# Patient Record
Sex: Male | Born: 1935 | Race: White | Hispanic: No | Marital: Married | Smoking: Former smoker
Health system: Southern US, Community
[De-identification: ages and names within clinical notes are randomized; demographics above are authoritative.]

## PROBLEM LIST (undated history)

## (undated) DIAGNOSIS — K56609 Unspecified intestinal obstruction, unspecified as to partial versus complete obstruction: Secondary | ICD-10-CM

## (undated) DIAGNOSIS — F329 Major depressive disorder, single episode, unspecified: Secondary | ICD-10-CM

## (undated) DIAGNOSIS — F32A Depression, unspecified: Secondary | ICD-10-CM

## (undated) DIAGNOSIS — F0390 Unspecified dementia without behavioral disturbance: Secondary | ICD-10-CM

## (undated) DIAGNOSIS — G20 Parkinson's disease: Secondary | ICD-10-CM

## (undated) DIAGNOSIS — A419 Sepsis, unspecified organism: Secondary | ICD-10-CM

## (undated) DIAGNOSIS — Z7409 Other reduced mobility: Secondary | ICD-10-CM

## (undated) DIAGNOSIS — Z85038 Personal history of other malignant neoplasm of large intestine: Secondary | ICD-10-CM

## (undated) DIAGNOSIS — M1990 Unspecified osteoarthritis, unspecified site: Secondary | ICD-10-CM

## (undated) HISTORY — PX: CATARACT EXTRACTION, BILATERAL: SHX1313

## (undated) HISTORY — PX: HERNIA REPAIR: SHX51

## (undated) HISTORY — PX: OTHER SURGICAL HISTORY: SHX169

## (undated) HISTORY — PX: VASECTOMY: SHX75

## (undated) HISTORY — PX: LUMBAR FUSION: SHX111

## (undated) HISTORY — DX: Personal history of other malignant neoplasm of large intestine: Z85.038

## (undated) HISTORY — PX: TOTAL SHOULDER ARTHROPLASTY: SHX126

## (undated) HISTORY — PX: BACK SURGERY: SHX140

## (undated) HISTORY — PX: SPINAL CORD STIMULATOR IMPLANT: SHX2422

---

## 2004-03-04 ENCOUNTER — Ambulatory Visit (HOSPITAL_COMMUNITY): Admission: RE | Admit: 2004-03-04 | Discharge: 2004-03-04 | Payer: Self-pay | Admitting: Gastroenterology

## 2004-08-09 ENCOUNTER — Ambulatory Visit (HOSPITAL_COMMUNITY): Admission: RE | Admit: 2004-08-09 | Discharge: 2004-08-09 | Payer: Self-pay | Admitting: Chiropractic Medicine

## 2004-10-27 ENCOUNTER — Inpatient Hospital Stay (HOSPITAL_COMMUNITY): Admission: RE | Admit: 2004-10-27 | Discharge: 2004-11-01 | Payer: Self-pay | Admitting: Orthopaedic Surgery

## 2007-07-30 ENCOUNTER — Encounter: Admission: RE | Admit: 2007-07-30 | Discharge: 2007-07-30 | Payer: Self-pay | Admitting: Cardiovascular Disease

## 2007-08-02 ENCOUNTER — Ambulatory Visit (HOSPITAL_COMMUNITY): Admission: RE | Admit: 2007-08-02 | Discharge: 2007-08-02 | Payer: Self-pay | Admitting: Cardiovascular Disease

## 2007-11-05 ENCOUNTER — Inpatient Hospital Stay (HOSPITAL_COMMUNITY): Admission: EM | Admit: 2007-11-05 | Discharge: 2007-11-09 | Payer: Self-pay | Admitting: Emergency Medicine

## 2007-11-09 ENCOUNTER — Inpatient Hospital Stay (HOSPITAL_COMMUNITY)
Admission: RE | Admit: 2007-11-09 | Discharge: 2007-11-23 | Payer: Self-pay | Admitting: Physical Medicine & Rehabilitation

## 2007-11-09 ENCOUNTER — Ambulatory Visit: Payer: Self-pay | Admitting: Physical Medicine & Rehabilitation

## 2008-12-03 ENCOUNTER — Inpatient Hospital Stay (HOSPITAL_COMMUNITY): Admission: RE | Admit: 2008-12-03 | Discharge: 2008-12-05 | Payer: Self-pay | Admitting: Neurosurgery

## 2008-12-10 ENCOUNTER — Inpatient Hospital Stay (HOSPITAL_COMMUNITY): Admission: RE | Admit: 2008-12-10 | Discharge: 2008-12-10 | Payer: Self-pay | Admitting: Neurosurgery

## 2009-10-30 ENCOUNTER — Encounter: Admission: RE | Admit: 2009-10-30 | Discharge: 2009-10-30 | Payer: Self-pay | Admitting: Neurosurgery

## 2009-11-26 ENCOUNTER — Encounter: Admission: RE | Admit: 2009-11-26 | Discharge: 2009-12-15 | Payer: Self-pay | Admitting: Neurosurgery

## 2010-05-18 ENCOUNTER — Encounter
Admission: RE | Admit: 2010-05-18 | Discharge: 2010-07-02 | Payer: Self-pay | Source: Home / Self Care | Attending: Neurosurgery | Admitting: Neurosurgery

## 2010-07-06 ENCOUNTER — Encounter
Admission: RE | Admit: 2010-07-06 | Discharge: 2010-08-03 | Payer: Self-pay | Source: Home / Self Care | Attending: Neurosurgery | Admitting: Neurosurgery

## 2010-07-22 ENCOUNTER — Encounter: Admit: 2010-07-22 | Payer: Self-pay | Admitting: Neurosurgery

## 2010-08-05 ENCOUNTER — Ambulatory Visit: Payer: Medicare Other | Attending: Neurosurgery | Admitting: Physical Therapy

## 2010-08-05 DIAGNOSIS — M545 Low back pain, unspecified: Secondary | ICD-10-CM | POA: Insufficient documentation

## 2010-08-05 DIAGNOSIS — R262 Difficulty in walking, not elsewhere classified: Secondary | ICD-10-CM | POA: Insufficient documentation

## 2010-08-05 DIAGNOSIS — IMO0001 Reserved for inherently not codable concepts without codable children: Secondary | ICD-10-CM | POA: Insufficient documentation

## 2010-08-05 DIAGNOSIS — M6281 Muscle weakness (generalized): Secondary | ICD-10-CM | POA: Insufficient documentation

## 2010-08-09 ENCOUNTER — Encounter: Payer: PRIVATE HEALTH INSURANCE | Admitting: Physical Therapy

## 2010-08-11 ENCOUNTER — Encounter: Payer: PRIVATE HEALTH INSURANCE | Admitting: Physical Therapy

## 2010-08-16 ENCOUNTER — Encounter: Payer: PRIVATE HEALTH INSURANCE | Admitting: Physical Therapy

## 2010-08-18 ENCOUNTER — Encounter: Payer: PRIVATE HEALTH INSURANCE | Admitting: Physical Therapy

## 2010-08-23 ENCOUNTER — Encounter: Payer: PRIVATE HEALTH INSURANCE | Admitting: Physical Therapy

## 2010-08-25 ENCOUNTER — Encounter: Payer: PRIVATE HEALTH INSURANCE | Admitting: Physical Therapy

## 2010-10-11 LAB — CBC
HCT: 35.9 % — ABNORMAL LOW (ref 39.0–52.0)
Hemoglobin: 11.9 g/dL — ABNORMAL LOW (ref 13.0–17.0)
MCHC: 33.2 g/dL (ref 30.0–36.0)
MCV: 93.4 fL (ref 78.0–100.0)
RBC: 3.84 MIL/uL — ABNORMAL LOW (ref 4.22–5.81)

## 2010-10-11 LAB — BASIC METABOLIC PANEL
CO2: 26 mEq/L (ref 19–32)
Chloride: 104 mEq/L (ref 96–112)
GFR calc Af Amer: >60 mL/min (ref >60)
Potassium: 4.1 mEq/L (ref 3.5–5.1)
Sodium: 137 mEq/L (ref 135–145)

## 2010-10-12 LAB — DIFFERENTIAL
Basophils Absolute: 0 10*3/uL (ref 0.0–0.1)
Basophils Relative: 0 % (ref 0–1)
Eosinophils Absolute: 0 10*3/uL (ref 0.0–0.7)
Eosinophils Relative: 1 % (ref 0–5)
Lymphocytes Relative: 17 % (ref 12–46)
Lymphs Abs: 0.9 10*3/uL (ref 0.7–4.0)
Monocytes Absolute: 0.3 10*3/uL (ref 0.1–1.0)
Monocytes Relative: 6 % (ref 3–12)
Neutro Abs: 4 10*3/uL (ref 1.7–7.7)
Neutrophils Relative %: 77 % (ref 43–77)

## 2010-10-12 LAB — CBC
HCT: 37.1 % — ABNORMAL LOW (ref 39.0–52.0)
Hemoglobin: 12.7 g/dL — ABNORMAL LOW (ref 13.0–17.0)
MCHC: 34.2 g/dL (ref 30.0–36.0)
MCV: 93.5 fL (ref 78.0–100.0)
Platelets: 158 10*3/uL (ref 150–400)
RBC: 3.97 MIL/uL — ABNORMAL LOW (ref 4.22–5.81)
RDW: 14.2 % (ref 11.5–15.5)
WBC: 5.2 10*3/uL (ref 4.0–10.5)

## 2010-10-12 LAB — PROTIME-INR
INR: 1.1 (ref 0.00–1.49)
Prothrombin Time: 14.1 seconds (ref 11.6–15.2)

## 2010-10-12 LAB — COMPREHENSIVE METABOLIC PANEL
ALT: 15 U/L (ref 0–53)
AST: 22 U/L (ref 0–37)
Albumin: 4.1 g/dL (ref 3.5–5.2)
Alkaline Phosphatase: 92 U/L (ref 39–117)
BUN: 18 mg/dL (ref 6–23)
CO2: 30 mEq/L (ref 19–32)
Calcium: 9.4 mg/dL (ref 8.4–10.5)
Chloride: 103 mEq/L (ref 96–112)
Creatinine, Ser: 0.87 mg/dL (ref 0.4–1.5)
GFR calc Af Amer: >60 mL/min (ref >60)
GFR calc non Af Amer: >60 mL/min (ref >60)
Glucose, Bld: 102 mg/dL — ABNORMAL HIGH (ref 70–99)
Potassium: 4.6 mEq/L (ref 3.5–5.1)
Sodium: 139 mEq/L (ref 135–145)
Total Bilirubin: 0.8 mg/dL (ref 0.3–1.2)
Total Protein: 6.6 g/dL (ref 6.0–8.3)

## 2010-10-12 LAB — URINALYSIS, ROUTINE W REFLEX MICROSCOPIC
Glucose, UA: NEGATIVE mg/dL
Ketones, ur: NEGATIVE mg/dL
Nitrite: NEGATIVE
Protein, ur: NEGATIVE mg/dL
Urobilinogen, UA: 1 mg/dL (ref 0.0–1.0)

## 2010-10-12 LAB — URINE MICROSCOPIC-ADD ON

## 2010-10-12 LAB — APTT: aPTT: 40 seconds — ABNORMAL HIGH (ref 24–37)

## 2010-10-20 ENCOUNTER — Ambulatory Visit: Payer: Medicare Other | Attending: Neurosurgery

## 2010-10-20 DIAGNOSIS — M545 Low back pain, unspecified: Secondary | ICD-10-CM | POA: Insufficient documentation

## 2010-10-20 DIAGNOSIS — IMO0001 Reserved for inherently not codable concepts without codable children: Secondary | ICD-10-CM | POA: Insufficient documentation

## 2010-10-20 DIAGNOSIS — M6281 Muscle weakness (generalized): Secondary | ICD-10-CM | POA: Insufficient documentation

## 2010-10-20 DIAGNOSIS — R262 Difficulty in walking, not elsewhere classified: Secondary | ICD-10-CM | POA: Insufficient documentation

## 2010-10-28 ENCOUNTER — Ambulatory Visit: Payer: Medicare Other | Admitting: Physical Therapy

## 2010-11-02 ENCOUNTER — Ambulatory Visit: Payer: Medicare Other | Attending: Neurosurgery | Admitting: Physical Therapy

## 2010-11-02 DIAGNOSIS — M545 Low back pain, unspecified: Secondary | ICD-10-CM | POA: Insufficient documentation

## 2010-11-02 DIAGNOSIS — M6281 Muscle weakness (generalized): Secondary | ICD-10-CM | POA: Insufficient documentation

## 2010-11-02 DIAGNOSIS — R262 Difficulty in walking, not elsewhere classified: Secondary | ICD-10-CM | POA: Insufficient documentation

## 2010-11-02 DIAGNOSIS — IMO0001 Reserved for inherently not codable concepts without codable children: Secondary | ICD-10-CM | POA: Insufficient documentation

## 2010-11-04 ENCOUNTER — Ambulatory Visit: Payer: Medicare Other

## 2010-11-09 ENCOUNTER — Ambulatory Visit: Payer: Medicare Other | Admitting: Physical Therapy

## 2010-11-11 ENCOUNTER — Ambulatory Visit: Payer: Medicare Other

## 2010-11-16 ENCOUNTER — Ambulatory Visit: Payer: Medicare Other | Admitting: Physical Therapy

## 2010-11-16 NOTE — H&P (Signed)
NAMETWAIN, STENSETH NO.:  [id number]   MEDICAL RECORD NO.:  [id number]          PATIENT TYPE:  IPS   LOCATION:  4003                         FACILITY:  [facility]   PHYSICIAN:  Ranelle Oyster, M.D.DATE OF BIRTH:  Feb 27, 1924   DATE OF ADMISSION:  11/09/2007  DATE OF DISCHARGE:                              HISTORY & PHYSICAL   CHIEF COMPLAINTS:  1. Weakness.  2. Gait disorder.   HISTORY OF PRESENT ILLNESS:  This is an 77 year old white male with  recent lumbar spine surgery in March 2009.  The patient was  postoperative right lower extremity neuropathy and questionable nerve  root irritation.  Per the patient's account, it was with L5 nerve root.  The patient was discharged to home and initially was ambulating, but  developed orthostatic hypotension with falls, and was placed ProAmatine  by his primary physician.  On Nov 05, 2007, he was admitted with weakness  and questionable UTI with an episode of hematuria on admission.  MRI of  the lumbar spine was unhelpful as multiple artifact was noted from L1-  L5.  Urine culture was negative.  The patient continues with some  weakness and decreased balance and he is admitted to the rehab unit  today.   REVIEW OF SYSTEMS:  Notable for mild insomnia as well as depression.  Other pertinent positives as above and full reviews in a written H and  P.   PAST MEDICAL HISTORY:  1. Positive for colon cancer with resection in the 1980s.  2. Left total shoulder replacement and right shoulder degenerative      changes with decreased range of motion, and potential shoulder      replacement in the future planned.  3. Lumbar decompression in 1979, 1980, and 2006.  4. Lumbar fusion on September 12, 2007, at [place].  5. Depression.  6. Anxiety.  7. History of frequent UTI.   FAMILY HISTORY:  Positive for CAD.   SOCIAL HISTORY:  The patient is married, lives in one-level house, one  step to enter.  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  He does not smoke or drink.  Mobility has been decreased  overall since January of this year.  Wife has been assisting.   FUNCTIONAL HISTORY:  The patient was independent prior to arrival.  Currently is requiring min-to-mod assist for basic mobility and self  care.   ALLERGIES:  1. FENTANYL.  2. PHENERGAN.   HOME MEDICATIONS:  1. Hydrocodone.  2. Keflex.  3. Cymbalta.  4. Colace.  5. Lipitor.  6. Zinc.   LABORATORY DATA:  Hemoglobin 12, white count 4.3, and platelets 165,000.  Sodium 137, potassium 4.3, and BUN and creatinine 10 and 0.71.   PHYSICAL EXAMINATION:  Blood pressure is 163/78, pulse 75, respiratory  rate 20, and temperature 97.5.  The patient is pleasant, alert, and oriented x3.  Pupils are equal, round, and reactive to light.  EAR, NOSE, AND THROAT EXAM: Unremarkable with fair dentition and pink  moist mucosa.  NECK: Supple without JVD or lymphadenopathy.  CHEST: Clear to  auscultation bilaterally without wheezes, rales, or  rhonchi.  HEART: Regular rate and rhythm without murmur, rubs, or gallops.  ABDOMEN: Soft and nontender.  Bowel sounds are positive.  SKIN: Notable for an old abdominal scar.  He has multiple scars from his  recent back surgery in the left back and flank, over the left lateral  pelvis.  NEUROLOGIC: Cranial nerves II through XII are intact.  Reflexes are 1+  in both legs.  Sensation may have been decreased distally in both legs,  however, the L4-L5 areas although did seem to be somewhat variable  today.  Strength was generally 4-5/5 in the upper extremities, except at  the shoulders which were inhibited due to the pain from his arthritis  and problems related to the shoulder replacement.  Lower extremity  strength is 2+ to 3 out of 5 proximal and 4/5 distal.  I saw no specific  focal weakness.  He did have some pain in both quadriceps with palpation  but had preserved active movement of both knees.  Judgment,  orientation,  memory, and mood were all within functional limits.   ASSESSMENT:  1. Functional deficits secondary to lower extremity weakness and pain      as related to the lumbar spondylosis.  The patient is status post      multilevel fusion.  Gait problems, most likely are multifactorial      related to the above as well as deconditioning; poor posture; and      poor orthostasis, etc.  Begin rehab today as the patient's physical      and medical needs cannot be provided at a lesser intensity of the      service.  Physical therapy will assess and treat for range of      motion, strength, balance, and gait.  Occupational therapy will      assess and treat for range of motion, self care, and appropriate      equipment.  Rehab nurse will follow on a 24-hour basis for bowel,      bladder, skin safety, and medications.  Rehab case manager/social      worker assessed for psychosocial needs and discharge planning.      Estimated length of stay is 10-14 days.  Goal supervision to      modified independent.  2. Pain management with p.r.n. Vicodin and/or Tylenol.  3. Deep venous thrombosis prophylaxis, subcutaneous Lovenox.  4. Insomnia:  Low-dose trazodone nightly.  5. Mood:  Continue Cymbalta 60 mg p.o. daily.  6. Orthostasis:  Check the blood pressure and pulse daily x3.  7. Genitourinary:  Observe urine output.  The patient has Foley      discontinued this afternoon.  We will monitor for output and      catheterization as needed.       Ranelle Oyster, M.D.  Electronically Signed     ZTS/MEDQ  D:  11/09/2007  T:  11/10/2007  Job:  [id number]

## 2010-11-16 NOTE — H&P (Signed)
[name], SGRO NO.:  [id number]   MEDICAL RECORD NO.:  [id number]          PATIENT TYPE:  INP   LOCATION:  3001                         FACILITY:  [facility]   PHYSICIAN:  Payton Doughty, M.D.      DATE OF BIRTH:  10-06-1923   DATE OF ADMISSION:  12/10/2008  DATE OF DISCHARGE:  12/10/2008                              HISTORY & PHYSICAL   ADMITTING DIAGNOSIS:  Implanted spinal cord stimulator.   DICTATING DOCTOR:  Payton Doughty, M.D., Neurosurgery.   BODY TEXT:  A very nice 77 year old right-handed white gentleman who  last week underwent implantation of his spinal cord stimulator trial.  He has had a prior L1-L5 fusion, due Kenalog obtained in his right leg.  He elected to proceed with spinal cord stimulator.  His implant has got  good coverage.  He is admitted now for battery implant.   MEDICAL HISTORY:  Remarkable for good health.   MEDICATIONS:  1. Cymbalta.  2. MiraLax.  3. Zaleplon.  4. Hydrocodone.   ALLERGIES:  HE IS ALLERGIC TO FENTANYL PATCHES, PHENERGAN, OXYCODONE,  AND, NEURONTIN.   SURGICAL HISTORY:  Strabismus, vasectomy, frozen shoulder, laminectomy,  total shoulder lumbar fusion and a large fusion in 2009.   SOCIAL HISTORY:  He does not smoke or drink.  He is a Musician.   FAMILY HISTORY:  Mom died at 7 with hypertension and stroke.  Dad died  at 78 with cardiomegaly and 7 brothers have died, one with ALS, one with  lung cancer, one with brain cancer, and four with heart disease.   REVIEW OF SYSTEMS:  Remarkable for glasses, constipation from pain  medication, colon cancer, arm weakness, leg weakness, back pain, arm  pain, leg pain, arthritis, neck pain, skin cancer, anxiety, and  depression.   PHYSICAL EXAMINATION:  HEENT:  Within normal limits.  NECK:  He has good range of motion of his neck.  CHEST:  Clear.  CARDIAC:  Regular rate and rhythm.  ABDOMEN:  Nontender.  No hepatosplenomegaly.  EXTREMITIES:  Without clubbing or  cyanosis.  Peripheral pulses are good.  GU:  Deferred.  NEUROLOGIC:  He is awake, alert, and oriented.  His cranial nerves are  intact.  Motor exam shows 5/5 strength throughout the upper extremities,  described as somewhat frozen shoulder.  Lower extremity strength is good  on the left than the right side.  He has 3/5 dorsiflexion of the right  foot.  Reflexes were absent at the knee, absent at the right ankle, and  one at the left ankle.  He has got an implanted stimulator in place.   CLINICAL IMPRESSION:  Implanted spinal cord stimulator, trial for  battery implant.   The risks and benefits have been discussed with him, and he wished to  proceed.           ______________________________  Payton Doughty, M.D.     MWR/MEDQ  D:  12/10/2008  T:  12/11/2008  Job:  [id number]

## 2010-11-16 NOTE — H&P (Signed)
NAMECELESTER, LECH NO.:  [id number]   MEDICAL RECORD NO.:  [id number]          PATIENT TYPE:  INP   LOCATION:  3027                         FACILITY:  [facility]   PHYSICIAN:  Payton Doughty, M.D.      DATE OF BIRTH:  [birth year]   DATE OF ADMISSION:  12/03/2008  DATE OF DISCHARGE:                              HISTORY & PHYSICAL   ADMISSION DIAGNOSIS:  Intractable back and right lower extremity pain.   BODY OF THE TEXT:  A very nice self-referred 77 year old right-handed  white gentleman, in the last March 2009 had a fusion from L1 to L5 at  [facility] and an L4-5 ALIF, L1-L2 and L2-3 XLIF and pedicle screws joining  it.  Since the time of operation, he had a lot of pain in his right leg.  His back bothers him some.  His right pain is most troublesome.  He has  a bit of foot drop with it.  He has had a couple of myelogram since then  and difficulty with pain in his right leg.   PAST MEDICAL HISTORY:  Marked for good health.   Uses Cymbalta, MiraLax, zinc, Zypan, and hydrocodone.   ALLERGIES:  DURAGESIC PATCHES, PHENERGAN, OXYCODONE, and NEURONTIN.   PAST SURGICAL HISTORY:  1. Strabismus in 1958.  2. Hernia in 1971.  3. Mastectomy in 1972.  4. Frozen shoulder in 1972.  5. Laminectomy in 1979.  6. Laminectomy in 1980.  7. Total shoulder in 2000.  8. Lumbar fusion in 2000.  9. Large fusion in 2009.   SOCIAL HISTORY:  He does not smoke and drinks encroachment.   FAMILY HISTORY:  Mother died at 65 of hypertension, stroke.  Father died  at 59 of cardiomegaly.  He has 7 brothers who have died, one with ALS,  one with lung cancer, one with brain cancer, and fourth heart disease.   REVIEW OF SYSTEMS:  Marked for wearing glasses, constipation from his  pain meds, colon cancer, arm weakness, leg weakness, back pain, arm  pain, leg pain, arthritis, neck pain, skin cancer, anxiety, depression,  and he has had a transfusion.   PHYSICAL EXAMINATION:  HEENT:  Within normal  limits.  NECK:  He has a pretty good range of motion of his neck.  CHEST:  Clear.  CARDIAC:  Regular rate and rhythm at this time.  ABDOMEN:  Nontender.  No hepatosplenomegaly.  EXTREMITIES:  Without clubbing or cyanosis.  GENITOURINARY:  Deferred.  Peripheral pulses are good.  NEUROLOGIC:  He is awake, alert, and oriented.  His cranial nerves are  intact.  Motor exam shows 5/5 strength throughout the upper extremities  by somewhat frozen shoulder.  Lower extremity strength is good on the  left.  On the right side, he has a 3/5 dorsiflexion of the right foot.  Reflexes are absent at the knee, absence at the right ankle, 1 at the  left ankle.   Post-myelogram CT from last November shows reasonable filling lateral  recess narrowing at 4-5 on the right side underneath the side of one of  his old laminectomies, medial  to the side of the pedicle screw in the L5  pedicle.   CLINICAL IMPRESSION:  Right L5 radiculopathy.  We had a long discussion  about decompression of the nerve root, as well as a spinal cord  stimulator for pain management.  He has elected to pursue spinal cord  stimulator.   The plan is to place it at roughly to T10 level and make sure have  coverage to the right side.  The risks and benefits, and progression  have been discussed with him, and he wished to proceed.           ______________________________  Payton Doughty, M.D.     MWR/MEDQ  D:  12/03/2008  T:  12/03/2008  Job:  [id number]

## 2010-11-16 NOTE — Op Note (Signed)
NAMEQUANTRELL, SPLITT NO.:  [id number]   MEDICAL RECORD NO.:  [id number]          PATIENT TYPE:  INP   LOCATION:  3001                         FACILITY:  [facility]   PHYSICIAN:  Payton Doughty, M.D.      DATE OF BIRTH:  Sep 17, 1923   DATE OF PROCEDURE:  12/10/2008  DATE OF DISCHARGE:  12/10/2008                               OPERATIVE REPORT   PREOPERATIVE DIAGNOSIS:  Implanted spinal cord stimulator.   POSTOPERATIVE DIAGNOSIS:  Implanted spinal cord stimulator.   PROCEDURES:  Implant spinal cord stimulator battery.   SURGEON:  Payton Doughty, MD   ANESTHESIA:  General endotracheal.   PREPARATION:  Prepped and draped with alcohol wipe.   COMPLICATIONS:  None.   ASSISTANT:  Bedelia Person, MD   DOCTOR ASSISTANTS:  None.   BODY OF TEXT:  An 77 year old gentleman with intractable back and right  leg pain, had a successful spinal cord trial who is now admitted for  final implantation of battery, taken to operating room and smoothly  anesthetized and intubated, placed prone on the operating table  following shave, prep, and drape in usual sterile fashion.  Old skin  incision was reopened.  The wire was mobilized.  The extension wires  detached, divided and removed.  Just below the right posterior spine,  slightly lateral to it a subcutaneous pouch was created through the skin  incision.  The tunnel was created between 2 incisions and wires passed.  The ion battery was connected to the spinal cord stimulating wires and  implanted in the subcutaneous pocket.  Impedance testing revealed low  impedance, both incisions were irrigated.  They were closed successive  layers of 0 Vicryl, 2-0 Vicryl, 3-0 nylon.  Betadine Telfa dressing was  applied, made occlusive with OpSite.  The patient returned to recovery  room in good condition.           ______________________________  Payton Doughty, M.D.     MWR/MEDQ  D:  12/10/2008  T:  12/11/2008  Job:  [id number]

## 2010-11-16 NOTE — H&P (Signed)
NAMEJHOVANI, Paul Callahan                ACCOUNT NO.:  [id number]   MEDICAL RECORD NO.:  [id number]         PATIENT TYPE:  LINP   LOCATION:                               [facility]ACILITY:  [facility]   PHYSICIAN:  Ladell Pier, M.D.   DATE OF BIRTH:  1924-01-18   DATE OF ADMISSION:  11/05/2007  DATE OF DISCHARGE:                              HISTORY & PHYSICAL   CHIEF COMPLAINT:  Is weakness and multiple falls at home.   HISTORY OF PRESENT ILLNESS:  The patient is a 76 year old white male  that recently has had multiple falls at home.  He recently had spinal  surgery on March 11 at [facility], and per his wife, he had spacers that were  placed down the spine and redo of the previous surgery that was done in  2006.  When he followed up on April 21, he had some pain that was going  on his right leg and was told that it was probably nerve irritation and  should get better.  But since then, he has had some weakness, and he is  scheduled to get an MRI on May 29.  He has had several episodes where he  has fallen without any loss of consciousness, just his legs giving out  at home recently, and he had some hematuria.  He was treated for urinary  tract infection twice, but the symptoms seem to be persistent.  He also  was evaluated by primary care doctor and was thought that the episodes  could be secondary to orthostatic hypotension; that was on March 30.  He  was placed on midodrine for a week and then that was discontinued.  Then, later this week, he has been having more episodes of frequent  falls.  Wife think it is probably related to a bladder infection, but he  has been treated twice for bladder infection.   PAST MEDICAL HISTORY:  Significant for:  1. Back surgery at [facility] September 12, 2007.  2. Back surgery at [facility] where he had anterior L3-L4 laminectomy      with posterior spinal fusion and pedicle screws in April 2006.  3. Cardiac catheterization that was negative in January 2009.  4. History of  anxiety, depression.  5. Colon resection secondary to colon cancer.  6. Back surgery 1979 and 1980.  7. Total shoulder replacement in 2002.  8. Vasectomy in 1988.  9. Multiple recent UTIs.  10.History of orthostatic hypotension recently.   FAMILY HISTORY:  Noncontributory.   SOCIAL HISTORY:  The patient is married, lives with his wife.  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  He has three children.   MEDICATIONS:  1. Hydrocodone/acetaminophen 3 times a day as needed.  2. Cephalexin 500 mg 3 times a day.  3. Colace 100 mg daily.  4. Cymbalta 60 mg daily.  5. Lipitor 20 mg every night.   ALLERGIES:  FENTANYL AND PHENERGAN.   REVIEW OF SYSTEMS:  Negative, otherwise as in HPI.   PHYSICAL EXAMINATION:  Temperature 98.5, blood pressure 109/66, pulse of  83, respirations 20, pulse  oximetry 97 on room air.  HEENT:  Normocephalic, atraumatic.  Pupils reactive to light.  Throat  without erythema.  CARDIOVASCULAR:  Regular rhythm.  LUNGS:  Clear bilaterally.  ABDOMEN:  Soft, nontender, nondistended.  Positive bowel sounds.  EXTREMITIES:  Without edema.  The left seem to be larger than the right  slightly.  He does have 2 beats of clonus on the left.  Otherwise, his neurological  exam is nonfocal   LABORATORY DATA:  WBC 4.3, hemoglobin 12, MCV of 91.7, platelets of 165.  UA negative, trace leukocyte.  Sodium 137, potassium 4.3, chloride 103,  CO2 29, glucose 97, BUN 10, creatinine 0.71, calcium 9.2.   ASSESSMENT/PLAN:  Weakness, multiple falls.  The patient is probably  having falls secondary to nerve compression in the back with his recent  back surgery.  Everything else seems fine.  He does not have a white  count.  He does not have any electrolyte abnormalities.  Urine seems  fine.  Will get an MRI of the C spine and LS spine and will also check  orthostatic.  Will check a D-dimer to rule out any sort of PE or DVT,  with his swelling more increased on the left side,  although wife states  that he has been having swelling on the right side.  Will do oxycodone  or Dilaudid p.r.n. for pain.  Continue him on all his other home  medications.      Ladell Pier, M.D.  Electronically Signed     NJ/MEDQ  D:  11/05/2007  T:  11/05/2007  Job:  [id number]

## 2010-11-16 NOTE — Cardiovascular Report (Signed)
NAMEJERMARCUS, Paul Callahan NO.:  [id number]   MEDICAL RECORD NO.:  [id number]          PATIENT TYPE:  OIB   LOCATION:  2899                         FACILITY:  [facility]   PHYSICIAN:  Nanetta Batty, M.D.   DATE OF BIRTH:  1924/01/29   DATE OF PROCEDURE:  DATE OF DISCHARGE:                            CARDIAC CATHETERIZATION   Paul Callahan is an 77 year old married white male, history of non-critical CAD  by cath at [facility] in [place] 3 years ago.  He had  normal Cardiolite March 18, 2004.  His other problems include  hyperlipidemia.  He denies chest pain, shortness of breath.  Does have  degenerative disk disease and is scheduled for back surgery at [facility].  A  Myoview stress test performed for preoperative clearance suggested new  ischemia in the RCA distribution.  He presents now for outpatient  diagnostic coronary arteriography, to define his anatomy and rule out  ischemic etiology with risk stratifying.   DESCRIPTION OF PROCEDURE:  The patient brought to the second floor [facility]  of cardiac cath lab in the postabsorptive state.  He was pre-medicated  with p.o. Valium.   His right groin was prepped and shaved in the usual sterile fashion.  Xylocaine 1% was used for local anesthesia.  A 6-French sheath was  inserted to the right femoral artery, using standard Seldinger  technique.  A 6-French right and left Judkins' diagnostic catheter, as  well as a [place] pigtail catheter were used for selective coronary  angiography, left ventriculography respectively.  Visipaque dye was used  for the entirety of the case.  Retrograde aorta, ventricular and  pullback pressures were recorded.   HEMODYNAMIC RESULTS:  1. Aortic systolic pressure 172, diastolic pressure 70.  2. Left ventricular systolic pressure 177, end-diastolic pressure 16.      There is no apparent pullback gradient noted.   SELECTIVE CORONARY ANGIOGRAPHY:  1. Left main normal.  2. LAD normal.  3. Left  circumflex is normal.  4. Ramus branch was normal.  5. Right coronary artery is dominant normal.   LEFT VENTRICULOGRAPHY:  RAO left ventriculogram was performed using 25  mL of Visipaque dye at 12 mL per second.  The overall LVEF was estimated  greater than 60%, without focal wall motion abnormalities.   IMPRESSION:  Paul Callahan has essentially normal coronary arteries and normal LV  function.  I believe his Myoview was false positive.  He is cleared for  his upcoming neurosurgical procedure at low cardiovascular risk.   Sheath was removed, and pressure was held to the groin to achieve  hemostasis.  The patient left the lab in stable condition.  He will be  discharged home later today as an outpatient.  I will see him back in  the office in one week for followup.      Nanetta Batty, M.D.  Electronically Signed     JB/MEDQ  D:  08/02/2007  T:  08/02/2007  Job:  [id number]   cc:   Patient Chart  2nd Floor [facility]  [facility]  Frederica Kuster, MD

## 2010-11-16 NOTE — Discharge Summary (Signed)
NAMESLAYTON, LUBITZ                ACCOUNT NO.:  [id number]   MEDICAL RECORD NO.:  [id number]          PATIENT TYPE:  INP   LOCATION:  1445                         FACILITY:  [facility]   PHYSICIAN:  Mobolaji B. Bakare, M.D.DATE OF BIRTH:  03-Jan-1924   DATE OF ADMISSION:  11/05/2007  DATE OF DISCHARGE:  11/09/2007                               DISCHARGE SUMMARY   PRIMARY CARE PHYSICIAN:  Unassigned.   FINAL DIAGNOSES:  1. Bilateral lower extremity weakness.  2. Status post spinal surgery (L1 through L5 fusion at [facility] in March 2009).  3. Frequent falls.   SECONDARY DIAGNOSES:  1. History of anxiety.  2. History of depression.  3. Colon resection for colon cancer.  4. Multiple back surgeries.   PROCEDURES:  1. Chest x-ray done on Nov 05, 2007 showed no acute cardiopulmonary      process, hyperinflation suggesting.  COPD.  2. MRI of the lumbar spine showed fusion of L1 to 5 with metallic      artifact limiting evaluation.  Spinal stenosis most notable at L2-      3.   BRIEF HISTORY:  Please refer to the admission H&P for full details.  In  brief, Mr. Slawinski is an 77 year old Caucasian male with history of  recent spinal surgery on September 12, 2007, at [facility].  He  presented to the emergency room with weakness and multiple falls at  home.  The patient apparently has had evaluation in the outpatient  setting by his primary care physician for falls.  He appeared to have  had some orthostatic hypotension and was given midodrine for about a  week and this has since been discontinued.  The patient was not  orthostatic during this hospitalization.  He had a nonfocal neurological  examination at the time of admission.   Laboratory data showed a normal white cell count.  His chemistry was  normal.  Chest x-ray was unremarkable.   HOSPITAL COURSE:  The patient was admitted for further evaluation.  MRI  of the lumbar spine was unremarkable for acute  abnormality.  The report  indicated mild to moderate spinal stenosis most notable at L2-3.  The  patient was seen by a physical therapist.  The patient appears to have a  right foot drop and wife states that he has had significant decline in  his gait since 1 week post discharge from [facility].  The  patient was discussed with Dr. Manson Passey at [facility], and he recommended the  patient should follow up him in 1 week post discharge.  The patient  needs intensive physical therapy.  He was seen by inpatient rehab unit  and he is felt to be appropriate for inpatient rehabilitation.  Further  evaluation will be deferred to his primary care physician and surgeons  at [facility] post discharge from inpatient rehabilitation.  It  appears that the recent frequent falls have been occurring post back  surgery.  It is quite possible that he is debilitated post procedure and  needs intensive rehabilitation.  There  is no acute finding on the MRI of  the lumbar spine, no obvious bony destruction or lesion to suggest  metastatic disease.   DISCHARGE MEDICATIONS:  1. Cymbalta 60 mg p.o. daily.  2. Protonix 40 mg p.o. daily.  3. Senokot 1 tablet nightly.  4. Zocor 40 mg nightly.  5. Vicodin 1 tablet p.o. q.6 h. p.r.n..  6. Trazodone 25 mg p.o. nightly p.r.n. sleep.  7. MiraLax 17 g p.o. daily constipation.  8. Zofran 4 mg IV q.8 h. p.r.n. nausea and vomiting.  9. Colace 100 times daily.   FOLLOW-UP INSTRUCTIONS:  1. Follow up with Dr. Manson Passey at [facility] in one week post      discharge from rehab.  2. Follow up with primary care physician.      Mobolaji B. Corky Downs, M.D.  Electronically Signed     MBB/MEDQ  D:  11/09/2007  T:  11/09/2007  Job:  [id number]

## 2010-11-16 NOTE — Op Note (Signed)
Paul Callahan, Paul Callahan NO.:  [id number]   MEDICAL RECORD NO.:  [id number]          PATIENT TYPE:  INP   LOCATION:  3027                         FACILITY:  [facility]   PHYSICIAN:  Payton Doughty, M.D.      DATE OF BIRTH:  30-Nov-1923   DATE OF PROCEDURE:  12/03/2008  DATE OF DISCHARGE:                               OPERATIVE REPORT   PREOPERATIVE DIAGNOSIS:  Intractable back and right leg pain.   POSTOPERATIVE DIAGNOSIS:  Intractable back and right leg pain.   PROCEDURE:  Placement of spinal cord stimulating electrode.   SURGEON:  Payton Doughty, MD   ANESTHESIA:  General endotracheal.   PREPARATION:  Prepped and draped with alcohol wipe.   COMPLICATIONS:  None.   ASSISTANT:  None.   BODY OF TEXT:  An 77 year old with intractable back and right leg pain  taken to the operative room, smoothly anesthetized, intubated, and  placed prone on the operating table.  Following shave, prep, and drape  in the usual sterile fashion, skin was incised from the bottom of T10 to  the bottom of T11.  The lamina of T11 was exposed bilaterally in the  subperiosteal plane.  Intraoperative x-ray confirmed correctness.  Having confirmed correctness level, partial laminectomy at T11 was done  to acquire the posterior epidural space.  Stimulating electrode tripolar  was placed.  It was advanced to the bottom of the T10 interspace and  extended up into the bottom of T9.  This way T10 and the bottom T9  recovered and felt to be appropriate for stimulating.  Anchoring was  undertaken and the extension wires were placed.  They were exited via  separate trocar incision.  Wound was irrigated.  Hemostasis assured.  Successive layers of 0-Vicryl, 2-0 Vicryl, and 3-0 nylon were used to  close.  Betadine and Telfa dressing was applied and made occlusive with  OpSite.  The patient returned to the recovery room in good condition.           ______________________________  Payton Doughty, M.D.     MWR/MEDQ  D:  12/03/2008  T:  12/03/2008  Job:  [id number]

## 2010-11-18 ENCOUNTER — Ambulatory Visit: Payer: Medicare Other | Admitting: Physical Therapy

## 2010-11-19 NOTE — Discharge Summary (Signed)
[name], [name] NO.:  [id number]   MEDICAL RECORD NO.:  [id number]          PATIENT TYPE:  IPS   LOCATION:  4003                         FACILITY:  [facility]   PHYSICIAN:  Paul Callahan, Paul CallahanDATE OF BIRTH:  [birth year]   DATE OF ADMISSION:  11/09/2007  DATE OF DISCHARGE:  11/23/2007                               DISCHARGE SUMMARY   DISCHARGE DIAGNOSES:  1. Lumbar stenosis, past laminectomy with deconditioning.  2. Orthostasis, improved.  3. Situational depression.   HISTORY OF PRESENT ILLNESS:  Paul Callahan is an 77 year old male with  history of recent back surgery on March 11 with postop right lower  extremity neuropathy, secondary to nerve irritation discharged to home  and was ambulating initially, but developed orthostatic hypotension with  falls despite being placed on ProAmatine.  On May 4, he was admitted  with weakness and question of UTI.  The patient was noted to have  episode of hematuria past admission.  MRI of the spine done showed L1-L5  metal artifact with mild-to-moderate stenosis L2, L3.  Urine cultures  done on 5/4 and 5/5, which showed no growth.  Currently, the patient  continues with bilateral lower body weakness with decrease in balance,  noted to have depressed mood.  Rehab consulted for further therapies.   PAST MEDICAL HISTORY:  Significant for colon cancer resection in the  80s, left total shoulder replacement, right shoulder degeneration with  decreased range of motion, lumbar decompression in 1979, 1980, 2006 as  well as recently on 09/2007.  Depression, anxiety, and frequent UTIs.   ALLERGIES:  FENTANYL and PHENERGAN.   FAMILY HISTORY:  Positive for coronary artery disease.   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  Then was noted to have decrease in mobility  secondary to back pain.  He has been using a  walker since then.  His  family is very supportive.   HOSPITAL COURSE:  Paul Callahan was admitted to rehab on Nov 10, 2007,  for inpatient therapies to consist of PT, OT daily.   PAST ADMISSION:  The patient's blood pressures were monitored on b.i.d.  basis.  He was noted to have issues with orthostasis and thigh-high TEDs  as well as abdominal binder was ordered to help with his symptomatology.  Blood pressures by time of discharge are ranging at 120s to 130s,  systolic and 70s to 80s diastolic.  The patient's pain control has been  reasonable on p.r.n. use of Ultram and Vicodin.  He does continue with  complaints of neuropathy in his right lower extremity.  Secondary to  patient's depressed new mood, Dr. Leonides Cave, neuropsych was consulted for  input.  His exam revealed the patient with negative attitude and  discontent regarding recent situation.  Dr. Leonides Cave has been following  the patient along for support during this stay.  By the time of  discharge, the patient reported feeling more progress that he had made  in therapies.  Labs were done in past admission are essentially normal.  CBC done  revealed hemoglobin 4.0, hematocrit 35.8, white count 4.3, platelets  194.  Electrolytes reveals sodium 140, potassium 4.5, chloride 105, CO2  of 31, BUN 17, creatinine 0.7, glucose 109.  A UA UC was rechecked.  UA  was negative without evidence of microhematuria.  Urine culture showed  no growth.  The patient has been afebrile during this stay.  Therapies  have been ongoing and initially, the patient with complaints of  dizziness, which have been resolved.  By the time of discharge, the  patient had progressed along to supervision level for bed mobility and  basic car transfers as well as ambulation treatment 150 feet in  controlled environment.  He still continued with significant right lower  extremity pain and weakness as well as right shoulder pain with  decreased balance limiting his  gait and ambulation tested.  In terms of  self-care, the patient has been assisted supervision for BADL using  Adaptive equipment.  He continued to have difficulty with safety  precautions during self-care.  OT has continued to work on activity  tolerance as well as dynamic standing balance for functional tasks to  help improve his safety awareness secondary to balance issues.  The  patient's 24-hour supervision is recommended prior to discharge.  Further followup home health PT, OT as well as a CNA by advanced home  care set up to continue progressive therapies.  On Nov 23, 2007, the  patient was discharged to home.   DISCHARGE MEDICATIONS:  1. Cymbalta 30 mg 2 p.o. per day.  2. Lipitor 20 mg 1 p.o. nightly.  3. Trazodone 50 mg half p.o. nightly.  4. Prilosec OTC 20 mg 1 per day.  5. Multivitamin once a day.  6. Ultram/hydrocodone to be used as needed p.r.n. pain.   DIET:  Heart healthy.  Drink plenty of fluids.   ACTIVITY LEVEL:  24-hour supervision.  No strenuous activity.  No  alcohol, no smoking or driving.  Special instructions with abdominal  binder and TEDs and out of bed to maintain blood pressure.   FOLLOW UP:  The patient to follow up with Dr. Linward Foster for routine  check.  Follow up with Dr. Erroll Luna for postop check.  Follow  up with Dr. Riley Kill as needed.      Greg Cutter, P.A.      Paul Callahan, M.D.  Electronically Signed    PP/MEDQ  D:  11/28/2007  T:  11/29/2007  Job:  [id number]   cc:   Estelle June. Neva Seat, M.D.

## 2010-11-24 ENCOUNTER — Ambulatory Visit: Payer: Medicare Other | Admitting: Physical Therapy

## 2010-11-25 ENCOUNTER — Ambulatory Visit: Payer: Medicare Other

## 2011-06-18 DIAGNOSIS — R269 Unspecified abnormalities of gait and mobility: Secondary | ICD-10-CM | POA: Diagnosis not present

## 2011-06-18 DIAGNOSIS — I10 Essential (primary) hypertension: Secondary | ICD-10-CM | POA: Diagnosis not present

## 2011-06-18 DIAGNOSIS — G20 Parkinson's disease: Secondary | ICD-10-CM | POA: Diagnosis not present

## 2011-06-18 DIAGNOSIS — M216X9 Other acquired deformities of unspecified foot: Secondary | ICD-10-CM | POA: Diagnosis not present

## 2011-06-20 DIAGNOSIS — G20 Parkinson's disease: Secondary | ICD-10-CM | POA: Diagnosis not present

## 2011-06-20 DIAGNOSIS — R269 Unspecified abnormalities of gait and mobility: Secondary | ICD-10-CM | POA: Diagnosis not present

## 2011-06-20 DIAGNOSIS — I10 Essential (primary) hypertension: Secondary | ICD-10-CM | POA: Diagnosis not present

## 2011-06-20 DIAGNOSIS — M216X9 Other acquired deformities of unspecified foot: Secondary | ICD-10-CM | POA: Diagnosis not present

## 2011-06-21 DIAGNOSIS — R269 Unspecified abnormalities of gait and mobility: Secondary | ICD-10-CM | POA: Diagnosis not present

## 2011-06-21 DIAGNOSIS — I10 Essential (primary) hypertension: Secondary | ICD-10-CM | POA: Diagnosis not present

## 2011-06-21 DIAGNOSIS — M216X9 Other acquired deformities of unspecified foot: Secondary | ICD-10-CM | POA: Diagnosis not present

## 2011-06-21 DIAGNOSIS — G20 Parkinson's disease: Secondary | ICD-10-CM | POA: Diagnosis not present

## 2011-06-23 DIAGNOSIS — R269 Unspecified abnormalities of gait and mobility: Secondary | ICD-10-CM | POA: Diagnosis not present

## 2011-06-23 DIAGNOSIS — M216X9 Other acquired deformities of unspecified foot: Secondary | ICD-10-CM | POA: Diagnosis not present

## 2011-06-23 DIAGNOSIS — I10 Essential (primary) hypertension: Secondary | ICD-10-CM | POA: Diagnosis not present

## 2011-06-23 DIAGNOSIS — G20 Parkinson's disease: Secondary | ICD-10-CM | POA: Diagnosis not present

## 2011-06-24 DIAGNOSIS — G20 Parkinson's disease: Secondary | ICD-10-CM | POA: Diagnosis not present

## 2011-06-24 DIAGNOSIS — M216X9 Other acquired deformities of unspecified foot: Secondary | ICD-10-CM | POA: Diagnosis not present

## 2011-06-24 DIAGNOSIS — R269 Unspecified abnormalities of gait and mobility: Secondary | ICD-10-CM | POA: Diagnosis not present

## 2011-06-24 DIAGNOSIS — I10 Essential (primary) hypertension: Secondary | ICD-10-CM | POA: Diagnosis not present

## 2011-06-27 DIAGNOSIS — R269 Unspecified abnormalities of gait and mobility: Secondary | ICD-10-CM | POA: Diagnosis not present

## 2011-06-27 DIAGNOSIS — I10 Essential (primary) hypertension: Secondary | ICD-10-CM | POA: Diagnosis not present

## 2011-06-27 DIAGNOSIS — M216X9 Other acquired deformities of unspecified foot: Secondary | ICD-10-CM | POA: Diagnosis not present

## 2011-06-27 DIAGNOSIS — G20 Parkinson's disease: Secondary | ICD-10-CM | POA: Diagnosis not present

## 2011-06-29 DIAGNOSIS — M216X9 Other acquired deformities of unspecified foot: Secondary | ICD-10-CM | POA: Diagnosis not present

## 2011-06-29 DIAGNOSIS — G20 Parkinson's disease: Secondary | ICD-10-CM | POA: Diagnosis not present

## 2011-06-29 DIAGNOSIS — I10 Essential (primary) hypertension: Secondary | ICD-10-CM | POA: Diagnosis not present

## 2011-06-29 DIAGNOSIS — R269 Unspecified abnormalities of gait and mobility: Secondary | ICD-10-CM | POA: Diagnosis not present

## 2011-06-30 DIAGNOSIS — G20 Parkinson's disease: Secondary | ICD-10-CM | POA: Diagnosis not present

## 2011-06-30 DIAGNOSIS — I10 Essential (primary) hypertension: Secondary | ICD-10-CM | POA: Diagnosis not present

## 2011-06-30 DIAGNOSIS — M216X9 Other acquired deformities of unspecified foot: Secondary | ICD-10-CM | POA: Diagnosis not present

## 2011-06-30 DIAGNOSIS — R269 Unspecified abnormalities of gait and mobility: Secondary | ICD-10-CM | POA: Diagnosis not present

## 2011-07-01 DIAGNOSIS — G20 Parkinson's disease: Secondary | ICD-10-CM | POA: Diagnosis not present

## 2011-07-01 DIAGNOSIS — R269 Unspecified abnormalities of gait and mobility: Secondary | ICD-10-CM | POA: Diagnosis not present

## 2011-07-01 DIAGNOSIS — I10 Essential (primary) hypertension: Secondary | ICD-10-CM | POA: Diagnosis not present

## 2011-07-01 DIAGNOSIS — M216X9 Other acquired deformities of unspecified foot: Secondary | ICD-10-CM | POA: Diagnosis not present

## 2011-07-04 DIAGNOSIS — R269 Unspecified abnormalities of gait and mobility: Secondary | ICD-10-CM | POA: Diagnosis not present

## 2011-07-04 DIAGNOSIS — M216X9 Other acquired deformities of unspecified foot: Secondary | ICD-10-CM | POA: Diagnosis not present

## 2011-07-04 DIAGNOSIS — I10 Essential (primary) hypertension: Secondary | ICD-10-CM | POA: Diagnosis not present

## 2011-07-04 DIAGNOSIS — G20 Parkinson's disease: Secondary | ICD-10-CM | POA: Diagnosis not present

## 2011-07-06 DIAGNOSIS — G20 Parkinson's disease: Secondary | ICD-10-CM | POA: Diagnosis not present

## 2011-07-06 DIAGNOSIS — I10 Essential (primary) hypertension: Secondary | ICD-10-CM | POA: Diagnosis not present

## 2011-07-06 DIAGNOSIS — R269 Unspecified abnormalities of gait and mobility: Secondary | ICD-10-CM | POA: Diagnosis not present

## 2011-07-06 DIAGNOSIS — M216X9 Other acquired deformities of unspecified foot: Secondary | ICD-10-CM | POA: Diagnosis not present

## 2011-07-07 DIAGNOSIS — I10 Essential (primary) hypertension: Secondary | ICD-10-CM | POA: Diagnosis not present

## 2011-07-07 DIAGNOSIS — R269 Unspecified abnormalities of gait and mobility: Secondary | ICD-10-CM | POA: Diagnosis not present

## 2011-07-07 DIAGNOSIS — M216X9 Other acquired deformities of unspecified foot: Secondary | ICD-10-CM | POA: Diagnosis not present

## 2011-07-07 DIAGNOSIS — G20 Parkinson's disease: Secondary | ICD-10-CM | POA: Diagnosis not present

## 2011-07-08 DIAGNOSIS — M216X9 Other acquired deformities of unspecified foot: Secondary | ICD-10-CM | POA: Diagnosis not present

## 2011-07-08 DIAGNOSIS — I10 Essential (primary) hypertension: Secondary | ICD-10-CM | POA: Diagnosis not present

## 2011-07-08 DIAGNOSIS — G20 Parkinson's disease: Secondary | ICD-10-CM | POA: Diagnosis not present

## 2011-07-08 DIAGNOSIS — R269 Unspecified abnormalities of gait and mobility: Secondary | ICD-10-CM | POA: Diagnosis not present

## 2011-08-30 DIAGNOSIS — F332 Major depressive disorder, recurrent severe without psychotic features: Secondary | ICD-10-CM | POA: Diagnosis not present

## 2011-08-31 DIAGNOSIS — L719 Rosacea, unspecified: Secondary | ICD-10-CM | POA: Diagnosis not present

## 2011-09-09 DIAGNOSIS — G20 Parkinson's disease: Secondary | ICD-10-CM | POA: Diagnosis not present

## 2011-09-22 ENCOUNTER — Ambulatory Visit: Payer: Medicare Other | Attending: Psychiatry | Admitting: Physical Therapy

## 2011-09-22 DIAGNOSIS — R269 Unspecified abnormalities of gait and mobility: Secondary | ICD-10-CM | POA: Diagnosis not present

## 2011-09-22 DIAGNOSIS — R5381 Other malaise: Secondary | ICD-10-CM | POA: Diagnosis not present

## 2011-09-22 DIAGNOSIS — M6281 Muscle weakness (generalized): Secondary | ICD-10-CM | POA: Insufficient documentation

## 2011-09-22 DIAGNOSIS — R293 Abnormal posture: Secondary | ICD-10-CM | POA: Insufficient documentation

## 2011-09-22 DIAGNOSIS — IMO0001 Reserved for inherently not codable concepts without codable children: Secondary | ICD-10-CM | POA: Insufficient documentation

## 2011-09-29 DIAGNOSIS — L821 Other seborrheic keratosis: Secondary | ICD-10-CM | POA: Diagnosis not present

## 2011-09-29 DIAGNOSIS — D239 Other benign neoplasm of skin, unspecified: Secondary | ICD-10-CM | POA: Diagnosis not present

## 2011-09-29 DIAGNOSIS — L570 Actinic keratosis: Secondary | ICD-10-CM | POA: Diagnosis not present

## 2011-10-03 ENCOUNTER — Ambulatory Visit: Payer: Medicare Other | Attending: Psychiatry | Admitting: Physical Therapy

## 2011-10-03 DIAGNOSIS — R269 Unspecified abnormalities of gait and mobility: Secondary | ICD-10-CM | POA: Insufficient documentation

## 2011-10-03 DIAGNOSIS — M6281 Muscle weakness (generalized): Secondary | ICD-10-CM | POA: Diagnosis not present

## 2011-10-03 DIAGNOSIS — R5381 Other malaise: Secondary | ICD-10-CM | POA: Diagnosis not present

## 2011-10-03 DIAGNOSIS — IMO0001 Reserved for inherently not codable concepts without codable children: Secondary | ICD-10-CM | POA: Diagnosis not present

## 2011-10-03 DIAGNOSIS — R293 Abnormal posture: Secondary | ICD-10-CM | POA: Diagnosis not present

## 2011-10-04 ENCOUNTER — Ambulatory Visit: Payer: Medicare Other | Admitting: Physical Therapy

## 2011-10-05 ENCOUNTER — Ambulatory Visit: Payer: Medicare Other | Admitting: Physical Therapy

## 2011-10-07 ENCOUNTER — Ambulatory Visit: Payer: Medicare Other | Admitting: Physical Therapy

## 2011-10-10 ENCOUNTER — Ambulatory Visit: Payer: Medicare Other | Admitting: Physical Therapy

## 2011-10-11 ENCOUNTER — Ambulatory Visit: Payer: Medicare Other | Admitting: Physical Therapy

## 2011-10-12 ENCOUNTER — Ambulatory Visit: Payer: Medicare Other | Admitting: Physical Therapy

## 2011-10-13 ENCOUNTER — Ambulatory Visit: Payer: Medicare Other | Admitting: Physical Therapy

## 2011-10-17 ENCOUNTER — Ambulatory Visit: Payer: Medicare Other | Admitting: Physical Therapy

## 2011-10-18 ENCOUNTER — Ambulatory Visit: Payer: Medicare Other | Admitting: Physical Therapy

## 2011-10-19 ENCOUNTER — Ambulatory Visit: Payer: Medicare Other | Admitting: Physical Therapy

## 2011-10-20 ENCOUNTER — Ambulatory Visit: Payer: Medicare Other | Admitting: Physical Therapy

## 2011-10-24 ENCOUNTER — Ambulatory Visit: Payer: Medicare Other | Admitting: Physical Therapy

## 2011-10-25 ENCOUNTER — Ambulatory Visit: Payer: Medicare Other | Admitting: Physical Therapy

## 2011-10-26 ENCOUNTER — Ambulatory Visit: Payer: Medicare Other | Admitting: Physical Therapy

## 2011-10-27 ENCOUNTER — Ambulatory Visit: Payer: Medicare Other | Admitting: Physical Therapy

## 2011-10-31 ENCOUNTER — Ambulatory Visit: Payer: Medicare Other | Admitting: Physical Therapy

## 2011-11-02 ENCOUNTER — Ambulatory Visit: Payer: Medicare Other | Attending: Psychiatry | Admitting: Physical Therapy

## 2011-11-02 DIAGNOSIS — R5381 Other malaise: Secondary | ICD-10-CM | POA: Insufficient documentation

## 2011-11-02 DIAGNOSIS — R269 Unspecified abnormalities of gait and mobility: Secondary | ICD-10-CM | POA: Diagnosis not present

## 2011-11-02 DIAGNOSIS — R293 Abnormal posture: Secondary | ICD-10-CM | POA: Insufficient documentation

## 2011-11-02 DIAGNOSIS — M6281 Muscle weakness (generalized): Secondary | ICD-10-CM | POA: Insufficient documentation

## 2011-11-02 DIAGNOSIS — IMO0001 Reserved for inherently not codable concepts without codable children: Secondary | ICD-10-CM | POA: Diagnosis not present

## 2011-11-03 DIAGNOSIS — F0280 Dementia in other diseases classified elsewhere without behavioral disturbance: Secondary | ICD-10-CM | POA: Diagnosis not present

## 2011-11-03 DIAGNOSIS — G20 Parkinson's disease: Secondary | ICD-10-CM | POA: Diagnosis not present

## 2011-11-03 DIAGNOSIS — G609 Hereditary and idiopathic neuropathy, unspecified: Secondary | ICD-10-CM | POA: Diagnosis not present

## 2011-11-07 ENCOUNTER — Ambulatory Visit: Payer: Medicare Other | Admitting: Physical Therapy

## 2011-11-07 DIAGNOSIS — R293 Abnormal posture: Secondary | ICD-10-CM | POA: Diagnosis not present

## 2011-11-07 DIAGNOSIS — R269 Unspecified abnormalities of gait and mobility: Secondary | ICD-10-CM | POA: Diagnosis not present

## 2011-11-07 DIAGNOSIS — R5381 Other malaise: Secondary | ICD-10-CM | POA: Diagnosis not present

## 2011-11-07 DIAGNOSIS — M6281 Muscle weakness (generalized): Secondary | ICD-10-CM | POA: Diagnosis not present

## 2011-11-07 DIAGNOSIS — IMO0001 Reserved for inherently not codable concepts without codable children: Secondary | ICD-10-CM | POA: Diagnosis not present

## 2011-11-09 ENCOUNTER — Ambulatory Visit: Payer: Medicare Other | Admitting: Physical Therapy

## 2011-11-09 DIAGNOSIS — M6281 Muscle weakness (generalized): Secondary | ICD-10-CM | POA: Diagnosis not present

## 2011-11-09 DIAGNOSIS — IMO0001 Reserved for inherently not codable concepts without codable children: Secondary | ICD-10-CM | POA: Diagnosis not present

## 2011-11-09 DIAGNOSIS — R269 Unspecified abnormalities of gait and mobility: Secondary | ICD-10-CM | POA: Diagnosis not present

## 2011-11-09 DIAGNOSIS — R293 Abnormal posture: Secondary | ICD-10-CM | POA: Diagnosis not present

## 2011-11-09 DIAGNOSIS — R5381 Other malaise: Secondary | ICD-10-CM | POA: Diagnosis not present

## 2011-11-14 ENCOUNTER — Ambulatory Visit: Payer: Medicare Other | Admitting: Physical Therapy

## 2011-11-14 DIAGNOSIS — IMO0001 Reserved for inherently not codable concepts without codable children: Secondary | ICD-10-CM | POA: Diagnosis not present

## 2011-11-14 DIAGNOSIS — R5381 Other malaise: Secondary | ICD-10-CM | POA: Diagnosis not present

## 2011-11-14 DIAGNOSIS — R293 Abnormal posture: Secondary | ICD-10-CM | POA: Diagnosis not present

## 2011-11-14 DIAGNOSIS — R269 Unspecified abnormalities of gait and mobility: Secondary | ICD-10-CM | POA: Diagnosis not present

## 2011-11-14 DIAGNOSIS — M6281 Muscle weakness (generalized): Secondary | ICD-10-CM | POA: Diagnosis not present

## 2011-11-16 ENCOUNTER — Ambulatory Visit: Payer: Medicare Other | Admitting: Physical Therapy

## 2011-11-16 DIAGNOSIS — R5381 Other malaise: Secondary | ICD-10-CM | POA: Diagnosis not present

## 2011-11-16 DIAGNOSIS — IMO0001 Reserved for inherently not codable concepts without codable children: Secondary | ICD-10-CM | POA: Diagnosis not present

## 2011-11-16 DIAGNOSIS — M6281 Muscle weakness (generalized): Secondary | ICD-10-CM | POA: Diagnosis not present

## 2011-11-16 DIAGNOSIS — R293 Abnormal posture: Secondary | ICD-10-CM | POA: Diagnosis not present

## 2011-11-16 DIAGNOSIS — R1012 Left upper quadrant pain: Secondary | ICD-10-CM | POA: Diagnosis not present

## 2011-11-16 DIAGNOSIS — R269 Unspecified abnormalities of gait and mobility: Secondary | ICD-10-CM | POA: Diagnosis not present

## 2011-11-21 ENCOUNTER — Ambulatory Visit: Payer: Medicare Other | Admitting: Physical Therapy

## 2011-11-21 DIAGNOSIS — R293 Abnormal posture: Secondary | ICD-10-CM | POA: Diagnosis not present

## 2011-11-21 DIAGNOSIS — R269 Unspecified abnormalities of gait and mobility: Secondary | ICD-10-CM | POA: Diagnosis not present

## 2011-11-21 DIAGNOSIS — IMO0001 Reserved for inherently not codable concepts without codable children: Secondary | ICD-10-CM | POA: Diagnosis not present

## 2011-11-21 DIAGNOSIS — M6281 Muscle weakness (generalized): Secondary | ICD-10-CM | POA: Diagnosis not present

## 2011-11-21 DIAGNOSIS — R5381 Other malaise: Secondary | ICD-10-CM | POA: Diagnosis not present

## 2011-11-23 ENCOUNTER — Ambulatory Visit: Payer: Medicare Other | Admitting: Physical Therapy

## 2011-11-23 DIAGNOSIS — M6281 Muscle weakness (generalized): Secondary | ICD-10-CM | POA: Diagnosis not present

## 2011-11-23 DIAGNOSIS — IMO0001 Reserved for inherently not codable concepts without codable children: Secondary | ICD-10-CM | POA: Diagnosis not present

## 2011-11-23 DIAGNOSIS — R293 Abnormal posture: Secondary | ICD-10-CM | POA: Diagnosis not present

## 2011-11-23 DIAGNOSIS — R269 Unspecified abnormalities of gait and mobility: Secondary | ICD-10-CM | POA: Diagnosis not present

## 2011-11-23 DIAGNOSIS — R5381 Other malaise: Secondary | ICD-10-CM | POA: Diagnosis not present

## 2011-11-23 DIAGNOSIS — H906 Mixed conductive and sensorineural hearing loss, bilateral: Secondary | ICD-10-CM | POA: Diagnosis not present

## 2011-12-15 DIAGNOSIS — K56609 Unspecified intestinal obstruction, unspecified as to partial versus complete obstruction: Secondary | ICD-10-CM

## 2011-12-15 HISTORY — DX: Unspecified intestinal obstruction, unspecified as to partial versus complete obstruction: K56.609

## 2011-12-16 ENCOUNTER — Inpatient Hospital Stay (HOSPITAL_COMMUNITY)
Admission: EM | Admit: 2011-12-16 | Discharge: 2011-12-22 | DRG: 389 | Disposition: A | Discharge status: Rehabilitation Facility | Payer: Medicare Other | Attending: Internal Medicine | Admitting: Internal Medicine

## 2011-12-16 ENCOUNTER — Encounter (HOSPITAL_COMMUNITY): Payer: Self-pay | Admitting: *Deleted

## 2011-12-16 ENCOUNTER — Emergency Department (HOSPITAL_COMMUNITY): Payer: Medicare Other

## 2011-12-16 DIAGNOSIS — M216X9 Other acquired deformities of unspecified foot: Secondary | ICD-10-CM | POA: Diagnosis present

## 2011-12-16 DIAGNOSIS — Z79899 Other long term (current) drug therapy: Secondary | ICD-10-CM | POA: Diagnosis not present

## 2011-12-16 DIAGNOSIS — Z87891 Personal history of nicotine dependence: Secondary | ICD-10-CM

## 2011-12-16 DIAGNOSIS — M129 Arthropathy, unspecified: Secondary | ICD-10-CM | POA: Diagnosis present

## 2011-12-16 DIAGNOSIS — K565 Intestinal adhesions [bands], unspecified as to partial versus complete obstruction: Secondary | ICD-10-CM | POA: Diagnosis not present

## 2011-12-16 DIAGNOSIS — G20A1 Parkinson's disease without dyskinesia, without mention of fluctuations: Secondary | ICD-10-CM | POA: Diagnosis not present

## 2011-12-16 DIAGNOSIS — G20 Parkinson's disease: Secondary | ICD-10-CM | POA: Diagnosis present

## 2011-12-16 DIAGNOSIS — R5381 Other malaise: Secondary | ICD-10-CM | POA: Diagnosis not present

## 2011-12-16 DIAGNOSIS — R110 Nausea: Secondary | ICD-10-CM | POA: Diagnosis not present

## 2011-12-16 DIAGNOSIS — R109 Unspecified abdominal pain: Secondary | ICD-10-CM | POA: Diagnosis not present

## 2011-12-16 DIAGNOSIS — K2990 Gastroduodenitis, unspecified, without bleeding: Secondary | ICD-10-CM | POA: Diagnosis not present

## 2011-12-16 DIAGNOSIS — D696 Thrombocytopenia, unspecified: Secondary | ICD-10-CM | POA: Diagnosis not present

## 2011-12-16 DIAGNOSIS — Z85038 Personal history of other malignant neoplasm of large intestine: Secondary | ICD-10-CM | POA: Diagnosis not present

## 2011-12-16 DIAGNOSIS — Z5189 Encounter for other specified aftercare: Secondary | ICD-10-CM | POA: Diagnosis not present

## 2011-12-16 DIAGNOSIS — R112 Nausea with vomiting, unspecified: Secondary | ICD-10-CM | POA: Diagnosis not present

## 2011-12-16 DIAGNOSIS — F3289 Other specified depressive episodes: Secondary | ICD-10-CM | POA: Diagnosis present

## 2011-12-16 DIAGNOSIS — F0390 Unspecified dementia without behavioral disturbance: Secondary | ICD-10-CM | POA: Diagnosis present

## 2011-12-16 DIAGNOSIS — N179 Acute kidney failure, unspecified: Secondary | ICD-10-CM | POA: Diagnosis not present

## 2011-12-16 DIAGNOSIS — F329 Major depressive disorder, single episode, unspecified: Secondary | ICD-10-CM | POA: Diagnosis present

## 2011-12-16 DIAGNOSIS — E860 Dehydration: Secondary | ICD-10-CM | POA: Diagnosis present

## 2011-12-16 DIAGNOSIS — K56609 Unspecified intestinal obstruction, unspecified as to partial versus complete obstruction: Secondary | ICD-10-CM | POA: Diagnosis not present

## 2011-12-16 DIAGNOSIS — R141 Gas pain: Secondary | ICD-10-CM | POA: Diagnosis not present

## 2011-12-16 HISTORY — DX: Depression, unspecified: F32.A

## 2011-12-16 HISTORY — DX: Parkinson's disease: G20

## 2011-12-16 HISTORY — DX: Unspecified osteoarthritis, unspecified site: M19.90

## 2011-12-16 HISTORY — DX: Unspecified dementia, unspecified severity, without behavioral disturbance, psychotic disturbance, mood disturbance, and anxiety: F03.90

## 2011-12-16 HISTORY — DX: Unspecified intestinal obstruction, unspecified as to partial versus complete obstruction: K56.609

## 2011-12-16 HISTORY — DX: Major depressive disorder, single episode, unspecified: F32.9

## 2011-12-16 LAB — CBC
Hemoglobin: 14.6 g/dL (ref 13.0–17.0)
Hemoglobin: 14.8 g/dL (ref 13.0–17.0)
MCH: 31.1 pg (ref 26.0–34.0)
MCHC: 32.9 g/dL (ref 30.0–36.0)
MCHC: 33.3 g/dL (ref 30.0–36.0)
Platelets: 130 10*3/uL — ABNORMAL LOW (ref 150–400)
RBC: 4.69 MIL/uL (ref 4.22–5.81)
RBC: 4.7 MIL/uL (ref 4.22–5.81)
WBC: 7 10*3/uL (ref 4.0–10.5)

## 2011-12-16 LAB — COMPREHENSIVE METABOLIC PANEL
ALT: 6 U/L (ref 0–53)
AST: 29 U/L (ref 0–37)
Albumin: 4 g/dL (ref 3.5–5.2)
Alkaline Phosphatase: 86 U/L (ref 39–117)
Chloride: 103 mEq/L (ref 96–112)
Potassium: 4.8 mEq/L (ref 3.5–5.1)
Sodium: 141 mEq/L (ref 135–145)
Total Bilirubin: 0.7 mg/dL (ref 0.3–1.2)
Total Protein: 7.6 g/dL (ref 6.0–8.3)

## 2011-12-16 LAB — URINALYSIS, ROUTINE W REFLEX MICROSCOPIC
Bilirubin Urine: NEGATIVE
Glucose, UA: NEGATIVE mg/dL
Hgb urine dipstick: NEGATIVE
Nitrite: NEGATIVE
Specific Gravity, Urine: >1.046 — ABNORMAL HIGH (ref 1.005–1.030)
pH: 5.5 (ref 5.0–8.0)

## 2011-12-16 LAB — MAGNESIUM: Magnesium: 2.2 mg/dL (ref 1.5–2.5)

## 2011-12-16 LAB — CREATININE, SERUM
Creatinine, Ser: 1.02 mg/dL (ref 0.50–1.35)
GFR calc Af Amer: 75 mL/min — ABNORMAL LOW (ref >90)
GFR calc non Af Amer: 64 mL/min — ABNORMAL LOW (ref >90)

## 2011-12-16 LAB — DIFFERENTIAL
Basophils Relative: 0 % (ref 0–1)
Eosinophils Absolute: 0 10*3/uL (ref 0.0–0.7)
Neutro Abs: 7.4 10*3/uL (ref 1.7–7.7)
Neutrophils Relative %: 95 % — ABNORMAL HIGH (ref 43–77)

## 2011-12-16 MED ORDER — ACETAMINOPHEN 325 MG PO TABS
650.0000 mg | ORAL_TABLET | Freq: Four times a day (QID) | ORAL | Status: DC | PRN
  Filled 2011-12-16: qty 2

## 2011-12-16 MED ORDER — CARBIDOPA-LEVODOPA 25-100 MG PO TABS
3.0000 | ORAL_TABLET | Freq: Three times a day (TID) | ORAL | Status: DC
  Administered 2011-12-16 – 2011-12-22 (×17): 3 via ORAL
  Filled 2011-12-16 (×20): qty 3

## 2011-12-16 MED ORDER — ALBUTEROL SULFATE (5 MG/ML) 0.5% IN NEBU: 2.5000 mg | INHALATION_SOLUTION | Freq: Four times a day (QID) | RESPIRATORY_TRACT | Status: DC

## 2011-12-16 MED ORDER — ONDANSETRON HCL 4 MG PO TABS: 4.0000 mg | ORAL_TABLET | Freq: Four times a day (QID) | ORAL | Status: DC | PRN

## 2011-12-16 MED ORDER — MORPHINE SULFATE 2 MG/ML IJ SOLN
2.0000 mg | Freq: Once | INTRAMUSCULAR | Status: AC
  Administered 2011-12-16: 2 mg via INTRAVENOUS
  Filled 2011-12-16: qty 1

## 2011-12-16 MED ORDER — KCL IN DEXTROSE-NACL 10-5-0.45 MEQ/L-%-% IV SOLN
INTRAVENOUS | Status: DC
  Administered 2011-12-16: 16:00:00 via INTRAVENOUS
  Administered 2011-12-17: 75 mL/h via INTRAVENOUS
  Administered 2011-12-18: 08:00:00 via INTRAVENOUS
  Filled 2011-12-16 (×7): qty 1000

## 2011-12-16 MED ORDER — ALBUTEROL SULFATE (5 MG/ML) 0.5% IN NEBU: 2.5000 mg | INHALATION_SOLUTION | Freq: Four times a day (QID) | RESPIRATORY_TRACT | Status: DC | PRN

## 2011-12-16 MED ORDER — ONDANSETRON HCL 4 MG/2ML IJ SOLN
INTRAMUSCULAR | Status: AC
  Administered 2011-12-16: 4 mg via INTRAVENOUS
  Filled 2011-12-16: qty 2

## 2011-12-16 MED ORDER — IOHEXOL 300 MG/ML  SOLN
100.0000 mL | Freq: Once | INTRAMUSCULAR | Status: AC | PRN
  Administered 2011-12-16: 100 mL via INTRAVENOUS

## 2011-12-16 MED ORDER — ACETAMINOPHEN 650 MG RE SUPP
650.0000 mg | Freq: Four times a day (QID) | RECTAL | Status: DC | PRN
  Administered 2011-12-18: 650 mg via RECTAL
  Filled 2011-12-16: qty 1

## 2011-12-16 MED ORDER — DULOXETINE HCL 60 MG PO CPEP
60.0000 mg | ORAL_CAPSULE | Freq: Every day | ORAL | Status: DC
  Administered 2011-12-16 – 2011-12-22 (×7): 60 mg via ORAL
  Filled 2011-12-16 (×7): qty 1

## 2011-12-16 MED ORDER — ONDANSETRON HCL 4 MG/2ML IJ SOLN: 4.0000 mg | Freq: Three times a day (TID) | INTRAMUSCULAR | Status: DC | PRN

## 2011-12-16 MED ORDER — CARBIDOPA-LEVODOPA CR 25-100 MG PO TBCR
1.0000 | EXTENDED_RELEASE_TABLET | Freq: Every day | ORAL | Status: DC
  Administered 2011-12-16 – 2011-12-21 (×6): 1 via ORAL
  Filled 2011-12-16 (×7): qty 1

## 2011-12-16 MED ORDER — SODIUM CHLORIDE 0.9 % IV SOLN
INTRAVENOUS | Status: DC
  Administered 2011-12-16: 13:00:00 via INTRAVENOUS

## 2011-12-16 MED ORDER — RIVASTIGMINE 9.5 MG/24HR TD PT24
9.5000 mg | MEDICATED_PATCH | Freq: Every day | TRANSDERMAL | Status: DC
  Administered 2011-12-16 – 2011-12-22 (×7): 9.5 mg via TRANSDERMAL
  Filled 2011-12-16 (×8): qty 1

## 2011-12-16 MED ORDER — ONDANSETRON HCL 4 MG/2ML IJ SOLN
4.0000 mg | Freq: Once | INTRAMUSCULAR | Status: AC
  Administered 2011-12-16: 4 mg via INTRAVENOUS

## 2011-12-16 MED ORDER — ONDANSETRON HCL 4 MG/2ML IJ SOLN
INTRAMUSCULAR | Status: AC
  Administered 2011-12-16: 06:00:00 via INTRAVENOUS
  Filled 2011-12-16: qty 2

## 2011-12-16 MED ORDER — ONDANSETRON HCL 4 MG/2ML IJ SOLN: 4.0000 mg | Freq: Four times a day (QID) | INTRAMUSCULAR | Status: DC | PRN

## 2011-12-16 MED ORDER — IOHEXOL 300 MG/ML  SOLN
20.0000 mL | INTRAMUSCULAR | Status: DC
  Administered 2011-12-16: 20 mL via ORAL

## 2011-12-16 MED ORDER — HEPARIN SODIUM (PORCINE) 5000 UNIT/ML IJ SOLN
5000.0000 [IU] | Freq: Three times a day (TID) | INTRAMUSCULAR | Status: DC
  Administered 2011-12-16 – 2011-12-22 (×19): 5000 [IU] via SUBCUTANEOUS
  Filled 2011-12-16 (×21): qty 1

## 2011-12-16 NOTE — Consult Note (Signed)
Reason for Consult: abdominal pain Referring Physician: Dr. Pascal Lux is an 78 y.o. male.  HPI: Presents to [facility] with history of colon cancer status post colon resection -- presents with onset of abdominal pain at approximately 10:30 PM last night. Pain was described as sharp andl over his abdomen.  He developed nausea and vomiting upon arrival to the emergency room this morning. Patient states that his stomach feels distended. Patient took Prevacid last night without relief. He has not had pain like this in the past. Patient denies fevers, cold symptoms, chest pain, shortness of breath, back pain, urinary symptoms. Patient's last bowel movement was yesterday afternoon. It was normal. No blood was noted. Patient has not been passing gas overnight. Palpation of the abdomen elicits painful response, abdomen is diffusely painful in all quadrants.Nothing makes the symptoms better. No history of arrhythmia or anticoagulation.    Past Medical History  Diagnosis Date  . Parkinson disease     No past surgical history on file.  History reviewed. No pertinent family history.  Social History:  reports that he has quit smoking. He does not have any smokeless tobacco history on file. He reports that he does not drink alcohol or use illicit drugs.  Allergies:  Allergies  Allergen Reactions  . Amantadines Other (See Comments)    Weakness in legs and hallucinations  . Fentanyl Other (See Comments)    Confusion and disorientation   . Gabapentin Other (See Comments)    Weakness in legs and hallucinations  . Oxycodone     Confusion and disorientation   . Phenergan (Promethazine Hcl)     Confusion and disorientation     Medications:  Results for orders placed during the hospital encounter of 12/16/11 (from the past 48 hour(s))  CBC     Status: Abnormal   Collection Time   12/16/11  6:20 AM      Component Value Range Comment   WBC 7.8  4.0 - 10.5 K/uL    RBC 4.69  4.22 - 5.81 MIL/uL      Hemoglobin 14.6  13.0 - 17.0 g/dL    HCT 40.9  81.1 - 91.4 %    MCV 94.7  78.0 - 100.0 fL    MCH 31.1  26.0 - 34.0 pg    MCHC 32.9  30.0 - 36.0 g/dL    RDW 78.2  95.6 - 21.3 %    Platelets 130 (*) 150 - 400 K/uL   DIFFERENTIAL     Status: Abnormal   Collection Time   12/16/11  6:20 AM      Component Value Range Comment   Neutrophils Relative 95 (*) 43 - 77 %    Neutro Abs 7.4  1.7 - 7.7 K/uL    Lymphocytes Relative 4 (*) 12 - 46 %    Lymphs Abs 0.3 (*) 0.7 - 4.0 K/uL    Monocytes Relative 1 (*) 3 - 12 %    Monocytes Absolute 0.1  0.1 - 1.0 K/uL    Eosinophils Relative 0  0 - 5 %    Eosinophils Absolute 0.0  0.0 - 0.7 K/uL    Basophils Relative 0  0 - 1 %    Basophils Absolute 0.0  0.0 - 0.1 K/uL   COMPREHENSIVE METABOLIC PANEL     Status: Abnormal   Collection Time   12/16/11  6:20 AM      Component Value Range Comment   Sodium 141  135 - 145 mEq/L  Potassium 4.8  3.5 - 5.1 mEq/L HEMOLYSIS AT THIS LEVEL MAY AFFECT RESULT   Chloride 103  96 - 112 mEq/L    CO2 27  19 - 32 mEq/L    Glucose, Bld 140 (*) 70 - 99 mg/dL    BUN 29 (*) 6 - 23 mg/dL    Creatinine, Ser 4.09  0.50 - 1.35 mg/dL    Calcium 81.1  8.4 - 10.5 mg/dL    Total Protein 7.6  6.0 - 8.3 g/dL    Albumin 4.0  3.5 - 5.2 g/dL    AST 29  0 - 37 U/L HEMOLYSIS AT THIS LEVEL MAY AFFECT RESULT   ALT 6  0 - 53 U/L    Alkaline Phosphatase 86  39 - 117 U/L    Total Bilirubin 0.7  0.3 - 1.2 mg/dL    GFR calc non Af Amer 74 (*) >90 mL/min    GFR calc Af Amer 86 (*) >90 mL/min   LIPASE, BLOOD     Status: Normal   Collection Time   12/16/11  6:20 AM      Component Value Range Comment   Lipase 21  11 - 59 U/L   LACTIC ACID, PLASMA     Status: Normal   Collection Time   12/16/11  6:20 AM      Component Value Range Comment   Lactic Acid, Venous 2.2  0.5 - 2.2 mmol/L   URINALYSIS, ROUTINE W REFLEX MICROSCOPIC     Status: Abnormal   Collection Time   12/16/11  9:51 AM      Component Value Range Comment   Color, Urine  YELLOW  YELLOW    APPearance CLEAR  CLEAR    Specific Gravity, Urine >1.046 (*) 1.005 - 1.030    pH 5.5  5.0 - 8.0    Glucose, UA NEGATIVE  NEGATIVE mg/dL    Hgb urine dipstick NEGATIVE  NEGATIVE    Bilirubin Urine NEGATIVE  NEGATIVE    Ketones, ur NEGATIVE  NEGATIVE mg/dL    Protein, ur NEGATIVE  NEGATIVE mg/dL    Urobilinogen, UA 0.2  0.0 - 1.0 mg/dL    Nitrite NEGATIVE  NEGATIVE    Leukocytes, UA NEGATIVE  NEGATIVE MICROSCOPIC NOT DONE ON URINES WITH NEGATIVE PROTEIN, BLOOD, LEUKOCYTES, NITRITE, OR GLUCOSE <1000 mg/dL.    Ct Abdomen Pelvis W Contrast  12/16/2011  *RADIOLOGY REPORT*  Clinical Data: Low abdominal pain with nausea.  History of Parkinson's disease.  CT ABDOMEN AND PELVIS WITH CONTRAST  Technique:  Multidetector CT imaging of the abdomen and pelvis was performed following the standard protocol during bolus administration of intravenous contrast.  Contrast: OMNIPAQUE IOHEXOL 300 MG/ML  SOLN  Comparison: Limited correlation is made with a lumbar MRI 11/06/2007.  Findings: There is atelectasis or scarring in both lung bases. There is a small granuloma in the right lower lobe.  No significant pleural effusion is present.  The stomach, proximal and mid small bowel are diffusely distended with air fluid levels.  There is an apparent focal transition point in the left infraumbilical area anteriorly.  The point of transition is best seen on coronal image 29.  The distal small bowel and colon are decompressed.  There is fecalization of the small bowel proximal to this apparent level of obstruction.  No adjacent soft tissue mass is identified.  There is no significant bowel wall thickening or pneumatosis.  There is mild mesenteric edema without focal extraluminal fluid or air collection.  The  liver, gallbladder and biliary system appear normal.  There is a 1.5 cm low density lesion in the pancreatic head on image 36. The pancreas appears mildly atrophied without other focal abnormality or  ductal dilatation.  The spleen, adrenal glands and kidneys appear normal.  There are no enlarged abdominal pelvic lymph nodes.  The bladder, prostate gland and seminal vesicles appear unremarkable for age. There is a left supraumbilical hernia containing only fat (removed from the area of bowel transition).  There are also small inguinal hernias containing only fat.  There extensive postsurgical changes in the spine status post multilevel fusion.  There is a lower thoracic spinal stimulator. There is severe asymmetric right hip arthropathy.  IMPRESSION:  1.  Partial small-bowel obstruction.  The transition point appears to be within the mid ileum in the left infraumbilical region as described above.  Fecalization of the small bowel proximal to the level of obstruction suggests some chronicity. 2.  No evidence of bowel perforation or abscess. 3.  A small low-density lesion within the pancreatic head is likely incidental. Given the spinal stimulator and presumed inability to undergo MRI, I would suggest CT followup in approximately 6 months to assess stability.  Original Report Authenticated By: Gerrianne Scale, M.D.    Review of Systems  Constitutional: Negative.   HENT: Negative.   Eyes: Negative.   Respiratory: Negative.   Gastrointestinal: Positive for abdominal pain and constipation.  Genitourinary: Negative.   Musculoskeletal: Negative.   Skin: Negative.   Neurological: Negative.   Endo/Heme/Allergies: Negative.   Psychiatric/Behavioral: Positive for memory loss.   Blood pressure 159/88, pulse 80, temperature 97.5 F (36.4 C), temperature source Oral, resp. rate 18, SpO2 97.00%. Physical Exam as noted above in ROS.   Assessment/Plan: Probable SBO, multiple other medical issues/problems  Plan/Recommendations: 1. Patient is to be admitted under medicine service for management of his multiple medical problems. 2. NPO status,continue with NG tube to LCS,IVF,Cipro,Flagyl antibiotic  coverage. 3.Also recommend decreasing or eliminating use of narcotics for pain control if possible as wife feels that these "just make him go crazy" use of alternative analgesics would be preferable. 4. Also recommend PT/OT for conditioning as he has been on pt/ot regimen and will need to continue. 5. Repeat CT scan in a few days  Thank you for this consult we will follow along with you.   Nena Hampe 12/16/2011, 11:02 AM

## 2011-12-16 NOTE — Consult Note (Signed)
Partial small bowel obstruction likely due to adhesions from previous partial colectomy. Patient is nontender. Abdomen has significantly improved since placement of nasogastric tube. We'll continue IV fluids, bowel rest, and nasogastric tube decompression. We will follow closely. I spoke to the patient and his family regarding his disease process. I answered their questions. Patient examined and I agree with the assessment and plan  Violeta Gelinas, MD, MPH, FACS Pager: [phone]  12/16/2011 6:13 PM

## 2011-12-16 NOTE — ED Notes (Signed)
Patient presents via EMS from home with c/o stomach pain, vomited times 1  EMS adm Zofran 4mg  IV enroute

## 2011-12-16 NOTE — ED Notes (Signed)
Patient started on oral contrast for CT

## 2011-12-16 NOTE — ED Notes (Signed)
Wife returned to bedside, requesting to speak with MD. PA Josh made aware and will go to inform pt's family of updates.

## 2011-12-16 NOTE — ED Notes (Signed)
Patient c/o nausea and lower abd pain.

## 2011-12-16 NOTE — ED Notes (Signed)
Patient transported to CT 

## 2011-12-16 NOTE — ED Notes (Signed)
Contacted CT Scan- states pt will be sent for shortly.

## 2011-12-16 NOTE — H&P (Addendum)
[facility] History and Physical  Paul Callahan:[id number] DOB: January 08, 1924 DOA: 12/16/2011   PCP: [name], MD   Chief Complaint: Abdominal Pain, Nausea  HPI:  78 y.o. Male with PMH  of colon cancer status post colon resection presents with onset of abdominal pain at approximately 10:30 PM last night. Pain was described as sharp andl over his abdomen. He developed nausea and vomiting upon arrival to the emergency room this morning. Patient states that his stomach feels distended. Patient took Prevacid last night without relief. He has not had pain like this in the past. Last bowel movement was yesterday afternoon. No blood was noted. Patient has not been passing gas overnight. He has also developed anorexia.  Review of Systems:  Constitutional:  No weight loss, night sweats, Fevers, chills, fatigue.  HEENT:  No headaches, Difficulty swallowing,Tooth/dental problems,Sore throat,  No sneezing, itching, ear ache, nasal congestion, post nasal drip,  Cardio-vascular:  No chest pain, Orthopnea, PND, swelling in lower extremities, anasarca, dizziness, palpitations  GI:  No heartburn, , diarrhea,  Resp:  No shortness of breath with exertion or at rest. No excess mucus, no productive cough, No non-productive cough, No coughing up of blood.No change in color of mucus.No wheezing.No chest wall deformity  Skin:  no rash or lesions.  GU:  no dysuria, change in color of urine, no urgency or frequency. No flank pain.  Musculoskeletal:  No joint pain or swelling. No decreased range of motion. No back pain.  Psych:  No change in mood or affect. No depression or anxiety. No memory loss.    Past Medical History  Diagnosis Date  . Parkinson disease   . Dementia   . Depression   . Bowel obstruction 12/15/2011  . Neuromuscular disorder     hx of parkinsons  . Cancer     hx of colon cancer  . Arthritis    Past Surgical History  Procedure Date  . Eye surgery   . Cataracts   .  Hernia repair   . Back surgery     x 5   Social History:  reports that he has quit smoking. He has never used smokeless tobacco. He reports that he does not drink alcohol or use illicit drugs.  Allergies  Allergen Reactions  . Amantadines Other (See Comments)    Weakness in legs and hallucinations  . Fentanyl Other (See Comments)    Confusion and disorientation   . Gabapentin Other (See Comments)    Weakness in legs and hallucinations  . Oxycodone     Confusion and disorientation   . Phenergan (Promethazine Hcl)     Confusion and disorientation     History reviewed. No pertinent family history.  Prior to Admission medications   Medication Sig Start Date End Date Taking? Authorizing Provider  acetaminophen (TYLENOL) 500 MG tablet Take 1,000 mg by mouth 2 (two) times daily.   Yes Historical Provider, MD  calcium carbonate (OS-CAL) 600 MG TABS Take 600 mg by mouth 2 (two) times daily with a meal.   Yes Historical Provider, MD  carbidopa-levodopa (SINEMET CR) 25-100 MG per tablet Take 1 tablet by mouth at bedtime.   Yes Historical Provider, MD  carbidopa-levodopa (SINEMET IR) 25-100 MG per tablet Take 3 tablets by mouth 3 (three) times daily.   Yes Historical Provider, MD  celecoxib (CELEBREX) 200 MG capsule Take 200 mg by mouth 2 (two) times daily.   Yes Historical Provider, MD  cholecalciferol (VITAMIN D) 1000 UNITS tablet Take 2,000  Units by mouth daily.   Yes Historical Provider, MD  DULoxetine (CYMBALTA) 60 MG capsule Take 60 mg by mouth daily.   Yes Historical Provider, MD  Multiple Vitamin (MULTIVITAMIN WITH MINERALS) TABS Take 1 tablet by mouth daily.   Yes Historical Provider, MD  pregabalin (LYRICA) 150 MG capsule Take 150 mg by mouth 2 (two) times daily.   Yes Historical Provider, MD  rivastigmine (EXELON) 9.5 mg/24hr Place 1 patch onto the skin daily.   Yes Historical Provider, MD  zinc gluconate 50 MG tablet Take 50 mg by mouth daily.   Yes Historical Provider, MD    Physical Exam: Filed Vitals:   12/16/11 0645 12/16/11 0700 12/16/11 0730 12/16/11 0800  BP: 122/72 136/73 158/79 159/88  Pulse: 76 77 87 80  Temp:      TempSrc:      Resp: 16 18 23 18   SpO2: 99% 97% 98% 97%   BP 159/88  Pulse 80  Temp 97.5 F (36.4 C) (Oral)  Resp 18  SpO2 97%  General Appearance:    Alert, cooperative, no distress, appears stated age  Head:    Normocephalic, without obvious abnormality, atraumatic  Eyes:    PERRL, conjunctiva/corneas clear, EOM's intact, fundi    benign, both eyes       Ears:    Normal TM's and external ear canals, both ears  Nose:   Nares normal, septum midline, mucosa normal, no drainage    or sinus tenderness  Throat:   Lips, mucosa, and tongue normal; teeth and gums normal  Neck:   Supple, symmetrical, trachea midline, no adenopathy;       thyroid:  No enlargement/tenderness/nodules; no carotid   bruit or JVD     Lungs:     Clear to auscultation bilaterally, respirations unlabored  Chest wall:    No tenderness or deformity  Heart:    Regular rate and rhythm, S1 and S2 normal, no murmur, rub   or gallop  Abdomen:     distended abdomen, soft diffuse tenderness hypoactive bowel sounds no rebound or guarding .        Extremities:   Extremities normal, atraumatic, no cyanosis or edema  Pulses:   2+ and symmetric all extremities  Skin:   Skin color, texture, turgor normal, no rashes or lesions  Lymph nodes:   Cervical, supraclavicular, and axillary nodes normal  Neurologic:   CNII-XII intact. Normal strength, sensation and reflexes      throughout    Labs on Admission:  Basic Metabolic Panel:  Lab 12/16/11 4098  NA 141  K 4.8  CL 103  CO2 27  GLUCOSE 140*  BUN 29*  CREATININE 0.92  CALCIUM 10.3  MG --  PHOS --   Liver Function Tests:  Lab 12/16/11 0620  AST 29  ALT 6  ALKPHOS 86  BILITOT 0.7  PROT 7.6  ALBUMIN 4.0    Lab 12/16/11 0620  LIPASE 21  AMYLASE --   No results found for this basename: AMMONIA:5 in  the last 168 hours CBC:  Lab 12/16/11 0620  WBC 7.8  NEUTROABS 7.4  HGB 14.6  HCT 44.4  MCV 94.7  PLT 130*   Cardiac Enzymes: No results found for this basename: CKTOTAL:5,CKMB:5,CKMBINDEX:5,TROPONINI:5 in the last 168 hours BNP: No components found with this basename: POCBNP:5 CBG: No results found for this basename: GLUCAP:5 in the last 168 hours  Radiological Exams on Admission: Ct Abdomen Pelvis W Contrast  12/16/2011  *RADIOLOGY REPORT*  Clinical Data:  Low abdominal pain with nausea.  History of Parkinson's disease.  CT ABDOMEN AND PELVIS WITH CONTRAST  Technique:  Multidetector CT imaging of the abdomen and pelvis was performed following the standard protocol during bolus administration of intravenous contrast.  Contrast: OMNIPAQUE IOHEXOL 300 MG/ML  SOLN  Comparison: Limited correlation is made with a lumbar MRI 11/06/2007.  Findings: There is atelectasis or scarring in both lung bases. There is a small granuloma in the right lower lobe.  No significant pleural effusion is present.  The stomach, proximal and mid small bowel are diffusely distended with air fluid levels.  There is an apparent focal transition point in the left infraumbilical area anteriorly.  The point of transition is best seen on coronal image 29.  The distal small bowel and colon are decompressed.  There is fecalization of the small bowel proximal to this apparent level of obstruction.  No adjacent soft tissue mass is identified.  There is no significant bowel wall thickening or pneumatosis.  There is mild mesenteric edema without focal extraluminal fluid or air collection.  The liver, gallbladder and biliary system appear normal.  There is a 1.5 cm low density lesion in the pancreatic head on image 36. The pancreas appears mildly atrophied without other focal abnormality or ductal dilatation.  The spleen, adrenal glands and kidneys appear normal.  There are no enlarged abdominal pelvic lymph nodes.  The bladder,  prostate gland and seminal vesicles appear unremarkable for age. There is a left supraumbilical hernia containing only fat (removed from the area of bowel transition).  There are also small inguinal hernias containing only fat.  There extensive postsurgical changes in the spine status post multilevel fusion.  There is a lower thoracic spinal stimulator. There is severe asymmetric right hip arthropathy.  IMPRESSION:  1.  Partial small-bowel obstruction.  The transition point appears to be within the mid ileum in the left infraumbilical region as described above.  Fecalization of the small bowel proximal to the level of obstruction suggests some chronicity. 2.  No evidence of bowel perforation or abscess. 3.  A small low-density lesion within the pancreatic head is likely incidental. Given the spinal stimulator and presumed inability to undergo MRI, I would suggest CT followup in approximately 6 months to assess stability.  Original Report Authenticated By: Gerrianne Scale, M.D.    EKG: Independently reviewed. Sinus rhythm, normal intervals no T wave inversions normal axis  Assessment/Plan: Principal Problem: Partial SBO (small bowel obstruction) -The patient has had a previous colon resection, I agree with treating him with conservative management as the CT scan of the abdomen and pelvis show partial small bowel obstruction. Continue NG tube monitor electrolytes closely, specially the potassium as needed. We'll start him on D5 half normal saline with 10 meq of KCL. We'll monitor for any bowel movements or gas.  -We Zofran for nausea, try to avoid narcotics. Try to keep potassium above 4 and magnesium of 2.  Acute kidney injury -Start IV fluids aggressively check a basic metabolic panel to followup on his potassium and his creatinine. This probably secondary to decreased by mouth intake. No nausea and vomiting.  Mild Thrombocytopenia -Previous platelets were 196, today 1:30 over continue monitor  closely. Last time he was in the hospital he was exposed to heparin.  Parkinson's disease -Continue current meds that NG tube 2 hours after giving Parkinson's medications.  Time spend: Greater than 45 minutes Code Status:  Family Communication: Spouse [phone] Disposition Plan: To be determined  Marinda Elk, MD  [facility] Pager [phone]  If 7PM-7AM, please contact night-coverage [email or web address] Password [email or web address] 12/16/2011, 1:14 PM

## 2011-12-16 NOTE — ED Provider Notes (Signed)
History     CSN: [id number]  Arrival date & time 12/16/11  0503   First MD Initiated Contact with Patient 12/16/11 [phone]      Chief Complaint  Patient presents with  . Abdominal Pain    (Consider location/radiation/quality/duration/timing/severity/associated sxs/prior treatment) HPI Comments: Patient with history of colon cancer status post colon resection -- presents with onset of abdominal pain at approximately 10:30 PM last night. Pain is sharp and is all over his abdomen. Pain is not radiate. Patient developed nausea and vomiting upon arrival to the emergency room this morning. Patient states that his stomach feels distended. Patient took Prevacid last night without relief. He has not had pain like this in the past. Patient denies fevers, cold symptoms, chest pain, shortness of breath, back pain, urinary symptoms. Patient's last bowel movement was yesterday afternoon. It was normal. No blood was noted. Patient has not been passing gas overnight. Palpation of the abdomen makes the symptoms worse. Nothing makes the symptoms better. No history of arrhythmia or anticoagulation.   Patient is a 78 y.o. male presenting with abdominal pain. The history is provided by the patient and the spouse.  Abdominal Pain The primary symptoms of the illness include abdominal pain, nausea and vomiting. The primary symptoms of the illness do not include fever, shortness of breath, diarrhea, hematemesis, hematochezia or dysuria. The current episode started 6 to 12 hours ago. The onset of the illness was gradual. The problem has been gradually worsening.  The patient has not had a change in bowel habit. Symptoms associated with the illness do not include chills, urgency or back pain.    Past Medical History  Diagnosis Date  . Parkinson disease     No past surgical history on file.  History reviewed. No pertinent family history.  History  Substance Use Topics  . Smoking status: Former Games developer  .  Smokeless tobacco: Not on file  . Alcohol Use: No      Review of Systems  Constitutional: Negative for fever and chills.  HENT: Negative for sore throat and rhinorrhea.   Eyes: Negative for redness.  Respiratory: Negative for cough and shortness of breath.   Cardiovascular: Negative for chest pain.  Gastrointestinal: Positive for nausea, vomiting and abdominal pain. Negative for diarrhea, blood in stool, hematochezia and hematemesis.  Genitourinary: Negative for dysuria and urgency.  Musculoskeletal: Negative for myalgias and back pain.  Skin: Negative for rash.  Neurological: Negative for headaches.    Allergies  Amantadines; Fentanyl; Gabapentin; Oxycodone; and Phenergan  Home Medications   Current Outpatient Rx  Name Route Sig Dispense Refill  . ACETAMINOPHEN 500 MG PO TABS Oral Take 1,000 mg by mouth 2 (two) times daily.    Marland Kitchen CALCIUM CARBONATE 600 MG PO TABS Oral Take 600 mg by mouth 2 (two) times daily with a meal.    . CARBIDOPA-LEVODOPA ER 25-100 MG PO TBCR Oral Take 1 tablet by mouth at bedtime.    Marland Kitchen CARBIDOPA-LEVODOPA 25-100 MG PO TABS Oral Take 3 tablets by mouth 3 (three) times daily.    . CELECOXIB 200 MG PO CAPS Oral Take 200 mg by mouth 2 (two) times daily.    Marland Kitchen VITAMIN D 1000 UNITS PO TABS Oral Take 2,000 Units by mouth daily.    . DULOXETINE HCL 60 MG PO CPEP Oral Take 60 mg by mouth daily.    . ADULT MULTIVITAMIN W/MINERALS CH Oral Take 1 tablet by mouth daily.    Marland Kitchen PREGABALIN 150 MG PO CAPS Oral  Take 150 mg by mouth 2 (two) times daily.    Marland Kitchen RIVASTIGMINE 9.5 MG/24HR TD PT24 Transdermal Place 1 patch onto the skin daily.    Marland Kitchen ZINC GLUCONATE 50 MG PO TABS Oral Take 50 mg by mouth daily.      BP 144/80  Pulse 75  Temp 97.5 F (36.4 C) (Oral)  Resp 17  SpO2 97%  Physical Exam  Nursing note and vitals reviewed. Constitutional: He appears well-developed and well-nourished.  HENT:  Head: Normocephalic and atraumatic.  Eyes: Conjunctivae are normal. Right  eye exhibits no discharge. Left eye exhibits no discharge.  Neck: Normal range of motion. Neck supple.  Cardiovascular: Normal rate, regular rhythm and normal heart sounds.   Pulmonary/Chest: Effort normal and breath sounds normal.  Abdominal: Soft. He exhibits distension. He exhibits no pulsatile midline mass and no mass. Bowel sounds are decreased. There is generalized tenderness. There is no rigidity, no rebound, no guarding, no tenderness at McBurney's point and negative Murphy's sign.  Neurological: He is alert.  Skin: Skin is warm and dry.  Psychiatric: He has a normal mood and affect.    ED Course  Procedures (including critical care time)  Labs Reviewed  CBC - Abnormal; Notable for the following:    Platelets 130 (*)     All other components within normal limits  DIFFERENTIAL - Abnormal; Notable for the following:    Neutrophils Relative 95 (*)     Lymphocytes Relative 4 (*)     Lymphs Abs 0.3 (*)     Monocytes Relative 1 (*)     All other components within normal limits  COMPREHENSIVE METABOLIC PANEL - Abnormal; Notable for the following:    Glucose, Bld 140 (*)     BUN 29 (*)     GFR calc non Af Amer 74 (*)     GFR calc Af Amer 86 (*)     All other components within normal limits  URINALYSIS, ROUTINE W REFLEX MICROSCOPIC - Abnormal; Notable for the following:    Specific Gravity, Urine >1.046 (*)     All other components within normal limits  LIPASE, BLOOD  LACTIC ACID, PLASMA  URINE MICROSCOPIC-ADD ON   Ct Abdomen Pelvis W Contrast  12/16/2011  *RADIOLOGY REPORT*  Clinical Data: Low abdominal pain with nausea.  History of Parkinson's disease.  CT ABDOMEN AND PELVIS WITH CONTRAST  Technique:  Multidetector CT imaging of the abdomen and pelvis was performed following the standard protocol during bolus administration of intravenous contrast.  Contrast: OMNIPAQUE IOHEXOL 300 MG/ML  SOLN  Comparison: Limited correlation is made with a lumbar MRI 11/06/2007.  Findings:  There is atelectasis or scarring in both lung bases. There is a small granuloma in the right lower lobe.  No significant pleural effusion is present.  The stomach, proximal and mid small bowel are diffusely distended with air fluid levels.  There is an apparent focal transition point in the left infraumbilical area anteriorly.  The point of transition is best seen on coronal image 29.  The distal small bowel and colon are decompressed.  There is fecalization of the small bowel proximal to this apparent level of obstruction.  No adjacent soft tissue mass is identified.  There is no significant bowel wall thickening or pneumatosis.  There is mild mesenteric edema without focal extraluminal fluid or air collection.  The liver, gallbladder and biliary system appear normal.  There is a 1.5 cm low density lesion in the pancreatic head on image 36.  The pancreas appears mildly atrophied without other focal abnormality or ductal dilatation.  The spleen, adrenal glands and kidneys appear normal.  There are no enlarged abdominal pelvic lymph nodes.  The bladder, prostate gland and seminal vesicles appear unremarkable for age. There is a left supraumbilical hernia containing only fat (removed from the area of bowel transition).  There are also small inguinal hernias containing only fat.  There extensive postsurgical changes in the spine status post multilevel fusion.  There is a lower thoracic spinal stimulator. There is severe asymmetric right hip arthropathy.  IMPRESSION:  1.  Partial small-bowel obstruction.  The transition point appears to be within the mid ileum in the left infraumbilical region as described above.  Fecalization of the small bowel proximal to the level of obstruction suggests some chronicity. 2.  No evidence of bowel perforation or abscess. 3.  A small low-density lesion within the pancreatic head is likely incidental. Given the spinal stimulator and presumed inability to undergo MRI, I would suggest CT  followup in approximately 6 months to assess stability.  Original Report Authenticated By: Gerrianne Scale, M.D.     1. SBO (small bowel obstruction)     6:17 AM Patient seen and examined. Work-up initiated. Medications ordered. D/w Dr. Judd Lien.   Vital signs reviewed and are as follows: Filed Vitals:   12/16/11 0516  BP: 144/80  Pulse: 75  Temp: 97.5 F (36.4 C)  Resp: 17    Date: 12/16/2011  Rate: 72  Rhythm: normal sinus rhythm  QRS Axis: normal  Intervals: normal  ST/T Wave abnormalities: normal  Conduction Disutrbances:none  Narrative Interpretation:   Old EKG Reviewed: none available   Patient informed of results. NG tube placed. Surgery consult complete, they request medical admit.   [facility] contacted and will admit.   MDM  Admit for small bowel obstruction.         Renne Crigler, [place] 12/16/11 1120

## 2011-12-17 DIAGNOSIS — K56609 Unspecified intestinal obstruction, unspecified as to partial versus complete obstruction: Secondary | ICD-10-CM

## 2011-12-17 DIAGNOSIS — N179 Acute kidney failure, unspecified: Secondary | ICD-10-CM

## 2011-12-17 DIAGNOSIS — G20 Parkinson's disease: Secondary | ICD-10-CM

## 2011-12-17 DIAGNOSIS — D696 Thrombocytopenia, unspecified: Secondary | ICD-10-CM

## 2011-12-17 LAB — COMPREHENSIVE METABOLIC PANEL
AST: 28 U/L (ref 0–37)
Albumin: 3.1 g/dL — ABNORMAL LOW (ref 3.5–5.2)
Alkaline Phosphatase: 72 U/L (ref 39–117)
BUN: 28 mg/dL — ABNORMAL HIGH (ref 6–23)
Potassium: 4.2 mEq/L (ref 3.5–5.1)
Sodium: 138 mEq/L (ref 135–145)
Total Protein: 6.1 g/dL (ref 6.0–8.3)

## 2011-12-17 LAB — CBC
HCT: 38.7 % — ABNORMAL LOW (ref 39.0–52.0)
MCV: 93.9 fL (ref 78.0–100.0)
RDW: 14.4 % (ref 11.5–15.5)
WBC: 3.2 10*3/uL — ABNORMAL LOW (ref 4.0–10.5)

## 2011-12-17 MED ORDER — MENTHOL 3 MG MT LOZG
1.0000 | LOZENGE | OROMUCOSAL | Status: DC | PRN
  Administered 2011-12-17: 3 mg via ORAL
  Filled 2011-12-17 (×2): qty 9

## 2011-12-17 MED ORDER — PHENOL 1.4 % MT LIQD
1.0000 | OROMUCOSAL | Status: DC | PRN
  Filled 2011-12-17 (×2): qty 177

## 2011-12-17 NOTE — Progress Notes (Signed)
Subjective: Feels better. No flatus or BM. No pain  Objective: Vital signs in last 24 hours: Temp:  [97.5 F (36.4 C)-98.7 F (37.1 C)] 98.7 F (37.1 C) (06/15 0605) Pulse Rate:  [80-94] 80  (06/15 0605) Resp:  [18-22] 22  (06/15 0605) BP: (117-128)/(61-70) 117/61 mmHg (06/15 0605) SpO2:  [82 %-92 %] 92 % (06/15 0605) Weight:  [184 lb (83.462 kg)] 184 lb (83.462 kg) (06/14 1300)    Intake/Output from previous day: 06/14 0701 - 06/15 0700 In: 1657 [I.V.:1307; NG/GT:350] Out: 1900 [Urine:300; Emesis/NG output:650] Intake/Output this shift: Total I/O In: -  Out: 200 [Urine:200]  General appearance: alert, cooperative and no distress GI: soft, NT, mild distension, no peritoneal signs, NG with brown output  Lab Results:   [facility] 12/17/11 0625 12/16/11 1455  WBC 3.2* 7.0  HGB 12.7* 14.8  HCT 38.7* 44.4  PLT 131* 151   BMET  Basename 12/17/11 0625 12/16/11 1455 12/16/11 0620  NA 138 -- 141  K 4.2 -- 4.8  CL 103 -- 103  CO2 27 -- 27  GLUCOSE 140* -- 140*  BUN 28* -- 29*  CREATININE 1.01 1.02 --  CALCIUM 9.0 -- 10.3   PT/INR  Basename 12/17/11 0625  LABPROT 16.0*  INR 1.25   ABG No results found for this basename: PHART:2,PCO2:2,PO2:2,HCO3:2 in the last 72 hours  Studies/Results: Ct Abdomen Pelvis W Contrast  12/16/2011  *RADIOLOGY REPORT*  Clinical Data: Low abdominal pain with nausea.  History of Parkinson's disease.  CT ABDOMEN AND PELVIS WITH CONTRAST  Technique:  Multidetector CT imaging of the abdomen and pelvis was performed following the standard protocol during bolus administration of intravenous contrast.  Contrast: OMNIPAQUE IOHEXOL 300 MG/ML  SOLN  Comparison: Limited correlation is made with a lumbar MRI 11/06/2007.  Findings: There is atelectasis or scarring in both lung bases. There is a small granuloma in the right lower lobe.  No significant pleural effusion is present.  The stomach, proximal and mid small bowel are diffusely distended  with air fluid levels.  There is an apparent focal transition point in the left infraumbilical area anteriorly.  The point of transition is best seen on coronal image 29.  The distal small bowel and colon are decompressed.  There is fecalization of the small bowel proximal to this apparent level of obstruction.  No adjacent soft tissue mass is identified.  There is no significant bowel wall thickening or pneumatosis.  There is mild mesenteric edema without focal extraluminal fluid or air collection.  The liver, gallbladder and biliary system appear normal.  There is a 1.5 cm low density lesion in the pancreatic head on image 36. The pancreas appears mildly atrophied without other focal abnormality or ductal dilatation.  The spleen, adrenal glands and kidneys appear normal.  There are no enlarged abdominal pelvic lymph nodes.  The bladder, prostate gland and seminal vesicles appear unremarkable for age. There is a left supraumbilical hernia containing only fat (removed from the area of bowel transition).  There are also small inguinal hernias containing only fat.  There extensive postsurgical changes in the spine status post multilevel fusion.  There is a lower thoracic spinal stimulator. There is severe asymmetric right hip arthropathy.  IMPRESSION:  1.  Partial small-bowel obstruction.  The transition point appears to be within the mid ileum in the left infraumbilical region as described above.  Fecalization of the small bowel proximal to the level of obstruction suggests some chronicity. 2.  No evidence of bowel perforation  or abscess. 3.  A small low-density lesion within the pancreatic head is likely incidental. Given the spinal stimulator and presumed inability to undergo MRI, I would suggest CT followup in approximately 6 months to assess stability.  Original Report Authenticated By: Gerrianne Scale, M.D.    Anti-infectives: Anti-infectives    None      Assessment/Plan: s/p * No surgery found  * Feels better but still with significant NG output, since he is improving, I think that it would be okay to continue NG output for now.  NPO.  Increase IV fluids to maintenance rate.  LOS: 1 day    Lodema Pilot DAVID 12/17/2011

## 2011-12-17 NOTE — Progress Notes (Signed)
This was output

## 2011-12-17 NOTE — Progress Notes (Signed)
DAILY PROGRESS NOTE                              [facility]  SUBJECTIVE: His wife and his son at bedside. Nausea improved after the NG tube  OBJECTIVE: BP 117/61  Pulse 80  Temp 98.7 F (37.1 C) (Oral)  Resp 22  Ht 5\' 9"  (1.753 m)  Wt 83.462 kg (184 lb)  BMI 27.17 kg/m2  SpO2 92%  Intake/Output Summary (Last 24 hours) at 12/17/11 1112 Last data filed at 12/17/11 0800  Gross per 24 hour  Intake   1307 ml  Output   1500 ml  Net   -193 ml                      Weight change:  Physical Exam: General: Alert and awake oriented x3 not in any acute distress. HEENT: anicteric sclera, pupils equal reactive to light and accommodation CVS: S1-S2 heard, no murmur rubs or gallops Chest: clear to auscultation bilaterally, no wheezing rales or rhonchi Abdomen:  No bowel sounds, soft, nontender, nondistended, no organomegaly Neuro: Cranial nerves II-XII intact, no focal neurological deficits Extremities: no cyanosis, no clubbing or edema noted bilaterally   Lab Results:  Basename 12/17/11 0625 12/16/11 1455 12/16/11 0620  NA 138 -- 141  K 4.2 -- 4.8  CL 103 -- 103  CO2 27 -- 27  GLUCOSE 140* -- 140*  BUN 28* -- 29*  CREATININE 1.01 1.02 --  CALCIUM 9.0 -- 10.3  MG -- 2.2 --  PHOS -- -- --    Basename 12/17/11 0625 12/16/11 0620  AST 28 29  ALT 18 6  ALKPHOS 72 86  BILITOT 1.4* 0.7  PROT 6.1 7.6  ALBUMIN 3.1* 4.0    Basename 12/16/11 0620  LIPASE 21  AMYLASE --    Basename 12/17/11 0625 12/16/11 1455 12/16/11 0620  WBC 3.2* 7.0 --  NEUTROABS -- -- 7.4  HGB 12.7* 14.8 --  HCT 38.7* 44.4 --  MCV 93.9 94.5 --  PLT 131* 151 --   No results found for this basename: CKTOTAL:3,CKMB:3,CKMBINDEX:3,TROPONINI:3 in the last 72 hours No components found with this basename: POCBNP:3 No results found for this basename: DDIMER:2 in the last 72 hours No results found for this basename: HGBA1C:2 in the last 72 hours No results found for this  basename: CHOL:2,HDL:2,LDLCALC:2,TRIG:2,CHOLHDL:2,LDLDIRECT:2 in the last 72 hours No results found for this basename: TSH,T4TOTAL,FREET3,T3FREE,THYROIDAB in the last 72 hours No results found for this basename: VITAMINB12:2,FOLATE:2,FERRITIN:2,TIBC:2,IRON:2,RETICCTPCT:2 in the last 72 hours  Micro Results: No results found for this or any previous visit (from the past 240 hour(s)).  Studies/Results: Ct Abdomen Pelvis W Contrast  12/16/2011  *RADIOLOGY REPORT*  Clinical Data: Low abdominal pain with nausea.  History of Parkinson's disease.  CT ABDOMEN AND PELVIS WITH CONTRAST  Technique:  Multidetector CT imaging of the abdomen and pelvis was performed following the standard protocol during bolus administration of intravenous contrast.  Contrast: OMNIPAQUE IOHEXOL 300 MG/ML  SOLN  Comparison: Limited correlation is made with a lumbar MRI 11/06/2007.  Findings: There is atelectasis or scarring in both lung bases. There is a small granuloma in the right lower lobe.  No significant pleural effusion is present.  The stomach, proximal and mid small bowel are diffusely distended with air fluid levels.  There is an apparent focal transition point in the left infraumbilical area anteriorly.  The point of  transition is best seen on coronal image 29.  The distal small bowel and colon are decompressed.  There is fecalization of the small bowel proximal to this apparent level of obstruction.  No adjacent soft tissue mass is identified.  There is no significant bowel wall thickening or pneumatosis.  There is mild mesenteric edema without focal extraluminal fluid or air collection.  The liver, gallbladder and biliary system appear normal.  There is a 1.5 cm low density lesion in the pancreatic head on image 36. The pancreas appears mildly atrophied without other focal abnormality or ductal dilatation.  The spleen, adrenal glands and kidneys appear normal.  There are no enlarged abdominal pelvic lymph nodes.  The  bladder, prostate gland and seminal vesicles appear unremarkable for age. There is a left supraumbilical hernia containing only fat (removed from the area of bowel transition).  There are also small inguinal hernias containing only fat.  There extensive postsurgical changes in the spine status post multilevel fusion.  There is a lower thoracic spinal stimulator. There is severe asymmetric right hip arthropathy.  IMPRESSION:  1.  Partial small-bowel obstruction.  The transition point appears to be within the mid ileum in the left infraumbilical region as described above.  Fecalization of the small bowel proximal to the level of obstruction suggests some chronicity. 2.  No evidence of bowel perforation or abscess. 3.  A small low-density lesion within the pancreatic head is likely incidental. Given the spinal stimulator and presumed inability to undergo MRI, I would suggest CT followup in approximately 6 months to assess stability.  Original Report Authenticated By: Gerrianne Scale, M.D.   Medications: Scheduled Meds:   . carbidopa-levodopa  1 tablet Oral QHS  . carbidopa-levodopa  3 tablet Oral TID WC  . DULoxetine  60 mg Oral Daily  . heparin  5,000 Units Subcutaneous Q8H  . rivastigmine  9.5 mg Transdermal Daily  . DISCONTD: sodium chloride   Intravenous STAT  . DISCONTD: albuterol  2.5 mg Nebulization Q6H  . DISCONTD: iohexol  20 mL Oral Q1 Hr x 2   Continuous Infusions:   . dextrose 5 % and 0.45 % NaCl with KCl 10 mEq/L 75 mL/hr (12/17/11 0609)   PRN Meds:.acetaminophen, acetaminophen, albuterol, ondansetron (ZOFRAN) IV, ondansetron, DISCONTD: ondansetron (ZOFRAN) IV  ASSESSMENT & PLAN: Principal Problem:  *SBO (small bowel obstruction) Active Problems:  Acute kidney injury  Thrombocytopenia   Partial small bowel obstruction -CT scan of abdomen pelvis showed partial SBO. -There is sacralization of the small bowel, proximal to the transition point indicates  chronicity. -Conservative management for now with NG tube for suction. -General surgeon is following.  Dehydration -Secondary to poor oral intake from the small bowel obstruction. -Hydrate with IV fluids, check BMP in the morning.  Parkinson disease -Continue current medications. -Clamp the NG tube after giving the medications.  Mild thrombocytopenia -Followup with CBC.   LOS: 1 day   Shanitra Phillippi A 12/17/2011, 11:12 AM

## 2011-12-18 ENCOUNTER — Inpatient Hospital Stay (HOSPITAL_COMMUNITY): Payer: Medicare Other

## 2011-12-18 DIAGNOSIS — D696 Thrombocytopenia, unspecified: Secondary | ICD-10-CM

## 2011-12-18 DIAGNOSIS — N179 Acute kidney failure, unspecified: Secondary | ICD-10-CM

## 2011-12-18 DIAGNOSIS — K56609 Unspecified intestinal obstruction, unspecified as to partial versus complete obstruction: Secondary | ICD-10-CM

## 2011-12-18 DIAGNOSIS — G20 Parkinson's disease: Secondary | ICD-10-CM

## 2011-12-18 LAB — DIFFERENTIAL
Eosinophils Absolute: 0.1 10*3/uL (ref 0.0–0.7)
Eosinophils Relative: 2 % (ref 0–5)
Lymphs Abs: 0.9 10*3/uL (ref 0.7–4.0)
Monocytes Relative: 17 % — ABNORMAL HIGH (ref 3–12)

## 2011-12-18 LAB — CBC
Hemoglobin: 12.4 g/dL — ABNORMAL LOW (ref 13.0–17.0)
MCH: 31.3 pg (ref 26.0–34.0)
MCV: 93.9 fL (ref 78.0–100.0)
RBC: 3.96 MIL/uL — ABNORMAL LOW (ref 4.22–5.81)

## 2011-12-18 LAB — BASIC METABOLIC PANEL
CO2: 25 mEq/L (ref 19–32)
Glucose, Bld: 149 mg/dL — ABNORMAL HIGH (ref 70–99)
Potassium: 3.8 mEq/L (ref 3.5–5.1)
Sodium: 135 mEq/L (ref 135–145)

## 2011-12-18 MED ORDER — POTASSIUM CHLORIDE 2 MEQ/ML IV SOLN
INTRAVENOUS | Status: DC
  Administered 2011-12-19 – 2011-12-22 (×4): via INTRAVENOUS
  Filled 2011-12-18 (×10): qty 1000

## 2011-12-18 NOTE — Progress Notes (Signed)
Will obtain PT/OT evaluation to assist pt with mobility.  He uses a rolling walker and a brace at home.  Wife brought up brace for pt today.

## 2011-12-18 NOTE — Progress Notes (Signed)
Pulled NGT out x2,re-inserted. MD aware

## 2011-12-18 NOTE — Progress Notes (Signed)
DAILY PROGRESS NOTE                              [facility]  SUBJECTIVE: His wife is at bedside. He was confused overnight and pulled the NG tube several times. No flatus, no bowel movements. He is awake alert.  OBJECTIVE: BP 136/63  Pulse 79  Temp 98.6 F (37 C) (Oral)  Resp 20  Ht 5\' 9"  (1.753 m)  Wt 83.462 kg (184 lb)  BMI 27.17 kg/m2  SpO2 93%  Intake/Output Summary (Last 24 hours) at 12/18/11 1110 Last data filed at 12/18/11 7829  Gross per 24 hour  Intake   3040 ml  Output   1510 ml  Net   1530 ml                      Weight change:  Physical Exam: General: Alert and awake oriented x3 not in any acute distress. HEENT: anicteric sclera, pupils equal reactive to light and accommodation CVS: S1-S2 heard, no murmur rubs or gallops Chest: clear to auscultation bilaterally, no wheezing rales or rhonchi Abdomen:  No bowel sounds, soft, nontender, nondistended, no organomegaly Neuro: Cranial nerves II-XII intact, no focal neurological deficits Extremities: no cyanosis, no clubbing or edema noted bilaterally   Lab Results:  Basename 12/18/11 0011 12/17/11 0625 12/16/11 1455  NA 135 138 --  K 3.8 4.2 --  CL 101 103 --  CO2 25 27 --  GLUCOSE 149* 140* --  BUN 16 28* --  CREATININE 0.83 1.01 --  CALCIUM 8.6 9.0 --  MG -- -- 2.2  PHOS -- -- --    [facility] 12/17/11 0625 12/16/11 0620  AST 28 29  ALT 18 6  ALKPHOS 72 86  BILITOT 1.4* 0.7  PROT 6.1 7.6  ALBUMIN 3.1* 4.0    Basename 12/16/11 0620  LIPASE 21  AMYLASE --    Basename 12/18/11 0011 12/17/11 0625 12/16/11 0620  WBC 3.5* 3.2* --  NEUTROABS 1.9 -- 7.4  HGB 12.4* 12.7* --  HCT 37.2* 38.7* --  MCV 93.9 93.9 --  PLT 124* 131* --   No results found for this basename: CKTOTAL:3,CKMB:3,CKMBINDEX:3,TROPONINI:3 in the last 72 hours No components found with this basename: POCBNP:3 No results found for this basename: DDIMER:2 in the last 72 hours No results found for  this basename: HGBA1C:2 in the last 72 hours No results found for this basename: CHOL:2,HDL:2,LDLCALC:2,TRIG:2,CHOLHDL:2,LDLDIRECT:2 in the last 72 hours No results found for this basename: TSH,T4TOTAL,FREET3,T3FREE,THYROIDAB in the last 72 hours No results found for this basename: VITAMINB12:2,FOLATE:2,FERRITIN:2,TIBC:2,IRON:2,RETICCTPCT:2 in the last 72 hours  Micro Results: No results found for this or any previous visit (from the past 240 hour(s)).  Studies/Results: Ct Abdomen Pelvis W Contrast  12/16/2011  *RADIOLOGY REPORT*  Clinical Data: Low abdominal pain with nausea.  History of Parkinson's disease.  CT ABDOMEN AND PELVIS WITH CONTRAST  Technique:  Multidetector CT imaging of the abdomen and pelvis was performed following the standard protocol during bolus administration of intravenous contrast.  Contrast: OMNIPAQUE IOHEXOL 300 MG/ML  SOLN  Comparison: Limited correlation is made with a lumbar MRI 11/06/2007.  Findings: There is atelectasis or scarring in both lung bases. There is a small granuloma in the right lower lobe.  No significant pleural effusion is present.  The stomach, proximal and mid small bowel are diffusely distended with air fluid levels.  There is an apparent focal  transition point in the left infraumbilical area anteriorly.  The point of transition is best seen on coronal image 29.  The distal small bowel and colon are decompressed.  There is fecalization of the small bowel proximal to this apparent level of obstruction.  No adjacent soft tissue mass is identified.  There is no significant bowel wall thickening or pneumatosis.  There is mild mesenteric edema without focal extraluminal fluid or air collection.  The liver, gallbladder and biliary system appear normal.  There is a 1.5 cm low density lesion in the pancreatic head on image 36. The pancreas appears mildly atrophied without other focal abnormality or ductal dilatation.  The spleen, adrenal glands and kidneys  appear normal.  There are no enlarged abdominal pelvic lymph nodes.  The bladder, prostate gland and seminal vesicles appear unremarkable for age. There is a left supraumbilical hernia containing only fat (removed from the area of bowel transition).  There are also small inguinal hernias containing only fat.  There extensive postsurgical changes in the spine status post multilevel fusion.  There is a lower thoracic spinal stimulator. There is severe asymmetric right hip arthropathy.  IMPRESSION:  1.  Partial small-bowel obstruction.  The transition point appears to be within the mid ileum in the left infraumbilical region as described above.  Fecalization of the small bowel proximal to the level of obstruction suggests some chronicity. 2.  No evidence of bowel perforation or abscess. 3.  A small low-density lesion within the pancreatic head is likely incidental. Given the spinal stimulator and presumed inability to undergo MRI, I would suggest CT followup in approximately 6 months to assess stability.  Original Report Authenticated By: Gerrianne Scale, M.D.   Medications: Scheduled Meds:    . carbidopa-levodopa  1 tablet Oral QHS  . carbidopa-levodopa  3 tablet Oral TID WC  . DULoxetine  60 mg Oral Daily  . heparin  5,000 Units Subcutaneous Q8H  . rivastigmine  9.5 mg Transdermal Daily   Continuous Infusions:    . dextrose 5 % and 0.45 % NaCl with KCl 10 mEq/L 125 mL/hr at 12/18/11 0735   PRN Meds:.acetaminophen, acetaminophen, albuterol, menthol-cetylpyridinium, ondansetron (ZOFRAN) IV, ondansetron, phenol  ASSESSMENT & PLAN: Principal Problem:  *SBO (small bowel obstruction) Active Problems:  Acute kidney injury  Thrombocytopenia   Partial small bowel obstruction -CT scan of abdomen pelvis showed partial SBO. -There is fecalization of the small bowel, proximal to the transition point indicates chronicity. -Conservative management for now with NG tube for suction. -General surgeon  is following.  -Abdominal x-ray today to assess his progress and decide about surgery versus continued conservative management.  Dehydration -Secondary to poor oral intake from the small bowel obstruction. -Hydrate with IV fluids, this is resolved.  Parkinson disease -Continue current medications. -Clamp the NG tube after giving the medications.  Mild thrombocytopenia -Followup with CBC.  Confusion -This is acute delirium (sundowning) on top of chronic dementia. -It's not uncommon to recur at night.   LOS: 2 days   Abad Manard A 12/18/2011, 11:10 AM

## 2011-12-18 NOTE — Progress Notes (Signed)
Discussed with pt's wife events of the night as she came up to visit her husband.

## 2011-12-18 NOTE — Progress Notes (Signed)
  Subjective: Confused overnight and NG pulled out several times.  Denies pain.  No BM  Objective: Vital signs in last 24 hours: Temp:  [98.4 F (36.9 C)-98.6 F (37 C)] 98.6 F (37 C) (06/16 0631) Pulse Rate:  [79-81] 79  (06/16 0631) Resp:  [19-20] 20  (06/16 0631) BP: (122-146)/(58-67) 136/63 mmHg (06/16 0631) SpO2:  [92 %-93 %] 93 % (06/16 0631)    Intake/Output from previous day: 06/15 0701 - 06/16 0700 In: 3040 [I.V.:3040] Out: 1710 [Urine:1410; Emesis/NG output:300] Intake/Output this shift:    General appearance: cooperative and no distress GI: soft, NT, more distended today,  NG output thinner than yesterday but still bilious, no peritoneal signs. Neurologic: Mental status:slowed mentation but responds appropriately when asked.  Lab Results:   Basename 12/18/11 0011 12/17/11 0625  WBC 3.5* 3.2*  HGB 12.4* 12.7*  HCT 37.2* 38.7*  PLT 124* 131*   BMET  Basename 12/18/11 0011 12/17/11 0625  NA 135 138  K 3.8 4.2  CL 101 103  CO2 25 27  GLUCOSE 149* 140*  BUN 16 28*  CREATININE 0.83 1.01  CALCIUM 8.6 9.0   PT/INR  Basename 12/17/11 0625  LABPROT 16.0*  INR 1.25   ABG No results found for this basename: PHART:2,PCO2:2,PO2:2,HCO3:2 in the last 72 hours  Studies/Results: No results found.  Anti-infectives: Anti-infectives    None      Assessment/Plan: s/p * No surgery found * He is more distended today but not sure if this is due to progression of bowel obstruction or if due to NG being out overnight.  Even now, the tube appears to be pretty far out of the nose and I doubt that this is even very far in the stomach.  will check some abdominal xrays, if tube is in the stomach, I would consider surgery for lack of progression despite bowel rest, but if tube out of the stomach, then I think that it would be okay to advance tube and trial NG decompression for another day  LOS: 2 days    Lodema Pilot DAVID 12/18/2011

## 2011-12-18 NOTE — ED Provider Notes (Signed)
Medical screening examination/treatment/procedure(s) were performed by non-physician practitioner and as supervising physician I was immediately available for consultation/collaboration.  Geoffery Lyons, MD 12/18/11 [phone]

## 2011-12-19 ENCOUNTER — Inpatient Hospital Stay (HOSPITAL_COMMUNITY): Payer: Medicare Other

## 2011-12-19 DIAGNOSIS — N179 Acute kidney failure, unspecified: Secondary | ICD-10-CM

## 2011-12-19 DIAGNOSIS — D696 Thrombocytopenia, unspecified: Secondary | ICD-10-CM

## 2011-12-19 DIAGNOSIS — G20 Parkinson's disease: Secondary | ICD-10-CM

## 2011-12-19 DIAGNOSIS — K56609 Unspecified intestinal obstruction, unspecified as to partial versus complete obstruction: Secondary | ICD-10-CM

## 2011-12-19 LAB — BASIC METABOLIC PANEL
CO2: 27 mEq/L (ref 19–32)
Calcium: 8.7 mg/dL (ref 8.4–10.5)
Chloride: 103 mEq/L (ref 96–112)
Glucose, Bld: 134 mg/dL — ABNORMAL HIGH (ref 70–99)
Potassium: 3.6 mEq/L (ref 3.5–5.1)
Sodium: 139 mEq/L (ref 135–145)

## 2011-12-19 NOTE — Progress Notes (Signed)
DAILY PROGRESS NOTE                              [facility]  SUBJECTIVE: His wife is at bedside. Sleepy this morning.  OBJECTIVE: BP 165/70  Pulse 76  Temp 99 F (37.2 C) (Oral)  Resp 20  Ht 5\' 9"  (1.753 m)  Wt 83.462 kg (184 lb)  BMI 27.17 kg/m2  SpO2 95%  Intake/Output Summary (Last 24 hours) at 12/19/11 1002 Last data filed at 12/19/11 0600  Gross per 24 hour  Intake   2033 ml  Output   1650 ml  Net    383 ml                      Weight change:  Physical Exam: General: Sleepy, but easy to arouse. not in any acute distress. HEENT: anicteric sclera, pupils equal reactive to light and accommodation CVS: S1-S2 heard, no murmur rubs or gallops Chest: clear to auscultation bilaterally, no wheezing rales or rhonchi Abdomen:  No bowel sounds, soft, nontender, nondistended, no organomegaly Neuro: Cranial nerves II-XII intact, no focal neurological deficits Extremities: no cyanosis, no clubbing or edema noted bilaterally   Lab Results:  Basename 12/19/11 0735 12/18/11 0011 12/16/11 1455  NA 139 135 --  K 3.6 3.8 --  CL 103 101 --  CO2 27 25 --  GLUCOSE 134* 149* --  BUN 10 16 --  CREATININE 0.77 0.83 --  CALCIUM 8.7 8.6 --  MG -- -- 2.2  PHOS -- -- --    Basename 12/17/11 0625  AST 28  ALT 18  ALKPHOS 72  BILITOT 1.4*  PROT 6.1  ALBUMIN 3.1*   No results found for this basename: LIPASE:2,AMYLASE:2 in the last 72 hours  Basename 12/18/11 0011 12/17/11 0625  WBC 3.5* 3.2*  NEUTROABS 1.9 --  HGB 12.4* 12.7*  HCT 37.2* 38.7*  MCV 93.9 93.9  PLT 124* 131*   No results found for this basename: CKTOTAL:3,CKMB:3,CKMBINDEX:3,TROPONINI:3 in the last 72 hours No components found with this basename: POCBNP:3 No results found for this basename: DDIMER:2 in the last 72 hours No results found for this basename: HGBA1C:2 in the last 72 hours No results found for this basename: CHOL:2,HDL:2,LDLCALC:2,TRIG:2,CHOLHDL:2,LDLDIRECT:2 in the  last 72 hours No results found for this basename: TSH,T4TOTAL,FREET3,T3FREE,THYROIDAB in the last 72 hours No results found for this basename: VITAMINB12:2,FOLATE:2,FERRITIN:2,TIBC:2,IRON:2,RETICCTPCT:2 in the last 72 hours  Micro Results: No results found for this or any previous visit (from the past 240 hour(s)).  Studies/Results: Ct Abdomen Pelvis W Contrast  12/16/2011  *RADIOLOGY REPORT*  Clinical Data: Low abdominal pain with nausea.  History of Parkinson's disease.  CT ABDOMEN AND PELVIS WITH CONTRAST  Technique:  Multidetector CT imaging of the abdomen and pelvis was performed following the standard protocol during bolus administration of intravenous contrast.  Contrast: OMNIPAQUE IOHEXOL 300 MG/ML  SOLN  Comparison: Limited correlation is made with a lumbar MRI 11/06/2007.  Findings: There is atelectasis or scarring in both lung bases. There is a small granuloma in the right lower lobe.  No significant pleural effusion is present.  The stomach, proximal and mid small bowel are diffusely distended with air fluid levels.  There is an apparent focal transition point in the left infraumbilical area anteriorly.  The point of transition is best seen on coronal image 29.  The distal small bowel and colon are decompressed.  There is  fecalization of the small bowel proximal to this apparent level of obstruction.  No adjacent soft tissue mass is identified.  There is no significant bowel wall thickening or pneumatosis.  There is mild mesenteric edema without focal extraluminal fluid or air collection.  The liver, gallbladder and biliary system appear normal.  There is a 1.5 cm low density lesion in the pancreatic head on image 36. The pancreas appears mildly atrophied without other focal abnormality or ductal dilatation.  The spleen, adrenal glands and kidneys appear normal.  There are no enlarged abdominal pelvic lymph nodes.  The bladder, prostate gland and seminal vesicles appear unremarkable for  age. There is a left supraumbilical hernia containing only fat (removed from the area of bowel transition).  There are also small inguinal hernias containing only fat.  There extensive postsurgical changes in the spine status post multilevel fusion.  There is a lower thoracic spinal stimulator. There is severe asymmetric right hip arthropathy.  IMPRESSION:  1.  Partial small-bowel obstruction.  The transition point appears to be within the mid ileum in the left infraumbilical region as described above.  Fecalization of the small bowel proximal to the level of obstruction suggests some chronicity. 2.  No evidence of bowel perforation or abscess. 3.  A small low-density lesion within the pancreatic head is likely incidental. Given the spinal stimulator and presumed inability to undergo MRI, I would suggest CT followup in approximately 6 months to assess stability.  Original Report Authenticated By: Gerrianne Scale, M.D.   Medications: Scheduled Meds:    . carbidopa-levodopa  1 tablet Oral QHS  . carbidopa-levodopa  3 tablet Oral TID WC  . DULoxetine  60 mg Oral Daily  . heparin  5,000 Units Subcutaneous Q8H  . rivastigmine  9.5 mg Transdermal Daily   Continuous Infusions:    . dextrose 5 % and 0.45% NaCl 1,000 mL with potassium chloride 20 mEq infusion 75 mL/hr at 12/19/11 0828  . DISCONTD: dextrose 5 % and 0.45 % NaCl with KCl 10 mEq/L 125 mL/hr at 12/18/11 0735   PRN Meds:.acetaminophen, acetaminophen, albuterol, menthol-cetylpyridinium, ondansetron (ZOFRAN) IV, ondansetron, phenol  ASSESSMENT & PLAN: Principal Problem:  *SBO (small bowel obstruction) Active Problems:  Acute kidney injury  Thrombocytopenia   Partial small bowel obstruction -CT scan of abdomen pelvis showed partial SBO. -There is fecalization of the small bowel, proximal to the transition point indicates chronicity. -Conservative management for now with NG tube for suction. -General surgeon is following.  -Abdominal  x-ray today showed improved gas pattern.  Dehydration -Secondary to poor oral intake from the small bowel obstruction. -Hydrate with IV fluids, this is resolved.  Parkinson disease -Continue current medications. -Clamp the NG tube after giving the medications.  Mild thrombocytopenia -Followup with CBC.  Confusion -This is acute delirium (sundowning) on top of chronic dementia. -It's not uncommon to recur at night.   LOS: 3 days   Jaylend Reiland A 12/19/2011, 10:02 AM

## 2011-12-19 NOTE — Progress Notes (Signed)
SBO (small bowel obstruction)  Subjective: Hard to get history - denies pain, maybe mild nausea.  Objective: Vital signs in last 24 hours: Temp:  [99 F (37.2 C)-100.5 F (38.1 C)] 99 F (37.2 C) (06/17 0549) Pulse Rate:  [62-76] 76  (06/17 0549) Resp:  [20-25] 20  (06/17 0549) BP: (147-165)/(67-70) 165/70 mmHg (06/17 0549) SpO2:  [92 %-95 %] 95 % (06/17 0549) Last BM Date: 12/18/11  Intake/Output from previous day: 06/16 0701 - 06/17 0700 In: 2033 [I.V.:2033] Out: 1650 [Urine:1450; Emesis/NG output:200] Intake/Output this shift:    General appearance: fatigued, no distress, slowed mentation and not really oriented to place Resp: clear to auscultation bilaterally GI: Soft, seems a little distended, not tender,? of palpable bowel loops. No BS this am  Lab Results:  No results found for this or any previous visit (from the past 24 hour(s)).   Studies/Results Radiology     MEDS, Scheduled    . carbidopa-levodopa  1 tablet Oral QHS  . carbidopa-levodopa  3 tablet Oral TID WC  . DULoxetine  60 mg Oral Daily  . heparin  5,000 Units Subcutaneous Q8H  . rivastigmine  9.5 mg Transdermal Daily     Assessment: SBO (small bowel obstruction) Doesn't seem improved, but mental status makes assessment difficult  Plan: Will recheck 2 way Abd. May need to go to surgery today  LOS: 3 days    Currie Paris, MD, [facility], [place] [phone]   12/19/2011 8:16 AM

## 2011-12-19 NOTE — Evaluation (Signed)
Physical Therapy Evaluation Patient Details Name: RILEY HALLUM MRN: [id number] DOB: [birth year] Today's Date: 12/19/2011 Time: 1132-1219 PT Time Calculation (min): 47 min  PT Assessment / Plan / Recommendation Clinical Impression  Mr. Rhine is 78 y/o male admitted to [facility] with SBO which is being handled conservatively at this point in time with NG tube suction. With PMH of Parkinsons disease Mr. Saber presents to PT today with decreased mobility, strength and balance affecting his prior level of function, independence and quality of life. Will benefit physical therapy in the acute setting to address these and the below problem list so as to maximize his function and decrease burden of care at the next venue. Rec CIR for f/u therapies.     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, pt also with Parkinsons Disease         Mobility  Bed Mobility Bed Mobility: Rolling Right;Right Sidelying to Sit;Sitting - Scoot to [name] Rolling Right: 1: +2 Total assist Rolling Right: Patient Percentage: 50% Right Sidelying to Sit: 1: +2 Total assist Right Sidelying to Sit: Patient Percentage: 50% Sitting - Scoot to Edge of Bed: 3: Mod assist Details for Bed Mobility Assistance: increased time for patient to process, verbal and facilitatory cues to sequence Transfers Transfers: Sit to Stand;Stand to Sit Sit to Stand: 1: +2 Total assist Sit to Stand: Patient Percentage: 80% Stand to Sit: 4: Min assist Details for Transfer Assistance: sequencing cues and facilitation for stability initially upon standing as  well as improved posture; sequencing cues and facilitation to initiate sitting, hand over hand to guide hands to arm rests Ambulation/Gait Ambulation/Gait Assistance: 4: Min assist Ambulation Distance (Feet): 20 Feet Assistive device: Rolling walker Ambulation/Gait Assistance Details: shuffled and flexed gait, more difficulty in tight spaces and turning, speeds up on straight more open path; assist to negotiate RW and for improved posture and stabiilty Gait Pattern: Decreased stride length;Trunk flexed;Shuffle    Exercises     PT Diagnosis: Difficulty walking;Abnormality of gait;Generalized weakness  PT Problem List: Decreased strength;Decreased activity tolerance;Decreased balance;Decreased mobility;Decreased coordination;Decreased range of motion PT Treatment Interventions: DME instruction;Gait training;Stair training;Functional mobility training;Therapeutic activities;Therapeutic exercise;Balance training;Patient/family education;Neuromuscular re-education   PT Goals Acute Rehab PT Goals PT Goal Formulation: With patient Time For Goal Achievement: 01/02/12 Pt will Roll Supine to Right Side: with modified independence PT Goal: Rolling Supine to Right Side - Progress: Goal set today Pt will Roll Supine to Left Side: with modified independence PT Goal: Rolling Supine to Left Side - Progress: Goal set today Pt will go Supine/Side to Sit: with modified independence PT Goal: Supine/Side to Sit - Progress: Goal set today Pt will go Sit to Supine/Side: with modified independence PT Goal: Sit to Supine/Side - Progress: Goal set today Pt will go Sit to Stand: with supervision PT Goal: Sit to Stand - Progress: Goal set today Pt will go Stand to Sit: with supervision PT Goal: Stand to Sit - Progress: Goal set today Pt will Transfer Bed to Chair/Chair to Bed: with supervision PT Transfer Goal: Bed to Chair/Chair to Bed - Progress: Goal set  today Pt will Stand: 3 - 5 min;with supervision;with no  upper extremity support PT Goal: Stand - Progress: Goal set today Pt will Ambulate: 51 - 150 feet;with supervision;with least restrictive assistive device PT Goal: Ambulate - Progress: Goal set today Pt will Perform Home Exercise Program: Independently PT Goal: Perform Home Exercise Program - Progress: Goal set today  Visit Information  Last PT Received On: 12/19/11 Assistance Needed: +2 PT/OT Co-Evaluation/Treatment: Yes    Subjective Data  Subjective: Pt became emotional during our session today because of his limitations with mobility.  Patient Stated Goal: Wife would like patient to be at a level she can care for at home.    Prior Functioning  Home Living Lives With: Spouse Available Help at Discharge: Family;Personal care attendant (personal care attendant 2x/wk to assist with dressing/bathin) Type of Home: House Home Access: Stairs to enter [employer] of Steps: 1 Home Layout: Two level;Able to live on main level with bedroom/bathroom Bathroom Shower/Tub: Health visitor:  (3in1 over toilet) Home Adaptive Equipment: Bedside commode/3-in-1;Walker - four wheeled;Grab bars in shower;Tub transfer bench Prior Function Able to Take Stairs?: Yes Driving: No Vocation: Retired Comments: Pt recently diagnosed with PD, spent a month in outpatient therapy (4x/wk for an hour a day) and was moving very well after that, wears AFO all the time and uses rollator Communication Communication:  (slight stutter)    Cognition  Overall Cognitive Status: Appears within functional limits for tasks assessed/performed Arousal/Alertness: Awake/alert Behavior During Session: [facility] for tasks performed Cognition - Other Comments: slower processing, has been lethargic some recently, seems aware of the situation and became very emotional during our eval today    Extremity/Trunk Assessment Right Upper Extremity Assessment RUE ROM/Strength/Tone:  (see OT note) Left Upper  Extremity Assessment LUE ROM/Strength/Tone:  (see OT note) Right Lower Extremity Assessment RLE ROM/Strength/Tone: Deficits RLE ROM/Strength/Tone Deficits: in sitting pt unable to get full extension in his knee with a pretty firm end feel, grossly 4-/5 knee extension and flexion RLE Sensation:  (not tested, no c/o numbness or tingling) RLE Coordination: Deficits RLE Coordination Deficits: bradykinetic movement, shuffled gait Left Lower Extremity Assessment LLE ROM/Strength/Tone: Deficits LLE ROM/Strength/Tone Deficits: in sitting pt unable to get full extension in his knee with a pretty firm end feel, grossly 4-/5 knee extension and flexion LLE Sensation:  (not tested, no c/o numbness or tingling) LLE Coordination: Deficits LLE Coordination Deficits: bradykinetic movement, shuffled gait Trunk Assessment Trunk Assessment: Kyphotic   Balance Static Standing Balance Static Standing - Balance Support: Bilateral upper extremity supported Static Standing - Level of Assistance: 5: Stand by assistance  End of Session PT - End of Session Equipment Utilized During Treatment: Gait belt Activity Tolerance: Patient tolerated treatment well Patient left: in chair;with call bell/phone within reach Nurse Communication: Mobility status;Other (comment) (condom catheter needs replacement)   WHITLOW,Jaree Trinka HELEN 12/19/2011, 1:27 PM

## 2011-12-20 ENCOUNTER — Inpatient Hospital Stay (HOSPITAL_COMMUNITY): Payer: Medicare Other

## 2011-12-20 DIAGNOSIS — G20 Parkinson's disease: Secondary | ICD-10-CM

## 2011-12-20 DIAGNOSIS — K56609 Unspecified intestinal obstruction, unspecified as to partial versus complete obstruction: Secondary | ICD-10-CM

## 2011-12-20 DIAGNOSIS — D696 Thrombocytopenia, unspecified: Secondary | ICD-10-CM

## 2011-12-20 DIAGNOSIS — N179 Acute kidney failure, unspecified: Secondary | ICD-10-CM

## 2011-12-20 NOTE — Progress Notes (Signed)
Physical Therapy Treatment Patient Details Name: SHMIEL MORTON MRN: [id number] DOB: Nov 29, 1923 Today's Date: 12/20/2011 Time: 4098-1191 PT Time Calculation (min): 19 min  PT Assessment / Plan / Recommendation Comments on Treatment Session  NG tube just removed, pt feeling nauseous initially, started feeling better after sitting for a few minutes. Moving better but still very emotional today, feeling frustrated with everything.     Follow Up Recommendations  Inpatient Rehab    Barriers to Discharge        Equipment Recommendations  Defer to next venue    Recommendations for Other Services    Frequency Min 3X/week   Plan Discharge plan remains appropriate;Frequency remains appropriate    Precautions / Restrictions Precautions Precautions: Fall       Mobility  Bed Mobility Rolling Right: 3: Mod assist Right Sidelying to Sit: 1: +2 Total assist;HOB elevated (30 degrees) Right Sidelying to Sit: Patient Percentage: 50% Details for Bed Mobility Assistance: sequencing cues, needs time to process and initiate Transfers Sit to Stand: 1: +2 Total assist;From elevated surface;With upper extremity assist;From bed Sit to Stand: Patient Percentage: 70% Stand to Sit: 3: Mod assist;To chair/3-in-1;With upper extremity assist Details for Transfer Assistance: cues for safe hand placement and facilitation for translation of trunk over BOS and power up to stand Ambulation/Gait Ambulation/Gait Assistance: 4: Min assist Ambulation Distance (Feet): 40 Feet Assistive device: Rolling walker Ambulation/Gait Assistance Details: did not wear his AFO and corrective shoe today so ambulated with RLE on his toe to even his weight out, slightly shifted to the left (likely because of the leg length discrepancy) Gait Pattern: Trunk flexed;Shuffle;Lateral trunk lean to left    Exercises       PT Goals Acute Rehab PT Goals PT Goal: Rolling Supine to Right Side - Progress: Progressing toward goal PT Goal:  Supine/Side to Sit - Progress: Progressing toward goal PT Goal: Sit to Stand - Progress: Progressing toward goal PT Goal: Stand to Sit - Progress: Progressing toward goal PT Transfer Goal: Bed to Chair/Chair to Bed - Progress: Progressing toward goal PT Goal: Ambulate - Progress: Progressing toward goal  Visit Information  Last PT Received On: 12/20/11 Assistance Needed: +2    Subjective Data  Subjective: I feel sick.   Cognition  Overall Cognitive Status: Impaired Arousal/Alertness: Lethargic (easily arousable) Orientation Level: Appears intact for tasks assessed Behavior During Session: Flat affect (emotional) Cognition - Other Comments: slower processing    Balance     End of Session PT - End of Session Equipment Utilized During Treatment: Gait belt Activity Tolerance: Treatment limited secondary to medical complications (Comment);Other (comment) (pt feeling nauseous) Patient left: in chair;with call bell/phone within reach;with family/visitor present Nurse Communication: Mobility status;Other (comment) (nausea)    WHITLOW,Crespin Forstrom HELEN 12/20/2011, 12:55 PM

## 2011-12-20 NOTE — Progress Notes (Signed)
DAILY PROGRESS NOTE                              [facility]AL [facility]  SUBJECTIVE: His son is at bedside, has some nausea but denies abdominal pain.  OBJECTIVE: BP 158/82  Pulse 78  Temp 98.6 F (37 C) (Oral)  Resp 18  Ht 5\' 9"  (1.753 m)  Wt 83.462 kg (184 lb)  BMI 27.17 kg/m2  SpO2 95%  Intake/Output Summary (Last 24 hours) at 12/20/11 1011 Last data filed at 12/20/11 0940  Gross per 24 hour  Intake 1855.25 ml  Output   2375 ml  Net -519.75 ml                      Weight change:  Physical Exam: General: Sleepy, but easy to arouse. not in any acute distress. HEENT: anicteric sclera, pupils equal reactive to light and accommodation CVS: S1-S2 heard, no murmur rubs or gallops Chest: clear to auscultation bilaterally, no wheezing rales or rhonchi Abdomen:  No bowel sounds, soft, nontender, nondistended, no organomegaly Neuro: Cranial nerves II-XII intact, no focal neurological deficits Extremities: no cyanosis, no clubbing or edema noted bilaterally   Lab Results:  Basename 12/19/11 0735 12/18/11 0011  NA 139 135  K 3.6 3.8  CL 103 101  CO2 27 25  GLUCOSE 134* 149*  BUN 10 16  CREATININE 0.77 0.83  CALCIUM 8.7 8.6  MG -- --  PHOS -- --   No results found for this basename: AST:2,ALT:2,ALKPHOS:2,BILITOT:2,PROT:2,ALBUMIN:2 in the last 72 hours No results found for this basename: LIPASE:2,AMYLASE:2 in the last 72 hours  Basename 12/18/11 0011  WBC 3.5*  NEUTROABS 1.9  HGB 12.4*  HCT 37.2*  MCV 93.9  PLT 124*   No results found for this basename: CKTOTAL:3,CKMB:3,CKMBINDEX:3,TROPONINI:3 in the last 72 hours No components found with this basename: POCBNP:3 No results found for this basename: DDIMER:2 in the last 72 hours No results found for this basename: HGBA1C:2 in the last 72 hours No results found for this basename: CHOL:2,HDL:2,LDLCALC:2,TRIG:2,CHOLHDL:2,LDLDIRECT:2 in the last 72 hours No results found for this basename:  TSH,T4TOTAL,FREET3,T3FREE,THYROIDAB in the last 72 hours No results found for this basename: VITAMINB12:2,FOLATE:2,FERRITIN:2,TIBC:2,IRON:2,RETICCTPCT:2 in the last 72 hours  Micro Results: No results found for this or any previous visit (from the past 240 hour(s)).  Studies/Results: Ct Abdomen Pelvis W Contrast  12/16/2011  *RADIOLOGY REPORT*  Clinical Data: Low abdominal pain with nausea.  History of Parkinson's disease.  CT ABDOMEN AND PELVIS WITH CONTRAST  Technique:  Multidetector CT imaging of the abdomen and pelvis was performed following the standard protocol during bolus administration of intravenous contrast.  Contrast: OMNIPAQUE IOHEXOL 300 MG/ML  SOLN  Comparison: Limited correlation is made with a lumbar MRI 11/06/2007.  Findings: There is atelectasis or scarring in both lung bases. There is a small granuloma in the right lower lobe.  No significant pleural effusion is present.  The stomach, proximal and mid small bowel are diffusely distended with air fluid levels.  There is an apparent focal transition point in the left infraumbilical area anteriorly.  The point of transition is best seen on coronal image 29.  The distal small bowel and colon are decompressed.  There is fecalization of the small bowel proximal to this apparent level of obstruction.  No adjacent soft tissue mass is identified.  There is no significant bowel wall thickening or pneumatosis.  There is  mild mesenteric edema without focal extraluminal fluid or air collection.  The liver, gallbladder and biliary system appear normal.  There is a 1.5 cm low density lesion in the pancreatic head on image 36. The pancreas appears mildly atrophied without other focal abnormality or ductal dilatation.  The spleen, adrenal glands and kidneys appear normal.  There are no enlarged abdominal pelvic lymph nodes.  The bladder, prostate gland and seminal vesicles appear unremarkable for age. There is a left supraumbilical hernia containing  only fat (removed from the area of bowel transition).  There are also small inguinal hernias containing only fat.  There extensive postsurgical changes in the spine status post multilevel fusion.  There is a lower thoracic spinal stimulator. There is severe asymmetric right hip arthropathy.  IMPRESSION:  1.  Partial small-bowel obstruction.  The transition point appears to be within the mid ileum in the left infraumbilical region as described above.  Fecalization of the small bowel proximal to the level of obstruction suggests some chronicity. 2.  No evidence of bowel perforation or abscess. 3.  A small low-density lesion within the pancreatic head is likely incidental. Given the spinal stimulator and presumed inability to undergo MRI, I would suggest CT followup in approximately 6 months to assess stability.  Original Report Authenticated By: Gerrianne Scale, M.D.   Medications: Scheduled Meds:    . carbidopa-levodopa  1 tablet Oral QHS  . carbidopa-levodopa  3 tablet Oral TID WC  . DULoxetine  60 mg Oral Daily  . heparin  5,000 Units Subcutaneous Q8H  . rivastigmine  9.5 mg Transdermal Daily   Continuous Infusions:    . dextrose 5 % and 0.45% NaCl 1,000 mL with potassium chloride 20 mEq infusion 75 mL/hr at 12/19/11 2120   PRN Meds:.acetaminophen, acetaminophen, albuterol, menthol-cetylpyridinium, ondansetron (ZOFRAN) IV, ondansetron, phenol  ASSESSMENT & PLAN: Principal Problem:  *SBO (small bowel obstruction) Active Problems:  Acute kidney injury  Thrombocytopenia   Partial small bowel obstruction -CT scan of abdomen pelvis showed partial SBO. -There is fecalization of the small bowel, proximal to the transition point indicates chronicity. -Conservative management for now with NG tube for suction. -General surgeon is following.  -Abdominal x-ray today continues to show improved gas pattern, and resolving SBO.  Dehydration -Secondary to poor oral intake from the small bowel  obstruction. -Hydrate with IV fluids, this is resolved.  Parkinson disease -Continue current medications. -Clamp the NG tube after giving the medications.  Mild thrombocytopenia -Followup with CBC.  Confusion -Has acute delirium (sundowning) on top of chronic dementia. -It's not uncommon for the same symptoms to recur again at night.   LOS: 4 days   Eyan Hagood A 12/20/2011, 10:11 AM

## 2011-12-20 NOTE — Progress Notes (Signed)
Tolerating ng out so far - we shall see if he can progress - will try to advance slowly

## 2011-12-20 NOTE — Progress Notes (Signed)
Patient ID: Paul Callahan, male   DOB: 03/18/1924, 78 y.o.   MRN: [id number] SBO (small bowel obstruction)  Subjective: Easily awakened, cooperative, denies any NV, or other complaints today. Son at bedside.  Objective: Vital signs in last 24 hours: Temp:  [99 F (37.2 C)-100.5 F (38.1 C)] 99 F (37.2 C) (06/17 0549) Pulse Rate:  [62-76] 76  (06/17 0549) Resp:  [20-25] 20  (06/17 0549) BP: (147-165)/(67-70) 165/70 mmHg (06/17 0549) SpO2:  [92 %-95 %] 95 % (06/17 0549) Last BM Date: 12/18/11  Intake/Output from previous day: 06/16 0701 - 06/17 0700 In: 2033 [I.V.:2033] Out: 1650 [Urine:1450; Emesis/NG output:200] Intake/Output this shift:    General appearance: fatigued, no distress,  Resp: clear to auscultation bilaterally GI: Soft, seems a little distended, difussleytender,? of palpable bowel loops. + BS this am.  Lab Results:  No results found for this or any previous visit (from the past 24 hour(s)).   Studies/Results Radiology     MEDS, Scheduled    . carbidopa-levodopa  1 tablet Oral QHS  . carbidopa-levodopa  3 tablet Oral TID WC  . DULoxetine  60 mg Oral Daily  . heparin  5,000 Units Subcutaneous Q8H  . rivastigmine  9.5 mg Transdermal Daily     Assessment: SBO (small bowel obstruction)   Plan: 1. Recheck 2 way Abd. 2. Continue with NG, NPO staus 3. Await Dr. Tenna Child eval and recommendation.  LOS: 3 days    Currie Paris, MD, [facility], [place] [phone]   12/19/2011 8:16 AM

## 2011-12-21 DIAGNOSIS — R5381 Other malaise: Secondary | ICD-10-CM

## 2011-12-21 DIAGNOSIS — D696 Thrombocytopenia, unspecified: Secondary | ICD-10-CM

## 2011-12-21 DIAGNOSIS — K56609 Unspecified intestinal obstruction, unspecified as to partial versus complete obstruction: Secondary | ICD-10-CM

## 2011-12-21 DIAGNOSIS — N179 Acute kidney failure, unspecified: Secondary | ICD-10-CM

## 2011-12-21 DIAGNOSIS — G20 Parkinson's disease: Secondary | ICD-10-CM

## 2011-12-21 LAB — CBC
HCT: 39.1 % (ref 39.0–52.0)
Hemoglobin: 13.3 g/dL (ref 13.0–17.0)
MCH: 31.3 pg (ref 26.0–34.0)
MCHC: 34 g/dL (ref 30.0–36.0)
MCV: 92 fL (ref 78.0–100.0)
RBC: 4.25 MIL/uL (ref 4.22–5.81)

## 2011-12-21 LAB — BASIC METABOLIC PANEL
BUN: 9 mg/dL (ref 6–23)
CO2: 28 mEq/L (ref 19–32)
Chloride: 103 mEq/L (ref 96–112)
Glucose, Bld: 129 mg/dL — ABNORMAL HIGH (ref 70–99)
Potassium: 3.8 mEq/L (ref 3.5–5.1)

## 2011-12-21 NOTE — Progress Notes (Signed)
Patient ID: DOW BLAHNIK, male   DOB: 03-25-1924, 78 y.o.   MRN: [id number] Patient ID: HERSEL MCMEEN, male   DOB: 1923/09/17, 78 y.o.   MRN: [id number] SBO (small bowel obstruction)  Subjective: Somulent this am, no reports of any NV, or other complaints today. Son at bedside who was questioning patient's disposition post discharge; would like patient to go to "in-patient rehab".   Objective: Vital signs in last 24 hours: Temp:  [99 F (37.2 C)-100.5 F (38.1 C)] 99 F (37.2 C) (06/17 0549) Pulse Rate:  [62-76] 76  (06/17 0549) Resp:  [20-25] 20  (06/17 0549) BP: (147-165)/(67-70) 165/70 mmHg (06/17 0549) SpO2:  [92 %-95 %] 95 % (06/17 0549) Last BM Date: 12/18/11  Intake/Output from previous day: 06/16 0701 - 06/17 0700 In: 2033 [I.V.:2033] Out: 1650 [Urine:1450; Emesis/NG output:200] Intake/Output this shift:    General appearance:somulent, no distress, on room air. Resp: clear to auscultation bilaterally GI: Soft, seems a little distended, + BS this am. Unable to ascertain degree of tenderness as patient did not react to palpation of abdomen.  Lab Results:  No results found for this or any previous visit (from the past 24 hour(s)).   Studies/Results Radiology     MEDS, Scheduled    . carbidopa-levodopa  1 tablet Oral QHS  . carbidopa-levodopa  3 tablet Oral TID WC  . DULoxetine  60 mg Oral Daily  . heparin  5,000 Units Subcutaneous Q8H  . rivastigmine  9.5 mg Transdermal Daily     Assessment: SBO (small bowel obstruction)  Plan: 1. Continue with ice chips for now, may progress to clears after discussion with Dr. Jamey Ripa. 2. Also will discuss disposition plans with Dr. Jamey Ripa as well 3. Continue IVF for now  LOS: 3 days    Currie Paris, MD, [facility], [place] [phone]   12/19/2011 8:16 AM

## 2011-12-21 NOTE — Progress Notes (Signed)
Occupational Therapy Treatment Patient Details Name: Paul Callahan MRN: [id number] DOB: Mar 14, 1924 Today's Date: 12/21/2011 Time: 1700-1735 OT Time Calculation (min): 35 min  OT Assessment / Plan / Recommendation Comments on Treatment Session Pt making good progress. Anticipating D/C to CIR. Pt extremely high fall risk. Pt requires ModA while ambulating to control gait speed and rhythm. Improvement noted with decreased distraction.requires  manual facilitation for upright posture. Pt has forward posture premorbidly, but unable to control at this time due to general weakness. Pt will benefit from  CIR.`    Follow Up Recommendations  Inpatient Rehab    Barriers to Discharge       Equipment Recommendations  Defer to next venue    Recommendations for Other Services Rehab consult  Frequency Min 2X/week   Plan Discharge plan remains appropriate    Precautions / Restrictions Precautions Precautions: Fall   Pertinent Vitals/Pain No c/o pain    ADL  Eating/Feeding: Performed;Set up;Supervision/safety (Beginning to eat thin liquids) Where Assessed - Eating/Feeding: Edge of bed    OT Diagnosis:    OT Problem List:   OT Treatment Interventions:     OT Goals Acute Rehab OT Goals OT Goal Formulation: With patient Time For Goal Achievement: 12/26/11 Potential to Achieve Goals: Good ADL Goals Pt Will Perform Grooming: with min assist;Sitting, edge of bed;Unsupported ADL Goal: Grooming - Progress: Met Pt Will Perform Upper Body Bathing: with mod assist;Other (comment) ADL Goal: Upper Body Bathing - Progress: Progressing toward goals Pt Will Perform Lower Body Bathing: with mod assist;Sit to stand from chair ADL Goal: Lower Body Bathing - Progress: Progressing toward goals Pt Will Perform Upper Body Dressing: with mod assist;Unsupported;Sitting, chair ADL Goal: Upper Body Dressing - Progress: Progressing toward goals Pt Will Transfer to Toilet: with min assist;3-in-1;with cueing  (comment type and amount) ADL Goal: Toilet Transfer - Progress: Progressing toward goals Pt Will Perform Toileting - Clothing Manipulation: with min assist;Standing ADL Goal: Toileting - Clothing Manipulation - Progress: Progressing toward goals Pt Will Perform Toileting - Hygiene: with min assist;Standing at 3-in-1/toilet ADL Goal: Toileting - Hygiene - Progress: Progressing toward goals  Visit Information  Last OT Received On: 12/21/11 Assistance Needed: +1    Subjective Data      Prior Functioning       Cognition  Overall Cognitive Status: Impaired Area of Impairment: Attention;Memory;Safety/judgement;Problem solving Arousal/Alertness: Awake/alert Orientation Level: Appears intact for tasks assessed Behavior During Session: [name] for tasks performed Current Attention Level: Sustained Memory: Decreased recall of precautions Safety/Judgement: Decreased awareness of safety precautions;Decreased safety judgement for tasks assessed Cognition - Other Comments: MOre alert today. Mod A with functional basic problem solving.    Mobility Bed Mobility Bed Mobility: Rolling Right;Right Sidelying to Sit;Sitting - Scoot to [name] Rolling Right: 3: Mod assist Rolling Right: Patient Percentage: 70% Right Sidelying to Sit: 3: Mod assist Right Sidelying to Sit: Patient Percentage: 60% Sitting - Scoot to Edge of Bed: 3: Mod assist Details for Bed Mobility Assistance: facilitation for initiation of rotational movement, slower reaction time and processing of instructions; more faciliation needing to bring trunk upright, facilitation at hips for reciprocal scooting and use of pad to assist Transfers Transfers: Sit to Stand;Stand to Sit Sit to Stand: 3: Mod assist;From elevated surface;With upper extremity assist;From bed Sit to Stand: Patient Percentage: 70% Stand to Sit: 3: Mod assist;With upper extremity assist;To chair/3-in-1 Details for Transfer Assistance: manual facilitation for hand  placement and controlled descent.   Exercises    Balance Dynamic  Sitting Balance Dynamic Sitting - Balance Support: No upper extremity supported Dynamic Sitting - Level of Assistance: 4: Min assist Dynamic Sitting - Balance Activities: Reaching across midline;Trunk control activities Dynamic Sitting - Comments: facilitation for improved upright posture; encourage head/neck midline positioning; with assist pt initiated trunk rotation activities  Static Standing Balance Static Standing - Balance Support: No upper extremity supported Static Standing - Level of Assistance: 4: Min assist (poor tolerance, shaking, fatigued) Static Standing - Comment/# of Minutes: stood x1 minute but with poor tolerance, shaking Dynamic Standing Balance Dynamic Standing - Balance Support: Bilateral upper extremity supported Dynamic Standing - Level of Assistance: 4: Min assist Dynamic Standing - Comments: trunk extension activities with minA  End of Session OT - End of Session Equipment Utilized During Treatment: Gait belt Activity Tolerance: Patient tolerated treatment well Patient left: in chair;with call bell/phone within reach;with family/visitor present Nurse Communication: Mobility status   [name],HILLARY 12/21/2011, 5:45 PM [facility], OTR/L  [phone] 12/21/2011

## 2011-12-21 NOTE — Progress Notes (Signed)
More awake when I saw him. No nausea, had another BM after I saw him yesterday. ASbd seems benign. Will start some liquids and see how he does.

## 2011-12-21 NOTE — Progress Notes (Signed)
OT EVALUATION  Past Medical History  Diagnosis Date  . Parkinson disease   . Dementia   . Depression   . Bowel obstruction 12/15/2011  . Neuromuscular disorder     hx of parkinsons  . Cancer     hx of colon cancer  . Arthritis    Past Surgical History  Procedure Date  . Eye surgery   . Cataracts   . Hernia repair   . Back surgery     x 5    12/19/11 1315  OT Visit Information  Last OT Received On 12/19/11  Assistance Needed +2  PT/OT Co-Evaluation/Treatment Yes  OT Time Calculation  OT Start Time 1145  OT Stop Time 1220  OT Time Calculation (min) 35 min  Precautions  Precautions Fall  Required Braces or Orthoses Other Brace/Splint (wears R AFO)  Home Living  Lives With Spouse  Available Help at Discharge Family;Personal care attendant (personal care attendant 2x/wk to assist with dressing/bathin)  Type of Home House  Home Access Stairs to enter  Entrance Stairs-Number of Steps 1  Home Layout Two level;Able to live on main level with bedroom/bathroom  Catering manager (3in1 over toilet)  Home Adaptive Equipment Bedside commode/3-in-1;Walker - four wheeled;Grab bars in shower;Tub transfer bench  Prior Function  Level of Independence Needs assistance  Needs Assistance Dressing;Meal Prep;Light Housekeeping  Dressing Moderate  Meal Prep Total  Light Housekeeping Total  Able to Take Stairs? Yes  Driving No  Vocation Retired  Comments Pt recently D/Ced fromoutpt PT neuro clinic.with excellent results.  Communication  Communication Expressive difficulties  ADL  Eating/Feeding Simulated;Minimal assistance  Grooming Simulated;Maximal assistance  Where Assessed - Grooming Supported sitting  Upper Body Bathing Simulated;+1 Total assistance  Where Assessed - Upper Body Bathing Supported sitting  Lower Body Bathing Simulated;+1 Total assistance  Where Assessed - Lower Body Bathing Supported sit to stand  Upper Body Dressing  Simulated;+1 Total assistance  Where Assessed - Upper Body Dressing Supported sitting  Lower Body Dressing Simulated;+1 Total assistance  Where Assessed - Lower Body Dressing Sopported sit to Runner, broadcasting/film/video Total assistance  Toilet Transfer: Patient Percentage 80%  Toilet Transfer Method Sit to stand;Stand pivot  Acupuncturist Other (comment) (bed - chair)  Equipment Used Gait belt;Rolling walker  Transfers/Ambulation Related to ADLs Able to ambulate with +1 with mod A. Pt limited by weakness and parkinson's symptoms  ADL Comments total A at this time. Pt most likely not receiving full parkinson's meds due to NG tube, which exacerbate his symptoms  Cognition  Overall Cognitive Status Appears within functional limits for tasks assessed/performed  Arousal/Alertness Awake/alert  Behavior During Session [facility] for tasks performed  Cognition - Other Comments slower processing, has been lethargic some recently, seems aware of the situation and became very emotional during our eval today  Right Upper Extremity Assessment  RUE ROM/Strength/Tone (see OT note)  Left Upper Extremity Assessment  LUE ROM/Strength/Tone (see OT note)  Right Lower Extremity Assessment  RLE ROM/Strength/Tone Deficits  RLE ROM/Strength/Tone Deficits in sitting pt unable to get full extension in his knee with a pretty firm end feel, grossly 4-/5 knee extension and flexion  RLE Sensation (not tested, no c/o numbness or tingling)  RLE Coordination Deficits  RLE Coordination Deficits bradykinetic movement, shuffled gait  Left Lower Extremity Assessment  LLE ROM/Strength/Tone Deficits  LLE ROM/Strength/Tone Deficits in sitting pt unable to get full extension in his knee with a pretty firm end feel,  grossly 4-/5 knee extension and flexion  LLE Sensation (not tested, no c/o numbness or tingling)  LLE Coordination Deficits  LLE Coordination Deficits bradykinetic movement, shuffled gait  Trunk  Assessment  Trunk Assessment Kyphotic  Bed Mobility  Bed Mobility Rolling Right;Right Sidelying to Sit;Sitting - Scoot to Edge of Bed  Rolling Right 1: +2 Total assist  Rolling Right: Patient Percentage 50%  Right Sidelying to Sit 1: +2 Total assist  Right Sidelying to Sit: Patient Percentage 50%  Sitting - Scoot to Edge of Bed 3: Mod assist  Details for Bed Mobility Assistance increased time for patient to process, verbal and facilitatory cues to sequence  Transfers  Sit to Stand 1: +2 Total assist  Sit to Stand: Patient Percentage 80%  Stand to Sit 4: Min assist  Details for Transfer Assistance sequencing cues and facilitation for stability initially upon standing as well as improved posture; sequencing cues and facilitation to initiate sitting, hand over hand to guide hands to arm rests  OT - End of Session  Equipment Utilized During Treatment Gait belt  Activity Tolerance Patient limited by fatigue  Patient left in chair;with call bell/phone within reach;with family/visitor present  Nurse Communication Mobility status  OT Assessment  Clinical Impression Statement 78 yo with Parkinson's with recent SBO. Pt will benfit from skilledOt services tomax independence with Adl and functional mobility for Adlto facilitate D/C tonext venue ocare. Pt will greatly benfit fro CIR toreturn toPLOF and D/C home with wife.  OT Recommendation/Assessment Patient needs continued OT Services  OT Problem List Decreased strength;Decreased range of motion;Decreased activity tolerance;Impaired balance (sitting and/or standing);Decreased coordination;Decreased safety awareness;Decreased knowledge of use of DME or AE;Pain  Barriers to Discharge None  OT Therapy Diagnosis  Generalized weakness;Acute pain  OT Plan  OT Frequency Min 2X/week  OT Treatment/Interventions Self-care/ADL training;Therapeutic exercise;Energy conservation;DME and/or AE instruction;Therapeutic activities;Patient/family education;Cognitive  remediation/compensation;Balance training  OT Recommendation  Recommendations for Other Services Rehab consult  Follow Up Recommendations Inpatient Rehab  Equipment Recommended Defer to next venue  Individuals Consulted  Consulted and Agree with Results and Recommendations Patient;Family member/caregiver  Family Member Consulted wife  Acute Rehab OT Goals  OT Goal Formulation With patient  Time For Goal Achievement 12/26/11  Potential to Achieve Goals Good  ADL Goals  Pt Will Perform Grooming with min assist;Sitting, edge of bed;Unsupported  Pt Will Perform Upper Body Bathing with mod assist;Other (comment) (in most condusive position)  Pt Will Perform Lower Body Bathing with mod assist;Sit to stand from chair  Pt Will Perform Upper Body Dressing with mod assist;Unsupported;Sitting, chair  Pt Will Transfer to Toilet with min assist;3-in-1;with cueing (comment type and amount)  Pt Will Perform Toileting - Clothing Manipulation with min assist;Standing  Pt Will Perform Toileting - Hygiene with min assist;Standing at 3-in-1/toilet  ADL Goal: Grooming - Progress Goal set today  ADL Goal: Upper Body Bathing - Progress Goal set today  ADL Goal: Lower Body Bathing - Progress Goal set today  ADL Goal: Upper Body Dressing - Progress Goal set today  ADL Goal: Toilet Transfer - Progress Goal set today  ADL Goal: Toileting - Clothing Manipulation - Progress Goal set today   ADL Goal: Toileting - Hygiene - Progress Goal set today  OT General Charges  $OT Visit 1 Procedure  OT Evaluation  $Initial OT Evaluation Tier II 1 Procedure  OT Treatments  $Therapeutic Activity 8-22 mins  Written Expression  Dominant Hand Right    Luisa Dago, OTR/L  [phone] 12/21/2011

## 2011-12-21 NOTE — Progress Notes (Signed)
Physical Therapy Treatment Patient Details Name: Paul Callahan MRN: [id number] DOB: [birth year] Today's Date: 12/21/2011 Time: 7829-5621 PT Time Calculation (min): 26 min  PT Assessment / Plan / Recommendation Comments on Treatment Session  Paul Callahan is 78 y/o male with history of PD who was admitted for SBO. Good progress with therapies but still not at a level that wife can handle him at home. Continues to need further therapies and will benefit continued therapies at CIR prior to d/c home with wife.     Follow Up Recommendations  Inpatient Rehab    Barriers to Discharge        Equipment Recommendations  Defer to next venue    Recommendations for Other Services    Frequency Min 3X/week   Plan Discharge plan remains appropriate;Frequency remains appropriate    Precautions / Restrictions Precautions Precautions: Fall       Mobility  Bed Mobility Rolling Right: 1: +2 Total assist Rolling Right: Patient Percentage: 70% Right Sidelying to Sit: 1: +2 Total assist Right Sidelying to Sit: Patient Percentage: 60% Sitting - Scoot to Edge of Bed: 3: Mod assist Details for Bed Mobility Assistance: facilitation for initiation of rotational movement, slower reaction time and processing of instructions; more faciliation needing to bring trunk upright, facilitation at hips for reciprocal scooting and use of pad to assist Transfers Sit to Stand: 3: Mod assist;With upper extremity assist;From elevated surface;From bed Stand to Sit: With upper extremity assist;4: Min assist;To chair/3-in-1;With armrests Details for Transfer Assistance: cues for safe hand placement, good initial attempt to stand but needing facilitation to bring trunk anteriorly over BOS as well as assist with full extension; facilitation at shoulders/trunk for improved extension but pt very tight Ambulation/Gait Ambulation/Gait Assistance: 4: Min assist Ambulation Distance (Feet): 175 Feet Assistive device: Rolling  walker Ambulation/Gait Assistance Details: slow, flexed, stumbling gait, very easily fatigued; facilitation for upright posture throughout and forward gaze (pt wearing his AFO/corrective shoe today improved stability), still with slight preference for left lean (holds head in right sidelying rotation in balance from left lateral lean)  Gait Pattern: Trunk flexed;Shuffle;Decreased stride length;Lateral trunk lean to left    Exercises      PT Goals Acute Rehab PT Goals PT Goal: Rolling Supine to Right Side - Progress: Progressing toward goal PT Goal: Supine/Side to Sit - Progress: Progressing toward goal PT Goal: Sit to Stand - Progress: Progressing toward goal PT Goal: Stand to Sit - Progress: Progressing toward goal PT Transfer Goal: Bed to Chair/Chair to Bed - Progress: Progressing toward goal PT Goal: Stand - Progress: Progressing toward goal PT Goal: Ambulate - Progress: Progressing toward goal  Visit Information  Last PT Received On: 12/21/11 Assistance Needed: +2 (ambulates +1)    Subjective Data  Subjective: Im ready to move.    Cognition  Overall Cognitive Status: Impaired Arousal/Alertness: Awake/alert Orientation Level: Appears intact for tasks assessed Behavior During Session: [facility] for tasks performed Cognition - Other Comments: slower processing and reaction time (parkinsons), masked face but appropriate humor     Balance  Dynamic Sitting Balance Dynamic Sitting - Balance Support: No upper extremity supported Dynamic Sitting - Level of Assistance: 4: Min assist Dynamic Sitting - Balance Activities: Reaching across midline;Trunk control activities Dynamic Sitting - Comments: facilitation for improved upright posture; encourage head/neck midline positioning; with assist pt initiated trunk rotation activities  Static Standing Balance Static Standing - Balance Support: No upper extremity supported Static Standing - Level of Assistance: 4: Min assist (poor tolerance,  shaking,  fatigued) Static Standing - Comment/# of Minutes: stood x1 minute but with poor tolerance, shaking Dynamic Standing Balance Dynamic Standing - Balance Support: Bilateral upper extremity supported Dynamic Standing - Level of Assistance: 4: Min assist Dynamic Standing - Comments: trunk extension activities with minA  End of Session PT - End of Session Equipment Utilized During Treatment: Gait belt Activity Tolerance: Patient limited by fatigue;Patient tolerated treatment well Patient left: in chair;with call bell/phone within reach Nurse Communication: Mobility status    Paul Callahan 12/21/2011, 2:30 PM

## 2011-12-21 NOTE — Progress Notes (Signed)
Patient ID: Paul Callahan, male   DOB: 05-19-1924, 78 y.o.   MRN: [id number]  [facility] PROGRESS NOTE  DEADRICK STIDD WUJ:[id number] DOB: 1923-11-05 DOA: 12/16/2011 PCP: [name], MD  HPI/Subjective: Pt reports feeling better, only able to tolerate ice chips.  Objective: Filed Vitals:   12/20/11 1448 12/20/11 2200 12/21/11 0625 12/21/11 1357  BP: 144/68 162/76 150/90 148/68  Pulse: 87 73 72 70  Temp: 98.8 F (37.1 C) 98.1 F (36.7 C) 99 F (37.2 C) 99.7 F (37.6 C)  TempSrc: Oral Oral  Oral  Resp: 18 18 17 18   Height:      Weight:      SpO2: 92% 96% 96% 95%    Intake/Output Summary (Last 24 hours) at 12/21/11 1531 Last data filed at 12/21/11 1045  Gross per 24 hour  Intake      0 ml  Output   2375 ml  Net  -2375 ml    Exam:   General:  Pt is somnolent but follows commands appropriately, not in acute distress  Cardiovascular: Regular rate and rhythm, S1/S2, no murmurs, no rubs, no gallops  Respiratory: Clear to auscultation bilaterally, no wheezing, no crackles, no rhonchi  Abdomen: Soft, non tender, non distended, bowel sounds present, no guarding  Extremities: No edema, pulses DP and PT palpable bilaterally  Neuro: Grossly nonfocal  Data Reviewed: Basic Metabolic Panel:  Lab 12/21/11 9562 12/19/11 0735 12/18/11 0011 12/17/11 0625 12/16/11 1455 12/16/11 0620  NA 140 139 135 138 -- 141  K 3.8 3.6 3.8 4.2 -- 4.8  CL 103 103 101 103 -- 103  CO2 28 27 25 27  -- 27  GLUCOSE 129* 134* 149* 140* -- 140*  BUN 9 10 16  28* -- 29*  CREATININE 1.00 0.77 0.83 1.01 1.02 --  CALCIUM 8.9 8.7 8.6 9.0 -- 10.3  MG -- -- -- -- 2.2 --  PHOS -- -- -- -- -- --   Liver Function Tests:  Lab 12/17/11 0625 12/16/11 0620  AST 28 29  ALT 18 6  ALKPHOS 72 86  BILITOT 1.4* 0.7  PROT 6.1 7.6  ALBUMIN 3.1* 4.0    Lab 12/16/11 0620  LIPASE 21  AMYLASE --   No results found for this basename: AMMONIA:5 in the last 168 hours CBC:  Lab 12/21/11 0700 12/18/11  0011 12/17/11 0625 12/16/11 1455 12/16/11 0620  WBC 6.1 3.5* 3.2* 7.0 7.8  NEUTROABS -- 1.9 -- -- 7.4  HGB 13.3 12.4* 12.7* 14.8 14.6  HCT 39.1 37.2* 38.7* 44.4 44.4  MCV 92.0 93.9 93.9 94.5 94.7  PLT 169 124* 131* 151 130*    Studies: Dg Abd 2 Views  12/20/2011  *RADIOLOGY REPORT*  Clinical Data: 77 year old male with small bowel obstruction. Abdomen pain.  ABDOMEN - 2 VIEW  Comparison: 12/19/2011 and earlier.  Findings: Enteric tube remains in place and is stable.  No pneumoperitoneum.  Continued decreased gaseous dilation of small bowel loops in mid abdomen.  Interval increased large bowel gas. There remains a disproportionate amount of small bowel gas relative to colon gas.  Stable postoperative changes throughout the spine and pelvis. Advanced degenerative changes of the right hip again noted.  IMPRESSION: Gas pattern compatible with partial small bowel obstruction, continuing to slowly improve.  No free air.  Original Report Authenticated By: Harley Hallmark, M.D.    Scheduled Meds:   . carbidopa-levodopa  1 tablet Oral QHS  . carbidopa-levodopa  3 tablet Oral TID WC  . DULoxetine  60 mg  Oral Daily  . heparin  5,000 Units Subcutaneous Q8H  . rivastigmine  9.5 mg Transdermal Daily   Continuous Infusions:   . dextrose 5 % and 0.45% NaCl 1,000 mL with potassium chloride 20 mEq infusion 75 mL/hr at 12/20/11 1423     Assessment/Plan:  Partial small bowel obstruction  -CT scan of abdomen pelvis showed partial SBO.  -There is fecalization of the small bowel, proximal to the transition point indicates chronicity.  -Conservative management for now with NG tube for suction.  -General surgeon is following.  -Abdominal x-ray today continues to show improved gas pattern, and resolving SBO.  - advance diet to clear liquids  Dehydration  -Secondary to poor oral intake from the small bowel obstruction.  -Hydrate with IV fluids, this is resolved.   Parkinson disease  -Continue current  medications.  -Clamp the NG tube after giving the medications.   Mild thrombocytopenia  -Followup with CBC.   Confusion  -Has acute delirium (sundowning) on top of chronic dementia.  -It's not uncommon for the same symptoms to recur again at night.  Code Status: Full Family Communication: Pt and family at bedside Disposition Plan: IR when medically ready  Debbora Presto, MD  [facility] Pager [phone]  If 7PM-7AM, please contact night-coverage [email or web address] Password [email or web address] 12/21/2011, 3:31 PM   LOS: 5 days

## 2011-12-21 NOTE — Consult Note (Signed)
Physical Medicine and Rehabilitation Consult Reason for Consult: Deconditioning/SBO/Parkinson disease Referring Physician: [facility]   HPI: Paul Callahan is a 78 y.o. right-handed male with history of Parkinson's disease as well as colon cancer with resection. Admitted 12/16/2011 with abdominal pain with associated nausea vomiting. X-rays and imaging revealed partial small bowel obstruction likely due to adhesions from previous partial colectomy. A nasogastric tube was placed. General surgery was consulted with conservative management. Patient maintained on IV fluids for necessary hydration. His diet was to be slowly advanced after nasogastric tube removed. He was on subcutaneous heparin for DVT prophylaxis. He continues on his current regimen for Parkinson's disease. Patient noted to have bouts of acute delirium sundowning with noted history of dementia. Physical therapy evaluation completed  recommendations for physical medicine rehabilitation consult to consider inpatient rehabilitation  services According to the patient's wife, the patient had been at [facility] following back surgery in 2009 under Dr. Riley Kill Patient required some assistance with dressing and bathing at home but otherwise was able to toilet independently using an assisted  Has had a chronic right foot drop following back surgery in 2009 and has been using AFO   Review of Systems  Gastrointestinal: Positive for nausea and vomiting.  Psychiatric/Behavioral:       Dementia   Past Medical History  Diagnosis Date  . Parkinson disease   . Dementia   . Depression   . Bowel obstruction 12/15/2011  . Neuromuscular disorder     hx of parkinsons  . Cancer     hx of colon cancer  . Arthritis    Past Surgical History  Procedure Date  . Eye surgery   . Cataracts   . Hernia repair   . Back surgery     x 5   History reviewed. No pertinent family history. Social History:  reports that he has quit smoking. He has never used smokeless  tobacco. He reports that he does not drink alcohol or use illicit drugs. Allergies:  Allergies  Allergen Reactions  . Amantadines Other (See Comments)    Weakness in legs and hallucinations  . Fentanyl Other (See Comments)    Confusion and disorientation   . Gabapentin Other (See Comments)    Weakness in legs and hallucinations  . Oxycodone     Confusion and disorientation   . Phenergan (Promethazine Hcl)     Confusion and disorientation    Medications Prior to Admission  Medication Sig Dispense Refill  . acetaminophen (TYLENOL) 500 MG tablet Take 1,000 mg by mouth 2 (two) times daily.      . calcium carbonate (OS-CAL) 600 MG TABS Take 600 mg by mouth 2 (two) times daily with a meal.      . carbidopa-levodopa (SINEMET CR) 25-100 MG per tablet Take 1 tablet by mouth at bedtime.      . carbidopa-levodopa (SINEMET IR) 25-100 MG per tablet Take 3 tablets by mouth 3 (three) times daily.      . celecoxib (CELEBREX) 200 MG capsule Take 200 mg by mouth 2 (two) times daily.      . cholecalciferol (VITAMIN D) 1000 UNITS tablet Take 2,000 Units by mouth daily.      . DULoxetine (CYMBALTA) 60 MG capsule Take 60 mg by mouth daily.      . Multiple Vitamin (MULTIVITAMIN WITH MINERALS) TABS Take 1 tablet by mouth daily.      . pregabalin (LYRICA) 150 MG capsule Take 150 mg by mouth 2 (two) times daily.      Marland Kitchen  rivastigmine (EXELON) 9.5 mg/24hr Place 1 patch onto the skin daily.      Marland Kitchen zinc gluconate 50 MG tablet Take 50 mg by mouth daily.        Home: Home Living Lives With: Spouse Available Help at Discharge: Family;Personal care attendant (personal care attendant 2x/wk to assist with dressing/bathin) Type of Home: House Home Access: Stairs to enter [employer] of Steps: 1 Home Layout: Two level;Able to live on main level with bedroom/bathroom Bathroom Shower/Tub: Health visitor:  (3in1 over toilet) Home Adaptive Equipment: Bedside commode/3-in-1;Walker - four  wheeled;Grab bars in shower;Tub transfer bench  Functional History: Prior Function Dressing: Moderate Meal Prep: Total Light Housekeeping: Total Able to Take Stairs?: Yes Driving: No Vocation: Retired Comments: Pt recently D/Ced fromoutpt PT neuro clinic.with excellent results. Functional Status:  Mobility: Bed Mobility Bed Mobility: Rolling Right;Right Sidelying to Sit;Sitting - Scoot to [name] Rolling Right: 3: Mod assist Rolling Right: Patient Percentage: 50% Right Sidelying to Sit: 1: +2 Total assist;HOB elevated (30 degrees) Right Sidelying to Sit: Patient Percentage: 50% Sitting - Scoot to Edge of Bed: 3: Mod assist Transfers Transfers: Sit to Stand;Stand to Sit Sit to Stand: 1: +2 Total assist;From elevated surface;With upper extremity assist;From bed Sit to Stand: Patient Percentage: 70% Stand to Sit: 3: Mod assist;To chair/3-in-1;With upper extremity assist Ambulation/Gait Ambulation/Gait Assistance: 4: Min assist Ambulation Distance (Feet): 40 Feet Assistive device: Rolling walker Ambulation/Gait Assistance Details: did not wear his AFO and corrective shoe today so ambulated with RLE on his toe to even his weight out, slightly shifted to the left (likely because of the leg length discrepancy) Gait Pattern: Trunk flexed;Shuffle;Lateral trunk lean to left    ADL: ADL Eating/Feeding: Simulated;Minimal assistance Grooming: Simulated;Maximal assistance Where Assessed - Grooming: Supported sitting Upper Body Bathing: Simulated;+1 Total assistance Where Assessed - Upper Body Bathing: Supported sitting Lower Body Bathing: Simulated;+1 Total assistance Where Assessed - Lower Body Bathing: Supported sit to stand Upper Body Dressing: Simulated;+1 Total assistance Where Assessed - Upper Body Dressing: Supported sitting Lower Body Dressing: Simulated;+1 Total assistance Where Assessed - Lower Body Dressing: Sopported sit to stand Toilet Transfer: Simulated;+2 Total  assistance Toilet Transfer Method: Sit to stand;Stand pivot Acupuncturist: Other (comment) (bed - chair) Equipment Used: Gait belt;Rolling walker Transfers/Ambulation Related to ADLs: Able to ambulate with +1 with mod A. Pt limited by weakness and parkinson's symptoms ADL Comments: total A at this time. Pt most likely not receiving full parkinson's meds due to NG tube, which exacerbate his symptoms  Cognition: Cognition Arousal/Alertness: Lethargic (easily arousable) Orientation Level: Oriented to person Cognition Overall Cognitive Status: Impaired Arousal/Alertness: Lethargic (easily arousable) Orientation Level: Appears intact for tasks assessed Behavior During Session: Flat affect (emotional) Cognition - Other Comments: slower processing  Blood pressure 162/76, pulse 73, temperature 98.1 F (36.7 C), temperature source Oral, resp. rate 18, height 5\' 9"  (1.753 m), weight 83.462 kg (184 lb), SpO2 96.00%. Physical Exam  HENT:  Head: Normocephalic.  Neck: Neck supple. No thyromegaly present.  Cardiovascular: Regular rhythm.   Pulmonary/Chest: No respiratory distress.  Abdominal: He exhibits distension. There is tenderness. There is rebound.  Neurological: He is alert.       Patient is a poor historian. Noted delay in processing. He could not state his age or place needing multiple cues. He follows basic commands.  Psychiatric:       Patient limited awareness of deficits  motor strength is 4 minus/5 in bilateral deltoid, biceps, triceps, grip, 3 minus  bilateral hip flexors knee extensors 2 minus right ankle dorsiflexors 3 minus in the left ankle dorsiflexors Sensation is reduced in the right foot compared left foot. Mood and affect are within motional lability No results found for this or any previous visit (from the past 24 hour(s)). Dg Abd 2 Views  12/20/2011  *RADIOLOGY REPORT*  Clinical Data: 78 year old male with small bowel obstruction. Abdomen pain.  ABDOMEN - 2  VIEW  Comparison: 12/19/2011 and earlier.  Findings: Enteric tube remains in place and is stable.  No pneumoperitoneum.  Continued decreased gaseous dilation of small bowel loops in mid abdomen.  Interval increased large bowel gas. There remains a disproportionate amount of small bowel gas relative to colon gas.  Stable postoperative changes throughout the spine and pelvis. Advanced degenerative changes of the right hip again noted.  IMPRESSION: Gas pattern compatible with partial small bowel obstruction, continuing to slowly improve.  No free air.  Original Report Authenticated By: Harley Hallmark, M.D.   Dg Abd 2 Views  12/19/2011  *RADIOLOGY REPORT*  Clinical Data: 78 year old male with abdominal pain.  Small bowel obstruction.  ABDOMEN - 2 VIEW  Comparison: 12/18/2011 and earlier.  Findings: Lower thoracic spinal stimulator device and postoperative changes to the lumbar spine again noted.  Postoperative changes in the pelvis with distal colon anastomosis again evident.   Severe osseous degenerative changes of the right hip again noted.  No pneumoperitoneum on left side down lateral decubitus views.  NG tube in place, side hole the level of the gastric fundus.  The bowel gas pattern remains improved since 12/16/2011. There is more distal small bowel gas than at the time of the recent CT.  Mid small bowel loops remain dilated up to 50 mm diameter.  Right colon gas also appears increased.  IMPRESSION: Improved gas pattern since 12/16/2011, mildly improved since yesterday.  Appearance suggests a slowly resolving small bowel obstruction.  No free air.  Original Report Authenticated By: Harley Hallmark, M.D.    Assessment/Plan: Diagnosis: deconditioning after small bowel obstruction in a patient with chronic progressive Parkinson's disease and right foot drop 1. Does the need for close, 24 hr/day medical supervision in concert with the patient's rehab needs make it unreasonable for this patient to be served in a  less intensive setting? Yes 2. Co-Morbidities requiring supervision/potential complications: depression, chronic pain, acute renal insufficiency 3. Due to bladder management, bowel management, safety, skin/wound care, disease management, medication administration, pain management and patient education, does the patient require 24 hr/day rehab nursing? Yes 4. Does the patient require coordinated care of a physician, rehab nurse, PT (1-2 hrs/day, 5 days/week), OT (1-2 hrs/day, 5 days/week) and SLP (0.5-1 hrs/day, 5 days/week) to address physical and functional deficits in the context of the above medical diagnosis(es)? Yes Addressing deficits in the following areas: balance, endurance, locomotion, strength, transferring, bowel/bladder control, bathing, dressing, feeding, toileting and cognition 5. Can the patient actively participate in an intensive therapy program of at least 3 hrs of therapy per day at least 5 days per week? Yes 6. The potential for patient to make measurable gains while on inpatient rehab is good 7. Anticipated functional outcomes upon discharge from inpatient rehab are supervision to min assist mobility with PT, supervision to min assist ADLs with OT, orientation x3 with SLP. 8. Estimated rehab length of stay to reach the above functional goals is: 2 weeks 9. Does the patient have adequate social supports to accommodate these discharge functional goals? Yes 10. Anticipated D/C setting:  Home 11. Anticipated post D/C treatments: HH therapy 12. Overall Rehab/Functional Prognosis: good  RECOMMENDATIONS: This patient's condition is appropriate for continued rehabilitative care in the following setting: CIR Patient has agreed to participate in recommended program. Potentially Note that insurance prior authorization may be required for reimbursement for recommended care.  Comment:    12/21/2011

## 2011-12-21 NOTE — Progress Notes (Signed)
I met with patient, his wife, and family at bedside. Patient is appropriate for an admission to CIR once diet has been advanced and tolerated. Patient previously at [facility] in 2009. Family in agreement. I will follow up in the morning. Please call me with questions. [phone].

## 2011-12-22 ENCOUNTER — Inpatient Hospital Stay (HOSPITAL_COMMUNITY)
Admission: RE | Admit: 2011-12-22 | Discharge: 2012-01-03 | DRG: 945 | Disposition: A | Discharge status: Home / Self Care | Payer: Medicare Other | Source: Ambulatory Visit | Attending: Physical Medicine & Rehabilitation | Admitting: Physical Medicine & Rehabilitation

## 2011-12-22 DIAGNOSIS — K565 Intestinal adhesions [bands], unspecified as to partial versus complete obstruction: Secondary | ICD-10-CM | POA: Diagnosis not present

## 2011-12-22 DIAGNOSIS — Z85038 Personal history of other malignant neoplasm of large intestine: Secondary | ICD-10-CM | POA: Diagnosis not present

## 2011-12-22 DIAGNOSIS — F329 Major depressive disorder, single episode, unspecified: Secondary | ICD-10-CM

## 2011-12-22 DIAGNOSIS — Z87891 Personal history of nicotine dependence: Secondary | ICD-10-CM | POA: Diagnosis not present

## 2011-12-22 DIAGNOSIS — R32 Unspecified urinary incontinence: Secondary | ICD-10-CM

## 2011-12-22 DIAGNOSIS — M129 Arthropathy, unspecified: Secondary | ICD-10-CM | POA: Diagnosis not present

## 2011-12-22 DIAGNOSIS — N179 Acute kidney failure, unspecified: Secondary | ICD-10-CM

## 2011-12-22 DIAGNOSIS — R300 Dysuria: Secondary | ICD-10-CM

## 2011-12-22 DIAGNOSIS — E639 Nutritional deficiency, unspecified: Secondary | ICD-10-CM | POA: Diagnosis not present

## 2011-12-22 DIAGNOSIS — M216X9 Other acquired deformities of unspecified foot: Secondary | ICD-10-CM | POA: Diagnosis not present

## 2011-12-22 DIAGNOSIS — K5900 Constipation, unspecified: Secondary | ICD-10-CM | POA: Diagnosis not present

## 2011-12-22 DIAGNOSIS — G20A1 Parkinson's disease without dyskinesia, without mention of fluctuations: Secondary | ICD-10-CM

## 2011-12-22 DIAGNOSIS — T428X5A Adverse effect of antiparkinsonism drugs and other central muscle-tone depressants, initial encounter: Secondary | ICD-10-CM

## 2011-12-22 DIAGNOSIS — G20 Parkinson's disease: Secondary | ICD-10-CM

## 2011-12-22 DIAGNOSIS — K56609 Unspecified intestinal obstruction, unspecified as to partial versus complete obstruction: Secondary | ICD-10-CM | POA: Diagnosis not present

## 2011-12-22 DIAGNOSIS — I951 Orthostatic hypotension: Secondary | ICD-10-CM

## 2011-12-22 DIAGNOSIS — Z79899 Other long term (current) drug therapy: Secondary | ICD-10-CM | POA: Diagnosis not present

## 2011-12-22 DIAGNOSIS — F3289 Other specified depressive episodes: Secondary | ICD-10-CM

## 2011-12-22 DIAGNOSIS — R5381 Other malaise: Secondary | ICD-10-CM | POA: Diagnosis not present

## 2011-12-22 DIAGNOSIS — Z5189 Encounter for other specified aftercare: Principal | ICD-10-CM

## 2011-12-22 DIAGNOSIS — F0390 Unspecified dementia without behavioral disturbance: Secondary | ICD-10-CM

## 2011-12-22 DIAGNOSIS — T441X5A Adverse effect of other parasympathomimetics [cholinergics], initial encounter: Secondary | ICD-10-CM

## 2011-12-22 DIAGNOSIS — D696 Thrombocytopenia, unspecified: Secondary | ICD-10-CM

## 2011-12-22 LAB — CBC
HCT: 36.3 % — ABNORMAL LOW (ref 39.0–52.0)
MCH: 30.5 pg (ref 26.0–34.0)
MCV: 92.1 fL (ref 78.0–100.0)
Platelets: 180 10*3/uL (ref 150–400)
RDW: 14.4 % (ref 11.5–15.5)

## 2011-12-22 LAB — BASIC METABOLIC PANEL
BUN: 10 mg/dL (ref 6–23)
Calcium: 8.4 mg/dL (ref 8.4–10.5)
Creatinine, Ser: 0.81 mg/dL (ref 0.50–1.35)
GFR calc Af Amer: >90 mL/min (ref >90)

## 2011-12-22 MED ORDER — MENTHOL 3 MG MT LOZG
1.0000 | LOZENGE | OROMUCOSAL | Status: DC | PRN
  Filled 2011-12-22: qty 9

## 2011-12-22 MED ORDER — ONDANSETRON HCL 4 MG PO TABS
4.0000 mg | ORAL_TABLET | Freq: Four times a day (QID) | ORAL | Status: DC | PRN
  Administered 2011-12-23 – 2011-12-31 (×2): 4 mg via ORAL
  Filled 2011-12-22 (×2): qty 1

## 2011-12-22 MED ORDER — RIVASTIGMINE 9.5 MG/24HR TD PT24
9.5000 mg | MEDICATED_PATCH | Freq: Every day | TRANSDERMAL | Status: DC
  Administered 2011-12-23 – 2012-01-03 (×12): 9.5 mg via TRANSDERMAL
  Filled 2011-12-22 (×15): qty 1

## 2011-12-22 MED ORDER — ALBUTEROL SULFATE (5 MG/ML) 0.5% IN NEBU: 2.5000 mg | INHALATION_SOLUTION | Freq: Four times a day (QID) | RESPIRATORY_TRACT | Status: DC | PRN

## 2011-12-22 MED ORDER — CARBIDOPA-LEVODOPA 25-100 MG PO TABS
3.0000 | ORAL_TABLET | Freq: Three times a day (TID) | ORAL | Status: DC
  Administered 2011-12-22 – 2012-01-03 (×37): 3 via ORAL
  Filled 2011-12-22 (×39): qty 3

## 2011-12-22 MED ORDER — SORBITOL 70 % SOLN
30.0000 mL | Freq: Every day | Status: DC | PRN
  Administered 2011-12-23 – 2011-12-27 (×2): 30 mL via ORAL
  Filled 2011-12-22 (×2): qty 30

## 2011-12-22 MED ORDER — HEPARIN SODIUM (PORCINE) 5000 UNIT/ML IJ SOLN
5000.0000 [IU] | Freq: Three times a day (TID) | INTRAMUSCULAR | Status: DC
  Administered 2011-12-22 – 2012-01-03 (×35): 5000 [IU] via SUBCUTANEOUS
  Filled 2011-12-22 (×39): qty 1

## 2011-12-22 MED ORDER — CARBIDOPA-LEVODOPA CR 25-100 MG PO TBCR
1.0000 | EXTENDED_RELEASE_TABLET | Freq: Every day | ORAL | Status: DC
  Administered 2011-12-22 – 2012-01-02 (×11): 1 via ORAL
  Filled 2011-12-22 (×15): qty 1

## 2011-12-22 MED ORDER — ACETAMINOPHEN 325 MG PO TABS
325.0000 mg | ORAL_TABLET | ORAL | Status: DC | PRN
  Administered 2011-12-24: 650 mg via ORAL
  Filled 2011-12-22: qty 2

## 2011-12-22 MED ORDER — ONDANSETRON HCL 4 MG/2ML IJ SOLN: 4.0000 mg | Freq: Four times a day (QID) | INTRAMUSCULAR | Status: DC | PRN

## 2011-12-22 MED ORDER — POLYETHYLENE GLYCOL 3350 17 G PO PACK
17.0000 g | PACK | Freq: Every day | ORAL | Status: DC | PRN
  Filled 2011-12-22: qty 1

## 2011-12-22 MED ORDER — DULOXETINE HCL 60 MG PO CPEP
60.0000 mg | ORAL_CAPSULE | Freq: Every day | ORAL | Status: DC
  Administered 2011-12-23 – 2012-01-03 (×12): 60 mg via ORAL
  Filled 2011-12-22 (×14): qty 1

## 2011-12-22 NOTE — H&P (Signed)
Physical Medicine and Rehabilitation Admission H&P    No chief complaint on file. : HPI: Paul Callahan is a 77 y.o. right-handed male with history of Parkinson's disease as well as colon cancer with resection and did receive [facility] in 2009 after back surgery. Patient required some assistance with dressing and bathing at home but otherwise was independent using an assisted device. Patient does have a chronic right foot drop following back surgery 2009 uses an AFO.Marland Kitchen Admitted 12/16/2011 with abdominal pain with associated nausea vomiting. X-rays and imaging revealed partial small bowel obstruction likely due to adhesions from previous partial colectomy. A nasogastric tube was placed. General surgery was consulted with conservative management. Latest followup CT of the abdomen showed partial small bowel obstruction. There is fecalization of the small bowel, proximal to the transition point indicates chronicity. There is improved gas pattern. Patient maintained on IV fluids for necessary hydration. His diet was to be slowly advanced after nasogastric tube removed. He was on subcutaneous heparin for DVT prophylaxis. He continues on his current regimen for Parkinson's disease. Patient noted to have bouts of acute delirium sundowning with noted history of dementia. Physical therapy evaluation completed  recommendations for physical medicine rehabilitation consult to consider inpatient rehabilitation  services   Patient was felt to be a good candidate for inpatient rehabilitation services was admitted for comprehensive rehabilitation program.  Review of Systems  Gastrointestinal: Positive for nausea and vomiting.  Psychiatric/Behavioral:  Dementia . All other systems being negative   Past Medical History  Diagnosis Date  . Parkinson disease   . Dementia   . Depression   . Bowel obstruction 12/15/2011  . Neuromuscular disorder     hx of parkinsons  . Cancer     hx of colon cancer  . Arthritis    Past  Surgical History  Procedure Date  . Eye surgery   . Cataracts   . Hernia repair   . Back surgery     x 5   No family history on file. Social History:  reports that he has quit smoking. He has never used smokeless tobacco. He reports that he does not drink alcohol or use illicit drugs. Allergies:  Allergies  Allergen Reactions  . Amantadines Other (See Comments)    Weakness in legs and hallucinations  . Fentanyl Other (See Comments)    Confusion and disorientation   . Gabapentin Other (See Comments)    Weakness in legs and hallucinations  . Oxycodone     Confusion and disorientation   . Phenergan (Promethazine Hcl)     Confusion and disorientation    Medications Prior to Admission  Medication Sig Dispense Refill  . acetaminophen (TYLENOL) 500 MG tablet Take 1,000 mg by mouth 2 (two) times daily.      . calcium carbonate (OS-CAL) 600 MG TABS Take 600 mg by mouth 2 (two) times daily with a meal.      . carbidopa-levodopa (SINEMET CR) 25-100 MG per tablet Take 1 tablet by mouth at bedtime.      . carbidopa-levodopa (SINEMET IR) 25-100 MG per tablet Take 3 tablets by mouth 3 (three) times daily.      . celecoxib (CELEBREX) 200 MG capsule Take 200 mg by mouth 2 (two) times daily.      . cholecalciferol (VITAMIN D) 1000 UNITS tablet Take 2,000 Units by mouth daily.      . DULoxetine (CYMBALTA) 60 MG capsule Take 60 mg by mouth daily.      . Multiple Vitamin (  MULTIVITAMIN WITH MINERALS) TABS Take 1 tablet by mouth daily.      . pregabalin (LYRICA) 150 MG capsule Take 150 mg by mouth 2 (two) times daily.      . rivastigmine (EXELON) 9.5 mg/24hr Place 1 patch onto the skin daily.      Marland Kitchen zinc gluconate 50 MG tablet Take 50 mg by mouth daily.        Home:     Functional History:    Functional Status:  Mobility:          ADL:    Cognition: Cognition Orientation Level: Oriented to person;Oriented to place     There were no vitals taken for this visit. Physical Exam    HENT:  Head: Normocephalic.  Neck: Neck supple. No thyromegaly present.  Cardiovascular: Regular rhythm.  Pulmonary/Chest: No respiratory distress.  Abdominal: He exhibits distension. There is tenderness. There is rebound.  Neurological: He is alert.  Patient is a poor historian. Noted delay in processing. He could not state his age or place needing multiple cues. He follows basic commands.  Psychiatric:  Patient limited awareness of deficits  motor strength is 4 minus/5 in bilateral deltoid, biceps, triceps, grip, 3 minus bilateral hip flexors knee extensors 2 minus right ankle dorsiflexors 3 minus in the left ankle dorsiflexors  Sensation is reduced in the right foot compared left foot.  Mood and affect are flat, also with masked facies   Results for orders placed during the hospital encounter of 12/16/11 (from the past 48 hour(s))  BASIC METABOLIC PANEL     Status: Abnormal   Collection Time   12/21/11  7:00 AM      Component Value Range Comment   Sodium 140  135 - 145 mEq/L    Potassium 3.8  3.5 - 5.1 mEq/L    Chloride 103  96 - 112 mEq/L    CO2 28  19 - 32 mEq/L    Glucose, Bld 129 (*) 70 - 99 mg/dL    BUN 9  6 - 23 mg/dL    Creatinine, Ser 4.09  0.50 - 1.35 mg/dL    Calcium 8.9  8.4 - 81.1 mg/dL    GFR calc non Af Amer 66 (*) >90 mL/min    GFR calc Af Amer 76 (*) >90 mL/min   CBC     Status: Normal   Collection Time   12/21/11  7:00 AM      Component Value Range Comment   WBC 6.1  4.0 - 10.5 K/uL    RBC 4.25  4.22 - 5.81 MIL/uL    Hemoglobin 13.3  13.0 - 17.0 g/dL    HCT 91.4  78.2 - 95.6 %    MCV 92.0  78.0 - 100.0 fL    MCH 31.3  26.0 - 34.0 pg    MCHC 34.0  30.0 - 36.0 g/dL    RDW 21.3  08.6 - 57.8 %    Platelets 169  150 - 400 K/uL   CBC     Status: Abnormal   Collection Time   12/22/11  7:05 AM      Component Value Range Comment   WBC 5.9  4.0 - 10.5 K/uL    RBC 3.94 (*) 4.22 - 5.81 MIL/uL    Hemoglobin 12.0 (*) 13.0 - 17.0 g/dL    HCT 46.9 (*) 62.9 - 52.0  %    MCV 92.1  78.0 - 100.0 fL    MCH 30.5  26.0 - 34.0 pg  MCHC 33.1  30.0 - 36.0 g/dL    RDW 45.4  09.8 - 11.9 %    Platelets 180  150 - 400 K/uL   BASIC METABOLIC PANEL     Status: Abnormal   Collection Time   12/22/11  7:05 AM      Component Value Range Comment   Sodium 139  135 - 145 mEq/L    Potassium 3.6  3.5 - 5.1 mEq/L    Chloride 105  96 - 112 mEq/L    CO2 24  19 - 32 mEq/L    Glucose, Bld 125 (*) 70 - 99 mg/dL    BUN 10  6 - 23 mg/dL    Creatinine, Ser 1.47  0.50 - 1.35 mg/dL    Calcium 8.4  8.4 - 82.9 mg/dL    GFR calc non Af Amer 78 (*) >90 mL/min    GFR calc Af Amer >90  >90 mL/min    No results found.  Post Admission Physician Evaluation: 1. Functional deficits secondary  to deconditioning after small bowel obstruction in a patient with Parkinson's disease. 2. Patient is admitted to receive collaborative, interdisciplinary care between the physiatrist, rehab nursing staff, and therapy team. 3. Patient's level of medical complexity and substantial therapy needs in context of that medical necessity cannot be provided at a lesser intensity of care such as a SNF. 4. Patient has experienced substantial functional loss from his/her baseline which was documented above under the "Functional History" and "Functional Status" headings.  Judging by the patient's diagnosis, physical exam, and functional history, the patient has potential for functional progress which will result in measurable gains while on inpatient rehab.  These gains will be of substantial and practical use upon discharge  in facilitating mobility and self-care at the household level. 5. Physiatrist will provide 24 hour management of medical needs as well as oversight of the therapy plan/treatment and provide guidance as appropriate regarding the interaction of the two. 6. 24 hour rehab nursing will assist with bladder management, bowel management, safety, skin/wound care, disease management, medication  administration and patient education  and help integrate therapy concepts, techniques,education, etc. 7. PT will assess and treat for:  Pre-gait training, gait training, endurance, safety, equipment.  Goals are: supervision with mobility. 8. OT will assess and treat for: ADLs, cognitive perceptual skills, neuromuscular reeducation, equipment, safety, and her.   Goals are: supervision level with upper body and min assist level lower body ADLs. 9. SLP will assess and treat for: memory, attention, concentration, thought organization, orientation.  Goals are: Baseline cognitive levels. 10. Case Management and Social Worker will assess and treat for psychological issues and discharge planning. 11. Team conference will be held weekly to assess progress toward goals and to determine barriers to discharge. 12. Patient will receive at least 3 hours of therapy per day at least 5 days per week. 13. ELOS: every week      Prognosis:  good   Medical Problem List and Plan: 1. small bowel obstruction with deconditioning as well as Parkinson's disease 2. DVT Prophylaxis/Anticoagulation: Subcutaneous heparin for DVT prophylaxis 3. Decreased nutritional storage. Diet has slowly been advanced after small bowel obstruction 4. Parkinson's disease. Sinemet as directed and continue current medication regimen 5. Dementia. Continue Exelon patch  12/22/2011, 4:56 PM

## 2011-12-22 NOTE — Plan of Care (Signed)
Problem: Phase III Progression Outcomes Goal: Voiding independently Outcome: Adequate for Discharge Condom cath at night  Problem: Discharge Progression Outcomes Goal: Activity appropriate for discharge plan Outcome: Adequate for Discharge Discharge to inpatient rehab.

## 2011-12-22 NOTE — Progress Notes (Signed)
1500 Patient transferred to inpatient rehab. Patient transported by wheelchair with wife.  VSS, no complaints of pain, transferred in stable condition.

## 2011-12-22 NOTE — Progress Notes (Signed)
I am admitting pt to inpt rehab today. Wife at bedside and she and pt are in agreement. Please call [phone] with questions.

## 2011-12-22 NOTE — Progress Notes (Signed)
Patient ID: JOHNTA COUTS, male   DOB: 03/08/1924, 78 y.o.   MRN: [id number] Patient ID: KIPPY GOHMAN, male   DOB: April 10, 1924, 78 y.o.   MRN: [id number] Patient ID: SHAMEL GERMOND, male   DOB: April 11, 1924, 78 y.o.   MRN: [id number] SBO (small bowel obstruction)  Subjective: Alert this am, no reports of any NV, or other complaints today. He has tolerated clear liquid diet well and is ready for "something more".  Objective: Vital signs in last 24 hours: Temp:  [99 F (37.2 C)-100.5 F (38.1 C)] 99 F (37.2 C) (06/17 0549) Pulse Rate:  [62-76] 76  (06/17 0549) Resp:  [20-25] 20  (06/17 0549) BP: (147-165)/(67-70) 165/70 mmHg (06/17 0549) SpO2:  [92 %-95 %] 95 % (06/17 0549) Last BM Date: 12/18/11  Intake/Output from previous day: 06/16 0701 - 06/17 0700 In: 2033 [I.V.:2033] Out: 1650 [Urine:1450; Emesis/NG output:200] Intake/Output this shift:    General appearance:Awake, alert, orientated, no distress, on room air. Resp: clear to auscultation bilaterally GI: Soft, + BS,BM, no tenderness, no bloating.  Lab Results:  No results found for this or any previous visit (from the past 24 hour(s)).   Studies/Results Radiology     MEDS, Scheduled    . carbidopa-levodopa  1 tablet Oral QHS  . carbidopa-levodopa  3 tablet Oral TID WC  . DULoxetine  60 mg Oral Daily  . heparin  5,000 Units Subcutaneous Q8H  . rivastigmine  9.5 mg Transdermal Daily     Assessment: SBO (small bowel obstruction)  Plan: 1. Continue to advance diet. 2. Agree with medicine, should be ready for transfer to in-patient rehab.   LOS: 3 days    Currie Paris, MD, [facility], [place] [phone]   12/19/2011 8:16 AM

## 2011-12-22 NOTE — Progress Notes (Signed)
Physical Therapy Treatment Patient Details Name: Paul Callahan MRN: [id number] DOB: February 14, 1924 Today's Date: 12/22/2011 Time: 4098-1191 PT Time Calculation (min): 24 min  PT Assessment / Plan / Recommendation Comments on Treatment Session  Better bed mobility today, continues to be a big fall risk    Follow Up Recommendations  Inpatient Rehab    Barriers to Discharge        Equipment Recommendations  Defer to next venue    Recommendations for Other Services    Frequency Min 3X/week   Plan Discharge plan remains appropriate;Frequency remains appropriate    Precautions / Restrictions Precautions Precautions: Fall       Mobility  Bed Mobility Bed Mobility: Supine to Sit Supine to Sit: HOB elevated;With rails;3: Mod assist (45 degrees) Sitting - Scoot to Edge of Bed: 4: Min assist Details for Bed Mobility Assistance: mod facilitation and cueing to sequencing, increased time to complete task; use of pad to assist with scooting  Transfers Sit to Stand: 3: Mod assist;From elevated surface;From bed;With upper extremity assist;From chair/3-in-1 Stand to Sit: 3: Mod assist;To chair/3-in-1;With upper extremity assist Details for Transfer Assistance: cues for hand placement and anterior shift of weight over BOS, cues for upright posture and facilitation for power up and obtaining balance; sit<->stand x2 for strengthening Ambulation/Gait Ambulation/Gait Assistance: 4: Min assist;3: Mod assist Ambulation Distance (Feet): 200 Feet Assistive device: Rolling walker Ambulation/Gait Assistance Details: continuous encouragement and facilitation for upright posture, forward directed gait, cues for decreased speed and improved accuracy especially with turning Gait Pattern: Trunk flexed    Exercises      PT Goals Acute Rehab PT Goals PT Goal: Supine/Side to Sit - Progress: Progressing toward goal PT Goal: Sit to Stand - Progress: Progressing toward goal PT Goal: Stand to Sit - Progress:  Progressing toward goal PT Transfer Goal: Bed to Chair/Chair to Bed - Progress: Progressing toward goal PT Goal: Stand - Progress: Progressing toward goal PT Goal: Ambulate - Progress: Progressing toward goal  Visit Information  Last PT Received On: 12/22/11 Assistance Needed: +1    Subjective Data  Subjective: I feel a little sick on my stomach   Cognition  Overall Cognitive Status: Impaired Area of Impairment: Attention;Memory;Safety/judgement;Problem solving Orientation Level: Appears intact for tasks assessed Behavior During Session: [facility] for tasks performed Current Attention Level: Sustained Safety/Judgement: Decreased safety judgement for tasks assessed    Balance  Static Standing Balance Static Standing - Balance Support: No upper extremity supported Dynamic Standing Balance Dynamic Standing - Comments: trunk extension with verbal and manual facilitation for upright posture  End of Session PT - End of Session Equipment Utilized During Treatment: Gait belt Activity Tolerance: Patient tolerated treatment well;Patient limited by fatigue Patient left: in chair;with call bell/phone within reach;with family/visitor present    [facility] HELEN 12/22/2011, 10:14 AM

## 2011-12-22 NOTE — Progress Notes (Signed)
I await tolerance of regular diet before admitting pt to CIR. Noted upgrade ordered this morning. I will follow up today. [phone]

## 2011-12-22 NOTE — Progress Notes (Signed)
Pt admitted from 5100 to room 4140, Pt alert to person and place,Pt and wife educated on rehab unit, rehab schedule and safety plan, pt and wife verbalized an understanding, pt in bed with call bell in reach, pt denies pain, See [facility] for full assessment

## 2011-12-22 NOTE — Discharge Instructions (Signed)
Intestinal Obstruction An intestinal obstruction comes from a physical blockage in the bowel. Different problems in the bowel may cause these blockages.  CAUSES   Adhesions from previous surgeries   Cancer or tumor   A hernia, which is a condition in which a portion of the bowel bulges out through an opening or weakness in the abdomen (belly). This sometimes squeezes the bowel.   A swallowed object.   Blockage (impaction) with worms is common in third world countries.   A twisting of the bowel or telescoping of a portion of the bowel into another portion (intussusceptions)   Anything that stops food from going through from the stomach to the anus.  SYMPTOMS  Symptoms of bowel obstruction may result in your belly getting bigger (bloating), nausea, vomiting, explosive diarrhea, explosive stool. You may not be able to hear your normal bowel sounds (such as "growling in your stomach"). You may also stop having bowel movements or passing gas. DIAGNOSIS  Usually this condition is diagnosed with a history and an examination. Often, lab studies (blood work) and x-rays may be used to find the cause. TREATMENT  The main treatment of this condition is to rest the intestine (gut). Often times the obstruction may relieve itself and allow the gut to start working again. Think of the gut like a balloon that is blown up (filled with trapped food and water) which has squeezed into a hole or area which it cannot get through.   If the obstruction is complete, an NG tube (nasogastric) is passed through the nose and into the stomach. It is then connected to suction to keep the stomach emptied out. This also helps treat the nausea and vomiting.   If there is an imbalance in the electrolytes, they are corrected with intravenous fluids. These have the proper chemicals in them to correct the problem.   If the reason for the obstruction (blockage) does not get better with conservative (non-surgical) treatment,  surgery may be necessary. Sometimes, surgery is done immediately if your surgeon knows that the problem is not going to get better with conservative treatment.  PROGNOSIS  Depending on what the problem is, most of these problems can be treated by your caregivers with good results. Your surgeon will discuss the best course of action to take, with you or your loved ones. FOLLOWING SURGERY Seek immediate medical attention if you have:  Increasing abdominal pain, repeated vomiting, dehydration or fainting.   Severe weakness, chest pain or back pain.   Blood in your vomit, stool or you have tarry stool.  Document Released: 09/10/2003 Document Revised: 06/09/2011 Document Reviewed: 02/08/2008 [employer] Patient Information 2012 [employer]. 

## 2011-12-22 NOTE — Discharge Summary (Signed)
Physician Discharge Summary  Paul Callahan ZOX:[id number] DOB: December 22, 1923 DOA: 12/16/2011  PCP: [name], MD  Admit date: 12/16/2011 Discharge date: 12/22/2011  Recommendations for Outpatient Follow-up:  1. Pt will be discharged to inpatient rehabilitation today for further management.   Discharge Diagnoses:  Principal Problem:  *SBO (small bowel obstruction) Active Problems:  Acute kidney injury  Thrombocytopenia   Discharge Condition: Stable  Diet recommendation: He is currently tolerating regular diet.  History of present illness:  78 y.o. Male with PMH of colon cancer status post colon resection presents with onset of abdominal pain at approximately 10:30 PM last night. Pain was described as sharp andl over his abdomen. He developed nausea and vomiting upon arrival to the emergency room this morning. Patient states that his stomach feels distended. Patient took Prevacid the night prior to admission without relief. He has not had pain like this in the past. Last bowel movement was one day prior to admission. No blood was noted. Patient has not been passing gas overnight. He has also developed anorexia.  Hospital Course:   Principal Problem:  *SBO (small bowel obstruction) - no surgical intervention has been required - pt was treated symptomatically, kept NPO - pt was provided analgesia for adequate pain control and diet was advanced - pt is currently tolerating regular diet - ambulation encouraged as possible  Active Problems:  Acute kidney injury - creatinine and GFR remain stable and at pt's baseline   Thrombocytopenia - this has remained stable and at pt's baseline  Procedures:  None  Consultations:  Surgery  Discharge Exam: Filed Vitals:   12/22/11 0546  BP: 143/73  Pulse: 62  Temp: 97.8 F (36.6 C)  Resp: 16   Filed Vitals:   12/21/11 0625 12/21/11 1357 12/21/11 2210 12/22/11 0546  BP: 150/90 148/68 152/75 143/73  Pulse: 72 70 72 62  Temp:  99 F (37.2 C) 99.7 F (37.6 C) 98.4 F (36.9 C) 97.8 F (36.6 C)  TempSrc:  Oral  Oral  Resp: 17 18 18 16   Height:      Weight:      SpO2: 96% 95% 95% 95%    General: Pt is alert, follows commands appropriately, not in acute distress Cardiovascular: Regular rate and rhythm, S1/S2 +, no murmurs, no rubs, no gallops Respiratory: Clear to auscultation bilaterally, no wheezing, no crackles, no rhonchi Abdominal: Soft, non tender, non distended, bowel sounds +, no guarding Extremities: no edema, no cyanosis, pulses palpable bilaterally DP and PT Neuro: Grossly nonfocal  Discharge Instructions   Medication List  As of 12/22/2011  2:08 PM   TAKE these medications         acetaminophen 500 MG tablet   Commonly known as: TYLENOL   Take 1,000 mg by mouth 2 (two) times daily.      calcium carbonate 600 MG Tabs   Commonly known as: OS-CAL   Take 600 mg by mouth 2 (two) times daily with a meal.      carbidopa-levodopa 25-100 MG per tablet   Commonly known as: SINEMET IR   Take 3 tablets by mouth 3 (three) times daily.      carbidopa-levodopa 25-100 MG per tablet   Commonly known as: SINEMET CR   Take 1 tablet by mouth at bedtime.      celecoxib 200 MG capsule   Commonly known as: CELEBREX   Take 200 mg by mouth 2 (two) times daily.      cholecalciferol 1000 UNITS tablet   Commonly known  as: VITAMIN D   Take 2,000 Units by mouth daily.      DULoxetine 60 MG capsule   Commonly known as: CYMBALTA   Take 60 mg by mouth daily.      multivitamin with minerals Tabs   Take 1 tablet by mouth daily.      pregabalin 150 MG capsule   Commonly known as: LYRICA   Take 150 mg by mouth 2 (two) times daily.      rivastigmine 9.5 mg/24hr   Commonly known as: EXELON   Place 1 patch onto the skin daily.      zinc gluconate 50 MG tablet   Take 50 mg by mouth daily.           Follow-up Information    Follow up with [name], MD. (As needed if symptoms worsen)    Contact  information:   [address] [place] [address] [phone]           The results of significant diagnostics from this hospitalization (including imaging, microbiology, ancillary and laboratory) are listed below for reference.    Significant Diagnostic Studies: Ct Abdomen Pelvis W Contrast 12/16/2011    IMPRESSION:   1.  Partial small-bowel obstruction.  The transition point appears to be within the mid ileum in the left infraumbilical region as described above.  Fecalization of the small bowel proximal to the level of obstruction suggests some chronicity.  2.  No evidence of bowel perforation or abscess. 3.  A small low-density lesion within the pancreatic head is likely incidental. Given the spinal stimulator and presumed inability to undergo MRI, I would suggest CT followup in approximately 6 months to assess stability.    Dg Abd 2 Views 12/20/2011    IMPRESSION:  Gas pattern compatible with partial small bowel obstruction, continuing to slowly improve.  No free air.     Dg Abd 2 Views 12/19/2011   IMPRESSION:  Improved gas pattern since 12/16/2011, mildly improved since yesterday.  Appearance suggests a slowly resolving small bowel obstruction.  No free air.    Dg Abd Portable 2v 12/18/2011  IMPRESSION:  Mid/distal small bowel obstruction with increasing caliber of dilated loops of mid/distal small bowel.   Terminal ileum appears relatively decompressed, compatible with transition point proximal to this location.     Microbiology: No results found for this or any previous visit (from the past 240 hour(s)).   Labs: Basic Metabolic Panel:  Lab 12/22/11 9528 12/21/11 0700 12/19/11 0735 12/18/11 0011 12/17/11 0625 12/16/11 1455  NA 139 140 139 135 138 --  K 3.6 3.8 3.6 3.8 4.2 --  CL 105 103 103 101 103 --  CO2 24 28 27 25 27  --  GLUCOSE 125* 129* 134* 149* 140* --  BUN 10 9 10 16  28* --  CREATININE 0.81 1.00 0.77 0.83 1.01 --  CALCIUM  8.4 8.9 8.7 8.6 9.0 --  MG -- -- -- -- -- 2.2  PHOS -- -- -- -- -- --   Liver Function Tests:  Lab 12/17/11 0625 12/16/11 0620  AST 28 29  ALT 18 6  ALKPHOS 72 86  BILITOT 1.4* 0.7  PROT 6.1 7.6  ALBUMIN 3.1* 4.0    Lab 12/16/11 0620  LIPASE 21  AMYLASE --   CBC:  Lab 12/22/11 0705 12/21/11 0700 12/18/11 0011 12/17/11 0625 12/16/11 1455 12/16/11 0620  WBC 5.9 6.1 3.5* 3.2* 7.0 --  NEUTROABS -- -- 1.9 -- -- 7.4  HGB 12.0* 13.3  12.4* 12.7* 14.8 --  HCT 36.3* 39.1 37.2* 38.7* 44.4 --  MCV 92.1 92.0 93.9 93.9 94.5 --  PLT 180 169 124* 131* 151 --   Time coordinating discharge: Over 30 minutes  Signed:  Debbora Presto, MD  [facility] 12/22/2011, 2:08 PM Pager [phone]  If 7PM-7AM, please contact night-coverage [email or web address] Password [email or web address]

## 2011-12-22 NOTE — Progress Notes (Signed)
Patient information reviewed and entered into [employer] system by Khyron Garno, RN, CRRN, PPS Coordinator.  Information including medical coding and functional independence measure will be reviewed and updated through discharge.     Per nursing patient was given "Data Collection Information Summary for Patients in Inpatient Rehabilitation Facilities with attached "Privacy Act Statement-Health Care Records" upon admission.   

## 2011-12-22 NOTE — PMR Pre-admission (Signed)
PMR Admission Coordinator Pre-Admission Assessment  Patient: Paul Callahan is an 78 y.o., male MRN: [id number] DOB: Nov 13, 1923 Height: 5\' 9"  (175.3 cm) Weight: 83.462 kg (184 lb)  Insurance Information HMO:     PPO:      PCP:      IPA:      80/20: yes     OTHER: no hmo PRIMARY: medicare a and b       Policy#: [id number]      Subscriber: Paul Callahan CM Name:       Phone#:      Fax#:  Pre-Cert#:       Employer:  Benefits:  Phone #: vision share     Name: 12/21/11 Eff. Date: a 12/03/90 b 01/02/92     Deduct: $1184      Out of Pocket Max: none      Life Max: none CIR: 100%      SNF: 20 full days LBD 12/05/08 Outpatient: 80%     Co-Pay: 20% Home Health: 100%      Co-Pay: none DME: 80%     Co-Pay: 20% Providers: Paul Callahan choice  SECONDARY: Primary Physician Care      Policy#: [id number]      Subscriber: Paul Callahan No auth required with medicare primary  Emergency Contact Information Contact Information    Name Relation Home Work Mobile   Kearse,Claudette Spouse [phone]  [phone]     Current Medical History  Patient Admitting Diagnosis: Deconditioning after SBO, Parkinsons disease History of Present Illness: Paul Callahan is a 78 y.o. right-handed male with history of Parkinson's disease as well as colon cancer with resection. Admitted 12/16/2011 with abdominal pain with associated nausea vomiting. X-rays and imaging revealed partial small bowel obstruction likely due to adhesions from previous partial colectomy. A nasogastric tube was placed. General surgery was consulted with conservative management. Patient maintained on IV fluids for necessary hydration. Paul Callahan diet was to be slowly advanced after nasogastric tube removed. He was on subcutaneous heparin for DVT prophylaxis. He continues on Paul Callahan current regimen for Parkinson's disease. Patient noted to have bouts of acute delirium sundowning with noted history of dementia.  Advance diet to Dysphagia 3 diet with thin liquids.    According to the patient's wife, the  patient had been at [facility] following back surgery in 2009 under Dr. Riley Kill  Patient required some assistance with dressing and bathing at home but otherwise was able to toilet independently using an assisted  Has had a chronic right foot drop following back surgery in 2009 and has been using AFO    Past Medical History  Past Medical History  Diagnosis Date  . Parkinson disease   . Dementia   . Depression   . Bowel obstruction 12/15/2011  . Neuromuscular disorder     hx of parkinsons  . Cancer     hx of colon cancer  . Arthritis     Family History  family history is not on file.  Prior Rehab/Hospitalizations: CIR 2009 after back surgery   Current Medications  Current facility-administered medications:acetaminophen (TYLENOL) suppository 650 mg, 650 mg, Rectal, Q6H PRN, Marinda Elk, MD, 650 mg at 12/18/11 2123;  acetaminophen (TYLENOL) tablet 650 mg, 650 mg, Oral, Q6H PRN, Marinda Elk, MD;  albuterol (PROVENTIL) (5 MG/ML) 0.5% nebulizer solution 2.5 mg, 2.5 mg, Nebulization, Q6H PRN, [facility], RRT carbidopa-levodopa (SINEMET CR) 25-100 MG per tablet controlled release 1 tablet, 1 tablet, Oral, QHS, Marinda Elk, MD, 1 tablet at 12/21/11 2313;  carbidopa-levodopa (  SINEMET IR) 25-100 MG per tablet immediate release 3 tablet, 3 tablet, Oral, TID WC, Marinda Elk, MD, 3 tablet at 12/22/11 1202 dextrose 5 % and 0.45% NaCl 1,000 mL with potassium chloride 20 mEq infusion, , Intravenous, Continuous, Clydia Llano, MD, Last Rate: 75 mL/hr at 12/22/11 0110;  DULoxetine (CYMBALTA) DR capsule 60 mg, 60 mg, Oral, Daily, Marinda Elk, MD, 60 mg at 12/22/11 1103;  heparin injection 5,000 Units, 5,000 Units, Subcutaneous, Q8H, Marinda Elk, MD, 5,000 Units at 12/22/11 1414 menthol-cetylpyridinium (CEPACOL) lozenge 3 mg, 1 lozenge, Oral, PRN, Clydia Llano, MD, 3 mg at 12/17/11 1320;  ondansetron (ZOFRAN) injection 4 mg, 4 mg, Intravenous, Q6H PRN, Marinda Elk, MD;  ondansetron [facility]) tablet 4 mg, 4 mg, Oral, Q6H PRN, Marinda Elk, MD;  phenol [facility]) mouth spray 1 spray, 1 spray, Mouth/Throat, PRN, Marinda Elk, MD rivastigmine (EXELON) 9.5 mg/24hr 9.5 mg, 9.5 mg, Transdermal, Daily, Marinda Elk, MD, 9.5 mg at 12/22/11 1103  Paul Callahan Current Diet: Dysphagia  Precautions / Restrictions Precautions Precautions: Fall Restrictions Weight Bearing Restrictions: No Other Position/Activity Restrictions: Paul Callahan wears AFO (rocker?) on RLE from prior nerve injury, Paul Callahan also with Parkinsons Disease   Prior Activity Level   Home Assistive Devices / Equipment Home Assistive Devices/Equipment: Cane (specify quad or straight);Other (Comment);Eyeglasses;Dentures (specify type);Wheelchair (STRAIGHT CANE  partials) Home Adaptive Equipment: Bedside commode/3-in-1;Walker - four wheeled;Grab bars in shower;Tub transfer bench  Prior Functional Level Prior Function Level of Independence: Needs assistance Needs Assistance: Dressing;Meal Prep;Light Housekeeping Dressing: Moderate Meal Prep: Total Light Housekeeping: Total Able to Take Stairs?: Yes Driving: No Vocation: Retired Comments: Paul Callahan recently D/Ced fromoutpt Paul Callahan neuro clinic.with excellent results.  Current Functional Level Cognition  Arousal/Alertness: Awake/alert Overall Cognitive Status: Impaired Current Attention Level: Sustained Memory: Decreased recall of precautions Orientation Level: Oriented to person Safety/Judgement: Decreased safety judgement for tasks assessed Cognition - Other Comments: MOre alert today. Mod A with functional basic problem solving.    Extremity Assessment (includes Sensation/Coordination)  RUE ROM/Strength/Tone:  (see OT note)  RLE ROM/Strength/Tone: Deficits RLE ROM/Strength/Tone Deficits: in sitting Paul Callahan unable to get full extension in Paul Callahan knee with a pretty firm end feel, grossly 4-/5 knee extension and flexion RLE Sensation:   (not tested, no c/o numbness or tingling) RLE Coordination: Deficits RLE Coordination Deficits: bradykinetic movement, shuffled gait    ADLs  Eating/Feeding: Performed;Set up;Supervision/safety (Beginning to eat thin liquids) Where Assessed - Eating/Feeding: Edge of bed Grooming: Simulated;Maximal assistance Where Assessed - Grooming: Supported sitting Upper Body Bathing: Simulated;+1 Total assistance Where Assessed - Upper Body Bathing: Supported sitting Lower Body Bathing: Simulated;+1 Total assistance Where Assessed - Lower Body Bathing: Supported sit to stand Upper Body Dressing: Simulated;+1 Total assistance Where Assessed - Upper Body Dressing: Supported sitting Lower Body Dressing: Simulated;+1 Total assistance Where Assessed - Lower Body Dressing: Sopported sit to stand Toilet Transfer: Simulated;+2 Total assistance Toilet Transfer: Patient Percentage: 80% Toilet Transfer Method: Sit to stand;Stand pivot Acupuncturist: Other (comment) (bed - chair) Equipment Used: Gait belt;Rolling walker Transfers/Ambulation Related to ADLs: Able to ambulate with +1 with mod A. Paul Callahan limited by weakness and parkinson's symptoms ADL Comments: total A at this time. Paul Callahan most likely not receiving full parkinson's meds due to NG tube, which exacerbate Paul Callahan symptoms    Mobility  Bed Mobility: Supine to Sit Rolling Right: 3: Mod assist Rolling Right: Patient Percentage: 70% Right Sidelying to Sit: 3: Mod assist Right Sidelying to Sit: Patient Percentage: 60% Supine to Sit: HOB  elevated;With rails;3: Mod assist (45 degrees) Sitting - Scoot to Edge of Bed: 4: Min assist    Transfers  Transfers: Sit to Stand;Stand to Sit Sit to Stand: 3: Mod assist;From elevated surface;From bed;With upper extremity assist;From chair/3-in-1 Sit to Stand: Patient Percentage: 70% Stand to Sit: 3: Mod assist;To chair/3-in-1;With upper extremity assist    Ambulation / Gait / Stairs / Wheelchair Mobility   Ambulation/Gait Ambulation/Gait Assistance: 4: Min assist;3: Mod assist Ambulation Distance (Feet): 200 Feet Assistive device: Rolling walker Ambulation/Gait Assistance Details: continuous encouragement and facilitation for upright posture, forward directed gait, cues for decreased speed and improved accuracy especially with turning Gait Pattern: Trunk flexed    Posture / Balance Dynamic Sitting Balance Dynamic Sitting - Balance Support: No upper extremity supported Dynamic Sitting - Level of Assistance: 4: Min assist Dynamic Sitting - Balance Activities: Reaching across midline;Trunk control activities Dynamic Sitting - Comments: facilitation for improved upright posture; encourage head/neck midline positioning; with assist Paul Callahan initiated trunk rotation activities  Static Standing Balance Static Standing - Balance Support: No upper extremity supported Static Standing - Level of Assistance: 4: Min assist (poor tolerance, shaking, fatigued) Static Standing - Comment/# of Minutes: stood x1 minute but with poor tolerance, shaking Dynamic Standing Balance Dynamic Standing - Balance Support: Bilateral upper extremity supported Dynamic Standing - Level of Assistance: 4: Min assist Dynamic Standing - Comments: trunk extension with verbal and manual facilitation for upright posture     Previous Home Environment Living Arrangements: Spouse/significant other Lives With: Spouse Available Help at Discharge: Family;Personal care attendant (personal care attendant 2x/wk to assist with dressing/bathin) Type of Home: House Home Layout: Two level;Able to live on main level with bedroom/bathroom Home Access: Stairs to enter Entrance Stairs-Number of Steps: 1 Bathroom Shower/Tub: Health visitor:  (3in1 over toilet) Home Care Services: Yes Type of Home Care Services: Homehealth aide Home Care Agency (if known): Does not know name   Discharge Living Setting Plans for Discharge Living  Setting: Patient's home;Lives with (comment) (wife) Type of Home at Discharge: House Discharge Home Layout: Two level;Able to live on main level with bedroom/bathroom Discharge Home Access: Stairs to enter Entrance Stairs-Number of Steps: 1 step Discharge Bathroom Shower/Tub: Walk-in shower Do you have any problems obtaining your medications?: No  Social/Family/Support Systems Patient Roles: Spouse;Parent Contact Information: Fannie Alomar, wife Anticipated Caregiver: wife and hired caregiver twice weekly, can come more Anticipated Industrial/product designer Information: Home [phone], cell [phone] Ability/Limitations of Caregiver: min assist Caregiver Availability: 24/7 Discharge Plan Discussed with Primary Caregiver: Yes Is Caregiver In Agreement with Plan?: Yes Does Caregiver/Family have Issues with Lodging/Transportation while Paul Callahan is in Rehab?: No  Goals/Additional Needs Patient/Family Goal for Rehab: supervision to min assist with Paul Callahan and OT Expected length of stay: ELOS 2 weeks Dietary Needs: began Dysphagia 3 diet today and tolerated well Paul Callahan/Family Agrees to Admission and willing to participate: Yes Program Orientation Provided & Reviewed with Paul Callahan/Caregiver Including Roles  & Responsibilities: Yes  Patient Condition: This patient's condition remains as documented in the Consult dated 12/21/11, in which the Rehabilitation Physician determined and documented that the patient's condition is appropriate for intensive rehabilitative care in an inpatient rehabilitation facility.  Preadmission Screen Completed By:  Clois Dupes, 12/22/2011 2:28 PM ______________________________________________________________________   Discussed status with Dr. Wynn Banker on 12/22/11 at  1428 and received telephone approval for admission today.  Admission Coordinator:  Clois Dupes, time 4696 Date 12/22/11.

## 2011-12-22 NOTE — Plan of Care (Signed)
Overall Plan of Care [facility]) Patient Details Name: Paul Callahan MRN: [id number] DOB: 1924/06/14  Diagnosis:  Rehabilitation for deconditioning  Primary Diagnosis:    Physical deconditioning Co-morbidities: parkinson's disease, SBO  Functional Problem List  Patient demonstrates impairments in the following areas: Balance, Bladder, Bowel, Cognition, Endurance, Medication Management, Motor, Safety and Skin Integrity  Basic ADL's: grooming, bathing, dressing and toileting Advanced ADL's: N/A  Transfers:  bed mobility, bed to chair, toilet, tub/shower and car Locomotion:  ambulation and stairs  Additional Impairments:  Social Cognition   problem solving, memory and attention  Anticipated Outcomes Item Anticipated Outcome  Eating/Swallowing    Basic self-care  Min assist/Supervision  Tolieting  Min assist  Bowel/Bladder  Pt is incontinent of bowel and bladder at times  Transfers  Supervision-Min A  Locomotion  Min A  Communication  Pt is able to express basic needs  Cognition    Pain  Pt denies pain  Safety/Judgment  Pt has poor safety awareness; bed alarm and quick release   Other  Pt skin will remain intact; free of skin breakdown   Therapy Plan: PT Frequency: 1-2 X/day, 60-90 minutes;5 out of 7 days OT Frequency: 1-2 X/day, 60-90 minutes     Team Interventions: Item RN PT OT SLP SW TR Other  Self Care/Advanced ADL Retraining   x      Neuromuscular Re-Education  x x      Therapeutic Activities  x x      UE/LE Strength Training/ROM  x x      UE/LE Coordination Activities  x x      Visual/Perceptual Remediation/Compensation   x      DME/Adaptive Equipment Instruction  x x      Therapeutic Exercise  x x      Balance/Vestibular Training  x x      Patient/Family Education  x x      Cognitive Remediation/Compensation  x x      Functional Mobility Training  x x      Ambulation/Gait Training  x       Insurance underwriter         Bladder Management         Bowel Management         Disease Management/Prevention         Pain Management         Medication Management         Skin Care/Wound Management         Splinting/Orthotics  x       Discharge Planning  x x      Psychosocial Support   x                         Team Discharge Planning: Destination:  Home Projected Follow-up:  PT, OT and Home Health Projected Equipment Needs:  Tub bench and possible SW TBD Patient/family involved in discharge planning: Patient yes; No family available  MD ELOS: 2 wks Medical Rehab Prognosis:  Good Assessment: 78 yo male with PD, admitted for SBO now requiring CIR level PT/OT 24/7 rehab RN and MD

## 2011-12-22 NOTE — Progress Notes (Signed)
Clinical Social Work  CSW received referral for SNF. CSW reviewed chart which stated patient is going to CIR. CSW is signing off but available if needed.  [place], [place] [phone]

## 2011-12-22 NOTE — Progress Notes (Signed)
Patient seen and examined. Agree with A&P above. He may re-obstruct but will let him advance for now and see how he does

## 2011-12-23 DIAGNOSIS — R5381 Other malaise: Secondary | ICD-10-CM

## 2011-12-23 DIAGNOSIS — Z5189 Encounter for other specified aftercare: Secondary | ICD-10-CM

## 2011-12-23 DIAGNOSIS — G20 Parkinson's disease: Secondary | ICD-10-CM

## 2011-12-23 LAB — COMPREHENSIVE METABOLIC PANEL
ALT: 10 U/L (ref 0–53)
AST: 30 U/L (ref 0–37)
Alkaline Phosphatase: 98 U/L (ref 39–117)
Calcium: 8.9 mg/dL (ref 8.4–10.5)
Potassium: 3.6 mEq/L (ref 3.5–5.1)
Sodium: 137 mEq/L (ref 135–145)
Total Protein: 6.5 g/dL (ref 6.0–8.3)

## 2011-12-23 LAB — DIFFERENTIAL
Eosinophils Relative: 1 % (ref 0–5)
Lymphocytes Relative: 16 % (ref 12–46)
Lymphs Abs: 1.1 10*3/uL (ref 0.7–4.0)
Monocytes Absolute: 0.7 10*3/uL (ref 0.1–1.0)
Monocytes Relative: 10 % (ref 3–12)

## 2011-12-23 LAB — CBC
MCH: 30.7 pg (ref 26.0–34.0)
MCHC: 33.1 g/dL (ref 30.0–36.0)
Platelets: 206 10*3/uL (ref 150–400)
RBC: 4.3 MIL/uL (ref 4.22–5.81)

## 2011-12-23 MED ORDER — BISACODYL 10 MG RE SUPP
10.0000 mg | Freq: Once | RECTAL | Status: AC
  Administered 2011-12-23: 10 mg via RECTAL
  Filled 2011-12-23 (×2): qty 1

## 2011-12-23 NOTE — Progress Notes (Signed)
Per State Regulation 482.30 This chart was reviewed for medical necessity with respect to the patient's Admission/Duration of stay. Evals in progress. Multiple medical issues. Meryl Dare                 Nurse Care Manager            Next Review Date: 12/26/11

## 2011-12-23 NOTE — Progress Notes (Signed)
Patient ID: KESLEY GAFFEY, male   DOB: March 10, 1924, 78 y.o.   MRN: [id number] Patient ID: HELIOS KOHLMANN, male   DOB: [birth year], 78 y.o.   MRN: [id number] Patient ID: SEBASTYAN SNODGRASS, male   DOB: [birth year], 78 y.o.   MRN: [id number] Patient ID: ANDRA MATSUO, male   DOB: [birth year], 78 y.o.   MRN: [id number] SBO (small bowel obstruction)  Subjective: Alert this am, no reports of any NV, or other complaints today. Eating regular diet.   Objective: Vital signs in last 24 hours: Temp:  [99 F (37.2 C)-100.5 F (38.1 C)] 99 F (37.2 C) (06/17 0549) Pulse Rate:  [62-76] 76  (06/17 0549) Resp:  [20-25] 20  (06/17 0549) BP: (147-165)/(67-70) 165/70 mmHg (06/17 0549) SpO2:  [92 %-95 %] 95 % (06/17 0549) Last BM Date: 12/18/11  Intake/Output from previous day: 06/16 0701 - 06/17 0700 In: 2033 [I.V.:2033] Out: 1650 [Urine:1450; Emesis/NG output:200] Intake/Output this shift:    General appearance:Awake, alert, orientated, no distress, on room air. Resp: clear to auscultation bilaterally GI: Soft, + BS,BM, no tenderness, no bloating.  Lab Results:  No results found for this or any previous visit (from the past 24 hour(s)).   Studies/Results Radiology     MEDS, Scheduled    . carbidopa-levodopa  1 tablet Oral QHS  . carbidopa-levodopa  3 tablet Oral TID WC  . DULoxetine  60 mg Oral Daily  . heparin  5,000 Units Subcutaneous Q8H  . rivastigmine  9.5 mg Transdermal Daily     Assessment: SBO (small bowel obstruction)  Plan: 1. Will follow as needed.   LOS: 3 days    Currie Paris, MD, [facility], [place] [phone]   12/19/2011 8:16 AM

## 2011-12-23 NOTE — Progress Notes (Signed)
Spent four hours in rehab work today, worn out this PM. No abd pain, passing gas, no BM x 24H. Abd soft not tender

## 2011-12-23 NOTE — Progress Notes (Signed)
Social Work Assessment and Plan Social Work Assessment and Plan  Patient Details  Name: Paul Callahan MRN: [id number] Date of Birth: 1924-01-03  Today's Date: 12/23/2011  Problem List:  Patient Active Problem List  Diagnosis  . SBO (small bowel obstruction)  . Acute kidney injury  . Thrombocytopenia  . Physical deconditioning   Past Medical History:  Past Medical History  Diagnosis Date  . Parkinson disease   . Dementia   . Depression   . Bowel obstruction 12/15/2011  . Neuromuscular disorder     hx of parkinsons  . Cancer     hx of colon cancer  . Arthritis    Past Surgical History:  Past Surgical History  Procedure Date  . Eye surgery   . Cataracts   . Hernia repair   . Back surgery     x 5   Social History:  reports that he has quit smoking. He has never used smokeless tobacco. He reports that he does not drink alcohol or use illicit drugs.  Family / Support Systems Marital Status: Married Patient Roles: Spouse;Parent Spouse/Significant Other: Claudette  [phone]  [phone] Children: Daughter local and also son Other Supports: Hired caregiver-Kendra two times a week to assist with bathing-could increase her hours Anticipated Caregiver: Wife and hired caregiver Ability/Limitations of Caregiver: supervision to min assist Caregiver Availability: 24/7 Family Dynamics: Close knit family who are involved and supportive of one another.  Children visit often and make sure they are okay.  Social History Preferred language: English Religion: Methodist Cultural Background: No issues Education: [employer] Educated Read: Yes Write: Yes Employment Status: Retired Fish farm manager Issues: No issues Guardian/Conservator: None   Abuse/Neglect Physical Abuse: Denies Verbal Abuse: Denies Sexual Abuse: Denies Exploitation of patient/patient's resources: Denies Self-Neglect: Denies  Emotional Status Pt's affect, behavior adn adjustment status: Pt is  motivated and wants to get back to his prior function.  He feels he is weaker and not as stable walking.  Daughter confirms this and is hopeful he will do well here.  He has been here before and knows what to expect. Recent Psychosocial Issues: Other medical issues Pyschiatric History: History-Depression takes meds for and feels it helps.  Depression Screen score-2 he feels he will do well here.  Will continue to monitor. Substance Abuse History: No issues  Patient / Family Perceptions, Expectations & Goals Pt/Family understanding of illness & functional limitations: Pt and daughter have a good understanding of his condition and weakness, he has.  Daughter reprots he did well before and he and Mom were out and about.  he had alos not been falling like in the past. Premorbid pt/family roles/activities: Husband, Father, Grandfather, Retiree, Home owner, etc Anticipated changes in roles/activities/participation: Plans to resume Pt/family expectations/goals: Pt states: " I want to get back to where I was wlaking better than this."  Daughter states: "He is much weaker now, than before this hospitalization."  [employer]: Other (Comment) (Went to OP in the past) Premorbid Home Care/DME Agencies: Other (Comment) (Has DME from previous admits) Transportation available at discharge: Family Resource referrals recommended: Support group (specify) (Parkinson's Support Group)  Discharge Planning Living Arrangements: Spouse/significant other Support Systems: Spouse/significant other;Children;Friends/neighbors;Church/faith community Type of Residence: Private residence Insurance Resources: Administrator (specify) Investment banker, operational) Financial Resources: Restaurant manager, fast food Screen Referred: No Living Expenses: Lives with family Money Management: Patient;Spouse Do you have any problems obtaining your medications?: No Home Management: Wife Patient/Family  Preliminary Plans: Return home with wife and hired caregiver  whose hours could be increased.  Pt wants to be mod/i with a walker his previous function.  Wife has bronchitis and is home today. Social Work Anticipated Follow Up Needs: HH/OP;Support Group  Clinical Impression Pleasant gentleman who is deconditioned and weak from hospitalization.  Wants to get back to where he was.  Supportive family and aware may need to increase caregiver hours. Should do well, has been here before in 2009.  Lucy Chris 12/23/2011, 1:28 PM

## 2011-12-23 NOTE — Progress Notes (Signed)
Overall Plan of Care [facility]) Patient Details Name: JOANNE BRANDER MRN: [id number] DOB: Dec 16, 1923  Diagnosis:  Rehabilitation for deconditioning  Primary Diagnosis:    Deconditioning after small bowel obstruction Co-morbidities: Parkinson's disease, R foot drop due to radiculopathy, leg length discrepency  Functional Problem List  Patient demonstrates impairments in the following areas: Balance, Behavior, Bladder, Bowel, Cognition, Endurance, Medication Management, Motor, Nutrition, Safety, Sensory  and Skin Integritypt has mobility,balance, and cognitive deficits.  Basic ADL's: eating, grooming, bathing, dressing and toileting Advanced ADL's: NA  Transfers:  bed mobility, bed to chair, toilet, tub/shower and car Locomotion:  ambulation and wheelchair mobility  Additional Impairments:  Social Cognition   social interaction, problem solving, memory, attention and awareness  Anticipated Outcomes Item Anticipated Outcome  Eating/Swallowing    Basic self-care    Tolieting    Bowel/Bladder  Manage bowel and bladder issues  Mod. Assist by d/c/regular bm every 1-2 days  Transfers    Locomotion    Communication    Cognition    Pain  No complaints   Safety/Judgment  Demonstrates safe mobility w/ supervision  Other     Therapy Plan:         Team Interventions: Item RN PT OT SLP SW TR Other  Self Care/Advanced ADL Retraining         Neuromuscular Re-Education         Therapeutic Activities         UE/LE Strength Training/ROM         UE/LE Coordination Activities         Visual/Perceptual Remediation/Compensation         DME/Adaptive Equipment Instruction         Therapeutic Exercise         Balance/Vestibular Training         Patient/Family Education         Cognitive Remediation/Compensation         Functional Mobility Training         Ambulation/Gait Tour manager Facilitation         Bladder Management         Bowel Management         Disease Management/Prevention         Pain Management         Medication Management         Skin Care/Wound Management         Splinting/Orthotics         Discharge Planning         Psychosocial Support                            Team Discharge Planning: Destination:  Home Projected Follow-up:  Home Health Projected Equipment Needs:  Bedside Commode, Elevated Engineer, civil (consulting), Information systems manager, Biomedical engineer involved in discharge planning:  Yes  MD ELOS: 14 days Medical Rehab Prognosis:  Good Assessment: 78 yo male with parkinson's disease admitted for SBO, now deconditioned requiring CIR level PT,OT,SLP, 24/7 Rehab RN and MD

## 2011-12-23 NOTE — Progress Notes (Signed)
Patient ID: Paul Callahan, male   DOB: [birth year], 78 y.o.   MRN: [id number]  Subjective/Complaints: Slept  Ok.  Does not recall reason for hospitalization Review of Systems  Neurological: Positive for focal weakness.  Psychiatric/Behavioral: Positive for memory loss.  All other systems reviewed and are negative.  Objective: Vital Signs: Blood pressure 166/81, pulse 70, temperature 98.4 F (36.9 C), temperature source Oral, resp. rate 17, height 5\' 7"  (1.702 m), weight 77.9 kg (171 lb 11.8 oz), SpO2 95.00%. No results found. Results for orders placed during the hospital encounter of 12/22/11 (from the past 72 hour(s))  CBC     Status: Normal   Collection Time   12/23/11  6:40 AM      Component Value Range Comment   WBC 6.9  4.0 - 10.5 K/uL    RBC 4.30  4.22 - 5.81 MIL/uL    Hemoglobin 13.2  13.0 - 17.0 g/dL    HCT 40.9  81.1 - 91.4 %    MCV 92.8  78.0 - 100.0 fL    MCH 30.7  26.0 - 34.0 pg    MCHC 33.1  30.0 - 36.0 g/dL    RDW 78.2  95.6 - 21.3 %    Platelets 206  150 - 400 K/uL   DIFFERENTIAL     Status: Normal   Collection Time   12/23/11  6:40 AM      Component Value Range Comment   Neutrophils Relative 73  43 - 77 %    Neutro Abs 5.0  1.7 - 7.7 K/uL    Lymphocytes Relative 16  12 - 46 %    Lymphs Abs 1.1  0.7 - 4.0 K/uL    Monocytes Relative 10  3 - 12 %    Monocytes Absolute 0.7  0.1 - 1.0 K/uL    Eosinophils Relative 1  0 - 5 %    Eosinophils Absolute 0.1  0.0 - 0.7 K/uL    Basophils Relative 0  0 - 1 %    Basophils Absolute 0.0  0.0 - 0.1 K/uL      HEENT: masked facies Cardio: RRR Resp: CTA B/L GI: BS positive Extremity:  No Edema Skin:   Intact Neuro: Confused and Abnormal Sensory dorsum R foot Musc/Skel:  Other R leg length discrepency R foot drop  Assessment/Plan: 1. Functional deficits secondary to deconditioning following SBO, parkinson's which require 3+ hours per day of interdisciplinary therapy in a comprehensive inpatient rehab setting. Physiatrist  is providing close team supervision and 24 hour management of active medical problems listed below. Physiatrist and rehab team continue to assess barriers to discharge/monitor patient progress toward functional and medical goals. FIM:                   Comprehension Comprehension Mode: Auditory Comprehension: 5-Follows basic conversation/direction: With no assist  Expression Expression: 4-Expresses basic 75 - 89% of the time/requires cueing 10 - 24% of the time. Needs helper to occlude trach/needs to repeat words.  Social Interaction Social Interaction: 5-Interacts appropriately 90% of the time - Needs monitoring or encouragement for participation or interaction.  Problem Solving Problem Solving: 4-Solves basic 75 - 89% of the time/requires cueing 10 - 24% of the time  Memory Memory: 4-Recognizes or recalls 75 - 89% of the time/requires cueing 10 - 24% of the time Medical Problem List and Plan:  1. small bowel obstruction with deconditioning as well as Parkinson's disease  2. DVT Prophylaxis/Anticoagulation: Subcutaneous heparin for DVT prophylaxis  3. Decreased nutritional storage. Diet has slowly been advanced after small bowel obstruction  4. Parkinson's disease. Sinemet as directed and continue current medication regimen  5. Dementia. Continue Exelon patch   LOS (Days) 1 A FACE TO FACE EVALUATION WAS PERFORMED  Dore Oquin E 12/23/2011, 7:45 AM

## 2011-12-23 NOTE — Progress Notes (Addendum)
Occupational Therapy Session Note  Patient Details  Name: Paul Callahan MRN: [id number] Date of Birth: Sep 17, 1923  Today's Date: 12/23/2011 Time: 0730-0830 and 1416-1500 Time Calculation (min): 60 min and 44 min  Short Term Goals: Week 1:  OT Short Term Goal 1 (Week 1): Pt will complete bathing at sit to stand level with min assist and min-mod verbal cues for initiation and sequencing. OT Short Term Goal 2 (Week 1): Pt will complete LB dressing with mod assist 3 out of 4 times. OT Short Term Goal 3 (Week 1): Pt will complete toilet transfer with min assist with LRAD OT Short Term Goal 4 (Week 1): Pt will complete tub bench transfer with min assist  Skilled Therapeutic Interventions/Progress Updates:  Balance/vestibular training;Cognitive remediation/compensation;Discharge planning;DME/adaptive equipment instruction;Functional mobility training;Patient/family education;Self Care/advanced ADL retraining;Therapeutic Activities;Therapeutic Exercise;UE/LE Strength taining/ROM;UE/LE Coordination activities  1) OT eval initiated, followed by ADL assessment with bathing and dressing seated at EOB.  Pt required cues for initiation and sequencing with bathing and required total assist with LB bathing and dressing.  Unable to fully assess pt's routine and PLOF secondary to decreased memory and no family present.  Pt min-mod assist sit <> stand with cues for hand placement.  Pt reported slight nausea this am, but it subsided with food.  2)1:1 OT with focus on activity tolerance, BUE strengthening, and transfers.  Engaged in 10 mins on UE arm bike with 5 mins forward and 5 mins backward.  Pt required 2 rest breaks during activity.  Toilet transfer with use of 3 in 1 over toilet to assist with sit <> stand, pt uses this setup at home; mod assist with RW.  Simulated tub/shower transfer with use of tub bench, pt reports something similar at home - plan to get full info from wife.  Pt max assist (lift and lower)  out of shower and he required assist with lifting both legs over ledge.  Pt reporting increased fatigue and upset stomach as session progressed.   Therapy Documentation Precautions:  Precautions Precautions: Fall Required Braces or Orthoses: Other Brace/Splint (wears Rt AFO) Restrictions Weight Bearing Restrictions: No Pain: Pain Assessment Pain Score:   2 Faces Pain Scale: Hurts a little bit ADL: ADL Eating: Modified independent Where Assessed-Eating: Edge of bed Grooming: Moderate assistance Where Assessed-Grooming: Edge of bed Upper Body Bathing: Moderate assistance Where Assessed-Upper Body Bathing: Edge of bed Lower Body Bathing: Maximal assistance Where Assessed-Lower Body Bathing: Edge of bed Upper Body Dressing: Minimal assistance Where Assessed-Upper Body Dressing: Edge of bed Lower Body Dressing: Dependent Where Assessed-Lower Body Dressing: Edge of bed ADL Comments: Difficult to assess pt's performance PTA as pt is poor report.  Pt did have an aid who assisted with bathing 2x/week  See FIM for current functional status  Therapy/Group: Individual Therapy  Leonette Monarch 12/23/2011, 12:08 PM

## 2011-12-23 NOTE — Evaluation (Signed)
Physical Therapy Assessment and Plan  Patient Details  Name: Paul Callahan MRN: [id number] Date of Birth: 1924/01/21  PT Diagnosis: Abnormal posture, Abnormality of gait, Cognitive deficits and Muscle weakness Rehab Potential: Good ELOS: 10-12 days   Today's Date: 12/23/2011 Time: 4098-1191 Time Calculation (min): 57 min  Problem List:  Patient Active Problem List  Diagnosis  . SBO (small bowel obstruction)  . Acute kidney injury  . Thrombocytopenia  . Physical deconditioning    Past Medical History:  Past Medical History  Diagnosis Date  . Parkinson disease   . Dementia   . Depression   . Bowel obstruction 12/15/2011  . Neuromuscular disorder     hx of parkinsons  . Cancer     hx of colon cancer  . Arthritis    Past Surgical History:  Past Surgical History  Procedure Date  . Eye surgery   . Cataracts   . Hernia repair   . Back surgery     x 5    Assessment & Plan Clinical Impression: Patient is a 78 y.o. right-handed male with history of Parkinson's disease as well as colon cancer with resection and did receive CIR in 2009 after back surgery. Patient required some assistance with dressing and bathing at home but otherwise was independent using an assisted device. Patient does have a chronic right foot drop following back surgery 2009 uses an AFO.  Admitted 12/16/2011 with abdominal pain with associated nausea vomiting. X-rays and imaging revealed partial small bowel obstruction likely due to adhesions from previous partial colectomy. A nasogastric tube was placed. General surgery was consulted with conservative management. Latest followup CT of the abdomen showed partial small bowel obstruction. There is fecalization of the small bowel, proximal to the transition point indicates chronicity. His diet was to be slowly advanced after nasogastric tube removed. He was on subcutaneous heparin for DVT prophylaxis. Patient noted to have bouts of acute delirium sundowning with  noted history of dementia. Patient transferred to CIR on 12/22/2011 .   Patient currently requires max with mobility secondary to muscle weakness, muscle joint tightness and R foot drop, decreased cardiorespiratoy endurance, abnormal tone and rigidity secondary to Parkinson's, decreased initiation and decreased memory and decreased sitting balance, decreased standing balance, decreased postural control and decreased balance strategies.  Prior to hospitalization, patient was supervision with mobility and lived with Spouse in a House home.  Home access is 1Ramped entrance.  Patient will benefit from skilled PT intervention to maximize safe functional mobility, minimize fall risk and decrease caregiver burden for planned discharge home with 24 hour assist.  Anticipate patient will benefit from follow up [facility] at discharge.  PT - End of Session Activity Tolerance: Decreased this session Endurance Deficit: Yes Endurance Deficit Description: limited by deconditioning, immobility, Parkinson's, nausea PT Assessment Rehab Potential: Good PT Plan PT Frequency: 1-2 X/day, 60-90 minutes;5 out of 7 days Estimated Length of Stay: 10-12 days PT Treatment/Interventions: Ambulation/gait training;Balance/vestibular training;Discharge planning;DME/adaptive equipment instruction;Functional mobility training;Neuromuscular re-education;Patient/family education;Skin care/wound management;Splinting/orthotics;Stair training;Therapeutic Activities;Therapeutic Exercise;UE/LE Strength taining/ROM;UE/LE Coordination activities PT Recommendation Follow Up Recommendations: Home health PT;24 hour supervision/assistance Equipment Recommended:  (TBD) Equipment Details: possible SW? TBD once equipment confirmed with wife  PT Evaluation Precautions/Restrictions Precautions Precautions: Fall Precaution Comments: h/o lumbar surgery, dementia, Parkinson's dz Required Braces or Orthoses: Other Brace/Splint Other Brace/Splint: R  AFO Pain Pain Assessment Pain Assessment: No/denies pain (reports nausea, dizziness; RN notified) Pain Score:   2 Faces Pain Scale: Hurts a little bit Home Living/Prior Functioning Home Living  Lives With: Spouse Available Help at Discharge: Family;Personal care attendant Type of Home: House Home Access: Ramped entrance Home Adaptive Equipment: Walker - rolling;Wheelchair - manual Prior Function Level of Independence: Requires assistive device for independence;Needs assistance with gait;Needs assistance with tranfers Cognition Memory: Impaired Memory Impairment: Decreased recall of new information;Decreased short term memory Impaired initiation secondary to Parkinson's Sensation Sensation Light Touch: Not tested Stereognosis: Not tested Hot/Cold: Not tested Coordination Gross Motor Movements are Fluid and Coordinated: No Coordination and Movement Description: Pt with Parkinson's  Motor  Motor Motor: Abnormal tone;Abnormal postural alignment and control Motor - Skilled Clinical Observations: Parkinson's disease; increased rigidity/tone R > L; R leg length discrepency and R foot drop from previous back surgery.  Sits in R lateral flexion, forward head with R lateral flexion and L rotation  Mobility Bed Mobility Bed Mobility: Supine to Sit;Sit to Supine Sit to Supine: 1: +1 Total assist Sit to Supine - Details: Tactile cues for initiation;Tactile cues for sequencing;Verbal cues for sequencing Sit to Supine - Details (indicate cue type and reason): Total A to transition from supine > sit; patient with increased difficulty with rolling and perform side to sit secondary to increased trunk/pelvic rigidity and poor core strength Transfers Stand Pivot Transfers: 2: Max assist;1: +1 Total assist Stand Pivot Transfer Details: Tactile cues for initiation;Tactile cues for sequencing;Tactile cues for weight shifting;Verbal cues for sequencing Stand Pivot Transfer Details (indicate cue type  and reason): Stand pivot with max-total A with bilat UE support and cues for lateral weight shifts and for stepping and pivoting sequence and for lifting and lowering assistance Locomotion  Ambulation Ambulation/Gait Assistance: 1: +1 Total assist;3: Mod assist Ambulation Distance (Feet): 50 Feet Assistive device: Standard walker Ambulation/Gait Assistance Details: Total A without AD; with SW patient required mod A for gait with verbal cues for safe stepping sequence with SW and assistance for RLE advancement, placement and stability secondary to lack of R heel strike and inability to extend R hip and knee; also presents with RLE ADD and step to gait pattern, flexed posture and decreased gait speed Stairs / Additional Locomotion Stairs: No Wheelchair Mobility Wheelchair Mobility: Yes Wheelchair Assistance: 1: +1 Total assist Wheelchair Assistance Details: Tactile cues for initiation;Tactile cues for sequencing;Verbal cues for sequencing;Verbal cues for technique;Verbal cues for precautions/safety Wheelchair Propulsion: Both upper extremities Wheelchair Parts Management: Needs assistance Distance: 29' with bilat UE with total verbal and hand over hand facilitation for simultaneous UE propulsion to prevent steering to R into wall  Trunk/Postural Assessment  Cervical Assessment Cervical Assessment: Exceptions to [facility] (forward head; rests in flexion, R lat flexion, L rotation) Thoracic Assessment Thoracic Assessment: Exceptions to [facility] (thoracic kyphosis) Lumbar Assessment Lumbar Assessment: Exceptions to [facility] (decreased lordosis, h/o back surgery, absent rotation) Postural Control Postural Control: Deficits on evaluation (rigidity, limited ROM trunk and pelvis for weight shifts)  Balance  Max-total A in standing with UE support; need to asses further as patient activity tolerance improves Extremity Assessment  RLE Assessment RLE Assessment: Exceptions to [facility] RLE Strength RLE Overall  Strength: Deficits;Due to premorbid status RLE Overall Strength Comments: h/o low back surgery with R foot drop, R leg length discrepency, decreased knee and hip extension ROM; 2-3/5 overall LLE Assessment LLE Assessment: Exceptions to [facility] LLE Strength LLE Overall Strength: Deficits LLE Overall Strength Comments: Deconditioned; 4-/5 overall  See FIM for current functional status Refer to Care Plan for Long Term Goals  Recommendations for other services: None  Discharge Criteria: Patient will be discharged from PT  if patient refuses treatment 3 consecutive times without medical reason, if treatment goals not met, if there is a change in medical status, if patient makes no progress towards goals or if patient is discharged from hospital.  The above assessment, treatment plan, treatment alternatives and goals were discussed and mutually agreed upon: by patient  [name] 12/23/2011, 1:13 PM

## 2011-12-23 NOTE — Progress Notes (Signed)
Physical Therapy Note  Patient Details  Name: Paul Callahan MRN: [id number] Date of Birth: 1924-06-07 Today's Date: 12/23/2011  Time: 1345-1414 29 minutes  No c/o pain.  Supine NMR for lower trunk and core mm activation and control.  Pt with significant weakness in all abdominal musculature.  Required manual facilitation for LTR and bridging exercises.  PROM B LEs with noted tone in R hamstring and L internal rotators, this PT unable to break tone.  Gait training with SW 50' with min A, manual facilitation for wt shifting.  Pt with decreased step and stride length, step to pattern with R stepping to L, narrow BOS.  Gait training sideways and backwards with mod A sideways, mod A backwards with 1 LOB with stepping backwards requiring total A to correct.  Pt with decreased balance reactions.  Individual therapy   Nestor Wieneke 12/23/2011, 2:15 PM

## 2011-12-23 NOTE — Progress Notes (Signed)
[facility] Individual Statement of Services  Patient Name:  Paul Callahan  Date:  12/23/2011  Welcome to the [facility]patient [facility].  Our goal is to provide you with an individualized program based on your diagnosis and situation, designed to meet your specific needs.  With this comprehensive rehabilitation program, you will be expected to participate in at least 3 hours of rehabilitation therapies Monday-Friday, with modified therapy programming on the weekends.  Your rehabilitation program will include the following services:  Physical Therapy (PT), Occupational Therapy (OT), 24 hour per day rehabilitation nursing, Therapeutic Recreaction (TR), Case Management (RN and Child psychotherapist), Rehabilitation Medicine, Nutrition Services and Pharmacy Services  Weekly team conferences will be held on Wednesdays to discuss your progress.  Your RN Case Designer, television/film set will talk with you frequently to get your input and to update you on team discussions.  Team conferences with you and your family in attendance may also be held.  Expected length of stay: 7-10 days  Overall anticipated outcome: Minimum assistance  Depending on your progress and recovery, your program may change.  Your RN Case Estate agent will coordinate services and will keep you informed of any changes.  Your RN Sports coach and SW names and contact numbers are listed  below.  The following services may also be recommended but are not provided by the [facility]patient [facility]:   Driving Evaluations  Home Health Rehabiltiation Services  Outpatient Rehabilitatation [facility]  Vocational Rehabilitation   Arrangements will be made to provide these services after discharge if needed.  Arrangements include referral to agencies that provide these services.  Your insurance has been verified to be:  Medicare + Primary Physician Care Your primary doctor is:  Dr Shary Decamp  Pertinent information will be shared with your doctor and your insurance company.  Case Manager: Lutricia Horsfall, [facility] [phone]  Social Worker:  Dossie Der, [place] [phone]  Information discussed with and copy given to patient by: Meryl Dare, 12/23/2011

## 2011-12-23 NOTE — Evaluation (Signed)
Occupational Therapy Assessment and Plan  Patient Details  Name: Paul Callahan MRN: [id number] Date of Birth: 1923-09-29  OT Diagnosis: altered mental status and muscle weakness (generalized) Rehab Potential: Rehab Potential: Good ELOS: 10-14 days   Today's Date: 12/23/2011 Time: 0730-0830 Time Calculation (min): 60 min  Problem List:  Patient Active Problem List  Diagnosis  . SBO (small bowel obstruction)  . Acute kidney injury  . Thrombocytopenia  . Physical deconditioning    Past Medical History:  Past Medical History  Diagnosis Date  . Parkinson disease   . Dementia   . Depression   . Bowel obstruction 12/15/2011  . Neuromuscular disorder     hx of parkinsons  . Cancer     hx of colon cancer  . Arthritis    Past Surgical History:  Past Surgical History  Procedure Date  . Eye surgery   . Cataracts   . Hernia repair   . Back surgery     x 5    Assessment & Plan Clinical Impression: Patient is a 78 y.o. year old male with recent admission to the hospital on 12/16/2011 with abdominal pain with associated nausea vomiting. X-rays and imaging revealed partial small bowel obstruction likely due to adhesions from previous partial colectomy. A nasogastric tube was placed. General surgery was consulted with conservative management. Latest followup CT of the abdomen showed partial small bowel obstruction. There is fecalization of the small bowel, proximal to the transition point indicates chronicity. There is improved gas pattern. Patient maintained on IV fluids for necessary hydration. His diet was to be slowly advanced after nasogastric tube removed. He was on subcutaneous heparin for DVT prophylaxis. He continues on his current regimen for Parkinson's disease. Patient noted to have bouts of acute delirium sundowning with noted history of dementia. History of Parkinson's disease as well as colon cancer with resection and did receive CIR in 2009 after back surgery. Patient  transferred to CIR on 12/22/2011 .    Patient currently requires total assist LB and mod assist UB, mod assist transfers with basic self-care skills secondary to muscle weakness, impaired timing and sequencing and decreased motor planning, decreased initiation and decreased standing balance.  Prior to hospitalization, patient could complete BADL with mod assist. Patient required some assistance with dressing and bathing at home but otherwise was independent using an assisted device. Patient does have a chronic right foot drop following back surgery 2009 uses an AFO.   Patient will benefit from skilled intervention to decrease level of assist with basic self-care skills prior to discharge home with care partner.  Anticipate patient will require 24 hour supervision and minimal physical assistance and follow up home health.  OT - End of Session Activity Tolerance: Tolerates 30+ min activity without fatigue OT Assessment Rehab Potential: Good OT Plan OT Frequency: 1-2 X/day, 60-90 minutes Estimated Length of Stay: 10-14 days OT Treatment/Interventions: Balance/vestibular training;Cognitive remediation/compensation;Discharge planning;DME/adaptive equipment instruction;Functional mobility training;Patient/family education;Self Care/advanced ADL retraining;Therapeutic Activities;Therapeutic Exercise;UE/LE Strength taining/ROM;UE/LE Coordination activities OT Recommendation Follow Up Recommendations: Home health OT Equipment Recommended:  (TBD) Equipment Details: Pt reports having some DME, plan to follow up with wife regarding what exactly he has  OT Evaluation Precautions/Restrictions  Restrictions Weight Bearing Restrictions: No General   Vital Signs   Pain Pain Assessment Pain Score:   2 Faces Pain Scale: Hurts a little bit Home Living/Prior Functioning Home Living Lives With: Spouse Available Help at Discharge: Family;Personal care attendant (personal care attendant 2x/wk assist with  bathing/dressing) Type of  Home: House Home Access: Stairs to enter [employer] of Steps: 1 Home Layout: Two level;Able to live on main level with bedroom/bathroom Bathroom Shower/Tub: Walk-in shower;Door [employer]:  (3 in 1 over toilet) Home Adaptive Equipment: Bedside commode/3-in-1;Walker - four wheeled;Grab bars in shower;Tub transfer bench Prior Function Level of Independence: Requires assistive device for independence;Needs assistance with ADLs Dressing: Moderate Meal Prep: Total Light Housekeeping: Total Able to Take Stairs?: Yes Driving: No Vocation: Retired ADL ADL Eating: Modified independent Where Assessed-Eating: Edge of bed Grooming: Moderate assistance Where Assessed-Grooming: Edge of bed Upper Body Bathing: Moderate assistance Where Assessed-Upper Body Bathing: Edge of bed Lower Body Bathing: Maximal assistance Where Assessed-Lower Body Bathing: Edge of bed Upper Body Dressing: Minimal assistance Where Assessed-Upper Body Dressing: Edge of bed Lower Body Dressing: Dependent Where Assessed-Lower Body Dressing: Edge of bed ADL Comments: Difficult to assess pt's performance PTA as pt is poor report.  Pt did have an aid who assisted with bathing 2x/week Vision/Perception  Vision - Assessment Eye Alignment: Within Functional Limits Vision Assessment: Vision tested Ocular Range of Motion: Impaired-to be further tested in functional context Tracking/Visual Pursuits: Requires cues, head turns, or add eye shifts to track (undershooting) Additional Comments: Pt tends to keep head down, but able to track/scan when prompted  Cognition Arousal/Alertness: Awake/alert Orientation Level: Oriented to person Memory: Impaired Memory Impairment: Decreased recall of new information;Decreased short term memory Sensation Sensation Light Touch: Appears Intact Proprioception: Appears Intact Coordination Gross Motor Movements are Fluid and Coordinated: No Fine  Motor Movements are Fluid and Coordinated: No Coordination and Movement Description: Pt with Parkinson's Extremity/Trunk Assessment RUE Assessment RUE Assessment: Exceptions to [facility] RUE AROM (degrees) Overall AROM Right Upper Extremity: Due to premorbid status (Shoulder AROM approx 70-80 degrees) RUE Strength RUE Overall Strength: Within Functional Limits for tasks performed (grossly 4/5) LUE Assessment LUE Assessment: Exceptions to [facility] LUE AROM (degrees) Overall AROM Left Upper Extremity: Due to premorbid status (shoulder AROM 70-80) LUE Strength LUE Overall Strength: Within Functional Limits for tasks assessed (grossly 4/5)  See FIM for current functional status Refer to Care Plan for Long Term Goals  Recommendations for other services: None  Discharge Criteria: Patient will be discharged from OT if patient refuses treatment 3 consecutive times without medical reason, if treatment goals not met, if there is a change in medical status, if patient makes no progress towards goals or if patient is discharged from hospital.  The above assessment, treatment plan, treatment alternatives and goals were discussed and mutually agreed upon: by patient  Leonette Monarch 12/23/2011, 11:58 AM

## 2011-12-23 NOTE — Plan of Care (Signed)
Problem: RH Pre-functional (Specify) Goal: RH LTG Pre-functional (Specify) Patient will ambulate up and down ramp with LRAD for home entry and exit with min A

## 2011-12-24 NOTE — Progress Notes (Signed)
Physical Therapy Session Note  Patient Details  Name: Paul Callahan MRN: [id number] Date of Birth: 1924-01-01  Today's Date: 12/24/2011 Time: 1455-1530 Time Calculation (min): 35 min  Short Term Goals: Week 1:  PT Short Term Goal 1 (Week 1): = LTG  Skilled Therapeutic Interventions/Progress Updates: Used pt's personal rollator for gait this PM with improved ease of movement and management of walker with obstacle negotiation - gait to/from therapy with min A for improved activity tolerance and strengthening with cueing for postural alignment and increased stride length. Continued PNF diagonal exercises from AM session for improved dissociation with trunk and pelvis x 10 reps each side. Functional strengthening with sit to stands (cueing for pt not to use back of legs to maintain balance in standing) during therapeutic task to place horseshoes on rim of basketball hoop to work on upright posture and standing balance without AD. Stair training for functional strengthening and coordination up/down 5 steps with min A. Seated exercises per pt request to practice for strength: LAQs and marches x 10 reps each LE x 2 sets. Pt decided to sit up in recliner upon return to room with wife present.     Therapy Documentation Precautions:  Precautions Precautions: Fall Precaution Comments: h/o lumbar surgery, dementia, Parkinson's dz Required Braces or Orthoses: Other Brace/Splint Other Brace/Splint: R AFO Restrictions Weight Bearing Restrictions: No Pain:  No complaints.   Locomotion : Ambulation Ambulation/Gait Assistance: 4: Min assist   See FIM for current functional status  Therapy/Group: Individual Therapy  Karolee Stamps [facility] 12/24/2011, 3:53 PM

## 2011-12-24 NOTE — Progress Notes (Signed)
Occupational Therapy Session Note  Patient Details  Name: Paul Callahan MRN: [id number] Date of Birth: 02/17/1924  Today's Date: 12/24/2011  Session 1  Time: 0800-0859 Time Calculation (min): 59 min  Short Term Goals: Week 1:  OT Short Term Goal 1 (Week 1): Pt will complete bathing at sit to stand level with min assist and min-mod verbal cues for initiation and sequencing. OT Short Term Goal 2 (Week 1): Pt will complete LB dressing with mod assist 3 out of 4 times. OT Short Term Goal 3 (Week 1): Pt will complete toilet transfer with min assist with LRAD OT Short Term Goal 4 (Week 1): Pt will complete tub bench transfer with min assist  Skilled Therapeutic Interventions: ADL-retraining at walk-in shower with emphasis on functional mobility, endurance, sitting and standing dynamic balance, transfers, initiation, sequencing, and safety awareness.   Patient able to complete ADL with overall moderate assist this session.    Therapy Documentation Precautions:  Precautions Precautions: Fall Precaution Comments: h/o lumbar surgery, dementia, Parkinson's dz Required Braces or Orthoses: Other Brace/Splint Other Brace/Splint: R AFO Restrictions Weight Bearing Restrictions: No  Pain:    See FIM for current functional status  Therapy/Group: Individual Therapy  Second session: Time: 12:58-13:43 Time Calculation (min):  45 min  Pain Assessment: No pain  Skilled Therapeutic Interventions: Therapeutic activity with emphasis on functional mobility using 4-wheeled walker w/seat, endurance, dynamic standing balance, and general conditioning.   Patient requested to urinate standing at toilet and was able to manage clothing with only min assist to pull up pants after urinating.   Patient uses his 4-wheeled walker skillfully with close supervision but demo'd no loss of balance or abnormal gait during walk from his room to rehab gym.   Patient then completed 12:30 of light U/LE conditioning using  NuStep, set to level 3, achieving max rate of 30 watts at end of session.   Patient became emotionally labile (tearful) as his wife entered room but recovered w/o need for assist or interruption to treatment.   Patient commented later, "I wish I could control that (emotional responses)."  See FIM for current functional status  Therapy: Individual Therapy   Georgeanne Nim 12/24/2011, 3:40 PM

## 2011-12-24 NOTE — Progress Notes (Signed)
Patient ID: Paul Callahan, male   DOB: 1923-11-19, 78 y.o.   MRN: [id number] SBO (small bowel obstruction)  Subjective: Alert this am, no reports of any NV, or other complaints today. Eating regular diet.  Having BMs.    Objective: Vital signs in last 24 hours: Temp:  [99 F (37.2 C)-100.5 F (38.1 C)] 99 F (37.2 C) (06/17 0549) Pulse Rate:  [62-76] 76  (06/17 0549) Resp:  [20-25] 20  (06/17 0549) BP: (147-165)/(67-70) 165/70 mmHg (06/17 0549) SpO2:  [92 %-95 %] 95 % (06/17 0549) Last BM Date: 12/18/11  Intake/Output from previous day: 06/16 0701 - 06/17 0700 In: 2033 [I.V.:2033] Out: 1650 [Urine:1450; Emesis/NG output:200] Intake/Output this shift:    General appearance:Awake, alert, orientated, no distress, on room air. Resp: clear to auscultation bilaterally GI: Soft, + BS,BM, no tenderness, no bloating.  Lab Results:  No results found for this or any previous visit (from the past 24 hour(s)).   Studies/Results Radiology     MEDS, Scheduled    . carbidopa-levodopa  1 tablet Oral QHS  . carbidopa-levodopa  3 tablet Oral TID WC  . DULoxetine  60 mg Oral Daily  . heparin  5,000 Units Subcutaneous Q8H  . rivastigmine  9.5 mg Transdermal Daily     Assessment: SBO (small bowel obstruction)  Plan: Sign off. Call PRN.     LOS: 3 days

## 2011-12-24 NOTE — Progress Notes (Signed)
Patient ID: Paul Callahan, male   DOB: Jun 13, 1924, 78 y.o.   MRN: [id number]  Subjective/Complaints: No specific complaints. Review of Systems  Neurological: Positive for focal weakness.  Psychiatric/Behavioral: Positive for memory loss.  Objective: Vital Signs: Blood pressure 130/76, pulse 70, temperature 99.3 F (37.4 C), temperature source Oral, resp. rate 16, height 5\' 7"  (1.702 m), weight 171 lb 11.8 oz (77.9 kg), SpO2 96.00%.  Elderly male in no acute distress. Chest without increased work of breathing. Cardiac exam S1-S2 are regular. Extremities no edema.  Assessment/Plan: 1. Functional deficits secondary to deconditioning following SBO, parkinson's which require 3+ hours per day of interdisciplinary therapy in a comprehensive inpatient rehab setting. Physiatrist is providing close team supervision and 24 hour management of active medical problems listed below. Physiatrist and rehab team continue to assess barriers to discharge/monitor patient progress toward functional and medical goals. FIM: FIM - Bathing Bathing Steps Patient Completed: Chest;Abdomen;Right upper leg;Left upper leg Bathing: 2: Max-Patient completes 3-4 76f 10 parts or 25-49%  FIM - Upper Body Dressing/Undressing Upper body dressing/undressing steps patient completed: Thread/unthread right sleeve of pullover shirt/dresss;Thread/unthread left sleeve of pullover shirt/dress;Put head through opening of pull over shirt/dress Upper body dressing/undressing: 4: Min-Patient completed 75 plus % of tasks FIM - Lower Body Dressing/Undressing Lower body dressing/undressing steps patient completed: Pull pants up/down Lower body dressing/undressing: 1: Total-Patient completed less than 25% of tasks     FIM - Diplomatic Services operational officer Devices: Elevated toilet seat;Grab bars;Walker Toilet Transfers: 3-To toilet/BSC: Mod A (lift or lower assist);3-From toilet/BSC: Mod A (lift or lower assist)  FIM -  Bed/Chair Transfer Bed/Chair Transfer: 1: Supine > Sit: Total A (helper does all/Pt. < 25%);1: Sit > Supine: Total A (helper does all/Pt. < 25%);2: Bed > Chair or W/C: Max A (lift and lower assist);2: Chair or W/C > Bed: Max A (lift and lower assist)  FIM - Locomotion: Wheelchair Distance: 15' with bilat UE with total verbal and hand over hand facilitation for simultaneous UE propulsion to prevent steering to R into wall Locomotion: Wheelchair: 1: Total Assistance/staff pushes wheelchair (Pt<25%) FIM - Locomotion: Ambulation Locomotion: Ambulation Assistive Devices: Environmental consultant - Standard Ambulation/Gait Assistance: 1: +1 Total assist;3: Mod assist Locomotion: Ambulation: 1: Travels less than 50 ft with total assistance/helper does all (Pt.<25%) (total A without AD; with SW mod A)  Comprehension Comprehension Mode: Auditory Comprehension: 5-Understands basic 90% of the time/requires cueing < 10% of the time  Expression Expression Mode: Verbal Expression: 4-Expresses basic 75 - 89% of the time/requires cueing 10 - 24% of the time. Needs helper to occlude trach/needs to repeat words.  Social Interaction Social Interaction: 4-Interacts appropriately 75 - 89% of the time - Needs redirection for appropriate language or to initiate interaction.  Problem Solving Problem Solving: 4-Solves basic 75 - 89% of the time/requires cueing 10 - 24% of the time  Memory Memory: 4-Recognizes or recalls 75 - 89% of the time/requires cueing 10 - 24% of the time Medical Problem List and Plan:  1. small bowel obstruction with deconditioning as well as Parkinson's disease  2. DVT Prophylaxis/Anticoagulation: Subcutaneous heparin for DVT prophylaxis  3. Decreased nutritional storage. Diet has slowly been advanced after small bowel obstruction - note albumin of 3.2. 4. Parkinson's disease. Sinemet as directed and continue current medication regimen  5. Dementia. Continue Exelon patch   LOS (Days) 2 A FACE TO  FACE EVALUATION WAS PERFORMED  Sharleen Szczesny HENRY 12/24/2011, 5:41 AM

## 2011-12-24 NOTE — Progress Notes (Signed)
Physical Therapy Session Note  Patient Details  Name: Paul Callahan MRN: [id number] Date of Birth: October 22, 1923  Today's Date: 12/24/2011 Time: 1000-1043 Time Calculation (min): 43 min  Short Term Goals: Week 1:  PT Short Term Goal 1 (Week 1): = LTG  Skilled Therapeutic Interventions/Progress Updates: Worked on w/c propulsion for general endurance and strengthening as well as coordination; pt required max cueing for technique and to continue activity with hand over hand demonstration for hand placement x 10' reporting fatigue. Transfer training with min A with SW to/from mat with cueing for safety with SW and hand placement to control descent with stand to sit. Initiated PNF diagonals with blue ball for trunk rotation, general strengthening, and dissociation between trunk/pelvis to aid in mobility; after 5 reps pt stops due to fatigue and begins to become tearful and crying. Pt reports he feels "sick" and is apologetic, stating he just doesn't feel good. Therapist providing emotional support and encouragement with pt's progress offering alternative options to work on in therapy but pt declines stating he only wants to return to his room. Other therapist in gym, who has worked with patient before, states he has days like this where he becomes upset/tearful limiting participation, and it is often due to changes in his medication. Min A short distance gait back to to w/c to return to room.   In pt room, pt agreeable to work on balance and standing to help clear items off of his bed prior to returning to bed to rest but once moving around and near bed, pt sat down and laid down stating he "just can't do it. I don't feel good." Assisted pt with proper positioning in the bed and pt able to bridge to reposition hips with cueing. Notified RN of pt's status and of other therapist's information. Missed 17 minutes of skilled PT.     Therapy Documentation Precautions:  Precautions Precautions: Fall Precaution  Comments: h/o lumbar surgery, dementia, Parkinson's dz Required Braces or Orthoses: Other Brace/Splint Other Brace/Splint: R AFO Restrictions Weight Bearing Restrictions: No General: Amount of Missed PT Time (min): 17 Minutes Missed Time Reason: Patient unwilling/refused to participate without medical reason;Patient fatigue (see note)   Pain: Pain Assessment Pain Assessment: No/denies pain   See FIM for current functional status  Therapy/Group: Individual Therapy  Karolee Stamps [facility] 12/24/2011, 10:48 AM

## 2011-12-25 NOTE — Progress Notes (Signed)
Pt's wife has brought in pts spinal stimulator for use on pt. Pt has had a few moments today of emotional distress and was tearful and crying. Emotional support given to pt and family. Pt ate his lunch and breakfast well but did not like eating much for supper. Pt and families concerns addressed. Will continue with plan of care.    Delight Hoh RN

## 2011-12-25 NOTE — Progress Notes (Signed)
Patient ID: Paul Callahan, male   DOB: 1923/10/27, 78 y.o.   MRN: [id number]  Subjective/Complaints: No  Complaints., states he is feeling better Review of Systems  Neurological: Positive for focal weakness.  Psychiatric/Behavioral: Positive for memory loss.  Objective: Vital Signs: Blood pressure 160/78, pulse 71, temperature 97.8 F (36.6 C), temperature source Oral, resp. rate 16, height 5\' 7"  (1.702 m), weight 171 lb 11.8 oz (77.9 kg), SpO2 94.00%.  Elderly male in no acute distress. Chest without increased work of breathing. Cardiac exam S1-S2 are regular. Extremities no edema.  Assessment/Plan: 1. Functional deficits secondary to deconditioning following SBO, parkinson's which require 3+ hours per day of interdisciplinary therapy in a comprehensive inpatient rehab setting. Physiatrist is providing close team supervision and 24 hour management of active medical problems listed below. Physiatrist and rehab team continue to assess barriers to discharge/monitor patient progress toward functional and medical goals. FIM: FIM - Bathing Bathing Steps Patient Completed: Chest;Right Arm;Left Arm;Abdomen;Front perineal area;Right upper leg;Left upper leg Bathing: 3: Mod-Patient completes 5-7 59f 10 parts or 50-74%  FIM - Upper Body Dressing/Undressing Upper body dressing/undressing steps patient completed: Thread/unthread right sleeve of pullover shirt/dresss;Thread/unthread left sleeve of pullover shirt/dress;Put head through opening of pull over shirt/dress Upper body dressing/undressing: 2: Max-Patient completed 25-49% of tasks FIM - Lower Body Dressing/Undressing Lower body dressing/undressing steps patient completed: Pull underwear up/down;Pull pants up/down Lower body dressing/undressing: 1: Total-Patient completed less than 25% of tasks     FIM - Diplomatic Services operational officer Devices: Elevated toilet seat;Grab bars;Walker Toilet Transfers: 3-To toilet/BSC: Mod A (lift  or lower assist);3-From toilet/BSC: Mod A (lift or lower assist)  FIM - Bed/Chair Transfer Bed/Chair Transfer Assistive Devices: Manufacturing systems engineer Transfer: 3: Supine > Sit: Mod A (lifting assist/Pt. 50-74%/lift 2 legs;4: Bed > Chair or W/C: Min A (steadying Pt. > 75%)  FIM - Locomotion: Wheelchair Distance: 15' with bilat UE with total verbal and hand over hand facilitation for simultaneous UE propulsion to prevent steering to R into wall Locomotion: Wheelchair: 1: Travels less than 50 ft with maximal assistance (Pt: 25 - 49%) FIM - Locomotion: Ambulation Locomotion: Ambulation Assistive Devices: Other (comment) (rollator) Ambulation/Gait Assistance: 4: Min assist Locomotion: Ambulation: 4: Travels 150 ft or more with minimal assistance (Pt.>75%)  Comprehension Comprehension Mode: Auditory Comprehension: 5-Follows basic conversation/direction: With extra time/assistive device  Expression Expression Mode: Verbal Expression: 3-Expresses basic 50 - 74% of the time/requires cueing 25 - 50% of the time. Needs to repeat parts of sentences.  Social Interaction Social Interaction: 4-Interacts appropriately 75 - 89% of the time - Needs redirection for appropriate language or to initiate interaction.  Problem Solving Problem Solving: 3-Solves basic 50 - 74% of the time/requires cueing 25 - 49% of the time  Memory Memory: 4-Recognizes or recalls 75 - 89% of the time/requires cueing 10 - 24% of the time Medical Problem List and Plan:  1. small bowel obstruction with deconditioning as well as Parkinson's disease  2. DVT Prophylaxis/Anticoagulation: Subcutaneous heparin for DVT prophylaxis  3. Decreased nutritional storage. Diet has slowly been advanced after small bowel obstruction - note albumin of 3.2. 4. Parkinson's disease. Sinemet as directed and continue current medication regimen  5. Dementia. Continue Exelon patch   LOS (Days) 3 A FACE TO FACE EVALUATION WAS  PERFORMED  Colby Catanese HENRY 12/25/2011, 9:12 AM

## 2011-12-25 NOTE — Progress Notes (Signed)
Physical Therapy Note  Patient Details  Name: Paul Callahan MRN: [id number] Date of Birth: 02/14/1924 Today's Date: 12/25/2011  11:20-12:05 individual therapy pt denied pain.  Gait into bathroom minguard assist with rollator walker. Pt was continent of urine. Performed gait backwards with rolltor min assist with narrow BOS, sit to supine supervision. Pt unable to disassociate UE and LEs. Performed UE ROM with stick for shoulder flexion, cervical ROM, upper and lower trunk rotation AAROM without great response. Pt states he is unable to lay prone due to previous back surgeries. Pt with significant nystagmus with rolling to left that lasted >1 minutes and caused 10/10 dizziness. Performed dynamic balance in standing including squatting and rotating across mid line. This also caused dizziness with nausea.    Julian Reil 12/25/2011, 12:24 PM

## 2011-12-26 NOTE — Progress Notes (Signed)
Physical Therapy Session Note  Patient Details  Name: Paul Callahan MRN: [id number] Date of Birth: 10-18-1923  Today's Date: 12/26/2011 Time: 1400-1504 Time Calculation (min): 64 min  Short Term Goals: Week 1:  PT Short Term Goal 1 (Week 1): = LTG  Therapy Documentation Precautions:  Precautions Precautions: Fall Precaution Comments: h/o lumbar surgery, dementia, Parkinson's dz Required Braces or Orthoses: Other Brace/Splint Other Brace/Splint: R AFO Restrictions Weight Bearing Restrictions: No Vital Signs: BP 152/80 prior to therapy; patient reporting feeling better, dizziness resolving Pain:  no c/o pain Mobility:  Patient performing sit <> stands with mod A to maintain LLE ER, ABD and foot flat on floor underneath COG, for safe hand placement and full anterior lean to bring COG over BOS during sit <> stand and stand pivot with RW with mod A to maintain safe distance to RW and to turn fully with RW; sit > supine with mod A to bring LE into bed and reposition in bed. Locomotion : Ambulation Ambulation/Gait Assistance: 3: Mod assist with rollator x 100' with mod A secondary to trunk flexion and COG too far forward to BOS with decreased step and stride length; cues for upright posture and for full step and stride length to maintain safe distance to RW  Other Treatments:  Wife present; confirmed D/C details with wife and previous level of function; wife confirms 2 level home with ramp to enter and that patient was using Rollator for home and community ambulation with supervision and that patient had participated in Neuro outpatient PT for Parkinson's with excellent progress and was going to exercise classes at [facility].  Reports that patient would perform bed mobility, bed <> toilet and toileting tasks mod I and required assistance for drying off and dressing after a stand up shower.    NMR: Attempted RUE PNF D2 flexion <> extension in standing with LUE support reaching down and across  midline with a squat and manual facilitation to maintain closed chain RLE ER and foot flat on floor to reach for cones but unable to maintain balance and experienced a forward LOB.  Returned to sitting and performed trunk and RUE PNF D2 flexion <> extension reaching for same cones across midline with facilitation for trunk rotation and RLE ER, ABD, foot flat and full anterior lean from hips to place cones on table in front of him.  Fatigued quickly and required multiple rest breaks.  See FIM for current functional status  Therapy/Group: Individual Therapy  Edman Circle [facility] 12/26/2011, 4:03 PM

## 2011-12-26 NOTE — Progress Notes (Signed)
Physical Therapy Session Note  Patient Details  Name: Paul Callahan MRN: [id number] Date of Birth: 1923-07-22  Today's Date: 12/26/2011 Time: 4098-1191 Time Calculation (min): 55 min  Short Term Goals: Week 1:  PT Short Term Goal 1 (Week 1): = LTG  Skilled Therapeutic Interventions/Progress Updates:    Session limited by pt with lightheadedness. Sidelying BP = 121/59, sitting 116/59. Pt with c/o room going dark and nausea, RN called to room. With return to supine pt's BP (taken by nurse) returned to 150's over 70's. Applied thigh high TED hose. Rest of session focused on bed mobility however sitting EOB Pt's BP again = 121/73 and pt reports feeling better however still felt lightheaded. Able to tolerate Bil. UE reaching in sitting working on trunk dissociation. Pt very fatigued and desired to return to bed and sleep until next session. RN to monitor pt.   Unable to initiate vestibular exam as pt unable to follow commands appropriately for central testing in sitting due to lightheadedness. Will complete as pt able.   Therapy Documentation Precautions:  Precautions Precautions: Fall Precaution Comments: h/o lumbar surgery, dementia, Parkinson's dz Required Braces or Orthoses: Other Brace/Splint Other Brace/Splint: R AFO Restrictions Weight Bearing Restrictions: No Vital Signs: Therapy Vitals Temp: 98.3 F (36.8 C) Temp src: Oral Pulse Rate: 72  Resp: 18  BP: 158/78 mmHg Patient Position, if appropriate: Sitting Oxygen Therapy SpO2: 95 % O2 Device: None (Room air)  See FIM for current functional status  Therapy/Group: Individual Therapy  Wilhemina Bonito 12/26/2011, 9:11 AM

## 2011-12-26 NOTE — Progress Notes (Addendum)
B/P @0830  152/78, Pt bathed with OT. Called to room by PT, pt complaining of dizziness and light headiness, pt returned to bed laying flat, TEDS placed, B/P 110/78 @0845 . Blood pressue rechecked @ 0900 152/74, pt stated he felt better, dizziness subsided. Dr. Wynn Banker notified

## 2011-12-26 NOTE — Progress Notes (Signed)
Physical Therapy Note  Patient Details  Name: LEONIDAS BOATENG MRN: [id number] Date of Birth: 1923/10/13 Today's Date: 12/26/2011  Time: 1130-1200 30 minutes  No c/o pain.  Pt with increased confusion today.  Questioning where he was, where wife was, what day/time it was.  Pt performed gait with personal rollator 2 x 150' with mod A for RW control, posture and safety.  Pt with forward flexed posture, leaning on rollator and pushing rollator away making gait unsafe, requiring assist to control RW.  Nustep x 10 minutes for UE/LE reciprocal motion and AAROM with pt c/o fatigue but no pain.  Pt limited by decreased safety awareness and increased confusion this session.  Individual therapy   Broghan Pannone 12/26/2011, 12:20 PM

## 2011-12-26 NOTE — Progress Notes (Signed)
Patient ID: Paul Callahan, male   DOB: 07/01/1924, 78 y.o.   MRN: [id number]  Subjective/Complaints: Symptomatic drop of 40 mmHg systolic this am, has had this problem in the past.  Meds reviewed Was dizzy, no CP or SOB Review of Systems  Neurological: Positive for focal weakness.  Psychiatric/Behavioral: Positive for memory loss.  All other systems reviewed and are negative.  Objective: Vital Signs: Blood pressure 110/78, pulse 72, temperature 98.3 F (36.8 C), temperature source Oral, resp. rate 18, height 5\' 7"  (1.702 m), weight 77.9 kg (171 lb 11.8 oz), SpO2 95.00%. No results found. No results found for this or any previous visit (from the past 72 hour(s)).   HEENT: masked facies Cardio: RRR Resp: CTA B/L GI: BS positive Extremity:  No Edema Skin:   Intact Neuro: Confused and Abnormal Sensory dorsum R foot Musc/Skel:  Other R leg length discrepency R foot drop  Assessment/Plan: 1. Functional deficits secondary to deconditioning following SBO, parkinson's which require 3+ hours per day of interdisciplinary therapy in a comprehensive inpatient rehab setting. Physiatrist is providing close team supervision and 24 hour management of active medical problems listed below. Physiatrist and rehab team continue to assess barriers to discharge/monitor patient progress toward functional and medical goals. FIM: FIM - Bathing Bathing Steps Patient Completed: Chest;Right Arm;Left Arm;Abdomen;Front perineal area;Buttocks Bathing: 3: Mod-Patient completes 5-7 64f 10 parts or 50-74%  FIM - Upper Body Dressing/Undressing Upper body dressing/undressing steps patient completed: Thread/unthread right sleeve of pullover shirt/dresss;Thread/unthread left sleeve of pullover shirt/dress Upper body dressing/undressing: 3: Mod-Patient completed 50-74% of tasks FIM - Lower Body Dressing/Undressing Lower body dressing/undressing steps patient completed: Pull underwear up/down;Pull pants up/down Lower  body dressing/undressing: 1: Total-Patient completed less than 25% of tasks     FIM - Diplomatic Services operational officer Devices: Elevated toilet seat;Grab bars;Walker Toilet Transfers: 3-To toilet/BSC: Mod A (lift or lower assist);3-From toilet/BSC: Mod A (lift or lower assist)  FIM - Bed/Chair Transfer Bed/Chair Transfer Assistive Devices: Walker Bed/Chair Transfer: 3: Sit > Supine: Mod A (lifting assist/Pt. 50-74%/lift 2 legs);4: Bed > Chair or W/C: Min A (steadying Pt. > 75%);4: Chair or W/C > Bed: Min A (steadying Pt. > 75%)  FIM - Locomotion: Wheelchair Distance: 15' with bilat UE with total verbal and hand over hand facilitation for simultaneous UE propulsion to prevent steering to R into wall Locomotion: Wheelchair: 0: Activity did not occur FIM - Locomotion: Ambulation Locomotion: Ambulation Assistive Devices:  (rollator) Ambulation/Gait Assistance: 4: Min assist Locomotion: Ambulation: 4: Travels 150 ft or more with minimal assistance (Pt.>75%)  Comprehension Comprehension Mode: Auditory Comprehension: 5-Follows basic conversation/direction: With extra time/assistive device  Expression Expression Mode: Verbal Expression: 3-Expresses basic 50 - 74% of the time/requires cueing 25 - 50% of the time. Needs to repeat parts of sentences.  Social Interaction Social Interaction: 4-Interacts appropriately 75 - 89% of the time - Needs redirection for appropriate language or to initiate interaction.  Problem Solving Problem Solving: 3-Solves basic 50 - 74% of the time/requires cueing 25 - 49% of the time  Memory Memory: 4-Recognizes or recalls 75 - 89% of the time/requires cueing 10 - 24% of the time Medical Problem List and Plan:  1. small bowel obstruction with deconditioning as well as Parkinson's disease  2. DVT Prophylaxis/Anticoagulation: Subcutaneous heparin for DVT prophylaxis  3. Decreased nutritional storage. Diet has slowly been advanced after small bowel  obstruction  4. Parkinson's disease. Sinemet as directed and continue current medication regimen  5. Dementia. Continue Exelon patch 6.  Orthostaic hypotension likely multifactorial, PD, meds, prolonged bedrest  LOS (Days) 4 A FACE TO FACE EVALUATION WAS PERFORMED  Lajuanda Penick E 12/26/2011, 10:03 AM

## 2011-12-26 NOTE — Progress Notes (Signed)
Occupational Therapy Session Note  Patient Details  Name: Paul Callahan MRN: [id number] Date of Birth: [birth year]  Today's Date: 12/26/2011 Time: 1740-8144 Time Calculation (min): 55 min  Short Term Goals: Week 1:  OT Short Term Goal 1 (Week 1): Pt will complete bathing at sit to stand level with min assist and min-mod verbal cues for initiation and sequencing. OT Short Term Goal 2 (Week 1): Pt will complete LB dressing with mod assist 3 out of 4 times. OT Short Term Goal 3 (Week 1): Pt will complete toilet transfer with min assist with LRAD OT Short Term Goal 4 (Week 1): Pt will complete tub bench transfer with min assist  Skilled Therapeutic Interventions/Progress Updates:    Pt seen for ADL retraining with focus on initiation, sequencing, sit <> stand, and standing balance.  Pt requested to bathe at sink.  Pt reported "I'm slow" but was able to initiate bathing and sequence through UB and periarea.  Pt required mod cues and physical assist for BLEs.  Pt min assist with ambulation with 4-wheel RW in room.  Pt continues to require total assist for LB dressing.  Therapy Documentation Precautions:  Precautions Precautions: Fall Precaution Comments: h/o lumbar surgery, dementia, Parkinson's dz Required Braces or Orthoses: Other Brace/Splint Other Brace/Splint: R AFO Restrictions Weight Bearing Restrictions: No General:   Vital Signs: Therapy Vitals Temp: 98.3 F (36.8 C) Temp src: Oral Pulse Rate: 72  Resp: 18  BP: 158/78 mmHg Patient Position, if appropriate: Sitting Oxygen Therapy SpO2: 95 % O2 Device: None (Room air) Pain:   Pt with no c/o pain this session.  See FIM for current functional status  Therapy/Group: Individual Therapy  Leonette Monarch 12/26/2011, 8:32 AM

## 2011-12-26 NOTE — Progress Notes (Addendum)
Per State Regulation 482.30 This chart was reviewed for medical necessity with respect to the patient's Admission/Duration of stay. Pt participating in therapies with slow steady progress. Dizziness at times, vestibular eval pending. Meryl Dare                 Nurse Care Manager            Next Review Date: 12/29/11

## 2011-12-27 DIAGNOSIS — Z5189 Encounter for other specified aftercare: Secondary | ICD-10-CM

## 2011-12-27 DIAGNOSIS — R5381 Other malaise: Secondary | ICD-10-CM

## 2011-12-27 DIAGNOSIS — G20 Parkinson's disease: Secondary | ICD-10-CM

## 2011-12-27 NOTE — Progress Notes (Addendum)
Physical Therapy Vestibular Evaluation  Patient Details  Name: Paul Callahan MRN: [id number] Date of Birth: 12-26-1923   Today's Date: 12/27/2011 Time: 1002-1110 Time Calculation (min): 68 min  Problem List:  Patient Active Problem List  Diagnosis  . SBO (small bowel obstruction)  . Acute kidney injury  . Thrombocytopenia  . Physical deconditioning    Past Medical History:  Past Medical History  Diagnosis Date  . Parkinson disease   . Dementia   . Depression   . Bowel obstruction 12/15/2011  . Neuromuscular disorder     hx of parkinsons  . Cancer     hx of colon cancer  . Arthritis    Past Surgical History:  Past Surgical History  Procedure Date  . Eye surgery   . Cataracts   . Hernia repair   . Back surgery     x 5    Assessment & Plan Clinical Impression: Patient is a 78 y.o. right-handed male with history of Parkinson's disease as well as colon cancer with resection and did receive CIR in 2009 after back surgery. Patient required some assistance with dressing and bathing at home but otherwise was independent using an assisted device. Patient does have a chronic right foot drop following back surgery 2009 uses an AFO. Admitted 12/16/2011 with abdominal pain with associated nausea vomiting. X-rays and imaging revealed partial small bowel obstruction likely due to adhesions from previous partial colectomy. A nasogastric tube was placed. General surgery was consulted with conservative management. Latest followup CT of the abdomen showed partial small bowel obstruction. There is fecalization of the small bowel, proximal to the transition point indicates chronicity. His diet was to be slowly advanced after nasogastric tube removed. He was on subcutaneous heparin for DVT prophylaxis. Patient noted to have bouts of acute delirium sundowning with noted history of dementia. Patient transferred to CIR on 12/22/2011 .   Pt reports he has had a h/o multiple falls secondary to vertigo,  "room spinning" in addition to falls as a result of light headedness. Evaluation completed to determine if vestibular causes of dizziness could be eliminated to decrease risk of falls. Pt has significant postural limitations (kyphotic) and very limited neck ROM limiting exam.   Oculomotor Exam: Eye alignment WFL, no spontaneous nystagmus or gaze holding nystagmus. Smooth pursuit, occulomotor movements, saccades, and VOR cancellation were NOT WFL. Uncertain if cause is due to pt's cognitive impairments as he has decreased ability to concentrate on a target. Pt does report that is is hard for him to follow a target with his eyes and he does not know why. Uncertain of etiology.  Head shaking test and head thrust test unable to be performed accurately secondary to pt's limited neck ROM.   Positional testing: Again pt has difficulty reaching appropriate positions however pt was negative with Rt. hallpike Dix testing, had Rt. Beating (without torsion) with Lt. Hallpike dix testing. Unable to appropriately perform head roll testing however with rolling body  to Lt side pt had Rt. Beating nystagmus of >1 min duration, with Rolling to the Rt. Pt had Lt. Beating nystagmus of > 1 min. Lt rolling produced most intense symptoms.   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 Performed modified semont maneuver, pt worn out and unable to complete barbecue roll. Educated primary therapist on barbecue roll technique and need to perform visual stabilization exercises. If pt continues to have symptoms recommend pt attempt the [name].  Can not exclude Cervicogenic dizziness  given symptom presentation.    PT Evaluation Precautions/Restrictions  Fall  Vital Signs Therapy Vitals BP: 134/73 mmHg Patient Position, if appropriate: Sitting Pain Pain Assessment Pain Assessment: No/denies pain  See FIM for current functional status Refer to Care Plan for Long Term  Goals    Wilhemina Bonito 12/27/2011, 1:19 PM

## 2011-12-27 NOTE — Progress Notes (Signed)
Physical Therapy Session Note  Patient Details  Name: Paul Callahan MRN: [id number] Date of Birth: Feb 03, 1924  Today's Date: 12/27/2011 Time: 3086-5784 Time Calculation (min): 44 min  Short Term Goals: Week 1:  PT Short Term Goal 1 (Week 1): = LTG  Therapy Documentation Precautions:  Precautions Precautions: Fall Precaution Comments: h/o lumbar surgery, dementia, Parkinson's dz Required Braces or Orthoses: Other Brace/Splint Other Brace/Splint: R AFO Restrictions Weight Bearing Restrictions: No General: Pain: Pain Assessment Pain Assessment: No/denies pain Mobility:  Supine > sit from flat hospital bed with mod A and max-total verbal cues for rolling and side to sit sequence; patient attempting to perform straight sit up pulling on therapist; decreased assistance needed if patient performs rolling and side to sit from L side Locomotion :  Gait with RW on unit with tactile and verbal cues for upright posture, R lateral weight shift to allow full step length and heel strike of LLE and to maintain safe distance to RW Other Treatments:  Following vestibular assessment this am continued treatment of horizontal BPPV; performed maneuver for repositioning through L sidelying > supine > R sidelying > prone > quadruped and sitting on mat; patient reporting symptoms of dizziness with L sidelying that did resolve and in supine but symptoms did not resolve in supine; symptoms not elicited in R sidelying or prone.  L nystagmus noted in R sidelying.  Will continued to assess if patient will require further maneuvers and will begin gaze stabilization exercises.  See FIM for current functional status  Therapy/Group: Individual Therapy  Edman Circle [facility] 12/27/2011, 5:27 PM

## 2011-12-27 NOTE — Progress Notes (Signed)
Occupational Therapy Session Note  Patient Details  Name: DONTRAY HABERLAND MRN: [id number] Date of Birth: 11/15/1923  Today's Date: 12/27/2011 Time: 9528-4132 and 4401-0272 Time Calculation (min): 43 min and 30 min  Short Term Goals: Week 1:  OT Short Term Goal 1 (Week 1): Pt will complete bathing at sit to stand level with min assist and min-mod verbal cues for initiation and sequencing. OT Short Term Goal 2 (Week 1): Pt will complete LB dressing with mod assist 3 out of 4 times. OT Short Term Goal 3 (Week 1): Pt will complete toilet transfer with min assist with LRAD OT Short Term Goal 4 (Week 1): Pt will complete tub bench transfer with min assist  Skilled Therapeutic Interventions/Progress Updates:    1) Pt seen for ADL retraining with focus on bed mobility, functional mobility with 4 wheeled walker, and bathing and dressing.  Pt completes UB bathing and dressing with setup assist, but requires total assist for LB.  From discussion with PT, pt's wife reports he needed assist with drying and dressing.  Pt c/o dizziness and lightheadedness with activity.  Pt returned to sitting EOB and BP taken 120/73.  TEDS donned.  Pt requested to lie down until next session.    2) 1:1 OT with focus on BUE strengthening to assist with sit to stand from various surfaces.  Pt in bed upon arrival with wife present.  Discussed pt's prior level of function with mobility and bathing and dressing.  Pt's wife reports that she and/or the aide would dress him completely but he would do the bathing.  Pt ambulated with 4 wheeled walker to therapy gym with min/steady assist.  Pt reports lightheadedness upon sitting, but no reports of true dizziness.  Engaged in 2 reps of 10 bicep curls and chest presses with 2# dowel rod.  Pt reporting fatigue post UE exercises.  Therapy Documentation Precautions:  Precautions Precautions: Fall Precaution Comments: h/o lumbar surgery, dementia, Parkinson's dz Required Braces or Orthoses:  Other Brace/Splint Other Brace/Splint: R AFO Restrictions Weight Bearing Restrictions: No General: General Amount of Missed OT Time (min): 17 Minutes Vital Signs: Therapy Vitals BP: 120/73 mmHg Patient Position, if appropriate: Sitting Pain: Pain Assessment Pain Assessment: No/denies pain  See FIM for current functional status  Therapy/Group: Individual Therapy  Leonette Monarch 12/27/2011, 9:25 AM

## 2011-12-27 NOTE — Progress Notes (Signed)
Continent of bowel and bladder. Last bowel movement 12/24/11 after suppository administrated. Poor safety awareness. Quick release and bed alarm in use. Heel red, but blanchable. Mild edema bilaterally,  L>R. Elevating extremity when not in use. No c/o pain. Wife in room, supportive during portion of shift.

## 2011-12-27 NOTE — Progress Notes (Signed)
Patient ID: Paul Callahan, male   DOB: [birth year], 78 y.o.   MRN: [id number]  Subjective/Complaints: Dizziness when going from lying to sitting to standing  Meds reviewed Was dizzy at home, no CP or SOB Review of Systems  Neurological: Positive for focal weakness.  Psychiatric/Behavioral: Positive for memory loss.  All other systems reviewed and are negative.  Objective: Vital Signs: Blood pressure 168/91, pulse 68, temperature 98 F (36.7 C), temperature source Oral, resp. rate 19, height 5\' 7"  (1.702 m), weight 77.9 kg (171 lb 11.8 oz), SpO2 95.00%. No results found. No results found for this or any previous visit (from the past 72 hour(s)).   HEENT: masked facies Cardio: RRR Resp: CTA B/L GI: BS positive Extremity:  No Edema Skin:   Intact Neuro: Confused and Abnormal Sensory dorsum R foot Musc/Skel:  Other R leg length discrepency R foot drop  Assessment/Plan: 1. Functional deficits secondary to deconditioning following SBO, parkinson's which require 3+ hours per day of interdisciplinary therapy in a comprehensive inpatient rehab setting. Physiatrist is providing close team supervision and 24 hour management of active medical problems listed below. Physiatrist and rehab team continue to assess barriers to discharge/monitor patient progress toward functional and medical goals. FIM: FIM - Bathing Bathing Steps Patient Completed: Chest;Right Arm;Left Arm;Abdomen;Front perineal area;Buttocks Bathing: 3: Mod-Patient completes 5-7 28f 10 parts or 50-74%  FIM - Upper Body Dressing/Undressing Upper body dressing/undressing steps patient completed: Thread/unthread right sleeve of pullover shirt/dresss;Thread/unthread left sleeve of pullover shirt/dress Upper body dressing/undressing: 3: Mod-Patient completed 50-74% of tasks FIM - Lower Body Dressing/Undressing Lower body dressing/undressing steps patient completed: Pull underwear up/down;Pull pants up/down Lower body  dressing/undressing: 1: Total-Patient completed less than 25% of tasks  FIM - Toileting Toileting: 4: Steadying assist  FIM - Diplomatic Services operational officer Devices: Elevated toilet seat;Grab bars;Walker Toilet Transfers: 3-To toilet/BSC: Mod A (lift or lower assist);3-From toilet/BSC: Mod A (lift or lower assist)  FIM - Bed/Chair Transfer Bed/Chair Transfer Assistive Devices: Walker Bed/Chair Transfer: 3: Sit > Supine: Mod A (lifting assist/Pt. 50-74%/lift 2 legs);3: Bed > Chair or W/C: Mod A (lift or lower assist);3: Chair or W/C > Bed: Mod A (lift or lower assist)  FIM - Locomotion: Wheelchair Distance: 15' with bilat UE with total verbal and hand over hand facilitation for simultaneous UE propulsion to prevent steering to R into wall Locomotion: Wheelchair: 1: Total Assistance/staff pushes wheelchair (Pt<25%) FIM - Locomotion: Ambulation Locomotion: Ambulation Assistive Devices: Designer, industrial/product Ambulation/Gait Assistance: 3: Mod assist Locomotion: Ambulation: 2: Travels 50 - 149 ft with moderate assistance (Pt: 50 - 74%)  Comprehension Comprehension Mode: Auditory Comprehension: 5-Understands complex 90% of the time/Cues < 10% of the time  Expression Expression Mode: Verbal Expression: 3-Expresses basic 50 - 74% of the time/requires cueing 25 - 50% of the time. Needs to repeat parts of sentences.  Social Interaction Social Interaction: 4-Interacts appropriately 75 - 89% of the time - Needs redirection for appropriate language or to initiate interaction.  Problem Solving Problem Solving: 3-Solves basic 50 - 74% of the time/requires cueing 25 - 49% of the time  Memory Memory: 4-Recognizes or recalls 75 - 89% of the time/requires cueing 10 - 24% of the time Medical Problem List and Plan:  1. small bowel obstruction with deconditioning as well as Parkinson's disease  2. DVT Prophylaxis/Anticoagulation: Subcutaneous heparin for DVT prophylaxis  3. Decreased  nutritional storage. Diet has slowly been advanced after small bowel obstruction  4. Parkinson's disease. Sinemet as directed and continue  current medication regimen  5. Dementia. Continue Exelon patch 6.  Orthostatic hypotension likely multifactorial, PD, meds (sinemt and Exelon), prolonged bedrest  LOS (Days) 5 A FACE TO FACE EVALUATION WAS PERFORMED  Hillman Attig E 12/27/2011, 8:06 AM

## 2011-12-28 LAB — URINALYSIS, ROUTINE W REFLEX MICROSCOPIC
Bilirubin Urine: NEGATIVE
Glucose, UA: NEGATIVE mg/dL
Hgb urine dipstick: NEGATIVE
Ketones, ur: 15 mg/dL — AB
Leukocytes, UA: NEGATIVE
pH: 5 (ref 5.0–8.0)

## 2011-12-28 MED ORDER — DOCUSATE SODIUM 100 MG PO CAPS
100.0000 mg | ORAL_CAPSULE | Freq: Two times a day (BID) | ORAL | Status: DC
  Administered 2011-12-28 – 2012-01-03 (×13): 100 mg via ORAL
  Filled 2011-12-28 (×15): qty 1

## 2011-12-28 NOTE — Progress Notes (Signed)
Physical Therapy Session Note  Patient Details  Name: Paul Callahan MRN: [id number] Date of Birth: 02-23-1924  Today's Date: 12/28/2011 Time: 4098-1191 Time Calculation (min): 60 min  Short Term Goals: Week 1:  PT Short Term Goal 1 (Week 1): = LTG  Skilled Therapeutic Interventions/Progress Updates:   Performed [name] to address horizontal cupulolithiasis to the right and to the left.  Pt reported no symptoms of the room spinning once in the position (sidelying with head rotated downward, but when pt would return to sitting from right or left he would report approx. 30 sec of dizziness.  Only performed 1 x to each side due to pt's tolerance, pt had commented that he was not going to do any more strange positions, due to the difficulty with getting him in these positions.  Attempted teaching pt x1 exercises in the horizontal plane, pt does not have normal rotation of his cervical spine, tends to do more of a rotational lean.  Pt had difficulty keeping his eyes on the target, performing with a very slow speed.  Observed ? Frog jumping saccades with the x1 exercises.  Note pt's head position is tilted to the right with kyphosis, asked pt if he had neck pain he reported yes and that he had received PT for it.  Performed gentle stretching and massage to cervical spine with hot pack to attempt to relax musculature.  Pt again with dizziness when returning supine to sit following treatment of the neck, lasting approx 30 sec.  Checked BP when returning to sitting = 120/80.  Pt performed gait to and from his room with RW with min@, 120'x2.   Therapy Documentation Precautions:  Precautions Precautions: Fall Precaution Comments: h/o lumbar surgery, dementia, Parkinson's dz Required Braces or Orthoses: Other Brace/Splint Other Brace/Splint: R AFO Restrictions Weight Bearing Restrictions: No Pain:  No pain reported until questioned about his neck pain, with pt reporting chronic neck pain that had  been treated by PT. Therapy/Group: Individual Therapy  [name] 12/28/2011, 3:54 PM

## 2011-12-28 NOTE — Progress Notes (Signed)
Physical Therapy Note  Patient Details  Name: Paul Callahan MRN: [id number] Date of Birth: 12-13-1923 Today's Date: 12/28/2011  1600-1630 (30 minutes) individual Pain: no complaint of pain Focus of treatment: Gait training/endurance; Therapeutic exercises focused on bilateral LE strengthening/increasing flexibility Treatment : Gait to/from room 120 feet X 2 rollator SBA with decreased step length bilaterally with forward flexed trunk; sit ><supine ><side SBA (mat) ; bilateral hip adductor, hip flexor stretches (passive); trunk rotation and hip/knee flexion/extension using therapy ball in supine.   [name],JIM 12/28/2011, 4:33 PM

## 2011-12-28 NOTE — Progress Notes (Signed)
Occupational Therapy Session Note  Patient Details  Name: Paul Callahan MRN: [id number] Date of Birth: 1924-06-25  Today's Date: 12/28/2011 Time: 4098-1191 and 4782-9562 Time Calculation (min): 55 min and 40 min  Short Term Goals: Week 1:  OT Short Term Goal 1 (Week 1): Pt will complete bathing at sit to stand level with min assist and min-mod verbal cues for initiation and sequencing. OT Short Term Goal 2 (Week 1): Pt will complete LB dressing with mod assist 3 out of 4 times. OT Short Term Goal 3 (Week 1): Pt will complete toilet transfer with min assist with LRAD OT Short Term Goal 4 (Week 1): Pt will complete tub bench transfer with min assist  Skilled Therapeutic Interventions/Progress Updates:    1) Pt seen for ADL retraining with focus on functional mobility with 4 wheeled walker, transfers, and bathing and dressing.  Pt completed bathing at walk-in shower level with focus on safety and transfers, as well as increased participation in bathing.  Pt required assist with washing buttocks and both feet.  Pt donned shirt with min assist and was able to don underwear this session with increased demonstration and encouragement.  Pt completed grooming in standing with overall supervision.  2) 1:1 OT with focus on safety with functional mobility and overall strengthening and activity tolerance.  Completed Nustep for 10 mins on level 4 with 1 rest break.  BUE strengthening with 3# dowel rod with 3 reps of 10 bicep curls and chest presses.  Pt with increased tolerance of BUE this session.  Therapy Documentation Precautions:  Precautions Precautions: Fall Precaution Comments: h/o lumbar surgery, dementia, Parkinson's dz Required Braces or Orthoses: Other Brace/Splint Other Brace/Splint: R AFO Restrictions Weight Bearing Restrictions: No Pain: Pain Assessment Pain Assessment: No/denies pain  See FIM for current functional status  Therapy/Group: Individual Therapy  Leonette Monarch 12/28/2011, 10:53 AM

## 2011-12-28 NOTE — Plan of Care (Signed)
Problem: RH BOWEL ELIMINATION Goal: RH STG MANAGE BOWEL WITH ASSISTANCE STG Manage Bowel with Assistance with min assist  Outcome: Progressing Incontinent of bowel today started before on toilet

## 2011-12-28 NOTE — Progress Notes (Signed)
Patient ID: Paul Callahan, male   DOB: June 09, 1924, 78 y.o.   MRN: [id number]  Subjective/Complaints: Dizziness when going from lying to sitting to standing  Meds reviewed Was dizzy at home, no CP or SOB.  RN notes malodorous urine Review of Systems  Genitourinary: Positive for dysuria, urgency and frequency. Negative for hematuria and flank pain.  Neurological: Positive for focal weakness.  Psychiatric/Behavioral: Positive for memory loss.  All other systems reviewed and are negative.  Objective: Vital Signs: Blood pressure 110/78, pulse 77, temperature 98 F (36.7 C), temperature source Oral, resp. rate 19, height 5\' 7"  (1.702 m), weight 77.9 kg (171 lb 11.8 oz), SpO2 95.00%. No results found. No results found for this or any previous visit (from the past 72 hour(s)).   HEENT: masked facies Cardio: RRR Resp: CTA B/L GI: BS positive Extremity:  No Edema Skin:   Intact Neuro: Confused and Abnormal Sensory dorsum R foot Musc/Skel:  Other R leg length discrepency R foot drop  Assessment/Plan: 1. Functional deficits secondary to deconditioning following SBO, parkinson's which require 3+ hours per day of interdisciplinary therapy in a comprehensive inpatient rehab setting. Physiatrist is providing close team supervision and 24 hour management of active medical problems listed below. Physiatrist and rehab team continue to assess barriers to discharge/monitor patient progress toward functional and medical goals. FIM: FIM - Bathing Bathing Steps Patient Completed: Chest;Right Arm;Left Arm;Abdomen;Front perineal area;Buttocks Bathing: 3: Mod-Patient completes 5-7 61f 10 parts or 50-74%  FIM - Upper Body Dressing/Undressing Upper body dressing/undressing steps patient completed: Thread/unthread right sleeve of pullover shirt/dresss;Thread/unthread left sleeve of pullover shirt/dress;Put head through opening of pull over shirt/dress;Pull shirt over trunk Upper body dressing/undressing: 5:  Set-up assist to: Obtain clothing/put away FIM - Lower Body Dressing/Undressing Lower body dressing/undressing steps patient completed: Pull underwear up/down;Pull pants up/down Lower body dressing/undressing: 1: Total-Patient completed less than 25% of tasks  FIM - Toileting Toileting: 4: Steadying assist  FIM - Diplomatic Services operational officer Devices: Elevated toilet seat;Grab bars;Walker Toilet Transfers: 3-To toilet/BSC: Mod A (lift or lower assist);3-From toilet/BSC: Mod A (lift or lower assist)  FIM - Bed/Chair Transfer Bed/Chair Transfer Assistive Devices: Manufacturing systems engineer Transfer: 3: Supine > Sit: Mod A (lifting assist/Pt. 50-74%/lift 2 legs;3: Sit > Supine: Mod A (lifting assist/Pt. 50-74%/lift 2 legs);4: Bed > Chair or W/C: Min A (steadying Pt. > 75%);4: Chair or W/C > Bed: Min A (steadying Pt. > 75%)  FIM - Locomotion: Wheelchair Distance: 15' with bilat UE with total verbal and hand over hand facilitation for simultaneous UE propulsion to prevent steering to R into wall Locomotion: Wheelchair: 1: Total Assistance/staff pushes wheelchair (Pt<25%) FIM - Locomotion: Ambulation Locomotion: Ambulation Assistive Devices: Designer, industrial/product Ambulation/Gait Assistance: 3: Mod assist Locomotion: Ambulation: 2: Travels 50 - 149 ft with moderate assistance (Pt: 50 - 74%)  Comprehension Comprehension Mode: Auditory Comprehension: 4-Understands basic 75 - 89% of the time/requires cueing 10 - 24% of the time  Expression Expression Mode: Verbal Expression: 4-Expresses basic 75 - 89% of the time/requires cueing 10 - 24% of the time. Needs helper to occlude trach/needs to repeat words.  Social Interaction Social Interaction: 4-Interacts appropriately 75 - 89% of the time - Needs redirection for appropriate language or to initiate interaction.  Problem Solving Problem Solving: 3-Solves basic 50 - 74% of the time/requires cueing 25 - 49% of the time  Memory Memory:  4-Recognizes or recalls 75 - 89% of the time/requires cueing 10 - 24% of the time Medical Problem List  and Plan:  1. small bowel obstruction with deconditioning as well as Parkinson's disease  2. DVT Prophylaxis/Anticoagulation: Subcutaneous heparin for DVT prophylaxis  3. Decreased nutritional storage. Diet has slowly been advanced after small bowel obstruction  4. Parkinson's disease. Sinemet as directed and continue current medication regimen  5. Dementia. Continue Exelon patch 6.  Orthostatic hypotension likely multifactorial, PD, meds (sinemt and Exelon), prolonged bedrest 7.  Constipation add colace 8. Urine inc dysuria will check UA LOS (Days) 6 A FACE TO FACE EVALUATION WAS PERFORMED  Kiriana Worthington E 12/28/2011, 9:14 AM

## 2011-12-28 NOTE — Plan of Care (Signed)
Problem: RH BOWEL ELIMINATION Goal: RH STG MANAGE BOWEL W/MEDICATION W/ASSISTANCE STG Manage Bowel with Medication with Assistance.  Outcome: Progressing Started on colace 100 mg po BID today

## 2011-12-28 NOTE — Plan of Care (Signed)
Problem: RH BLADDER ELIMINATION Goal: RH STG MANAGE BLADDER WITH ASSISTANCE STG Manage Bladder With Mod Assistance  Uses condom catheter at hs  Outcome: Progressing Offering toileting q 3 hrs and prn, up to BR via w/c

## 2011-12-29 NOTE — Progress Notes (Signed)
Patient noted to be crying today through out the shift at different times including during OT session. At other times patient is tearful. Patient not able to give specific reason for being in tears. Wife requesting that this Clinical research associate ask Doctor to see if Cymbalta could be increased to twice a day as he was on at home prior to admission. Discussed with wife and patient would let doctor know in am rounds and discuss with patient at that time. Both verbalized understanding. Emotional support given to patient and wife. Will continue to monitor. Roberts-VonCannon, Tamisha Nordstrom Elon Jester

## 2011-12-29 NOTE — Progress Notes (Signed)
Per State Regulation 482.30 This chart was reviewed for medical necessity with respect to the patient's Admission/Duration of stay. Lane-Morgan, Saralee Bolick Anne                 Nurse Care Manager            Next Review Date: 01/02/12   

## 2011-12-29 NOTE — Progress Notes (Signed)
Patient ID: Paul Callahan, male   DOB: May 22, 1924, 78 y.o.   MRN: [id number]  Subjective/Complaints: Dizziness when going from lying to sitting to standing  Orthostaic drops >50 mmHg lying to standing Was dizzy at home, no CP or SOB.  RN notes malodorous urine Review of Systems  Genitourinary: Positive for dysuria, urgency and frequency. Negative for hematuria and flank pain.  Neurological: Positive for focal weakness.  Psychiatric/Behavioral: Positive for memory loss.  All other systems reviewed and are negative.  Objective: Vital Signs: Blood pressure 102/66, pulse 77, temperature 98.1 F (36.7 C), temperature source Oral, resp. rate 18, height 5\' 7"  (1.702 m), weight 78.019 kg (172 lb), SpO2 94.00%. No results found. Results for orders placed during the hospital encounter of 12/22/11 (from the past 72 hour(s))  URINALYSIS, ROUTINE W REFLEX MICROSCOPIC     Status: Abnormal   Collection Time   12/28/11  2:48 PM      Component Value Range Comment   Color, Urine YELLOW  YELLOW    APPearance CLEAR  CLEAR    Specific Gravity, Urine 1.020  1.005 - 1.030    pH 5.0  5.0 - 8.0    Glucose, UA NEGATIVE  NEGATIVE mg/dL    Hgb urine dipstick NEGATIVE  NEGATIVE    Bilirubin Urine NEGATIVE  NEGATIVE    Ketones, ur 15 (*) NEGATIVE mg/dL    Protein, ur NEGATIVE  NEGATIVE mg/dL    Urobilinogen, UA 1.0  0.0 - 1.0 mg/dL    Nitrite NEGATIVE  NEGATIVE    Leukocytes, UA NEGATIVE  NEGATIVE MICROSCOPIC NOT DONE ON URINES WITH NEGATIVE PROTEIN, BLOOD, LEUKOCYTES, NITRITE, OR GLUCOSE <1000 mg/dL.     HEENT: masked facies Cardio: RRR Resp: CTA B/L GI: BS positive Extremity:  No Edema Skin:   Intact Neuro: Confused and Abnormal Sensory dorsum R foot Musc/Skel:  Other R leg length discrepency R foot drop  Assessment/Plan: 1. Functional deficits secondary to deconditioning following SBO, parkinson's which require 3+ hours per day of interdisciplinary therapy in a comprehensive inpatient rehab  setting. Physiatrist is providing close team supervision and 24 hour management of active medical problems listed below. Physiatrist and rehab team continue to assess barriers to discharge/monitor patient progress toward functional and medical goals. FIM: FIM - Bathing Bathing Steps Patient Completed: Chest;Right Arm;Left Arm;Abdomen;Front perineal area;Right upper leg;Left upper leg Bathing: 3: Mod-Patient completes 5-7 48f 10 parts or 50-74%  FIM - Upper Body Dressing/Undressing Upper body dressing/undressing steps patient completed: Thread/unthread right sleeve of pullover shirt/dresss;Thread/unthread left sleeve of pullover shirt/dress;Pull shirt over trunk Upper body dressing/undressing: 4: Min-Patient completed 75 plus % of tasks FIM - Lower Body Dressing/Undressing Lower body dressing/undressing steps patient completed: Thread/unthread right underwear leg;Thread/unthread left underwear leg;Pull underwear up/down;Pull pants up/down Lower body dressing/undressing: 2: Max-Patient completed 25-49% of tasks  FIM - Toileting Toileting steps completed by patient: Adjust clothing prior to toileting;Performs perineal hygiene Toileting: 4: Steadying assist  FIM - Diplomatic Services operational officer Devices: Best boy Transfers: 4-To toilet/BSC: Min A (steadying Pt. > 75%);4-From toilet/BSC: Min A (steadying Pt. > 75%)  FIM - Bed/Chair Transfer Bed/Chair Transfer Assistive Devices: Manufacturing systems engineer Transfer: 3: Supine > Sit: Mod A (lifting assist/Pt. 50-74%/lift 2 legs;5: Sit > Supine: Supervision (verbal cues/safety issues);4: Bed > Chair or W/C: Min A (steadying Pt. > 75%);4: Chair or W/C > Bed: Min A (steadying Pt. > 75%)  FIM - Locomotion: Wheelchair Distance: 15' with bilat UE with total verbal and hand over hand facilitation for  simultaneous UE propulsion to prevent steering to R into wall Locomotion: Wheelchair: 0: Activity did not occur FIM - Locomotion:  Ambulation Locomotion: Ambulation Assistive Devices: Designer, industrial/product Ambulation/Gait Assistance: 4: Min guard Locomotion: Ambulation: 2: Travels 50 - 149 ft with minimal assistance (Pt.>75%)  Comprehension Comprehension Mode: Auditory Comprehension: 4-Understands basic 75 - 89% of the time/requires cueing 10 - 24% of the time  Expression Expression Mode: Verbal Expression: 4-Expresses basic 75 - 89% of the time/requires cueing 10 - 24% of the time. Needs helper to occlude trach/needs to repeat words.  Social Interaction Social Interaction: 4-Interacts appropriately 75 - 89% of the time - Needs redirection for appropriate language or to initiate interaction.  Problem Solving Problem Solving: 2-Solves basic 25 - 49% of the time - needs direction more than half the time to initiate, plan or complete simple activities  Memory Memory: 3-Recognizes or recalls 50 - 74% of the time/requires cueing 25 - 49% of the time Medical Problem List and Plan:  1. small bowel obstruction with deconditioning as well as Parkinson's disease  2. DVT Prophylaxis/Anticoagulation: Subcutaneous heparin for DVT prophylaxis  3. Decreased nutritional storage. Diet has slowly been advanced after small bowel obstruction  4. Parkinson's disease. Sinemet as directed and continue current medication regimen  5. Dementia. Continue Exelon patch 6.  Orthostatic hypotension likely multifactorial, PD, meds (sinemt and Exelon), prolonged bedrest, TEDs and abd binder 7.  Constipation add colace 8. Urine inc dysuria - UA LOS (Days) 7 A FACE TO FACE EVALUATION WAS PERFORMED  Kelena Garrow E 12/29/2011, 8:56 AM

## 2011-12-29 NOTE — Care Management Note (Signed)
Met with pt and his wife yesterday to report on team conference. Explained change in therapy schedule, both in agreement with this. Explained S-Min A goals. Both in agreement with goals and d/c date of 01/05/12.

## 2011-12-29 NOTE — Progress Notes (Signed)
Physical Therapy Weekly Progress Note  Patient Details  Name: Paul Callahan MRN: [id number] Date of Birth: 07/20/1923  Today's Date: 12/29/2011 Time: 1110-1204 Time Calculation (min): 54 min  Patient is making slow but steady progress towards PT LTG and has met 0 of 8 long term goals.  LTG were initially set at min A overall; patient is currently min A overall for bed mobility, bed <> chair transfers, ramp negotiation and for ambulation with 4WW in controlled environment; LTG upgraded to supervision-min A overall to reflect patient's progress.  Short term goals not set due to estimated length of stay.  Patient's participation in therapy has been limited by dizziness, nausea, fatigue associated with BP issues and vestibular impairments.  Therapy has attempted to address these vestibular impairments through repositioning maneuvers but resolution has been limited secondary to patient's significant postural abnormalities and rigidity from Parkinson's disease.  Patient was placed on 15 hrs therapy over 7 days to allow patient to better tolerate therapies.    Patient continues to demonstrate the following deficits: decreased endurance and activity tolerance, increased tone with decreased ROM, decreased strength, impaired postural control, dynamic sitting and standing balance, gait, dizziness, impaired memory and sequencing and therefore will continue to benefit from skilled PT intervention to enhance overall performance with activity tolerance, balance, postural control, ability to compensate for deficits and functional transfers and gait.  See Patient's Care Plan for progression toward long term goals.  Patient progressing toward long term goals..  Plan of care revisions: goals upgraded to supervision-min A overall.  Therapy Documentation Precautions:  Precautions Precautions: Fall Precaution Comments: h/o lumbar surgery, dementia, Parkinson's dz Required Braces or Orthoses: Other Brace/Splint Other  Brace/Splint: R AFO Restrictions Weight Bearing Restrictions: No Vital Signs:  With TEDS and Abdominal binder donned BP in sitting 121/76, HR 75; standing decreased to 102/67 HR 87 with c/o being lightheaded; returned to sitting and lowered binder more over abdomen; no c/o lightheadedness or dizziness during ambulation Pain: Pain Assessment Pain Assessment: No/denies pain Mobility:  Performed simulated car transfer training with stand pivot with RW to low car with min A; patient able to place LE into and out of car with UE assisting but required min A for anterior lean to fully stand from low car Locomotion : Ambulation Ambulation/Gait Assistance: 4: Min assist with 4WW in controlled environment with verbal and tactile cues for more upright trunk to allow for full RLE swing > heel strike, increased stance RLE to allow for LLE full swing x 100' x 2 Other Treatments:  NMR for lateral and anterior/posterior weight shifts in sitting without UE or back support during R lateral weight shifts to R pelvis and R rib expansion and activation of trunk and RLE extensors during LLE toe taps to step; R lateral weight shifts in sitting during bilat UE pushing rolling table with facilitation at trunk for full anterior lean over BOS and coming into a standing position x 5 reps; standing R lateral weight shifts and activation of RLE extensors during LLE forward and retro stepping; all with mod-max facilitation at trunk and RLE for full weight shift and positioning of R hip in IR   See FIM for current functional status  Therapy/Group: Individual Therapy  [name] 12/29/2011, 12:12 PM

## 2011-12-29 NOTE — Patient Care Conference (Signed)
Inpatient RehabilitationTeam Conference Note Date: 12/28/2011   Time: 1115 AM    Patient Name: Paul Callahan      Medical Record Number: [id number]  Date of Birth: 1924-03-15 Sex: Male         Room/Bed: 4140/4140-01 Payor Info: Payor: MEDICARE  Plan: MEDICARE PART A AND B  Product Type: *No Product type*     Admitting Diagnosis: SBO,PARKINSONS  Admit Date/Time:  12/22/2011  4:18 PM Admission Comments: No comment available   Primary Diagnosis:  Physical deconditioning Principal Problem: Physical deconditioning  Patient Active Problem List   Diagnosis Date Noted  . Physical deconditioning 12/22/2011  . SBO (small bowel obstruction) 12/16/2011  . Acute kidney injury 12/16/2011  . Thrombocytopenia 12/16/2011    Expected Discharge Date: Expected Discharge Date: 01/05/12  Team Members Present: Physician: Dr. Claudette Laws Case Manager Present: Lutricia Horsfall, RN Social Worker Present: Amada Jupiter, LCSW Nurse Present: Gregor Hams, RN PT Present: Edman Circle, Judith Blonder, PTA OT Present: Leonette Monarch, Felipa Eth, OT SLP Present: Fae Pippin, SLP PPS Coordinator: Tora Duck, RN    Current Status/Progress Goal Weekly Team Focus  Medical   poor endurance, orthostatic hypotension, dementia  improve activity tolerance, get back to cognitive baseline  assess cognitive baseline, reduced to 15 hours per week therapy   Bowel/Bladder   Pt continent of bowel and baldder. LBM 12/24/11  Continent of bowel and bladder  Monitor frequency of bowel movement   Swallow/Nutrition/ Hydration             ADL's   setup UB bathing and dressing, mod assist LB bathing, total assist LB dressing, min assist toilet transfers  min assist overall, supervision UB dsg and grooming  activity tolerance, BUE strengthening, family education   Mobility   min-mod A overall  supervision-min A overall  vestibular eval/treat, endurance, transfers, gait   Communication               Safety/Cognition/ Behavioral Observations            Pain   No c/o pain  <3  Monitor   Skin   Skin CDI  <3  Routine turn q 2hrs      *See Interdisciplinary Assessment and Plan and progress notes for long and short-term goals  Barriers to Discharge: need to establish functional baseline with help from family    Possible Resolutions to Barriers:  identified post discharge caregivers    Discharge Planning/Teaching Needs:    Home with wife and personal care attendant to assist.     Team Discussion:  Discussion of pt's dx, hx, goals.Pt with orthostatic hypotension and dizziness. Wearing teds. Nystagmus noted. Fatigues easily -- change tx schedule to accommodate. OT may downgrade goals.  Revisions to Treatment Plan:  Change to 15hr/7day tx schedule.   Continued Need for Acute Rehabilitation Level of Care: The patient requires daily medical management by a physician with specialized training in physical medicine and rehabilitation for the following conditions: Daily direction of a multidisciplinary physical rehabilitation program to ensure safe treatment while eliciting the highest outcome that is of practical value to the patient.: Yes Daily medical management of patient stability for increased activity during participation in an intensive rehabilitation regime.: Yes Daily analysis of laboratory values and/or radiology reports with any subsequent need for medication adjustment of medical intervention for : Neurological problems;Other  Meryl Dare 12/29/2011, 10:53 AM

## 2011-12-29 NOTE — Progress Notes (Signed)
Occupational Therapy Session Note  Patient Details  Name: Paul Callahan MRN: [id number] Date of Birth: Jan 27, 1924  Today's Date: 12/29/2011 Time: 5284-1324 and 4010-2725 Time Calculation (min): 57 min and 28 min  Short Term Goals: Week 1:  OT Short Term Goal 1 (Week 1): Pt will complete bathing at sit to stand level with min assist and min-mod verbal cues for initiation and sequencing. OT Short Term Goal 2 (Week 1): Pt will complete LB dressing with mod assist 3 out of 4 times. OT Short Term Goal 3 (Week 1): Pt will complete toilet transfer with min assist with LRAD OT Short Term Goal 4 (Week 1): Pt will complete tub bench transfer with min assist  Skilled Therapeutic Interventions/Progress Updates:    1) Pt seen for ADL retraining with focus on safe mobility with RW, grooming in standing, weight shifting in standing, and increased independence with LB dressing.  Educated pt on various ways to position to increase ability to don underwear and pants.  Pt return demonstrated with pants.  Pt requires increased time with functional mobility.  Pt with constant reports of being fatigued and "slow".  BUE strengthening with yellow theraband in sitting 2 reps of 10 chest presses and bilateral shoulder extension.  Pt with decreased ability secondary to rigidity with PD.  2) 1:1 OT with focus on sit <> stands with hand placement and increased use weight shifting.  Pt tearful throughout session, reporting he never "expected all this to happen".  Pt expressed overwhelmed feelings with being sick and being away from home for so long.  Engaged in sit <> stand and stand pivot transfers from bench outside.  Educated pt on safe mobility and hand placement for transfers.  Therapy Documentation Precautions:  Precautions Precautions: Fall Precaution Comments: h/o lumbar surgery, dementia, Parkinson's dz Required Braces or Orthoses: Other Brace/Splint Other Brace/Splint: R AFO Restrictions Weight Bearing  Restrictions: No Pain: Pain Assessment Pain Assessment: No/denies pain  See FIM for current functional status  Therapy/Group: Individual Therapy  Leonette Monarch 12/29/2011, 12:17 PM

## 2011-12-30 DIAGNOSIS — G20 Parkinson's disease: Secondary | ICD-10-CM

## 2011-12-30 DIAGNOSIS — R5381 Other malaise: Secondary | ICD-10-CM

## 2011-12-30 DIAGNOSIS — Z5189 Encounter for other specified aftercare: Secondary | ICD-10-CM

## 2011-12-30 LAB — URINE CULTURE: Colony Count: 10000

## 2011-12-30 NOTE — Plan of Care (Signed)
Problem: RH Dressing Goal: LTG Patient will perform lower body dressing w/assist (OT) LTG: Patient will perform lower body dressing with assist, with/without cues in positioning using equipment (OT)  Downgraded 12/30/11

## 2011-12-30 NOTE — Progress Notes (Signed)
Physical Therapy Session Note  Patient Details  Name: Paul Callahan MRN: [id number] Date of Birth: 1923/10/07  Today's Date: 12/30/2011 Time: 1105-1202 Time Calculation (min): 57 min  Short Term Goals: Week 1:  PT Short Term Goal 1 (Week 1): = LTG  Therapy Documentation Precautions:  Precautions Precautions: Fall Precaution Comments: h/o lumbar surgery, dementia, Parkinson's dz Required Braces or Orthoses: Other Brace/Splint Other Brace/Splint: R AFO Restrictions Weight Bearing Restrictions: No Pain: Pain Assessment Pain Assessment: No/denies pain, but does report nausea; did not report room spinning during sup <> sit on flat mat today Locomotion : Ambulation Ambulation/Gait Assistance: 4: Min assist Ambulation Distance (Feet): 100 Feet Assistive device: 4-wheeled walker;[name] walker Ambulation/Gait Assistance Details: Pre-gait training in standing with EVA for more upright posture with R lateral weight shifts and activation of RLE extensors during R single limb stance x 5 reps x 5 seconds each.  Gait training with EVA walker to facilitate more upright trunk and then with rollator x 100' x 2 on unit with verbal and tactile cues to maintain upright gaze and trunk with increased R stance time to allow full step length LLE  Other Treatments: Treatments Neuromuscular Facilitation: Right;Left;Upper Extremity;Lower Extremity through rotational movements in supine to increase functional ROM and decrease rigidity during cervical retractions/rotation, bilat UE scapular retraction and shoulder ER, upper trunk rotation with bilat UE PNF D2 flexion<>extension movements to R and L, lower trunk rotation with LE on unstable surface for increased core activation.  See FIM for current functional status  Therapy/Group: Individual Therapy  Edman Circle [facility] 12/30/2011, 12:45 PM

## 2011-12-30 NOTE — Progress Notes (Signed)
Patient ID: Paul Callahan, male   DOB: [birth year], 78 y.o.   MRN: [id number]  Subjective/Complaints: Dizziness when going from lying to sitting to standing  Orthostaic drops >50 mmHg lying to standing.  Wife notes emotional lability but this has been present during entire hospital stay Was dizzy at home, no CP or SOB.  Review of Systems  Genitourinary: Positive for dysuria, urgency and frequency. Negative for hematuria and flank pain.  Neurological: Positive for focal weakness.  Psychiatric/Behavioral: Positive for memory loss.  All other systems reviewed and are negative.  Objective: Vital Signs: Blood pressure 130/77, pulse 81, temperature 98.3 F (36.8 C), temperature source Oral, resp. rate 19, height 5\' 7"  (1.702 m), weight 78.019 kg (172 lb), SpO2 95.00%. No results found. Results for orders placed during the hospital encounter of 12/22/11 (from the past 72 hour(s))  URINALYSIS, ROUTINE W REFLEX MICROSCOPIC     Status: Abnormal   Collection Time   12/28/11  2:48 PM      Component Value Range Comment   Color, Urine YELLOW  YELLOW    APPearance CLEAR  CLEAR    Specific Gravity, Urine 1.020  1.005 - 1.030    pH 5.0  5.0 - 8.0    Glucose, UA NEGATIVE  NEGATIVE mg/dL    Hgb urine dipstick NEGATIVE  NEGATIVE    Bilirubin Urine NEGATIVE  NEGATIVE    Ketones, ur 15 (*) NEGATIVE mg/dL    Protein, ur NEGATIVE  NEGATIVE mg/dL    Urobilinogen, UA 1.0  0.0 - 1.0 mg/dL    Nitrite NEGATIVE  NEGATIVE    Leukocytes, UA NEGATIVE  NEGATIVE MICROSCOPIC NOT DONE ON URINES WITH NEGATIVE PROTEIN, BLOOD, LEUKOCYTES, NITRITE, OR GLUCOSE <1000 mg/dL.  URINE CULTURE     Status: Normal (Preliminary result)   Collection Time   12/28/11  2:48 PM      Component Value Range Comment   Specimen Description URINE, CLEAN CATCH      Special Requests NONE      Culture  Setup Time [id number]      Colony Count 10,000 COLONIES/ML      Culture ESCHERICHIA COLI      Report Status PENDING        HEENT: masked  facies Cardio: RRR Resp: CTA B/L GI: BS positive Extremity:  No Edema Skin:   Intact Neuro: Confused and Abnormal Sensory dorsum R foot Musc/Skel:  Other R leg length discrepency R foot drop  Assessment/Plan: 1. Functional deficits secondary to deconditioning following SBO, parkinson's which require 3+ hours per day of interdisciplinary therapy in a comprehensive inpatient rehab setting. Physiatrist is providing close team supervision and 24 hour management of active medical problems listed below. Physiatrist and rehab team continue to assess barriers to discharge/monitor patient progress toward functional and medical goals. FIM: FIM - Bathing Bathing Steps Patient Completed: Chest;Right Arm;Left Arm;Abdomen;Front perineal area;Right upper leg;Left upper leg Bathing: 3: Mod-Patient completes 5-7 7f 10 parts or 50-74%  FIM - Upper Body Dressing/Undressing Upper body dressing/undressing steps patient completed: Thread/unthread right sleeve of pullover shirt/dresss;Thread/unthread left sleeve of pullover shirt/dress;Put head through opening of pull over shirt/dress;Pull shirt over trunk Upper body dressing/undressing: 5: Set-up assist to: Obtain clothing/put away FIM - Lower Body Dressing/Undressing Lower body dressing/undressing steps patient completed: Thread/unthread right pants leg;Thread/unthread left pants leg;Pull underwear up/down;Pull pants up/down;Don/Doff right sock;Don/Doff left sock;Don/Doff left shoe Lower body dressing/undressing: 3: Mod-Patient completed 50-74% of tasks  FIM - Toileting Toileting steps completed by patient: Adjust clothing prior to toileting  Toileting Assistive Devices: Grab bar or rail for support Toileting: 4: Steadying assist  FIM - Diplomatic Services operational officer Devices: Best boy Transfers: 4-To toilet/BSC: Min A (steadying Pt. > 75%);4-From toilet/BSC: Min A (steadying Pt. > 75%)  FIM - Bed/Chair Transfer Bed/Chair  Transfer Assistive Devices: Therapist, occupational: 4: Bed > Chair or W/C: Min A (steadying Pt. > 75%);4: Chair or W/C > Bed: Min A (steadying Pt. > 75%)  FIM - Locomotion: Wheelchair Distance: 15' with bilat UE with total verbal and hand over hand facilitation for simultaneous UE propulsion to prevent steering to R into wall Locomotion: Wheelchair: 0: Activity did not occur FIM - Locomotion: Ambulation Locomotion: Ambulation Assistive Devices: Designer, industrial/product Ambulation/Gait Assistance: 4: Min assist Locomotion: Ambulation: 2: Travels 50 - 149 ft with minimal assistance (Pt.>75%)  Comprehension Comprehension Mode: Auditory Comprehension: 4-Understands basic 75 - 89% of the time/requires cueing 10 - 24% of the time  Expression Expression Mode: Verbal Expression: 4-Expresses basic 75 - 89% of the time/requires cueing 10 - 24% of the time. Needs helper to occlude trach/needs to repeat words.  Social Interaction Social Interaction: 4-Interacts appropriately 75 - 89% of the time - Needs redirection for appropriate language or to initiate interaction.  Problem Solving Problem Solving: 2-Solves basic 25 - 49% of the time - needs direction more than half the time to initiate, plan or complete simple activities  Memory Memory: 3-Recognizes or recalls 50 - 74% of the time/requires cueing 25 - 49% of the time Medical Problem List and Plan:  1. small bowel obstruction with deconditioning as well as Parkinson's disease  2. DVT Prophylaxis/Anticoagulation: Subcutaneous heparin for DVT prophylaxis  3. Decreased nutritional storage. Diet has slowly been advanced after small bowel obstruction  4. Parkinson's disease. Sinemet as directed and continue current medication regimen  5. Dementia. Continue Exelon patch 6.  Orthostatic hypotension likely multifactorial, PD, meds (sinemt and Exelon), prolonged bedrest, TEDs and abd binder 7.  Constipation add colace 8. Urine inc dysuria - UA low colony  ct ecoli, no abx 9.  Depression and dementia, would not increase cymbalta as this may further raise resting BP LOS (Days) 8 A FACE TO FACE EVALUATION WAS PERFORMED  Anisten Tomassi E 12/30/2011, 7:02 AM

## 2011-12-30 NOTE — Progress Notes (Signed)
Occupational Therapy Weekly Progress Note And Treatment Note  Patient Details  Name: Paul Callahan MRN: [id number] Date of Birth: [birth year]  Today's Date: 12/30/2011 Time: 4696-2952 and 8413-2440 Time Calculation (min): 55 min and 28 min  Patient has met 3 of 4 short term goals.  Pt is making slow but steady progress towards goals.  He is hindered by occasional lightheadedness with position changes and orthostatic BP with position changes.  TEDS and abdominal binder in use and pt with decreased c/o lightheadedness with functional mobility.  Pt completes UB bathing, dressing, and grooming with occasional min assist approaching supervision and continues to require mod-max assist with LB dressing secondary to PD rigidity.  Pt's wife reports that she would dress him but that he was able to don pajama pants occasionally.    Patient continues to demonstrate the following deficits: decreased endurance and activity tolerance, decreased strength, decreased dynamic sitting and standing balance, lightheadedness/dizziness, impaired memory, decreased ability to complete LB bathing and dressing secondary to tone and therefore will continue to benefit from skilled OT intervention to enhance overall performance with BADL and Reduce care partner burden.  Patient not progressing toward long term goals.  See goal revision. Downgraded LB dressing goal from min assist to mod assist.  Continue plan of care.  OT Short Term Goals Week 1:  OT Short Term Goal 1 (Week 1): Pt will complete bathing at sit to stand level with min assist and min-mod verbal cues for initiation and sequencing. OT Short Term Goal 1 - Progress (Week 1): Met OT Short Term Goal 2 (Week 1): Pt will complete LB dressing with mod assist 3 out of 4 times. OT Short Term Goal 2 - Progress (Week 1): Progressing toward goal OT Short Term Goal 3 (Week 1): Pt will complete toilet transfer with min assist with LRAD OT Short Term Goal 3 - Progress (Week 1):  Met OT Short Term Goal 4 (Week 1): Pt will complete tub bench transfer with min assist OT Short Term Goal 4 - Progress (Week 1): Met Week 2:  OT Short Term Goal 1 (Week 2): STG= LTG due to short remaining LOS.  Overall min assist with exception of mod assist LB dsg  Skilled Therapeutic Interventions/Progress Updates:    1) Pt seen for ADL retraining at walk-in shower level.  Pt demonstrated increased initiation and sequencing with bathing UB and progressing to LB.  Pt requires min/steady assist in standing with bathing buttocks and assist to wash both feet.  Pt with increased initiation this session with donning underwear and assisting with donning shoes.  Pt completed grooming in standing at sink with light min/steady assist secondary to decreased balance reactions and tight RLE.  2) 1:1 OT with focus on activity tolerance and endurance with ambulation from room to therapy gym, 10 mins on nustep at level 4, and BUE strengthening with 3# dowel rod with 2 reps of 15 bicep curls and chest presses in unsupported sitting.  Pt with improved mood and engagement this session.  Therapy Documentation Precautions:  Precautions Precautions: Fall Precaution Comments: h/o lumbar surgery, dementia, Parkinson's dz Required Braces or Orthoses: Other Brace/Splint Other Brace/Splint: R AFO Restrictions Weight Bearing Restrictions: No Pain: Pain Assessment Pain Assessment: No/denies pain ADL: ADL Eating: Independent Where Assessed-Eating: Chair Grooming: Supervision/safety Where Assessed-Grooming: Standing at sink Upper Body Bathing: Supervision/safety Where Assessed-Upper Body Bathing: Shower Lower Body Bathing: Moderate assistance Where Assessed-Lower Body Bathing: Shower Upper Body Dressing: Supervision/safety Where Assessed-Upper Body Dressing: Edge of bed Lower  Body Dressing: Maximal assistance Where Assessed-Lower Body Dressing: Edge of bed Toileting: Minimal assistance Where  Assessed-Toileting: Actuary Transfer: Minimal Dentist Method: Ambulating (with RW) Acupuncturist: Grab bars;Bedside commode Film/video editor: Minimal assistance Film/video editor Method: Ambulating;Stand pivot (with RW) Astronomer: Shower seat with back ADL Comments: Pt continues to require max assist wtih LB dressing.  Pt and wife report that she and aide would assist with bathing and dress him.  See FIM for current functional status  Therapy/Group: Individual Therapy  Leonette Monarch 12/30/2011, 11:32 AM

## 2011-12-31 NOTE — Progress Notes (Signed)
Patient ID: Paul Callahan, male   DOB: [birth year], 78 y.o.   MRN: [id number]  Subjective/Complaints: Dizziness when going from lying to sitting to standing  Orthostaic drops >30-80 mmHg lying to standing. No bowel problems Was dizzy at home, no CP or SOB.  Review of Systems  Genitourinary: Positive for dysuria, urgency and frequency. Negative for hematuria and flank pain.  Neurological: Positive for focal weakness.  Psychiatric/Behavioral: Positive for memory loss.  All other systems reviewed and are negative.  Objective: Vital Signs: Blood pressure 166/73, pulse 79, temperature 98.6 F (37 C), temperature source Oral, resp. rate 17, height 5\' 7"  (1.702 m), weight 78.019 kg (172 lb), SpO2 95.00%. No results found. Results for orders placed during the hospital encounter of 12/22/11 (from the past 72 hour(s))  URINALYSIS, ROUTINE W REFLEX MICROSCOPIC     Status: Abnormal   Collection Time   12/28/11  2:48 PM      Component Value Range Comment   Color, Urine YELLOW  YELLOW    APPearance CLEAR  CLEAR    Specific Gravity, Urine 1.020  1.005 - 1.030    pH 5.0  5.0 - 8.0    Glucose, UA NEGATIVE  NEGATIVE mg/dL    Hgb urine dipstick NEGATIVE  NEGATIVE    Bilirubin Urine NEGATIVE  NEGATIVE    Ketones, ur 15 (*) NEGATIVE mg/dL    Protein, ur NEGATIVE  NEGATIVE mg/dL    Urobilinogen, UA 1.0  0.0 - 1.0 mg/dL    Nitrite NEGATIVE  NEGATIVE    Leukocytes, UA NEGATIVE  NEGATIVE MICROSCOPIC NOT DONE ON URINES WITH NEGATIVE PROTEIN, BLOOD, LEUKOCYTES, NITRITE, OR GLUCOSE <1000 mg/dL.  URINE CULTURE     Status: Normal   Collection Time   12/28/11  2:48 PM      Component Value Range Comment   Specimen Description URINE, CLEAN CATCH      Special Requests NONE      Culture  Setup Time 12/28/2011 15:55      Colony Count 10,000 COLONIES/ML      Culture ESCHERICHIA COLI      Report Status 12/30/2011 FINAL      Organism ID, Bacteria ESCHERICHIA COLI        HEENT: masked facies Cardio: RRR Resp: CTA  B/L GI: BS positive Extremity:  No Edema Skin:   Intact Neuro: Confused and Abnormal Sensory dorsum R foot Musc/Skel:  Other R leg length discrepency R foot drop  Assessment/Plan: 1. Functional deficits secondary to deconditioning following SBO, parkinson's which require 3+ hours per day of interdisciplinary therapy in a comprehensive inpatient rehab setting. Physiatrist is providing close team supervision and 24 hour management of active medical problems listed below. Physiatrist and rehab team continue to assess barriers to discharge/monitor patient progress toward functional and medical goals. FIM: FIM - Bathing Bathing Steps Patient Completed: Chest;Right Arm;Left Arm;Abdomen;Front perineal area;Buttocks;Right upper leg;Left upper leg Bathing: 4: Min-Patient completes 8-9 67f 10 parts or 75+ percent  FIM - Upper Body Dressing/Undressing Upper body dressing/undressing steps patient completed: Thread/unthread right sleeve of pullover shirt/dresss;Thread/unthread left sleeve of pullover shirt/dress;Put head through opening of pull over shirt/dress;Pull shirt over trunk Upper body dressing/undressing: 5: Set-up assist to: Obtain clothing/put away FIM - Lower Body Dressing/Undressing Lower body dressing/undressing steps patient completed: Thread/unthread right underwear leg;Thread/unthread left underwear leg;Pull underwear up/down;Pull pants up/down;Don/Doff left shoe Lower body dressing/undressing: 3: Mod-Patient completed 50-74% of tasks  FIM - Toileting Toileting steps completed by patient: Adjust clothing prior to toileting Toileting Assistive Devices: Grab  bar or rail for support Toileting: 4: Steadying assist  FIM - Diplomatic Services operational officer Devices: Best boy Transfers: 4-To toilet/BSC: Min A (steadying Pt. > 75%);4-From toilet/BSC: Min A (steadying Pt. > 75%)  FIM - Bed/Chair Transfer Bed/Chair Transfer Assistive Devices: Manufacturing systems engineer  Transfer: 3: Supine > Sit: Mod A (lifting assist/Pt. 50-74%/lift 2 legs;3: Sit > Supine: Mod A (lifting assist/Pt. 50-74%/lift 2 legs);4: Bed > Chair or W/C: Min A (steadying Pt. > 75%);4: Chair or W/C > Bed: Min A (steadying Pt. > 75%)  FIM - Locomotion: Wheelchair Distance: 15' with bilat UE with total verbal and hand over hand facilitation for simultaneous UE propulsion to prevent steering to R into wall Locomotion: Wheelchair: 0: Activity did not occur FIM - Locomotion: Ambulation Locomotion: Ambulation Assistive Devices: Designer, industrial/product Ambulation/Gait Assistance: 4: Min assist Locomotion: Ambulation: 2: Travels 50 - 149 ft with minimal assistance (Pt.>75%)  Comprehension Comprehension Mode: Auditory Comprehension: 4-Understands basic 75 - 89% of the time/requires cueing 10 - 24% of the time  Expression Expression Mode: Verbal Expression: 4-Expresses basic 75 - 89% of the time/requires cueing 10 - 24% of the time. Needs helper to occlude trach/needs to repeat words.  Social Interaction Social Interaction: 4-Interacts appropriately 75 - 89% of the time - Needs redirection for appropriate language or to initiate interaction.  Problem Solving Problem Solving: 2-Solves basic 25 - 49% of the time - needs direction more than half the time to initiate, plan or complete simple activities  Memory Memory: 3-Recognizes or recalls 50 - 74% of the time/requires cueing 25 - 49% of the time Medical Problem List and Plan:  1. small bowel obstruction with deconditioning as well as Parkinson's disease  2. DVT Prophylaxis/Anticoagulation: Subcutaneous heparin for DVT prophylaxis  3. Decreased nutritional storage. Diet has slowly been advanced after small bowel obstruction  4. Parkinson's disease. Sinemet as directed and continue current medication regimen  5. Dementia. Continue Exelon patch 6.  Orthostatic hypotension likely multifactorial, PD, meds (sinemt and Exelon), prolonged bedrest, TEDs and  abd binder 7.  Constipation add colace 8. Urine inc dysuria - UA low colony ct ecoli, no abx 9.  Depression and dementia, would not increase cymbalta as this may further raise resting BP LOS (Days) 9 A FACE TO FACE EVALUATION WAS PERFORMED  Bodi Palmeri E 12/31/2011, 10:01 AM

## 2011-12-31 NOTE — Progress Notes (Signed)
Occupational Therapy Session Note  Patient Details  Name: Paul Callahan MRN: [id number] Date of Birth: 1923/11/30  Today's Date: 12/31/2011 Time: 7846-9629 Time Calculation (min): 55 min   Skilled Therapeutic Interventions/Progress Updates:    1:1 Therapeutic activity: Addressing lateral weight shifts and hip mobility in sitting (more difficulty on right than left) with reaching for rings outside of BOS side to side, performing PNF patterns in sitting with ball to address transitional mobility in core and UB and more trunk extension, sit to supine to perform ROM for neck achieving not many degrees, rotation of hips for stretch in IT band and hip extensors to aid in upright posture for ADL functional tasks. Sit to stand with left LE on an elevated surface to achieve full weight bearing through right LE with heel down to the floor. Practiced donning and doffing shoes with AFO- able to perform with supervision. Also practiced tub bench transfer in ADL apartment with mod A to get both LEs inside tub and min cuing to remember to sit before putting LEs in. Min A to come out of tub. Pt with increased upright posture with functional ambulation with Rollator back to his room with mod tactile cues for upright posture compared to first ambulating to the gym.   Therapy Documentation Precautions:  Precautions Precautions: Fall Precaution Comments: h/o lumbar surgery, dementia, Parkinson's dz Required Braces or Orthoses: Other Brace/Splint Other Brace/Splint: R AFO Restrictions Weight Bearing Restrictions: No Pain: Pain Assessment Pain Assessment: No/denies pain  See FIM for current functional status  Therapy/Group: Individual Therapy  Roney Mans [facility] 12/31/2011, 2:31 PM

## 2011-12-31 NOTE — Progress Notes (Signed)
Physical Therapy Session Note  Patient Details  Name: Paul Callahan MRN: [id number] Date of Birth: Feb 16, 1924  Today's Date: 12/31/2011 Time: 0830-0900 Time Calculation (min): 30 min  Short Term Goals: Week 1:  PT Short Term Goal 1 (Week 1): = LTG  Therapy Documentation Precautions:  Precautions Precautions: Fall Precaution Comments: h/o lumbar surgery, dementia, Parkinson's dz Required Braces or Orthoses: Other Brace/Splint Other Brace/Splint: R AFO Restrictions Weight Bearing Restrictions: No Pain: Pain Assessment Pain Assessment: No/denies pain; following rolling in bed and sup > sit EOB patient reports that the room was "standing still"; no c/o dizziness Mobility:  Assisted patient with donning of pants with multiple bridges in supine, TEDs and abdominal binder through L and R rolling in flat bed with min A and verbal cues for sequence; sup > sit EOB with mod A to bring trunk fully upright and laterally weight shift.  No c/o dizziness.   Locomotion : Ambulation Ambulation/Gait Assistance: 4: Min assist side stepping to R with bilat UE support on wall rail with focus on upright gaze and trunk, active R hip ABD, heel strike with activation of RLE extensors, R lateral weight shift during LLE step together with mod A for R foot placement and tactile cues at quad and glutes.  R single limb stance with bilat UE support on wall rail with tactile cues at glute and quad for extensor activation in stance; little carry over to stance phase during gait with RW  Other Treatments:  To prepare RLE/hip and trunk for transfers and gait positioned RLE in neutral hip ER/IR with foot on floor during R lateral and forward reaches out of BOS for R lateral weight shift onto pelvis and R rib active elongation and trunk extension x 8 reps.  See FIM for current functional status  Therapy/Group: Individual Therapy  Edman Circle [facility] 12/31/2011, 11:43 AM

## 2012-01-01 NOTE — Progress Notes (Signed)
Observed patient standing at recliner. Patient had removed quick release belt. A & O to self. Right lateral heel slightly red. Elevated after assisting back to bed.Paul Callahan

## 2012-01-01 NOTE — Progress Notes (Signed)
Patient ID: Paul Callahan, male   DOB: [birth year], 78 y.o.   MRN: [id number]  Subjective/Complaints: Dizziness when going from lying to sitting to standing  Orthostaic drops >30-80 mmHg lying to standing. No bowel problems Was dizzy at home, no CP or SOB.  Review of Systems  Genitourinary: Positive for dysuria, urgency and frequency. Negative for hematuria and flank pain.  Neurological: Positive for focal weakness.  Psychiatric/Behavioral: Positive for memory loss.  All other systems reviewed and are negative.  Objective: Vital Signs: Blood pressure 118/68, pulse 84, temperature 98.9 F (37.2 C), temperature source Oral, resp. rate 18, height 5\' 7"  (1.702 m), weight 78.019 kg (172 lb), SpO2 95.00%. No results found. No results found for this or any previous visit (from the past 72 hour(s)).   HEENT: masked facies Cardio: RRR Resp: CTA B/L GI: BS positive Extremity:  No Edema Skin:   Intact Neuro: Confused and Abnormal Sensory dorsum R foot Musc/Skel:  Other R leg length discrepency R foot drop 0/5 ankle DF on R, 4/5 on L  Assessment/Plan: 1. Functional deficits secondary to deconditioning following SBO, parkinson's which require 3+ hours per day of interdisciplinary therapy in a comprehensive inpatient rehab setting. Physiatrist is providing close team supervision and 24 hour management of active medical problems listed below. Physiatrist and rehab team continue to assess barriers to discharge/monitor patient progress toward functional and medical goals. FIM: FIM - Bathing Bathing Steps Patient Completed: Chest;Right Arm;Left Arm;Abdomen;Front perineal area;Buttocks;Right lower leg (including foot);Left upper leg;Right upper leg;Left lower leg (including foot) Bathing: 4: Min-Patient completes 8-9 66f 10 parts or 75+ percent  FIM - Upper Body Dressing/Undressing Upper body dressing/undressing steps patient completed: Thread/unthread right sleeve of pullover  shirt/dresss;Thread/unthread left sleeve of pullover shirt/dress;Put head through opening of pull over shirt/dress Upper body dressing/undressing: 5: Set-up assist to: Obtain clothing/put away FIM - Lower Body Dressing/Undressing Lower body dressing/undressing steps patient completed: Fasten/unfasten pants;Pull pants up/down;Thread/unthread left pants leg;Pull underwear up/down;Thread/unthread right pants leg;Thread/unthread left underwear leg;Thread/unthread right underwear leg Lower body dressing/undressing: 4: Min-Patient completed 75 plus % of tasks  FIM - Toileting Toileting steps completed by patient: Adjust clothing prior to toileting;Performs perineal hygiene;Adjust clothing after toileting Toileting Assistive Devices: Grab bar or rail for support Toileting:  (groggy from meds requiring 2 person assist)  FIM - Diplomatic Services operational officer Devices: Walker;Grab bars Toilet Transfers: 4-From toilet/BSC: Min A (steadying Pt. > 75%)  FIM - Bed/Chair Transfer Bed/Chair Transfer Assistive Devices: Walker;Orthosis Bed/Chair Transfer: 3: Supine > Sit: Mod A (lifting assist/Pt. 50-74%/lift 2 legs;4: Chair or W/C > Bed: Min A (steadying Pt. > 75%);4: Bed > Chair or W/C: Min A (steadying Pt. > 75%)  FIM - Locomotion: Wheelchair Distance: 15' with bilat UE with total verbal and hand over hand facilitation for simultaneous UE propulsion to prevent steering to R into wall Locomotion: Wheelchair: 1: Travels less than 50 ft with minimal assistance (Pt.>75%) FIM - Locomotion: Ambulation Locomotion: Ambulation Assistive Devices: Walker - Rolling;Orthosis Ambulation/Gait Assistance: 4: Min guard Locomotion: Ambulation: 1: Travels less than 50 ft with minimal assistance (Pt.>75%)  Comprehension Comprehension Mode: Auditory Comprehension: 4-Understands basic 75 - 89% of the time/requires cueing 10 - 24% of the time  Expression Expression Mode: Verbal Expression: 4-Expresses basic 75  - 89% of the time/requires cueing 10 - 24% of the time. Needs helper to occlude trach/needs to repeat words.  Social Interaction Social Interaction: 4-Interacts appropriately 75 - 89% of the time - Needs redirection for appropriate language or to  initiate interaction.  Problem Solving Problem Solving: 2-Solves basic 25 - 49% of the time - needs direction more than half the time to initiate, plan or complete simple activities  Memory Memory: 2-Recognizes or recalls 25 - 49% of the time/requires cueing 51 - 75% of the time Medical Problem List and Plan:  1. small bowel obstruction with deconditioning as well as Parkinson's disease  2. DVT Prophylaxis/Anticoagulation: Subcutaneous heparin for DVT prophylaxis  3. Decreased nutritional storage. Diet has slowly been advanced after small bowel obstruction  4. Parkinson's disease. Sinemet as directed and continue current medication regimen  5. Dementia. Continue Exelon patch 6.  Orthostatic hypotension likely multifactorial, PD, meds (sinemt and Exelon), prolonged bedrest, TEDs and abd binder 7.  Constipation add colace 8. Urine inc dysuria - UA low colony ct ecoli, no abx 9.  Depression and dementia, would not increase cymbalta as this may further raise resting BP LOS (Days) 10 A FACE TO FACE EVALUATION WAS PERFORMED  Elnora Quizon E 01/01/2012, 9:54 AM

## 2012-01-01 NOTE — Progress Notes (Signed)
Physical Therapy Session Note  Patient Details  Name: Paul Callahan MRN: [id number] Date of Birth: 22-Feb-1924  Today's Date: 01/01/2012 Time: 4098-1191 Time Calculation (min): 45 min  Short Term Goals: Week 1:  PT Short Term Goal 1 (Week 1): = LTG  Skilled Therapeutic Interventions/Progress Updates:    Passive and active stretching to LE's (tight hip rotators and adductors), stand against wall trying to reach full extension while working on phonation by counting out loud, steps up to increase hip extensor strength to aid posture and for fall prevention; therapeutic activity addressing bed mobility on regular bed; gait training with 1 cue to slow down- knees do not fully extend especially R; problem solving deficits noted  Therapy Documentation Precautions:  Precautions Precautions: Fall Precaution Comments: h/o lumbar surgery, dementia, Parkinson's dz Required Braces or Orthoses: Other Brace/Splint Other Brace/Splint: R AFO Restrictions Weight Bearing Restrictions: No  HR 96 with step ups Pain: Pain Assessment Pain Assessment: No/denies pain Mobility: Bed Mobility Rolling Right: 5: Supervision Right Sidelying to Sit: 4: Min assist (second trial close S on regular bed) Sit to Supine: 5: Supervision Transfers Sit to Stand: 5: Supervision Stand to Sit: With upper extremity assist;5: Supervision Locomotion : Ambulation Ambulation/Gait Assistance: 5: Supervision Ambulation Distance (Feet): 170 Feet Assistive device: 4-wheeled walker  Trunk/Postural Assessment :   kyphotic with forward head   Other Treatments: Treatments Therapeutic Activity: step ups on 6 inch step 10 x 2with rails  See FIM for current functional status  Therapy/Group: Individual Therapy  Michaelene Song 01/01/2012, 3:20 PM

## 2012-01-02 DIAGNOSIS — Z5189 Encounter for other specified aftercare: Secondary | ICD-10-CM

## 2012-01-02 DIAGNOSIS — G20 Parkinson's disease: Secondary | ICD-10-CM

## 2012-01-02 DIAGNOSIS — R5381 Other malaise: Secondary | ICD-10-CM

## 2012-01-02 NOTE — Progress Notes (Signed)
Patient ID: Paul Callahan, male   DOB: Jun 12, 1924, 78 y.o.   MRN: [id number]  Subjective/Complaints: Dizziness when going from lying to sitting to standing  Orthostaic drops >30-80 mmHg lying to standing.Overall improving as pt is being mobilized No bowel problems Was dizzy at home, no CP or SOB.  Review of Systems  Genitourinary: Positive for dysuria, urgency and frequency. Negative for hematuria and flank pain.  Neurological: Positive for focal weakness.  Psychiatric/Behavioral: Positive for memory loss.  All other systems reviewed and are negative.  Objective: Vital Signs: Blood pressure 111/68, pulse 78, temperature 98.2 F (36.8 C), temperature source Oral, resp. rate 18, height 5\' 7"  (1.702 m), weight 78.019 kg (172 lb), SpO2 95.00%. No results found. No results found for this or any previous visit (from the past 72 hour(s)).   HEENT: masked facies Cardio: RRR Resp: CTA B/L GI: BS positive Extremity:  No Edema Skin:   Intact Neuro: Confused and Abnormal Sensory dorsum R foot Musc/Skel:  Other R leg length discrepency R foot drop 0/5 ankle DF on R, 4/5 on L  Assessment/Plan: 1. Functional deficits secondary to deconditioning following SBO, parkinson's which require 3+ hours per day of interdisciplinary therapy in a comprehensive inpatient rehab setting. Physiatrist is providing close team supervision and 24 hour management of active medical problems listed below. Physiatrist and rehab team continue to assess barriers to discharge/monitor patient progress toward functional and medical goals. FIM: FIM - Bathing Bathing Steps Patient Completed: Chest;Right Arm;Left Arm;Abdomen;Front perineal area;Buttocks;Right upper leg;Left upper leg;Right lower leg (including foot);Left lower leg (including foot) Bathing: 4: Steadying assist  FIM - Upper Body Dressing/Undressing Upper body dressing/undressing steps patient completed: Thread/unthread right sleeve of pullover  shirt/dresss;Thread/unthread left sleeve of pullover shirt/dress;Put head through opening of pull over shirt/dress Upper body dressing/undressing: 4: Min-Patient completed 75 plus % of tasks FIM - Lower Body Dressing/Undressing Lower body dressing/undressing steps patient completed: Thread/unthread right underwear leg;Thread/unthread left underwear leg;Pull underwear up/down;Thread/unthread right pants leg;Pull pants up/down Lower body dressing/undressing: 4: Min-Patient completed 75 plus % of tasks  FIM - Toileting Toileting steps completed by patient: Adjust clothing prior to toileting;Performs perineal hygiene;Adjust clothing after toileting Toileting Assistive Devices: Grab bar or rail for support Toileting:  (groggy from meds requiring 2 person assist)  FIM - Diplomatic Services operational officer Devices: Walker;Grab bars Toilet Transfers: 4-From toilet/BSC: Min A (steadying Pt. > 75%)  FIM - Bed/Chair Transfer Bed/Chair Transfer Assistive Devices: Walker;Orthosis Bed/Chair Transfer: 4: Chair or W/C > Bed: Min A (steadying Pt. > 75%);4: Bed > Chair or W/C: Min A (steadying Pt. > 75%);3: Sit > Supine: Mod A (lifting assist/Pt. 50-74%/lift 2 legs);3: Supine > Sit: Mod A (lifting assist/Pt. 50-74%/lift 2 legs  FIM - Locomotion: Wheelchair Distance: 15' with bilat UE with total verbal and hand over hand facilitation for simultaneous UE propulsion to prevent steering to R into wall Locomotion: Wheelchair: 0: Activity did not occur FIM - Locomotion: Ambulation Locomotion: Ambulation Assistive Devices:  (rollator) Ambulation/Gait Assistance: 5: Supervision Locomotion: Ambulation: 5: Travels 150 ft or more with supervision/safety issues  Comprehension Comprehension Mode: Auditory Comprehension: 4-Understands basic 75 - 89% of the time/requires cueing 10 - 24% of the time  Expression Expression Mode: Verbal Expression: 4-Expresses basic 75 - 89% of the time/requires cueing 10 - 24%  of the time. Needs helper to occlude trach/needs to repeat words.  Social Interaction Social Interaction: 4-Interacts appropriately 75 - 89% of the time - Needs redirection for appropriate language or to initiate interaction.  Problem Solving Problem Solving: 2-Solves basic 25 - 49% of the time - needs direction more than half the time to initiate, plan or complete simple activities  Memory Memory: 2-Recognizes or recalls 25 - 49% of the time/requires cueing 51 - 75% of the time Medical Problem List and Plan:  1. small bowel obstruction with deconditioning as well as Parkinson's disease  2. DVT Prophylaxis/Anticoagulation: Subcutaneous heparin for DVT prophylaxis  3. Decreased nutritional storage. Diet has slowly been advanced after small bowel obstruction  4. Parkinson's disease. Sinemet as directed and continue current medication regimen  5. Dementia. Continue Exelon patch 6.  Orthostatic hypotension likely multifactorial, PD, meds (sinemt and Exelon), prolonged bedrest no tolerating upright position better, TEDs and abd binder 7.  Constipation add colace 8. Urine inc dysuria - UA low colony ct ecoli, no abx 9.  Depression and dementia, would not increase cymbalta as this may further raise resting BP LOS (Days) 11 A FACE TO FACE EVALUATION WAS PERFORMED  Wymon Swaney E 01/02/2012, 8:43 AM

## 2012-01-02 NOTE — Progress Notes (Signed)
Stands at edge of bed to void with staff holding urinal. LBM= 01/01/12. Right lateral heel red and trying to keep elevated off bed. Called for assistance during night.Tawanna Solo

## 2012-01-02 NOTE — Progress Notes (Signed)
Per State Regulation 482.30 This chart was reviewed for medical necessity with respect to the patient's Admission/Duration of stay. Participating in therapies with good progress toward goals. Meryl Dare                 Nurse Care Manager            Next Review Date: 01/06/12

## 2012-01-02 NOTE — Progress Notes (Signed)
Physical Therapy Session Note  Patient Details  Name: Paul Callahan MRN: [id number] Date of Birth: Jun 06, 1924  Today's Date: 01/02/2012 Time: 1108-1202 Time Calculation (min): 54 min  Short Term Goals: Week 1:  PT Short Term Goal 1 (Week 1): = LTG  Therapy Documentation Precautions:  Precautions Precautions: Fall Precaution Comments: h/o lumbar surgery, dementia, Parkinson's dz Required Braces or Orthoses: Other Brace/Splint Other Brace/Splint: R AFO Restrictions Weight Bearing Restrictions: No Pain: Pain Assessment Pain Assessment: No/denies pain Pain Score: 0-No pain Locomotion : Ambulation Ambulation/Gait Assistance: 5: Supervision-min A during gait in controlled environment with 4WW with improved step and stride length today and improved upright posture; performed gait training up and down ramp x 3 reps with 4WW and min A with verbal cues to maintain upright posture and safe distance to RW to prevent forward LOB.    Gait training with // bars and use of visual cue on floor for wider BOS to facilitate more R hip ABD and R lateral weight shift with min A for full weight shift; use of 1/2 block under R foot (to equalize leg length discrepancy) with focus on R stance phase terminal hip and knee extension against resistance of green theraband around knee and then during LLE forward and retro stepping and then during RLE step ups onto 4" step to focus on full hip/knee extension and anterior weight shift of COG; multiple rest breaks due to RLE fatigue.  See FIM for current functional status  Therapy/Group: Individual Therapy  Edman Circle [facility] 01/02/2012, 12:36 PM

## 2012-01-02 NOTE — Progress Notes (Signed)
Occupational Therapy Session Note  Patient Details  Name: Paul Callahan MRN: [id number] Date of Birth: 06/06/1924  Today's Date: 01/02/2012 Time: 4098-1191 and 4782-9562 Time Calculation (min): 57 min and 28 min  Short Term Goals: Week 2:  OT Short Term Goal 1 (Week 2): STG= LTG due to short remaining LOS.  Overall min assist with exception of mod assist LB dsg  Skilled Therapeutic Interventions/Progress Updates:    1) Pt seen for ADL retraining with focus on functional mobility with 4 wheeled RW, walk-in shower transfer, standing tolerance, and encouraged weight shifting through RLE in standing.  Pt overall min assist with ambulation and bathing with occasional steady assist secondary to decreased weight bearing through RLE due to tone.  Pt attempted to don Rt shoe with AFO, but required assistance secondary to straps getting stuck.  Engaged in PNF reaching in standing with cues to weight shift over RLE to promote additional weight bearing and increase standing balance and stability.  2) 1:1 OT with focus on functional mobility, BUE strengthening, and dynamic sitting balance.  Pt overall close supervision with mobility with min cues for upright posture.  2# medicine ball with horizontal abduction and adduction in unsupported sitting and reaching in various planes in sitting and standing to encourage weight shifting and balance reactions.  Therapy Documentation Precautions:  Precautions Precautions: Fall Precaution Comments: h/o lumbar surgery, dementia, Parkinson's dz Required Braces or Orthoses: Other Brace/Splint Other Brace/Splint: R AFO Restrictions Weight Bearing Restrictions: No Pain: Pain Assessment Pain Assessment: No/denies pain Pain Score: 0-No pain  See FIM for current functional status  Therapy/Group: Individual Therapy  Leonette Monarch 01/02/2012, 12:26 PM

## 2012-01-03 MED ORDER — DSS 100 MG PO CAPS: 100.0000 mg | ORAL_CAPSULE | Freq: Two times a day (BID) | ORAL | Status: AC

## 2012-01-03 NOTE — Progress Notes (Signed)
Social Work Patient ID: Paul Callahan, male   DOB: Feb 23, 1924, 79 y.o.   MRN: [id number] Wife contacted Dan-Pa and requested discharge today or tomorrow due to she feels pt is becoming depressed here. Team feels once family education completed he would be ready and MD agreeable.  Discussed follow up with wife pt has been to  OP rehab PTA and would like to resume.  Referral made to OP and appt obtained-7/3 12:30 pm for OPPT. No other needs, pt has 24 hour care at home.  Wife pleased with discharge and flexability of rehab team.

## 2012-01-03 NOTE — Progress Notes (Signed)
Patient ID: Paul Callahan, male   DOB: 01-20-1924, 78 y.o.   MRN: [id number]  Subjective/Complaints: Dizziness when going from lying to sitting to standing  Orthostaic drops >30-80 mmHg lying to standing.Overall improving as pt is being mobilized No bowel problems Was dizzy at home, no CP or SOB.  Review of Systems  Genitourinary: Positive for dysuria, urgency and frequency. Negative for hematuria and flank pain.  Neurological: Positive for focal weakness.  Psychiatric/Behavioral: Positive for memory loss.  All other systems reviewed and are negative.  Objective: Vital Signs: Blood pressure 110/72, pulse 88, temperature 98.4 F (36.9 C), temperature source Oral, resp. rate 24, height 5\' 7"  (1.702 m), weight 78.019 kg (172 lb), SpO2 95.00%. No results found. No results found for this or any previous visit (from the past 72 hour(s)).   HEENT: masked facies Cardio: RRR Resp: CTA B/L GI: BS positive Extremity:  No Edema Skin:   Intact Neuro: Confused and Abnormal Sensory dorsum R foot Musc/Skel:  Other R leg length discrepency R foot drop 0/5 ankle DF on R, 4/5 on L  Assessment/Plan: 1. Functional deficits secondary to deconditioning following SBO, parkinson's which require 3+ hours per day of interdisciplinary therapy in a comprehensive inpatient rehab setting. Physiatrist is providing close team supervision and 24 hour management of active medical problems listed below. Physiatrist and rehab team continue to assess barriers to discharge/monitor patient progress toward functional and medical goals. FIM: FIM - Bathing Bathing Steps Patient Completed: Chest;Right Arm;Left Arm;Abdomen;Front perineal area;Buttocks;Right upper leg;Left upper leg Bathing: 4: Min-Patient completes 8-9 51f 10 parts or 75+ percent  FIM - Upper Body Dressing/Undressing Upper body dressing/undressing steps patient completed: Thread/unthread right sleeve of pullover shirt/dresss;Thread/unthread left sleeve of  pullover shirt/dress;Put head through opening of pull over shirt/dress;Pull shirt over trunk Upper body dressing/undressing: 5: Set-up assist to: Obtain clothing/put away FIM - Lower Body Dressing/Undressing Lower body dressing/undressing steps patient completed: Thread/unthread left underwear leg;Pull underwear up/down;Thread/unthread left pants leg;Pull pants up/down;Don/Doff left shoe;Fasten/unfasten left shoe Lower body dressing/undressing: 3: Mod-Patient completed 50-74% of tasks  FIM - Toileting Toileting steps completed by patient: Adjust clothing prior to toileting Toileting Assistive Devices: Grab bar or rail for support Toileting: 4: Steadying assist  FIM - Diplomatic Services operational officer Devices: Best boy Transfers: 4-From toilet/BSC: Min A (steadying Pt. > 75%)  FIM - Bed/Chair Transfer Bed/Chair Transfer Assistive Devices: Therapist, occupational: 5: Bed > Chair or W/C: Supervision (verbal cues/safety issues);5: Chair or W/C > Bed: Supervision (verbal cues/safety issues)  FIM - Locomotion: Wheelchair Distance: 15' with bilat UE with total verbal and hand over hand facilitation for simultaneous UE propulsion to prevent steering to R into wall Locomotion: Wheelchair: 0: Activity did not occur FIM - Locomotion: Ambulation Locomotion: Ambulation Assistive Devices: Designer, industrial/product Ambulation/Gait Assistance: 5: Supervision Locomotion: Ambulation: 5: Travels 150 ft or more with supervision/safety issues  Comprehension Comprehension Mode: Auditory Comprehension: 4-Understands basic 75 - 89% of the time/requires cueing 10 - 24% of the time  Expression Expression Mode: Verbal Expression: 4-Expresses basic 75 - 89% of the time/requires cueing 10 - 24% of the time. Needs helper to occlude trach/needs to repeat words.  Social Interaction Social Interaction: 4-Interacts appropriately 75 - 89% of the time - Needs redirection for appropriate language or  to initiate interaction.  Problem Solving Problem Solving: 2-Solves basic 25 - 49% of the time - needs direction more than half the time to initiate, plan or complete simple activities  Memory Memory: 2-Recognizes or  recalls 25 - 49% of the time/requires cueing 51 - 75% of the time Medical Problem List and Plan:  1. small bowel obstruction with deconditioning as well as Parkinson's disease  2. DVT Prophylaxis/Anticoagulation: Subcutaneous heparin for DVT prophylaxis  3. Decreased nutritional storage. Diet has slowly been advanced after small bowel obstruction  4. Parkinson's disease. Sinemet as directed and continue current medication regimen  5. Dementia. Continue Exelon patch 6.  Orthostatic hypotension likely multifactorial, PD, meds (sinemt and Exelon), prolonged bedrest no tolerating upright position better, TEDs and abd binder 7.  Constipation add colace 8. Urine inc dysuria - UA low colony ct ecoli, no abx 9.  Depression and dementia, would not increase cymbalta as this may further raise resting BP LOS (Days) 12 A FACE TO FACE EVALUATION WAS PERFORMED  Zak Gondek E 01/03/2012, 7:09 AM

## 2012-01-03 NOTE — Progress Notes (Signed)
Pt discharged at 1200. Education given by PA and RN staff to wife and pt. All belongings are with family. No further questions at this time.

## 2012-01-03 NOTE — Discharge Summary (Signed)
NAMEJOSEMANUEL, EAKINS NO.:  [id number]  MEDICAL RECORD NO.:  [id number]  LOCATION:  4140                         FACILITY:  [facility]  PHYSICIAN:  Erick Colace, M.D.DATE OF BIRTH:  06/26/1924  DATE OF ADMISSION:  12/22/2011 DATE OF DISCHARGE:  01/03/2012                              DISCHARGE SUMMARY   DISCHARGE DIAGNOSES: 1. Small bowel obstruction with deconditioning. 2. Parkinson disease. 3. Subcutaneous heparin for DVT prophylaxis. 4. Decreased nutritional storage. 5. Dementia.  This is an 78 year old right-handed male with history of Parkinson disease as well as colon cancer with resection and did receive inpatient rehab services in 2009 after back surgery.  The patient required some assistance with dressing and bathing at home, but otherwise was independent using an assistive device.  The patient does have a chronic right foot drop following back surgery in 2009 and uses an AFO brace. He was admitted on December 16, 2011, with abdominal pain with associated nausea, vomiting.  X-rays and imaging revealed partial small bowel obstruction likely due to adhesions from previous partial colectomy.  A nasogastric tube was initially placed.  General Surgery consulted with conservative care.  Latest followup CT of the abdomen showed partial small bowel obstruction.  His diet was slowly advanced.  Nasogastric tube had been removed.  Subcutaneous heparin for DVT prophylaxis.  Bouts of delirium, sundowning with noted history of dementia.  He was admitted for comprehensive rehab program.  PAST MEDICAL HISTORY:  See discharge diagnoses.  SOCIAL HISTORY:  Lives with wife and assistance as needed.  Functional history.  Prior to admission was independent, using an assistive device.  Functional history upon admission to rehab services was minimum to moderate assist for functional mobility.  PHYSICAL EXAMINATION:  VITAL SIGNS:  Blood pressure 110/72, pulse  71, respirations 19, temperature 98.1. GENERAL:  This is an alert male, no acute distress.  He was a poor historian.  Noted some delay in processing.  He could state his age and place.  He followed basic commands. LUNGS:  Clear to auscultation. CARDIAC:  Regular rate and rhythm. ABDOMEN:  Soft, nontender.  Good bowel sounds.  Motor strength was 4-/5 bilateral deltoids, triceps and biceps, 3- bilateral hip flexors, knee extensors, 2- right ankle dorsiflexors and 3- in the left ankle dorsiflexors.  REHABILITATION HOSPITAL COURSE:  The patient was admitted to inpatient rehab services with therapies initiated on a 3-hour daily basis consisting of physical therapy, occupational therapy, and rehabilitation nursing.  The following issues were addressed during the patient's rehabilitation stay.  Pertaining to Mr. Phillips small bowel obstruction with deconditioning, he continued to progress nicely.  His bowel program was established.  He had no nausea or vomiting.  Abdomen was soft, nontender.  Good bowel sounds.  He would follow up with General Surgery Dr. Violeta Gelinas as needed.  He remained on Sinemet as advised for history of Parkinson disease.  Subcutaneous heparin was used for DVT prophylaxis throughout his rehab course.  His diet had been advanced to regular which he was tolerating nicely.  He remained on an Exelon patch for history of dementia.  He was at his cognitive baseline as per family.  The patient received  weekly collaborative interdisciplinary team conferences to discuss estimated length of stay, family teaching, and any barriers to discharge.  He was continent of bowel and bladder. His endurance remained limited, however he was progressing nicely. Required setup for upper body bathing and dressing, moderate assist lower body bathing, total assist lower body dressing, minimal assist toilet transfers, minimum to moderate assist overall for mobility.  Full family teaching was  completed.  All issues were discussed with his wife on issues of discharge to home, which they continued to request.  His discharge had been actually set for January 05, 2012, but he progressed nicely.  Full family teaching was completed.  Family requested discharge to home January 03, 2012, which was medically stable and discharge took place at that time.  DISCHARGE MEDICATIONS:  At the time of dictation included Tylenol as needed, Sinemet 25-100, 3 tabs 3 times daily and 1 tablet at bedtime, Cymbalta 60 mg daily, Exelon patch 9.5 mg daily.  DIET:  Regular.  SPECIAL INSTRUCTIONS:  Continue therapies as advised per [employer]. Follow up Dr. [name], appointment made.  Dr. Willene Hatchet, General Surgery, call for appointment.  Dr. Claudette Laws the outpatient rehab center as needed.     Mariam Dollar, P.A.   ______________________________ Erick Colace, M.D.    DA/MEDQ  D:  01/03/2012  T:  01/03/2012  Job:  [id number]  cc:   [name], M.D. Erick Colace, M.D. Gabrielle Dare Janee Morn, M.D.
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
## 2012-01-03 NOTE — Care Management Note (Signed)
Pt and spouse requesting for pt to be discharged home today due to pt feeling increasingly depressed in hospital. Team and Dr Wynn Banker in agreement. Plan for pt to complete therapies today with wife present for education.

## 2012-01-03 NOTE — Discharge Instructions (Signed)
Inpatient Rehab Discharge Instructions  YOCHANAN EDDLEMAN Discharge date and time: No discharge date for patient encounter.   Activities/Precautions/ Functional Status: Activity: activity as tolerated Diet: regular diet Wound Care: none needed Functional status:  ___ No restrictions     ___ Walk up steps independently _x__ 24/7 supervision/assistance   __x_ Walk up steps with assistance ___ Intermittent supervision/assistance  ___ Bathe/dress independently ___ Walk with walker     ___ Bathe/dress with assistance ___ Walk Independently    ___ Shower independently __x_ Walk with assistance    ___ Shower with assistance ___ No alcohol     ___ Return to work/school ________  Special Instructions:    COMMUNITY REFERRALS UPON DISCHARGE:   Outpatient: PT  Agency:[facility] Phone: [phone] Date of Last Service:01/03/2012  Appointment Date/Time:JULY 3 12:30-1:45 PM  Medical Equipment/Items Ordered:HAD FROM PREVIOUSLY   GENERAL COMMUNITY RESOURCES FOR PATIENT/FAMILY: Support Groups:[employer]      My questions have been answered and I understand these instructions. I will adhere to these goals and the provided educational materials after my discharge from the hospital.  Patient/Caregiver Signature _______________________________ Date __________  Clinician Signature _______________________________________ Date __________  Please bring this form and your medication list with you to all your follow-up doctor's appointments.

## 2012-01-03 NOTE — Progress Notes (Signed)
Occupational Therapy Session Note  Patient Details  Name: Paul Callahan MRN: [id number] Date of Birth: 08-12-1923  Today's Date: 01/03/2012 Time: 0930-1030 Time Calculation (min): 60 min  Short Term Goals: Week 2:  OT Short Term Goal 1 (Week 2): STG= LTG due to short remaining LOS.  Overall min assist with exception of mod assist LB dsg  Skilled Therapeutic Interventions/Progress Updates:    Pt completed ADL retraining at sink at sit to stand level using seat on 4 wheeled walker to assist with LB bathing.  Pt close supervision/setup with grooming and UB bathing and dressing.  Completed LB bathing and dressing from sit to stand level.  Simulated walk-in shower with 3" ledge with pt stepping over ledge backwards with Lt foot first using RW to steady self and other hand on shower chair.  Engaged in BUE strengthening in standing at sitting with 3# dowel rod and cues to weight shirt through RLE to increase heel strike in standing and ambulation.  Pt with increased anxiety regarding d/c and with questions to this therapist and PA about moving up d/c.  Plans to move up d/c in place.  Discussed with wife regarding follow up and no OT recommended as she has hired aide who will continue to assist with bathing and dressing upon d/c.  Therapy Documentation Precautions:  Precautions Precautions: Fall Precaution Comments: h/o lumbar surgery, dementia, Parkinson's dz Required Braces or Orthoses: Other Brace/Splint Other Brace/Splint: R AFO Restrictions Weight Bearing Restrictions: No Pain: Pain Assessment Pain Assessment: No/denies pain  See FIM for current functional status  Therapy/Group: Individual Therapy  Leonette Monarch 01/03/2012, 3:54 PM

## 2012-01-03 NOTE — Discharge Summary (Signed)
  Discharge summary job # [id number]

## 2012-01-03 NOTE — Progress Notes (Signed)
Occupational Therapy Discharge Summary  Patient Details  Name: Paul Callahan MRN: [id number] Date of Birth: 1924/04/27  Today's Date: 01/03/2012 Time: 0930-1030 Time Calculation (min): 60 min  Patient has met 9 of 9 long term goals due to improved activity tolerance, improved balance and ability to compensate for deficits.  Patient to discharge at [facility] Assist level.  Patient's care partner is independent to provide the necessary physical and cognitive assistance at discharge.  Paul Callahan and hired aide will resume assisting with bathing and dressing.  Paul Callahan verbalizes understanding of appropriate cues to provide to increase safety with mobility.  Reasons goals not met: N/A  Recommendation:  Patient will not benefit from additional follow up OT at this time.  Paul Callahan and Callahan have elected to return to using personal aide to assist with bathing and dressing and return to outpatient Paul Callahan as before.  Equipment: No equipment provided  Reasons for discharge: treatment goals met  Patient/family agrees with progress made and goals achieved: Yes  OT Discharge Precautions/Restrictions  Precautions Precautions: Fall Precaution Comments: h/o lumbar surgery, dementia, Parkinson's disease Required Braces or Orthoses: Other Brace/Splint Other Brace/Splint: Rt AFO Pain Pain Assessment Pain Assessment: No/denies pain ADL ADL Eating: Independent Where Assessed-Eating: Chair Grooming: Supervision/safety Where Assessed-Grooming: Standing at sink Upper Body Bathing: Supervision/safety Where Assessed-Upper Body Bathing: Sitting at sink;Shower Lower Body Bathing: Minimal assistance Where Assessed-Lower Body Bathing: Sitting at sink;Shower Upper Body Dressing: Supervision/safety;Setup Where Assessed-Upper Body Dressing: Edge of bed Lower Body Dressing: Moderate assistance Where Assessed-Lower Body Dressing: Edge of bed Toileting: Contact guard Where Assessed-Toileting: Public house manager: Curator Method: Ambulating (with RW) Acupuncturist: Engineer, manufacturing systems Transfer: Insurance underwriter Method: Stand pivot;Ambulating (with RW) Walk-In Shower Equipment: Shower seat with back ADL Comments: Paul Callahan continues to require max assist wtih LB dressing.  Paul Callahan and Callahan report that Paul Callahan and aide would assist with bathing and dress him. Vision/Perception  Vision - History Baseline Vision: Wears glasses all the time Patient Visual Report: No change from baseline Vision - Assessment Eye Alignment: Within Functional Limits  Cognition Overall Cognitive Status: Impaired at baseline Arousal/Alertness: Awake/alert Memory: Impaired Memory Impairment: Decreased recall of new information;Decreased short term memory Sensation Sensation Light Touch: Appears Intact Proprioception: Appears Intact Coordination Fine Motor Movements are Fluid and Coordinated: No Coordination and Movement Description: Paul coordination impaired by Parkinson's disease Motor  Motor Motor: Abnormal tone;Abnormal postural alignment and control Motor - Discharge Observations: Parkinson's disease; increased rigidity/tone R > L; R leg length discrepency and R foot drop from previous back surgery. Sits in R lateral flexion, forward head with R lateral flexion and L rotation Mobility  Bed Mobility Bed Mobility: Supine to Sit;Sit to Supine Supine to Sit: 5: Supervision Sit to Supine: 5: Supervision Sit to Supine - Details (indicate cue type and reason): Supine <> sit flat ADL bed supervision with verbal cues to initiate and sequence but no physical assistance needed; Callahan reports that patient has rail on bed at home with assist with sup > sit.    Trunk/Postural Assessment  Cervical Assessment Cervical Assessment:  (same as eval) Thoracic Assessment Thoracic Assessment:  (same as eval) Lumbar Assessment Lumbar Assessment:  (same as eval) Postural  Control Postural Control:  (same as eval)  Balance Dynamic Sitting Balance Dynamic Sitting - Level of Assistance: 6: Modified independent (Device/Increase time) Static Standing Balance Static Standing - Balance Support: Bilateral upper extremity supported Static Standing - Level of Assistance: 5: Stand  by assistance Dynamic Standing Balance Dynamic Standing - Balance Support: Bilateral upper extremity supported Dynamic Standing - Level of Assistance: 5: Stand by assistance Extremity/Trunk Assessment RUE Assessment RUE Assessment: Exceptions to [facility] RUE AROM (degrees) Overall AROM Right Upper Extremity: Due to premorbid status RUE Strength RUE Overall Strength: Within Functional Limits for tasks performed LUE Assessment LUE Assessment: Exceptions to [facility] LUE AROM (degrees) Overall AROM Left Upper Extremity: Due to premorbid status LUE Strength LUE Overall Strength: Within Functional Limits for tasks assessed  See FIM for current functional status  Leonette Monarch 01/03/2012, 4:19 PM

## 2012-01-04 ENCOUNTER — Ambulatory Visit: Payer: Medicare Other | Attending: Psychiatry | Admitting: Physical Therapy

## 2012-01-04 DIAGNOSIS — R269 Unspecified abnormalities of gait and mobility: Secondary | ICD-10-CM | POA: Diagnosis not present

## 2012-01-04 DIAGNOSIS — M6281 Muscle weakness (generalized): Secondary | ICD-10-CM | POA: Diagnosis not present

## 2012-01-04 DIAGNOSIS — R293 Abnormal posture: Secondary | ICD-10-CM | POA: Diagnosis not present

## 2012-01-04 DIAGNOSIS — R5381 Other malaise: Secondary | ICD-10-CM | POA: Insufficient documentation

## 2012-01-04 DIAGNOSIS — IMO0001 Reserved for inherently not codable concepts without codable children: Secondary | ICD-10-CM | POA: Insufficient documentation

## 2012-01-09 ENCOUNTER — Ambulatory Visit: Payer: Medicare Other | Admitting: Physical Therapy

## 2012-01-09 DIAGNOSIS — K5900 Constipation, unspecified: Secondary | ICD-10-CM | POA: Diagnosis not present

## 2012-01-09 DIAGNOSIS — K56609 Unspecified intestinal obstruction, unspecified as to partial versus complete obstruction: Secondary | ICD-10-CM | POA: Diagnosis not present

## 2012-01-09 DIAGNOSIS — R5381 Other malaise: Secondary | ICD-10-CM | POA: Diagnosis not present

## 2012-01-10 DIAGNOSIS — G20 Parkinson's disease: Secondary | ICD-10-CM | POA: Diagnosis not present

## 2012-01-10 DIAGNOSIS — G609 Hereditary and idiopathic neuropathy, unspecified: Secondary | ICD-10-CM | POA: Diagnosis not present

## 2012-01-10 DIAGNOSIS — M216X9 Other acquired deformities of unspecified foot: Secondary | ICD-10-CM | POA: Diagnosis not present

## 2012-01-10 DIAGNOSIS — F0280 Dementia in other diseases classified elsewhere without behavioral disturbance: Secondary | ICD-10-CM | POA: Diagnosis not present

## 2012-01-12 DIAGNOSIS — G20 Parkinson's disease: Secondary | ICD-10-CM | POA: Diagnosis not present

## 2012-01-12 DIAGNOSIS — R269 Unspecified abnormalities of gait and mobility: Secondary | ICD-10-CM | POA: Diagnosis not present

## 2012-01-12 DIAGNOSIS — IMO0001 Reserved for inherently not codable concepts without codable children: Secondary | ICD-10-CM | POA: Diagnosis not present

## 2012-01-12 DIAGNOSIS — M216X9 Other acquired deformities of unspecified foot: Secondary | ICD-10-CM | POA: Diagnosis not present

## 2012-01-12 DIAGNOSIS — F329 Major depressive disorder, single episode, unspecified: Secondary | ICD-10-CM | POA: Diagnosis not present

## 2012-01-16 DIAGNOSIS — IMO0001 Reserved for inherently not codable concepts without codable children: Secondary | ICD-10-CM | POA: Diagnosis not present

## 2012-01-16 DIAGNOSIS — F329 Major depressive disorder, single episode, unspecified: Secondary | ICD-10-CM | POA: Diagnosis not present

## 2012-01-16 DIAGNOSIS — R269 Unspecified abnormalities of gait and mobility: Secondary | ICD-10-CM | POA: Diagnosis not present

## 2012-01-16 DIAGNOSIS — G20 Parkinson's disease: Secondary | ICD-10-CM | POA: Diagnosis not present

## 2012-01-16 DIAGNOSIS — M216X9 Other acquired deformities of unspecified foot: Secondary | ICD-10-CM | POA: Diagnosis not present

## 2012-01-17 ENCOUNTER — Ambulatory Visit: Payer: Medicare Other | Admitting: Physical Therapy

## 2012-01-18 DIAGNOSIS — F329 Major depressive disorder, single episode, unspecified: Secondary | ICD-10-CM | POA: Diagnosis not present

## 2012-01-18 DIAGNOSIS — IMO0001 Reserved for inherently not codable concepts without codable children: Secondary | ICD-10-CM | POA: Diagnosis not present

## 2012-01-18 DIAGNOSIS — R269 Unspecified abnormalities of gait and mobility: Secondary | ICD-10-CM | POA: Diagnosis not present

## 2012-01-18 DIAGNOSIS — M216X9 Other acquired deformities of unspecified foot: Secondary | ICD-10-CM | POA: Diagnosis not present

## 2012-01-18 DIAGNOSIS — G20 Parkinson's disease: Secondary | ICD-10-CM | POA: Diagnosis not present

## 2012-01-19 ENCOUNTER — Ambulatory Visit: Payer: Medicare Other | Admitting: Physical Therapy

## 2012-01-20 ENCOUNTER — Ambulatory Visit: Payer: Medicare Other | Admitting: Physical Therapy

## 2012-01-20 ENCOUNTER — Encounter: Payer: Medicare Other | Admitting: Rehabilitative and Restorative Service Providers"

## 2012-01-20 DIAGNOSIS — M216X9 Other acquired deformities of unspecified foot: Secondary | ICD-10-CM | POA: Diagnosis not present

## 2012-01-20 DIAGNOSIS — F329 Major depressive disorder, single episode, unspecified: Secondary | ICD-10-CM | POA: Diagnosis not present

## 2012-01-20 DIAGNOSIS — R269 Unspecified abnormalities of gait and mobility: Secondary | ICD-10-CM | POA: Diagnosis not present

## 2012-01-20 DIAGNOSIS — IMO0001 Reserved for inherently not codable concepts without codable children: Secondary | ICD-10-CM | POA: Diagnosis not present

## 2012-01-20 DIAGNOSIS — G20 Parkinson's disease: Secondary | ICD-10-CM | POA: Diagnosis not present

## 2012-01-24 DIAGNOSIS — IMO0001 Reserved for inherently not codable concepts without codable children: Secondary | ICD-10-CM | POA: Diagnosis not present

## 2012-01-24 DIAGNOSIS — G20 Parkinson's disease: Secondary | ICD-10-CM | POA: Diagnosis not present

## 2012-01-24 DIAGNOSIS — F329 Major depressive disorder, single episode, unspecified: Secondary | ICD-10-CM | POA: Diagnosis not present

## 2012-01-24 DIAGNOSIS — R269 Unspecified abnormalities of gait and mobility: Secondary | ICD-10-CM | POA: Diagnosis not present

## 2012-01-24 DIAGNOSIS — M216X9 Other acquired deformities of unspecified foot: Secondary | ICD-10-CM | POA: Diagnosis not present

## 2012-01-25 ENCOUNTER — Ambulatory Visit: Payer: Medicare Other | Admitting: Physical Therapy

## 2012-01-26 ENCOUNTER — Ambulatory Visit: Payer: Medicare Other | Admitting: Physical Therapy

## 2012-01-26 DIAGNOSIS — F329 Major depressive disorder, single episode, unspecified: Secondary | ICD-10-CM | POA: Diagnosis not present

## 2012-01-26 DIAGNOSIS — IMO0001 Reserved for inherently not codable concepts without codable children: Secondary | ICD-10-CM | POA: Diagnosis not present

## 2012-01-26 DIAGNOSIS — G20 Parkinson's disease: Secondary | ICD-10-CM | POA: Diagnosis not present

## 2012-01-26 DIAGNOSIS — M216X9 Other acquired deformities of unspecified foot: Secondary | ICD-10-CM | POA: Diagnosis not present

## 2012-01-26 DIAGNOSIS — R269 Unspecified abnormalities of gait and mobility: Secondary | ICD-10-CM | POA: Diagnosis not present

## 2012-01-27 DIAGNOSIS — F329 Major depressive disorder, single episode, unspecified: Secondary | ICD-10-CM | POA: Diagnosis not present

## 2012-01-27 DIAGNOSIS — IMO0001 Reserved for inherently not codable concepts without codable children: Secondary | ICD-10-CM | POA: Diagnosis not present

## 2012-01-27 DIAGNOSIS — G20 Parkinson's disease: Secondary | ICD-10-CM | POA: Diagnosis not present

## 2012-01-27 DIAGNOSIS — M216X9 Other acquired deformities of unspecified foot: Secondary | ICD-10-CM | POA: Diagnosis not present

## 2012-01-27 DIAGNOSIS — R269 Unspecified abnormalities of gait and mobility: Secondary | ICD-10-CM | POA: Diagnosis not present

## 2012-01-31 DIAGNOSIS — R269 Unspecified abnormalities of gait and mobility: Secondary | ICD-10-CM | POA: Diagnosis not present

## 2012-01-31 DIAGNOSIS — G20 Parkinson's disease: Secondary | ICD-10-CM | POA: Diagnosis not present

## 2012-01-31 DIAGNOSIS — F329 Major depressive disorder, single episode, unspecified: Secondary | ICD-10-CM | POA: Diagnosis not present

## 2012-01-31 DIAGNOSIS — IMO0001 Reserved for inherently not codable concepts without codable children: Secondary | ICD-10-CM | POA: Diagnosis not present

## 2012-01-31 DIAGNOSIS — M216X9 Other acquired deformities of unspecified foot: Secondary | ICD-10-CM | POA: Diagnosis not present

## 2012-02-01 ENCOUNTER — Ambulatory Visit: Payer: Medicare Other | Admitting: Physical Therapy

## 2012-02-02 ENCOUNTER — Ambulatory Visit: Payer: Medicare Other | Admitting: Rehabilitative and Restorative Service Providers"

## 2012-02-02 DIAGNOSIS — F329 Major depressive disorder, single episode, unspecified: Secondary | ICD-10-CM | POA: Diagnosis not present

## 2012-02-02 DIAGNOSIS — M216X9 Other acquired deformities of unspecified foot: Secondary | ICD-10-CM | POA: Diagnosis not present

## 2012-02-02 DIAGNOSIS — G20 Parkinson's disease: Secondary | ICD-10-CM | POA: Diagnosis not present

## 2012-02-02 DIAGNOSIS — R269 Unspecified abnormalities of gait and mobility: Secondary | ICD-10-CM | POA: Diagnosis not present

## 2012-02-02 DIAGNOSIS — IMO0001 Reserved for inherently not codable concepts without codable children: Secondary | ICD-10-CM | POA: Diagnosis not present

## 2012-02-03 ENCOUNTER — Ambulatory Visit: Payer: Medicare Other | Admitting: Physical Therapy

## 2012-02-03 DIAGNOSIS — R269 Unspecified abnormalities of gait and mobility: Secondary | ICD-10-CM | POA: Diagnosis not present

## 2012-02-03 DIAGNOSIS — G20 Parkinson's disease: Secondary | ICD-10-CM | POA: Diagnosis not present

## 2012-02-03 DIAGNOSIS — IMO0001 Reserved for inherently not codable concepts without codable children: Secondary | ICD-10-CM | POA: Diagnosis not present

## 2012-02-03 DIAGNOSIS — F329 Major depressive disorder, single episode, unspecified: Secondary | ICD-10-CM | POA: Diagnosis not present

## 2012-02-03 DIAGNOSIS — M216X9 Other acquired deformities of unspecified foot: Secondary | ICD-10-CM | POA: Diagnosis not present

## 2012-02-06 ENCOUNTER — Ambulatory Visit: Payer: Medicare Other | Admitting: Physical Therapy

## 2012-02-08 ENCOUNTER — Ambulatory Visit: Payer: Medicare Other | Admitting: Physical Therapy

## 2012-02-09 ENCOUNTER — Ambulatory Visit: Payer: Medicare Other | Attending: Psychiatry | Admitting: Physical Therapy

## 2012-02-09 DIAGNOSIS — R5381 Other malaise: Secondary | ICD-10-CM | POA: Diagnosis not present

## 2012-02-09 DIAGNOSIS — M6281 Muscle weakness (generalized): Secondary | ICD-10-CM | POA: Insufficient documentation

## 2012-02-09 DIAGNOSIS — IMO0001 Reserved for inherently not codable concepts without codable children: Secondary | ICD-10-CM | POA: Insufficient documentation

## 2012-02-09 DIAGNOSIS — R293 Abnormal posture: Secondary | ICD-10-CM | POA: Insufficient documentation

## 2012-02-09 DIAGNOSIS — R269 Unspecified abnormalities of gait and mobility: Secondary | ICD-10-CM | POA: Diagnosis not present

## 2012-02-13 ENCOUNTER — Ambulatory Visit: Payer: Medicare Other | Admitting: Physical Therapy

## 2012-02-14 DIAGNOSIS — F332 Major depressive disorder, recurrent severe without psychotic features: Secondary | ICD-10-CM | POA: Diagnosis not present

## 2012-02-15 ENCOUNTER — Ambulatory Visit: Payer: Medicare Other | Admitting: Physical Therapy

## 2012-02-17 ENCOUNTER — Ambulatory Visit: Payer: Medicare Other | Admitting: *Deleted

## 2012-02-20 ENCOUNTER — Ambulatory Visit: Payer: Medicare Other | Admitting: Physical Therapy

## 2012-02-27 ENCOUNTER — Ambulatory Visit: Payer: Medicare Other | Admitting: Physical Therapy

## 2012-03-01 ENCOUNTER — Ambulatory Visit: Payer: Medicare Other | Admitting: Physical Therapy

## 2012-03-06 DIAGNOSIS — E785 Hyperlipidemia, unspecified: Secondary | ICD-10-CM | POA: Diagnosis not present

## 2012-03-06 DIAGNOSIS — E559 Vitamin D deficiency, unspecified: Secondary | ICD-10-CM | POA: Diagnosis not present

## 2012-03-06 DIAGNOSIS — Z0000 Encounter for general adult medical examination without abnormal findings: Secondary | ICD-10-CM | POA: Diagnosis not present

## 2012-03-06 DIAGNOSIS — Z125 Encounter for screening for malignant neoplasm of prostate: Secondary | ICD-10-CM | POA: Diagnosis not present

## 2012-03-07 ENCOUNTER — Ambulatory Visit: Payer: Medicare Other | Attending: Psychiatry | Admitting: Physical Therapy

## 2012-03-07 DIAGNOSIS — R293 Abnormal posture: Secondary | ICD-10-CM | POA: Diagnosis not present

## 2012-03-07 DIAGNOSIS — R269 Unspecified abnormalities of gait and mobility: Secondary | ICD-10-CM | POA: Insufficient documentation

## 2012-03-07 DIAGNOSIS — IMO0001 Reserved for inherently not codable concepts without codable children: Secondary | ICD-10-CM | POA: Diagnosis not present

## 2012-03-07 DIAGNOSIS — R5381 Other malaise: Secondary | ICD-10-CM | POA: Diagnosis not present

## 2012-03-07 DIAGNOSIS — M6281 Muscle weakness (generalized): Secondary | ICD-10-CM | POA: Insufficient documentation

## 2012-03-12 ENCOUNTER — Ambulatory Visit: Payer: Medicare Other | Admitting: Physical Therapy

## 2012-03-12 DIAGNOSIS — E785 Hyperlipidemia, unspecified: Secondary | ICD-10-CM | POA: Diagnosis not present

## 2012-03-12 DIAGNOSIS — N529 Male erectile dysfunction, unspecified: Secondary | ICD-10-CM | POA: Diagnosis not present

## 2012-03-12 DIAGNOSIS — E559 Vitamin D deficiency, unspecified: Secondary | ICD-10-CM | POA: Diagnosis not present

## 2012-03-12 DIAGNOSIS — M545 Low back pain: Secondary | ICD-10-CM | POA: Diagnosis not present

## 2012-03-13 ENCOUNTER — Ambulatory Visit: Payer: Medicare Other | Admitting: Physical Therapy

## 2012-03-14 ENCOUNTER — Ambulatory Visit (INDEPENDENT_AMBULATORY_CARE_PROVIDER_SITE_OTHER): Payer: Medicare Other | Admitting: General Surgery

## 2012-03-14 ENCOUNTER — Encounter (INDEPENDENT_AMBULATORY_CARE_PROVIDER_SITE_OTHER): Payer: Self-pay | Admitting: General Surgery

## 2012-03-14 VITALS — BP 138/86 | HR 84 | Temp 97.8°F | Resp 18 | Ht 66.0 in | Wt 178.4 lb

## 2012-03-14 DIAGNOSIS — K56609 Unspecified intestinal obstruction, unspecified as to partial versus complete obstruction: Secondary | ICD-10-CM | POA: Diagnosis not present

## 2012-03-14 NOTE — Progress Notes (Signed)
Subjective:     Patient ID: Paul Callahan, male   DOB: 24-Jan-1924, 78 y.o.   MRN: [id number]  HPI Patient presents status post hospitalization for small bowel obstruction. He was transferred to inpatient rehabilitation. He has been moving his bowels every day or every other day. He takes MiraLAX as needed. He takes a stool softener every day.  Review of Systems     Objective:   Physical Exam  Cardiovascular: Normal rate and normal heart sounds.   Pulmonary/Chest: Effort normal and breath sounds normal. No respiratory distress. He has no wheezes.  Neurological: He is alert.  Skin: Skin is warm.   Abdomen is soft and nontender. No distention. Old midline scar is intact. Bowel sounds are active.    Assessment:     Small bowel obstruction resolved    Plan:     Return when necessary

## 2012-04-11 DIAGNOSIS — G609 Hereditary and idiopathic neuropathy, unspecified: Secondary | ICD-10-CM | POA: Diagnosis not present

## 2012-04-11 DIAGNOSIS — G3183 Dementia with Lewy bodies: Secondary | ICD-10-CM | POA: Diagnosis not present

## 2012-04-11 DIAGNOSIS — F0280 Dementia in other diseases classified elsewhere without behavioral disturbance: Secondary | ICD-10-CM | POA: Diagnosis not present

## 2012-04-11 DIAGNOSIS — G20 Parkinson's disease: Secondary | ICD-10-CM | POA: Diagnosis not present

## 2012-04-16 DIAGNOSIS — M25569 Pain in unspecified knee: Secondary | ICD-10-CM | POA: Diagnosis not present

## 2012-04-16 DIAGNOSIS — M1710 Unilateral primary osteoarthritis, unspecified knee: Secondary | ICD-10-CM | POA: Diagnosis not present

## 2012-04-27 DIAGNOSIS — Z23 Encounter for immunization: Secondary | ICD-10-CM | POA: Diagnosis not present

## 2012-06-05 DIAGNOSIS — F332 Major depressive disorder, recurrent severe without psychotic features: Secondary | ICD-10-CM | POA: Diagnosis not present

## 2012-06-21 DIAGNOSIS — G20 Parkinson's disease: Secondary | ICD-10-CM | POA: Diagnosis not present

## 2012-09-25 DIAGNOSIS — F332 Major depressive disorder, recurrent severe without psychotic features: Secondary | ICD-10-CM | POA: Diagnosis not present

## 2012-09-27 ENCOUNTER — Emergency Department (HOSPITAL_COMMUNITY): Payer: Medicare Other

## 2012-09-27 ENCOUNTER — Inpatient Hospital Stay (HOSPITAL_COMMUNITY)
Admission: EM | Admit: 2012-09-27 | Discharge: 2012-09-28 | DRG: 312 | Disposition: A | Discharge status: Home / Self Care | Payer: Medicare Other | Attending: Family Medicine | Admitting: Family Medicine

## 2012-09-27 ENCOUNTER — Encounter (HOSPITAL_COMMUNITY): Payer: Self-pay | Admitting: Emergency Medicine

## 2012-09-27 DIAGNOSIS — Z888 Allergy status to other drugs, medicaments and biological substances status: Secondary | ICD-10-CM

## 2012-09-27 DIAGNOSIS — R55 Syncope and collapse: Principal | ICD-10-CM

## 2012-09-27 DIAGNOSIS — Z85038 Personal history of other malignant neoplasm of large intestine: Secondary | ICD-10-CM | POA: Diagnosis not present

## 2012-09-27 DIAGNOSIS — R030 Elevated blood-pressure reading, without diagnosis of hypertension: Secondary | ICD-10-CM | POA: Diagnosis present

## 2012-09-27 DIAGNOSIS — R4789 Other speech disturbances: Secondary | ICD-10-CM | POA: Diagnosis not present

## 2012-09-27 DIAGNOSIS — Z87891 Personal history of nicotine dependence: Secondary | ICD-10-CM

## 2012-09-27 DIAGNOSIS — G20C Parkinsonism, unspecified: Secondary | ICD-10-CM

## 2012-09-27 DIAGNOSIS — F3289 Other specified depressive episodes: Secondary | ICD-10-CM | POA: Diagnosis present

## 2012-09-27 DIAGNOSIS — R9439 Abnormal result of other cardiovascular function study: Secondary | ICD-10-CM

## 2012-09-27 DIAGNOSIS — R42 Dizziness and giddiness: Secondary | ICD-10-CM | POA: Diagnosis not present

## 2012-09-27 DIAGNOSIS — F0280 Dementia in other diseases classified elsewhere without behavioral disturbance: Secondary | ICD-10-CM | POA: Diagnosis present

## 2012-09-27 DIAGNOSIS — G20 Parkinson's disease: Secondary | ICD-10-CM

## 2012-09-27 DIAGNOSIS — N179 Acute kidney failure, unspecified: Secondary | ICD-10-CM

## 2012-09-27 DIAGNOSIS — R5381 Other malaise: Secondary | ICD-10-CM | POA: Diagnosis not present

## 2012-09-27 DIAGNOSIS — I639 Cerebral infarction, unspecified: Secondary | ICD-10-CM

## 2012-09-27 DIAGNOSIS — F329 Major depressive disorder, single episode, unspecified: Secondary | ICD-10-CM | POA: Diagnosis present

## 2012-09-27 DIAGNOSIS — D696 Thrombocytopenia, unspecified: Secondary | ICD-10-CM

## 2012-09-27 DIAGNOSIS — I6350 Cerebral infarction due to unspecified occlusion or stenosis of unspecified cerebral artery: Secondary | ICD-10-CM | POA: Diagnosis not present

## 2012-09-27 DIAGNOSIS — G3183 Dementia with Lewy bodies: Secondary | ICD-10-CM | POA: Diagnosis present

## 2012-09-27 DIAGNOSIS — R404 Transient alteration of awareness: Secondary | ICD-10-CM | POA: Diagnosis not present

## 2012-09-27 DIAGNOSIS — G20A1 Parkinson's disease without dyskinesia, without mention of fluctuations: Secondary | ICD-10-CM

## 2012-09-27 DIAGNOSIS — K56609 Unspecified intestinal obstruction, unspecified as to partial versus complete obstruction: Secondary | ICD-10-CM

## 2012-09-27 DIAGNOSIS — Z79899 Other long term (current) drug therapy: Secondary | ICD-10-CM

## 2012-09-27 LAB — RAPID URINE DRUG SCREEN, HOSP PERFORMED
Amphetamines: NOT DETECTED
Barbiturates: NOT DETECTED
Benzodiazepines: NOT DETECTED
Cocaine: NOT DETECTED
Tetrahydrocannabinol: NOT DETECTED

## 2012-09-27 LAB — CBC
MCH: 31.9 pg (ref 26.0–34.0)
MCV: 93.6 fL (ref 78.0–100.0)
Platelets: 130 10*3/uL — ABNORMAL LOW (ref 150–400)
RDW: 13.5 % (ref 11.5–15.5)
WBC: 6.7 10*3/uL (ref 4.0–10.5)

## 2012-09-27 LAB — POCT I-STAT, CHEM 8
Calcium, Ion: 1.26 mmol/L (ref 1.13–1.30)
Chloride: 103 mEq/L (ref 96–112)
Glucose, Bld: 113 mg/dL — ABNORMAL HIGH (ref 70–99)
HCT: 44 % (ref 39.0–52.0)
TCO2: 30 mmol/L (ref 0–100)

## 2012-09-27 LAB — COMPREHENSIVE METABOLIC PANEL
ALT: <5 U/L (ref 0–53)
AST: 23 U/L (ref 0–37)
Albumin: 3.8 g/dL (ref 3.5–5.2)
Calcium: 10.1 mg/dL (ref 8.4–10.5)
GFR calc Af Amer: 86 mL/min — ABNORMAL LOW (ref >90)
Potassium: 4.7 mEq/L (ref 3.5–5.1)
Sodium: 140 mEq/L (ref 135–145)
Total Protein: 7.1 g/dL (ref 6.0–8.3)

## 2012-09-27 LAB — URINE MICROSCOPIC-ADD ON

## 2012-09-27 LAB — URINALYSIS, ROUTINE W REFLEX MICROSCOPIC
Bilirubin Urine: NEGATIVE
Ketones, ur: NEGATIVE mg/dL
Nitrite: NEGATIVE
Protein, ur: NEGATIVE mg/dL
pH: 7 (ref 5.0–8.0)

## 2012-09-27 LAB — TROPONIN I: Troponin I: <0.3 ng/mL (ref <0.30)

## 2012-09-27 LAB — DIFFERENTIAL
Basophils Absolute: 0 10*3/uL (ref 0.0–0.1)
Basophils Relative: 0 % (ref 0–1)
Eosinophils Absolute: 0.1 10*3/uL (ref 0.0–0.7)
Eosinophils Relative: 1 % (ref 0–5)
Neutrophils Relative %: 76 % (ref 43–77)

## 2012-09-27 LAB — PROTIME-INR
INR: 0.98 (ref 0.00–1.49)
Prothrombin Time: 12.9 seconds (ref 11.6–15.2)

## 2012-09-27 LAB — ETHANOL: Alcohol, Ethyl (B): <11 mg/dL (ref 0–11)

## 2012-09-27 MED ORDER — ONDANSETRON HCL 4 MG/2ML IJ SOLN: 4.0000 mg | Freq: Three times a day (TID) | INTRAMUSCULAR | Status: AC | PRN

## 2012-09-27 MED ORDER — CARBIDOPA-LEVODOPA CR 25-100 MG PO TBCR
1.0000 | EXTENDED_RELEASE_TABLET | Freq: Every day | ORAL | Status: DC
  Administered 2012-09-27: 1 via ORAL
  Filled 2012-09-27 (×2): qty 1

## 2012-09-27 MED ORDER — ONDANSETRON HCL 4 MG PO TABS: 4.0000 mg | ORAL_TABLET | Freq: Four times a day (QID) | ORAL | Status: DC | PRN

## 2012-09-27 MED ORDER — PREGABALIN 50 MG PO CAPS
150.0000 mg | ORAL_CAPSULE | Freq: Two times a day (BID) | ORAL | Status: DC
  Administered 2012-09-27 – 2012-09-28 (×2): 150 mg via ORAL
  Filled 2012-09-27 (×2): qty 1

## 2012-09-27 MED ORDER — SODIUM CHLORIDE 0.9 % IV SOLN
INTRAVENOUS | Status: DC
  Administered 2012-09-27 – 2012-09-28 (×2): via INTRAVENOUS

## 2012-09-27 MED ORDER — ENOXAPARIN SODIUM 40 MG/0.4ML ~~LOC~~ SOLN
40.0000 mg | SUBCUTANEOUS | Status: DC
  Administered 2012-09-27 – 2012-09-28 (×2): 40 mg via SUBCUTANEOUS
  Filled 2012-09-27 (×2): qty 0.4

## 2012-09-27 MED ORDER — CARBIDOPA-LEVODOPA 25-100 MG PO TABS
3.0000 | ORAL_TABLET | Freq: Three times a day (TID) | ORAL | Status: DC
  Administered 2012-09-27 – 2012-09-28 (×4): 3 via ORAL
  Filled 2012-09-27 (×5): qty 3

## 2012-09-27 MED ORDER — DULOXETINE HCL 60 MG PO CPEP
60.0000 mg | ORAL_CAPSULE | Freq: Every day | ORAL | Status: DC
  Administered 2012-09-27 – 2012-09-28 (×2): 60 mg via ORAL
  Filled 2012-09-27 (×2): qty 1

## 2012-09-27 MED ORDER — ONDANSETRON HCL 4 MG/2ML IJ SOLN: 4.0000 mg | Freq: Four times a day (QID) | INTRAMUSCULAR | Status: DC | PRN

## 2012-09-27 MED ORDER — SODIUM CHLORIDE 0.9 % IJ SOLN
3.0000 mL | Freq: Two times a day (BID) | INTRAMUSCULAR | Status: DC
  Administered 2012-09-27: 3 mL via INTRAVENOUS

## 2012-09-27 MED ORDER — SODIUM CHLORIDE 0.9 % IV SOLN: INTRAVENOUS | Status: AC

## 2012-09-27 MED ORDER — RIVASTIGMINE 9.5 MG/24HR TD PT24
9.5000 mg | MEDICATED_PATCH | Freq: Every day | TRANSDERMAL | Status: DC
  Administered 2012-09-27 – 2012-09-28 (×2): 9.5 mg via TRANSDERMAL
  Filled 2012-09-27 (×2): qty 1

## 2012-09-27 NOTE — Progress Notes (Signed)
Utilization review completed.  P.J. Rumi Kolodziej,RN,BSN Case Manager [phone]  

## 2012-09-27 NOTE — Consult Note (Addendum)
NEURO HOSPITALIST CONSULT NOTE    Reason for Consult:dizzines, left face droopiness. Code stroke.  HPI:                                                                                                                                          Paul Callahan is an 79 y.o. male with a past medical history significant for colon cancer, arthritis, low back pain status post spinal cord stimulator, depression, atypical parkinsonism, brought to [facility] as a code stroke after developing sudden onset of dizziness. History is obtained from patient, medics, and wife. Paul Callahan exercises routinely, and today was at the gym with personal trainer, lifting some weights, when suddenly became dizzy/lightheaded, and passed out. According to his wife, his blood pressure was in the 40's and therefore ambulance was called. Of note, she tells me that this had happened before and he was advised to take medication to maintain his blood pressure up. When medics got to the scene, he was alert, awake, following commands, with " mild left face droopiness". Upon arrival to the ED he denied headache, vertigo, double vision, difficulty swallowing, slurred speech, language or vision impairment. His NIHSS was 1 and urgent CT brain revealed no acute intracranial abnormality. Feels better now, but still " little bit dizzy".   Past Medical History  Diagnosis Date  . Parkinson disease   . Dementia   . Depression   . Bowel obstruction 12/15/2011  . Neuromuscular disorder     hx of parkinsons  . Cancer     hx of colon cancer  . Arthritis     Past Surgical History  Procedure Laterality Date  . Eye surgery    . Cataracts    . Hernia repair    . Back surgery      x 5    No family history on file.    Social History:  reports that he has quit smoking. He has never used smokeless tobacco. He reports that he does not drink alcohol or use illicit drugs.  Allergies  Allergen Reactions  . Amantadines  Other (See Comments)    Weakness in legs and hallucinations  . Fentanyl Other (See Comments)    Confusion and disorientation   . Gabapentin Other (See Comments)    Weakness in legs and hallucinations  . Oxycodone     Confusion and disorientation   . Phenergan (Promethazine Hcl)     Confusion and disorientation     MEDICATIONS:  I have reviewed the patient's current medications.   ROS:                                                                                                                                       History obtained from the patient, wife, medics, and chart review.  General ROS: negative for - chills, fatigue, fever, night sweats, weight gain or weight loss Psychological ROS: negative for - behavioral disorder, hallucinations, memory difficulties, mood swings or suicidal ideation Ophthalmic ROS: negative for - blurry vision, double vision, eye pain or loss of vision ENT ROS: negative for - epistaxis, nasal discharge, oral lesions, sore throat, tinnitus or vertigo Allergy and Immunology ROS: negative for - hives or itchy/watery eyes Hematological and Lymphatic ROS: negative for - bleeding problems, bruising or swollen lymph nodes Endocrine ROS: negative for - galactorrhea, hair pattern changes, polydipsia/polyuria or temperature intolerance Respiratory ROS: negative for - cough, hemoptysis, shortness of breath or wheezing Cardiovascular ROS: negative for - chest pain, dyspnea on exertion, edema or irregular heartbeat Gastrointestinal ROS: negative for - abdominal pain, diarrhea, hematemesis, nausea/vomiting or stool incontinence Genito-Urinary ROS: negative for - dysuria, hematuria, incontinence or urinary frequency/urgency Musculoskeletal ROS: negative for - joint swelling or muscular weakness Neurological ROS: as noted in HPI Dermatological ROS: negative  for rash and skin lesion changes     Physical exam: pleasant male in no apparent distress. Blood pressure 148/68, pulse 72, temperature 97.5 F (36.4 C), temperature source Oral, resp. rate 16, SpO2 96.00%. Head: normocephalic. Neck: supple, no bruits, no JVD. Cardiac: no murmurs. Lungs: clear. Abdomen: soft, no tender, no mass. Extremities: no edema.   Neurologic Examination:                                                                                                      Mental Status: Alert, awake, oriented x 4, thought content appropriate.  Speech fluent without evidence of aphasia.  Able to follow 3 step commands without difficulty. Cranial Nerves: II: Discs flat bilaterally; Visual fields grossly normal, pupils equal, round, reactive to light and accommodation III,IV, VI: ptosis not present, extra-ocular motions intact bilaterally V,VII: smile symmetric, facial light touch sensation normal bilaterally VIII: hearing normal bilaterally IX,X: gag reflex present XI: bilateral shoulder shrug XII: midline tongue extension Motor: Right : Upper extremity   5/5    Left:     Upper extremity   5/5  Lower extremity   5/5     Lower extremity   5/5  Tone and bulk:normal tone throughout; no atrophy noted Sensory: Pinprick and light touch intact throughout, bilaterally Deep Tendon Reflexes: 2+ and symmetric throughout Plantars: Right: downgoing   Left: downgoing Cerebellar: normal finger-to-nose,  normal heel-to-shin test in the right but mildly impaired in the left. Gait: no tested. CV: pulses palpable throughout    No results found for this basename: cbc, bmp, coags, chol, tri, ldl, hga1c    Results for orders placed during the hospital encounter of 09/27/12 (from the past 48 hour(s))  POCT I-STAT, CHEM 8     Status: Abnormal   Collection Time    09/27/12 12:06 PM      Result Value Range   Sodium 142  135 - 145 mEq/L   Potassium 4.6  3.5 - 5.1 mEq/L   Chloride 103  96 -  112 mEq/L   BUN 26 (*) 6 - 23 mg/dL   Creatinine, Ser 1.61  0.50 - 1.35 mg/dL   Glucose, Bld 096 (*) 70 - 99 mg/dL   Calcium, Ion 0.45  4.09 - 1.30 mmol/L   TCO2 30  0 - 100 mmol/L   Hemoglobin 15.0  13.0 - 17.0 g/dL   HCT 81.1  91.4 - 78.2 %    No results found.   Assessment/Plan: 79 years old male with atypical parkinsonism brought to [facility] after becoming dizzy and passing out at the gym while exercising. Neuro-exam is unimpressive at this moment. Notably, he had had passed out before in the context of low blood pressure. 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 Can not have MRI due to spinal cord stimulator, and certainly I don't think further neuro-imaging is needed at this time. He is going to be admitted to the hospital for closer observation today.  Wyatt Portela, MD [facility] [phone]  09/27/2012, 12:16 PM   Addendum: Will suggest midodrine for patient's dysautonomia. Otherwise, no further neurological intervention needed at this time.' Will sign off.  Wyatt Portela, MD

## 2012-09-27 NOTE — Consult Note (Signed)
Reason for Consult: Syncope Referring Physician: [facility] Physician    HPI: [name] is an 79 y.o. Caucasian male who presented to [facility] today after sustaining a syncopal episode this morning while working out at the gym with his Systems analyst. He underwent a cardiac catheterization, by Dr. Allyson Sabal, in 2009 after a pre-operative NST suggested new ischemia in the RCA distribution. The cath revealed normal coronaries. LV function was normal with an EF of > 60%. There were no WMA. The patient and his wife, also report that he had to wear a 24 hr heart monitor in 2005 for bradycardia, but there were no significant findings.  He has not had cardiovascular evaluation since 2009. He reports being diagnosed with Parkinson Disease by one physician, but denies any other major health issues. He reports that he was in his usual state of health until his episode this morning. He was pressing weights when he suddenly loss consciousness. Luckily, his trainer was by his side and was able to catch him. It did not result in a fall and he did not sustain any injuries/trauma.  An event like this has never happened before. He reports that his trainer checked his HR, which was in the 40's. EMS was called and he was transported to [facility]. EMS had noticed slurring of his speech and left-side facial droop. Code Stroke was called. He was seen by neurology and a CT was ordered, which showed no acute abnormalities. He denies any prior symptoms leading up to the event. He did feel lightheaded after regaining consciousness. He denies chest pain, SOB, diaphoresis, palpitations, n/v, fevers, chills, hematuria, hematochezia or melana. He does note feeling fatigue over the past few days. He denies any recent medication or dietary changes. He did eat breakfast before his work-out.  He did admit to straining while lifting weights.   Past Medical History  Diagnosis Date  . Parkinson disease   . Dementia   . Depression   . Bowel obstruction  12/15/2011  . Neuromuscular disorder     hx of parkinsons  . Cancer     hx of colon cancer  . Arthritis     Past Surgical History  Procedure Laterality Date  . Eye surgery    . Cataracts    . Hernia repair    . Back surgery      x 5    History reviewed. No pertinent family history.  Social History:  reports that he has quit smoking. He has never used smokeless tobacco. He reports that he does not drink alcohol or use illicit drugs.  Allergies:  Allergies  Allergen Reactions  . Amantadines Other (See Comments)    Weakness in legs and hallucinations  . Fentanyl Other (See Comments)    Confusion and disorientation   . Gabapentin Other (See Comments)    Weakness in legs and hallucinations  . Oxycodone     Confusion and disorientation   . Phenergan (Promethazine Hcl)     Confusion and disorientation     Medications:  Prior to Admission:  Prescriptions prior to admission  Medication Sig Dispense Refill  . calcium carbonate (OS-CAL) 600 MG TABS Take 600 mg by mouth 2 (two) times daily with a meal.      . carbidopa-levodopa (SINEMET CR) 25-100 MG per tablet Take 1 tablet by mouth at bedtime.      . carbidopa-levodopa (SINEMET IR) 25-100 MG per tablet Take 3 tablets by mouth 3 (three) times daily.      Marland Kitchen  celecoxib (CELEBREX) 200 MG capsule Take 200 mg by mouth 2 (two) times daily.      . cholecalciferol (VITAMIN D) 1000 UNITS tablet Take 2,000 Units by mouth daily.      . DULoxetine (CYMBALTA) 60 MG capsule Take 60 mg by mouth daily.      . Multiple Vitamin (MULTIVITAMIN WITH MINERALS) TABS Take 1 tablet by mouth daily.      . pregabalin (LYRICA) 150 MG capsule Take 150 mg by mouth 2 (two) times daily.      . rivastigmine (EXELON) 9.5 mg/24hr Place 1 patch onto the skin daily.      Marland Kitchen zinc gluconate 50 MG tablet Take 50 mg by mouth daily.        Results for orders placed during the hospital encounter of 09/27/12 (from the past 48 hour(s))  ETHANOL     Status: None    Collection Time    09/27/12 11:49 AM      Result Value Range   Alcohol, Ethyl (B) <11  0 - 11 mg/dL   Comment:            LOWEST DETECTABLE LIMIT FOR     SERUM ALCOHOL IS 11 mg/dL     FOR MEDICAL PURPOSES ONLY  PROTIME-INR     Status: None   Collection Time    09/27/12 11:49 AM      Result Value Range   Prothrombin Time 12.9  11.6 - 15.2 seconds   INR 0.98  0.00 - 1.49  APTT     Status: None   Collection Time    09/27/12 11:49 AM      Result Value Range   aPTT 37  24 - 37 seconds   Comment:            IF BASELINE aPTT IS ELEVATED,     SUGGEST PATIENT RISK ASSESSMENT     BE USED TO DETERMINE APPROPRIATE     ANTICOAGULANT THERAPY.  CBC     Status: Abnormal   Collection Time    09/27/12 11:49 AM      Result Value Range   WBC 6.7  4.0 - 10.5 K/uL   RBC 4.54  4.22 - 5.81 MIL/uL   Hemoglobin 14.5  13.0 - 17.0 g/dL   HCT 19.1  47.8 - 29.5 %   MCV 93.6  78.0 - 100.0 fL   MCH 31.9  26.0 - 34.0 pg   MCHC 34.1  30.0 - 36.0 g/dL   RDW 62.1  30.8 - 65.7 %   Platelets 130 (*) 150 - 400 K/uL  DIFFERENTIAL     Status: None   Collection Time    09/27/12 11:49 AM      Result Value Range   Neutrophils Relative 76  43 - 77 %   Neutro Abs 5.1  1.7 - 7.7 K/uL   Lymphocytes Relative 14  12 - 46 %   Lymphs Abs 1.0  0.7 - 4.0 K/uL   Monocytes Relative 8  3 - 12 %   Monocytes Absolute 0.6  0.1 - 1.0 K/uL   Eosinophils Relative 1  0 - 5 %   Eosinophils Absolute 0.1  0.0 - 0.7 K/uL   Basophils Relative 0  0 - 1 %   Basophils Absolute 0.0  0.0 - 0.1 K/uL  COMPREHENSIVE METABOLIC PANEL     Status: Abnormal   Collection Time    09/27/12 11:49 AM      Result Value Range  Sodium 140  135 - 145 mEq/L   Potassium 4.7  3.5 - 5.1 mEq/L   Chloride 102  96 - 112 mEq/L   CO2 30  19 - 32 mEq/L   Glucose, Bld 113 (*) 70 - 99 mg/dL   BUN 26 (*) 6 - 23 mg/dL   Creatinine, Ser 4.09  0.50 - 1.35 mg/dL   Calcium 81.1  8.4 - 91.4 mg/dL   Total Protein 7.1  6.0 - 8.3 g/dL   Albumin 3.8  3.5 - 5.2  g/dL   AST 23  0 - 37 U/L   ALT <5  0 - 53 U/L   Alkaline Phosphatase 81  39 - 117 U/L   Total Bilirubin 0.7  0.3 - 1.2 mg/dL   GFR calc non Af Amer 74 (*) >90 mL/min   GFR calc Af Amer 86 (*) >90 mL/min   Comment:            The eGFR has been calculated     using the CKD EPI equation.     This calculation has not been     validated in all clinical     situations.     eGFR's persistently     <90 mL/min signify     possible Chronic Kidney Disease.  TROPONIN I     Status: None   Collection Time    09/27/12 11:49 AM      Result Value Range   Troponin I <0.30  <0.30 ng/mL   Comment:            Due to the release kinetics of cTnI,     a negative result within the first hours     of the onset of symptoms does not rule out     myocardial infarction with certainty.     If myocardial infarction is still suspected,     repeat the test at appropriate intervals.  POCT I-STAT, CHEM 8     Status: Abnormal   Collection Time    09/27/12 12:06 PM      Result Value Range   Sodium 142  135 - 145 mEq/L   Potassium 4.6  3.5 - 5.1 mEq/L   Chloride 103  96 - 112 mEq/L   BUN 26 (*) 6 - 23 mg/dL   Creatinine, Ser 7.82  0.50 - 1.35 mg/dL   Glucose, Bld 956 (*) 70 - 99 mg/dL   Calcium, Ion 2.13  0.86 - 1.30 mmol/L   TCO2 30  0 - 100 mmol/L   Hemoglobin 15.0  13.0 - 17.0 g/dL   HCT 57.8  46.9 - 62.9 %  POCT I-STAT TROPONIN I     Status: None   Collection Time    09/27/12 12:06 PM      Result Value Range   Troponin i, poc 0.00  0.00 - 0.08 ng/mL   Comment 3            Comment: Due to the release kinetics of cTnI,     a negative result within the first hours     of the onset of symptoms does not rule out     myocardial infarction with certainty.     If myocardial infarction is still suspected,     repeat the test at appropriate intervals.  GLUCOSE, CAPILLARY     Status: Abnormal   Collection Time    09/27/12 12:16 PM      Result Value Range   Glucose-Capillary 107 (*) 70 -  99 mg/dL   URINALYSIS, ROUTINE W REFLEX MICROSCOPIC     Status: Abnormal   Collection Time    09/27/12  2:19 PM      Result Value Range   Color, Urine YELLOW  YELLOW   APPearance CLOUDY (*) CLEAR   Specific Gravity, Urine 1.021  1.005 - 1.030   pH 7.0  5.0 - 8.0   Glucose, UA NEGATIVE  NEGATIVE mg/dL   Hgb urine dipstick NEGATIVE  NEGATIVE   Bilirubin Urine NEGATIVE  NEGATIVE   Ketones, ur NEGATIVE  NEGATIVE mg/dL   Protein, ur NEGATIVE  NEGATIVE mg/dL   Urobilinogen, UA 0.2  0.0 - 1.0 mg/dL   Nitrite NEGATIVE  NEGATIVE   Leukocytes, UA SMALL (*) NEGATIVE  URINE MICROSCOPIC-ADD ON     Status: None   Collection Time    09/27/12  2:19 PM      Result Value Range   Squamous Epithelial / LPF RARE  RARE   WBC, UA 3-6  <3 WBC/hpf   RBC / HPF 0-2  <3 RBC/hpf   Bacteria, UA RARE  RARE   Urine-Other MUCOUS PRESENT     Comment: AMORPHOUS URATES/PHOSPHATES    Ct Head Wo Contrast  09/27/2012  *RADIOLOGY REPORT*  Clinical Data: 79 year old male Code stroke.  Lasting normal at 11:00 a.m.  Left side weakness facial droop and slurred speech.  CT HEAD WITHOUT CONTRAST  Technique:  Contiguous axial images were obtained from the base of the skull through the vertex without contrast.  Comparison: Brain MRI without contrast 01/01/2008.  Findings: Small maxillary mucous retention cyst are stable. Increased mucosal thickening right sphenoid sinus.  Other Visualized paranasal sinuses and mastoids are clear.  No acute osseous abnormality identified.  Visualized orbits and scalp soft tissues are within normal limits.  No ventriculomegaly. No midline shift, mass effect, or evidence of mass lesion.  No suspicious intracranial vascular hyperdensity. No evidence of cortically based acute infarction identified.  No acute intracranial hemorrhage identified.  IMPRESSION: Normal noncontrast CT appearance of the brain.  Critical Value/emergent results were called by telephone at the time of interpretation on 09/27/2012 at 1235  hours to Dr. Leroy Kennedy, who verbally acknowledged these results.   Original Report Authenticated By: Erskine Speed, M.D.     Review of Systems  Constitutional: Positive for malaise/fatigue. Negative for fever, chills and diaphoresis.  Respiratory: Negative for shortness of breath.   Cardiovascular: Negative for chest pain, palpitations, orthopnea, claudication, leg swelling and PND.  Gastrointestinal: Positive for constipation. Negative for nausea, vomiting, blood in stool and melena.  Genitourinary: Negative for hematuria.  Musculoskeletal: Negative for falls.  Neurological: Positive for dizziness and loss of consciousness. Negative for weakness.   Blood pressure 167/82, pulse 69, temperature 97.4 F (36.3 C), temperature source Oral, resp. rate 16, SpO2 96.00%. Physical Exam  Constitutional: He is oriented to person, place, and time. He appears well-developed and well-nourished. No distress.  HENT:  Head: Normocephalic and atraumatic.  Eyes: Conjunctivae and EOM are normal. Pupils are equal, round, and reactive to light.  Neck: Normal range of motion. Neck supple. No JVD present. Carotid bruit is not present. No thyromegaly present.  Cardiovascular: Normal rate, regular rhythm, normal heart sounds and intact distal pulses.  Exam reveals no gallop and no friction rub.   No murmur heard. Pulses:      Radial pulses are 2+ on the right side, and 2+ on the left side.       Dorsalis pedis pulses are 2+ on  the right side, and 2+ on the left side.  Respiratory: Effort normal and breath sounds normal. No respiratory distress. He has no wheezes. He has no rales.  GI: Soft. Bowel sounds are normal. He exhibits no distension and no mass. There is no tenderness.  Musculoskeletal: He exhibits no edema.  Lymphadenopathy:    He has no cervical adenopathy.  Neurological: He is alert and oriented to person, place, and time.  Skin: Skin is warm and dry. He is not diaphoretic.  Psychiatric: He has a normal  mood and affect. His behavior is normal.    Assessment/Plan: Active Problems:   Syncope   Parkinsonism   Plan: Admitted for syncopal episode x 1. Stroke ruled out with CT. EKGs have shown NSR with no acute changes. No heart blocks noted. No arrhthymias noted on telemetry. HR is stable in the mid-upper 60's. He is on no AV blocking meds. No hypotension. No carotid bruits on exam. No hypoglycemia. H/H is WNL. Troponin negative x 1. Pt has a past history of bradycardia. ? If the patient vasovagaled while lifting weights. ? If syncope could also be a result of Parkinsonism. Plan to keep on telemetry to assess for bradycardia/ arrhythmias. May consider holter monitor as a OP.     Allayne Butcher, PA-C  09/27/2012, 3:01 PM   Patient seen and examined. Agree with assessment and plan. 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 Pt admits to straining hard during exercise to lift leg weights. Question if valsalva or vagal mediated bradyarryhmia contributing to event.  No known history of CAD, apparently had a normal cath 4 years ago. He does have a h/o atypical Parkinson's disease on Sinemet. Minimal BUN increase. No evidence for acute stroke. No definitive carotid bruits noted. 1-2/sem in aortic area suggestive of probable aortic sclerosis. Labs reviewed. ECG without acute changes or heart block. Recommend 2d echo, orthostatic BP assessment, continued telemetry. Cycle enzymes.Consider carotid duplex   Lennette Bihari, MD, [facility] 09/27/2012 4:19 PM

## 2012-09-27 NOTE — ED Provider Notes (Signed)
History     CSN: [id number]  Arrival date & time 09/27/12  1132   First MD Initiated Contact with Patient 09/27/12 1134      Chief Complaint  Patient presents with  . Code Stroke    (Consider location/radiation/quality/duration/timing/severity/associated sxs/prior treatment) HPI Comments: Pt comes in with cc of stroke, syncope. Pt states that he was at a gym, and per EMS reports, he passed out. Pt had no prodrome prior to his episode. No seizures. Pt regained consciousness spontaneously. Per EMS, patient's BP was in the 40 SBP according to the gym personnel. Pt had slurred speech, right sided droop and weakness. At ED arrival, pt is noted to have a droop, and subjective right sided upper and lower extremity weakness. Stroke team at bedside. No hx of strokes, diabetes, anticoagulation use.  The history is provided by the patient, the EMS personnel and a relative.    Past Medical History  Diagnosis Date  . Parkinson disease   . Dementia   . Depression   . Bowel obstruction 12/15/2011  . Neuromuscular disorder     hx of parkinsons  . Cancer     hx of colon cancer  . Arthritis     Past Surgical History  Procedure Laterality Date  . Eye surgery    . Cataracts    . Hernia repair    . Back surgery      x 5    History reviewed. No pertinent family history.  History  Substance Use Topics  . Smoking status: Former Games developer  . Smokeless tobacco: Never Used  . Alcohol Use: No      Review of Systems  Constitutional: Negative for fever, chills and activity change.  HENT: Negative for neck pain.   Eyes: Negative for visual disturbance.  Respiratory: Negative for cough, chest tightness and shortness of breath.   Cardiovascular: Negative for chest pain.  Gastrointestinal: Negative for abdominal distention.  Genitourinary: Negative for dysuria, enuresis and difficulty urinating.  Musculoskeletal: Negative for arthralgias.  Neurological: Positive for syncope, facial  asymmetry and weakness. Negative for dizziness, light-headedness and headaches.  Psychiatric/Behavioral: Negative for confusion.    Allergies  Amantadines; Fentanyl; Gabapentin; Oxycodone; and Phenergan  Home Medications   Current Outpatient Rx  Name  Route  Sig  Dispense  Refill  . calcium carbonate (OS-CAL) 600 MG TABS   Oral   Take 600 mg by mouth 2 (two) times daily with a meal.         . carbidopa-levodopa (SINEMET CR) 25-100 MG per tablet   Oral   Take 1 tablet by mouth at bedtime.         . carbidopa-levodopa (SINEMET IR) 25-100 MG per tablet   Oral   Take 3 tablets by mouth 3 (three) times daily.         . celecoxib (CELEBREX) 200 MG capsule   Oral   Take 200 mg by mouth 2 (two) times daily.         . cholecalciferol (VITAMIN D) 1000 UNITS tablet   Oral   Take 2,000 Units by mouth daily.         . DULoxetine (CYMBALTA) 60 MG capsule   Oral   Take 60 mg by mouth daily.         . Multiple Vitamin (MULTIVITAMIN WITH MINERALS) TABS   Oral   Take 1 tablet by mouth daily.         . pregabalin (LYRICA) 150 MG capsule   Oral  Take 150 mg by mouth 2 (two) times daily.         . rivastigmine (EXELON) 9.5 mg/24hr   Transdermal   Place 1 patch onto the skin daily.         Marland Kitchen zinc gluconate 50 MG tablet   Oral   Take 50 mg by mouth daily.           BP 128/72  Pulse 66  Temp(Src) 97.5 F (36.4 C) (Oral)  Resp 17  SpO2 93%  Physical Exam  Nursing note and vitals reviewed. Constitutional: He is oriented to person, place, and time. He appears well-developed and well-nourished.  HENT:  Head: Normocephalic and atraumatic.  Eyes: EOM are normal. Pupils are equal, round, and reactive to light.  Neck: Normal range of motion. Neck supple. No JVD present.  Cardiovascular: Normal rate and regular rhythm.   Pulmonary/Chest: Effort normal and breath sounds normal. No respiratory distress. He has no wheezes.  Abdominal: Soft. Bowel sounds are  normal. He exhibits no distension. There is no tenderness. There is no rebound and no guarding.  Neurological: He is alert and oriented to person, place, and time. No cranial nerve deficit. Coordination normal.  No objective sensory deficits, Motor strength upper and lower extremity 4+ and equal - but patient has a subjective weakness Normal cerebellar exam  Skin: Skin is warm and dry.    ED Course  Procedures (including critical care time)  Labs Reviewed  CBC - Abnormal; Notable for the following:    Platelets 130 (*)    All other components within normal limits  COMPREHENSIVE METABOLIC PANEL - Abnormal; Notable for the following:    Glucose, Bld 113 (*)    BUN 26 (*)    GFR calc non Af Amer 74 (*)    GFR calc Af Amer 86 (*)    All other components within normal limits  GLUCOSE, CAPILLARY - Abnormal; Notable for the following:    Glucose-Capillary 107 (*)    All other components within normal limits  POCT I-STAT, CHEM 8 - Abnormal; Notable for the following:    BUN 26 (*)    Glucose, Bld 113 (*)    All other components within normal limits  ETHANOL  PROTIME-INR  APTT  DIFFERENTIAL  TROPONIN I  URINE RAPID DRUG SCREEN (HOSP PERFORMED)  URINALYSIS, ROUTINE W REFLEX MICROSCOPIC  POCT I-STAT TROPONIN I   Ct Head Wo Contrast  09/27/2012  *RADIOLOGY REPORT*  Clinical Data: 79 year old male Code stroke.  Lasting normal at 11:00 a.m.  Left side weakness facial droop and slurred speech.  CT HEAD WITHOUT CONTRAST  Technique:  Contiguous axial images were obtained from the base of the skull through the vertex without contrast.  Comparison: Brain MRI without contrast 01/01/2008.  Findings: Small maxillary mucous retention cyst are stable. Increased mucosal thickening right sphenoid sinus.  Other Visualized paranasal sinuses and mastoids are clear.  No acute osseous abnormality identified.  Visualized orbits and scalp soft tissues are within normal limits.  No ventriculomegaly. No midline  shift, mass effect, or evidence of mass lesion.  No suspicious intracranial vascular hyperdensity. No evidence of cortically based acute infarction identified.  No acute intracranial hemorrhage identified.  IMPRESSION: Normal noncontrast CT appearance of the brain.  Critical Value/emergent results were called by telephone at the time of interpretation on 09/27/2012 at 1235 hours to Dr. Leroy Kennedy, who verbally acknowledged these results.   Original Report Authenticated By: Erskine Speed, M.D.      1.  Stroke   2. Syncope       MDM   Date: 09/27/2012  Rate: 73  Rhythm: normal sinus rhythm  QRS Axis: normal  Intervals: normal  ST/T Wave abnormalities: non specific changes  Conduction Disutrbances: none  Narrative Interpretation: unremarkable  DDx includes:  Stroke - ischemic vs. hemorrhagic TIA Neuropathy Myelitis Electrolyte abnormality Orthostatic hypotension Vertebral artery dissection/stenosis Dysrhythmia PE Vasovagal/neurocardiogenic syncope Aortic stenosis Valvular disorder/Cardiomyopathy Anemia  Pt comes in with cc of syncope and right sided weakness. Within TPA window, however, giventhe age, improved slurred speech, and NIHSS of 1 per Neurology assessment - no TPA given.  Pt appears to have had a stroke. Neurology to comment on stroke evaluation.  Unsure if the hypotension and syncope are related. Initial ekg is fine, and patient is normotensive in the ED. Will ask the admitting team to look into syncope as well.       Derwood Kaplan, MD 09/27/12 1336

## 2012-09-27 NOTE — H&P (Signed)
PCP:   [name], MD   Chief Complaint:  Passed out  HPI:79 y/o male who was brought to the hospital after he passed out at the gym. No h/o seizures, he regained the consciousness spontaneously, no slurred speech. Patient's Blood pressure was in 40's at the gym as per EMS. There was question of right sided droop and weakness , and questionable slurred speech. Code stroke was called and he was seen by the Neurology. Neurological exam was unimpressive as per neurology, he could not get MRI as he has spinal cord nerve stimulator.patient has h/o bradycardia and was seen by Dr Allyson Sabal in the past, he also underwent cardiac cath per wife, which was normal. Today he denies chest pain, no shortness of breath, no nausea, vomiting or diarrhea.   Allergies:   Allergies  Allergen Reactions  . Amantadines Other (See Comments)    Weakness in legs and hallucinations  . Fentanyl Other (See Comments)    Confusion and disorientation   . Gabapentin Other (See Comments)    Weakness in legs and hallucinations  . Oxycodone     Confusion and disorientation   . Phenergan (Promethazine Hcl)     Confusion and disorientation       Past Medical History  Diagnosis Date  . Parkinson disease   . Dementia   . Depression   . Bowel obstruction 12/15/2011  . Neuromuscular disorder     hx of parkinsons  . Cancer     hx of colon cancer  . Arthritis     Past Surgical History  Procedure Laterality Date  . Eye surgery    . Cataracts    . Hernia repair    . Back surgery      x 5    Prior to Admission medications   Medication Sig Start Date End Date Taking? Authorizing Provider  calcium carbonate (OS-CAL) 600 MG TABS Take 600 mg by mouth 2 (two) times daily with a meal.    Historical Provider, MD  carbidopa-levodopa (SINEMET CR) 25-100 MG per tablet Take 1 tablet by mouth at bedtime.    Historical Provider, MD  carbidopa-levodopa (SINEMET IR) 25-100 MG per tablet Take 3 tablets by mouth 3 (three) times  daily.    Historical Provider, MD  celecoxib (CELEBREX) 200 MG capsule Take 200 mg by mouth 2 (two) times daily.    Historical Provider, MD  cholecalciferol (VITAMIN D) 1000 UNITS tablet Take 2,000 Units by mouth daily.    Historical Provider, MD  DULoxetine (CYMBALTA) 60 MG capsule Take 60 mg by mouth daily.    Historical Provider, MD  Multiple Vitamin (MULTIVITAMIN WITH MINERALS) TABS Take 1 tablet by mouth daily.    Historical Provider, MD  pregabalin (LYRICA) 150 MG capsule Take 150 mg by mouth 2 (two) times daily.    Historical Provider, MD  rivastigmine (EXELON) 9.5 mg/24hr Place 1 patch onto the skin daily.    Historical Provider, MD  zinc gluconate 50 MG tablet Take 50 mg by mouth daily.    Historical Provider, MD    Social History:  reports that he has quit smoking. He has never used smokeless tobacco. He reports that he does not drink alcohol or use illicit drugs.  History reviewed. No pertinent family history.  Review of Systems:  HEENT: Denies headache, blurred vision, runny nose, sore throat,  Neck: Denies thyroid problems,lymphadenopathy Chest : Denies shortness of breath, no history of COPD Heart : Denies Chest pain,  coronary arterey disease GI: Denies  nausea,  vomiting, diarrhea, constipation GU: Denies dysuria, urgency, frequency of urination, hematuria Neuro: Denies stroke, seizures, syncope Psych: Positive history of dementia   Physical Exam: Blood pressure 167/82, pulse 69, temperature 98 F (36.7 C), temperature source Oral, resp. rate 16, SpO2 96.00%. Constitutional:   Patient is a well-developed and well-nourished male  in no acute distress and cooperative with exam. Head: Normocephalic and atraumatic Mouth: Mucus membranes moist Eyes: PERRL, EOMI, conjunctivae normal Neck: Supple, No Thyromegaly Cardiovascular: RRR, S1 normal, S2 normal Pulmonary/Chest: CTAB, no wheezes, rales, or rhonchi Abdominal: Soft. Non-tender, non-distended, bowel sounds are normal,  no masses, organomegaly, or guarding present.  Neurological: A&O x3, Strenght is normal and symmetric bilaterally in upper extremities, 4/5 in RLL,cranial nerve II-XII are grossly intact, no focal motor deficit, sensory intact to light touch bilaterally.  Extremities : No Cyanosis, Clubbing or Edema   Labs on Admission:  Results for orders placed during the hospital encounter of 09/27/12 (from the past 48 hour(s))  ETHANOL     Status: None   Collection Time    09/27/12 11:49 AM      Result Value Range   Alcohol, Ethyl (B) <11  0 - 11 mg/dL   Comment:            LOWEST DETECTABLE LIMIT FOR     SERUM ALCOHOL IS 11 mg/dL     FOR MEDICAL PURPOSES ONLY  PROTIME-INR     Status: None   Collection Time    09/27/12 11:49 AM      Result Value Range   Prothrombin Time 12.9  11.6 - 15.2 seconds   INR 0.98  0.00 - 1.49  APTT     Status: None   Collection Time    09/27/12 11:49 AM      Result Value Range   aPTT 37  24 - 37 seconds   Comment:            IF BASELINE aPTT IS ELEVATED,     SUGGEST PATIENT RISK ASSESSMENT     BE USED TO DETERMINE APPROPRIATE     ANTICOAGULANT THERAPY.  CBC     Status: Abnormal   Collection Time    09/27/12 11:49 AM      Result Value Range   WBC 6.7  4.0 - 10.5 K/uL   RBC 4.54  4.22 - 5.81 MIL/uL   Hemoglobin 14.5  13.0 - 17.0 g/dL   HCT 16.1  09.6 - 04.5 %   MCV 93.6  78.0 - 100.0 fL   MCH 31.9  26.0 - 34.0 pg   MCHC 34.1  30.0 - 36.0 g/dL   RDW 40.9  81.1 - 91.4 %   Platelets 130 (*) 150 - 400 K/uL  DIFFERENTIAL     Status: None   Collection Time    09/27/12 11:49 AM      Result Value Range   Neutrophils Relative 76  43 - 77 %   Neutro Abs 5.1  1.7 - 7.7 K/uL   Lymphocytes Relative 14  12 - 46 %   Lymphs Abs 1.0  0.7 - 4.0 K/uL   Monocytes Relative 8  3 - 12 %   Monocytes Absolute 0.6  0.1 - 1.0 K/uL   Eosinophils Relative 1  0 - 5 %   Eosinophils Absolute 0.1  0.0 - 0.7 K/uL   Basophils Relative 0  0 - 1 %   Basophils Absolute 0.0  0.0 - 0.1  K/uL  COMPREHENSIVE METABOLIC PANEL  Status: Abnormal   Collection Time    09/27/12 11:49 AM      Result Value Range   Sodium 140  135 - 145 mEq/L   Potassium 4.7  3.5 - 5.1 mEq/L   Chloride 102  96 - 112 mEq/L   CO2 30  19 - 32 mEq/L   Glucose, Bld 113 (*) 70 - 99 mg/dL   BUN 26 (*) 6 - 23 mg/dL   Creatinine, Ser 1.61  0.50 - 1.35 mg/dL   Calcium 09.6  8.4 - 04.5 mg/dL   Total Protein 7.1  6.0 - 8.3 g/dL   Albumin 3.8  3.5 - 5.2 g/dL   AST 23  0 - 37 U/L   ALT <5  0 - 53 U/L   Alkaline Phosphatase 81  39 - 117 U/L   Total Bilirubin 0.7  0.3 - 1.2 mg/dL   GFR calc non Af Amer 74 (*) >90 mL/min   GFR calc Af Amer 86 (*) >90 mL/min   Comment:            The eGFR has been calculated     using the CKD EPI equation.     This calculation has not been     validated in all clinical     situations.     eGFR's persistently     <90 mL/min signify     possible Chronic Kidney Disease.  TROPONIN I     Status: None   Collection Time    09/27/12 11:49 AM      Result Value Range   Troponin I <0.30  <0.30 ng/mL   Comment:            Due to the release kinetics of cTnI,     a negative result within the first hours     of the onset of symptoms does not rule out     myocardial infarction with certainty.     If myocardial infarction is still suspected,     repeat the test at appropriate intervals.  POCT I-STAT, CHEM 8     Status: Abnormal   Collection Time    09/27/12 12:06 PM      Result Value Range   Sodium 142  135 - 145 mEq/L   Potassium 4.6  3.5 - 5.1 mEq/L   Chloride 103  96 - 112 mEq/L   BUN 26 (*) 6 - 23 mg/dL   Creatinine, Ser 4.09  0.50 - 1.35 mg/dL   Glucose, Bld 811 (*) 70 - 99 mg/dL   Calcium, Ion 9.14  7.82 - 1.30 mmol/L   TCO2 30  0 - 100 mmol/L   Hemoglobin 15.0  13.0 - 17.0 g/dL   HCT 95.6  21.3 - 08.6 %  POCT I-STAT TROPONIN I     Status: None   Collection Time    09/27/12 12:06 PM      Result Value Range   Troponin i, poc 0.00  0.00 - 0.08 ng/mL   Comment  3            Comment: Due to the release kinetics of cTnI,     a negative result within the first hours     of the onset of symptoms does not rule out     myocardial infarction with certainty.     If myocardial infarction is still suspected,     repeat the test at appropriate intervals.  GLUCOSE, CAPILLARY     Status: Abnormal  Collection Time    09/27/12 12:16 PM      Result Value Range   Glucose-Capillary 107 (*) 70 - 99 mg/dL    Radiological Exams on Admission: Ct Head Wo Contrast  09/27/2012  *RADIOLOGY REPORT*  Clinical Data: 79 year old male Code stroke.  Lasting normal at 11:00 a.m.  Left side weakness facial droop and slurred speech.  CT HEAD WITHOUT CONTRAST  Technique:  Contiguous axial images were obtained from the base of the skull through the vertex without contrast.  Comparison: Brain MRI without contrast 01/01/2008.  Findings: Small maxillary mucous retention cyst are stable. Increased mucosal thickening right sphenoid sinus.  Other Visualized paranasal sinuses and mastoids are clear.  No acute osseous abnormality identified.  Visualized orbits and scalp soft tissues are within normal limits.  No ventriculomegaly. No midline shift, mass effect, or evidence of mass lesion.  No suspicious intracranial vascular hyperdensity. No evidence of cortically based acute infarction identified.  No acute intracranial hemorrhage identified.  IMPRESSION: Normal noncontrast CT appearance of the brain.  Critical Value/emergent results were called by telephone at the time of interpretation on 09/27/2012 at 1235 hours to Dr. Leroy Kennedy, who verbally acknowledged these results.   Original Report Authenticated By: Erskine Speed, M.D.     Assessment/Plan Syncope Parkinson disease Dementia  Syncope Will order 2d echo, cardiac monitoring on tele orthostatics Cardiology consult Will also obtain three sets of cardiac enzymes  Parkinson disease Continue with Sinemet  Dementia Continue exelon  patch  DVT prophylaxis lovenox  Time Spent on Admission: 75 min  Kennth Vanbenschoten S [facility] Pager: [phone] 09/27/2012, 2:04 PM

## 2012-09-27 NOTE — ED Notes (Signed)
RN oc CBG 107

## 2012-09-27 NOTE — ED Notes (Signed)
Pt c/o syncopal episode while working w/ Systems analyst. Per EMS pt exhibited slurred speech and left sided facial droop.

## 2012-09-27 NOTE — Code Documentation (Signed)
79 y o male who was at the gym today working with a Systems analyst when he had sudden loss of consciousness while lifting weights with his feet at 1100.  They laid him flat and palpated a blood pressure of 40.  EMS was called and they activated code stroke at 1122. They noted sl sp on arousing, and L facial droop.  Pt arrived here at 1132 and was met at the bridge by the EDP and stroke team at that time. (stroke team arrived at 1124). He was cleared at the bridge and taken to CT which showed no acute abnormality.  NIHSS is 1, for LLE ataxia. Pt did c/o light headedness at the gym, but no visual changes. Dr. Leroy Kennedy deemed him ineligible for actue stroke intervention, since focal deificits are almost cleared. Wife and pt informed of need to be admitted for stroke w/u and are agreeable. Hand-off re: above  done with ED RN.

## 2012-09-28 DIAGNOSIS — R9439 Abnormal result of other cardiovascular function study: Secondary | ICD-10-CM

## 2012-09-28 DIAGNOSIS — G20 Parkinson's disease: Secondary | ICD-10-CM | POA: Diagnosis not present

## 2012-09-28 DIAGNOSIS — R55 Syncope and collapse: Secondary | ICD-10-CM

## 2012-09-28 LAB — COMPREHENSIVE METABOLIC PANEL
AST: 32 U/L (ref 0–37)
Albumin: 3.4 g/dL — ABNORMAL LOW (ref 3.5–5.2)
BUN: 22 mg/dL (ref 6–23)
Calcium: 9.5 mg/dL (ref 8.4–10.5)
Chloride: 104 mEq/L (ref 96–112)
Creatinine, Ser: 0.99 mg/dL (ref 0.50–1.35)
GFR calc non Af Amer: 72 mL/min — ABNORMAL LOW (ref >90)
Total Bilirubin: 0.4 mg/dL (ref 0.3–1.2)

## 2012-09-28 LAB — CBC
HCT: 41.2 % (ref 39.0–52.0)
MCH: 31.4 pg (ref 26.0–34.0)
MCV: 94.3 fL (ref 78.0–100.0)
Platelets: 139 10*3/uL — ABNORMAL LOW (ref 150–400)
RDW: 13.6 % (ref 11.5–15.5)

## 2012-09-28 NOTE — Progress Notes (Signed)
[facility]  Subjective: Feels like his usual self. He denies feeling lightheaded/dizzy. No syncope/presyncope, cp or SOB.  Objective: Vital signs in last 24 hours: Temp:  [97.4 F (36.3 C)-98.9 F (37.2 C)] 98.9 F (37.2 C) (03/28 0759) Pulse Rate:  [64-94] 72 (03/28 0759) Resp:  [14-20] 14 (03/28 0759) BP: (119-184)/(59-82) 119/59 mmHg (03/28 0759) SpO2:  [92 %-100 %] 94 % (03/28 0759) Weight:  [180 lb 14.4 oz (82.056 kg)] 180 lb 14.4 oz (82.056 kg) Oct 21, 2022 1515) Last BM Date: 09/26/12  Intake/Output from previous day: 10/21/2022 0701 - 03/28 0700 In: 1138.8 [I.V.:1138.8] Out: 900 [Urine:900] Intake/Output this shift:    Medications Current Facility-Administered Medications  Medication Dose Route Frequency Provider Last Rate Last Dose  . 0.9 %  sodium chloride infusion   Intravenous STAT Ankit Nanavati, MD      . 0.9 %  sodium chloride infusion   Intravenous Continuous Meredeth Ide, MD 75 mL/hr at 09/28/12 0540    . carbidopa-levodopa (SINEMET CR) 25-100 MG per tablet controlled release 1 tablet  1 tablet Oral QHS Meredeth Ide, MD   1 tablet at 10-20-2012 2210  . carbidopa-levodopa (SINEMET IR) 25-100 MG per tablet immediate release 3 tablet  3 tablet Oral TID PC Meredeth Ide, MD   3 tablet at 09/28/12 0834  . DULoxetine (CYMBALTA) DR capsule 60 mg  60 mg Oral Daily Meredeth Ide, MD   60 mg at 20-Oct-2012 1548  . enoxaparin (LOVENOX) injection 40 mg  40 mg Subcutaneous Q24H Meredeth Ide, MD   40 mg at 2012/10/20 1548  . ondansetron (ZOFRAN) tablet 4 mg  4 mg Oral Q6H PRN Meredeth Ide, MD       Or  . ondansetron (ZOFRAN) injection 4 mg  4 mg Intravenous Q6H PRN Meredeth Ide, MD      . pregabalin (LYRICA) capsule 150 mg  150 mg Oral BID Meredeth Ide, MD   150 mg at Oct 20, 2012 2210  . rivastigmine (EXELON) 9.5 mg/24hr 9.5 mg  9.5 mg Transdermal Daily Meredeth Ide, MD   9.5 mg at 10-20-12 1710  . sodium chloride 0.9 % injection 3 mL  3 mL Intravenous Q12H Meredeth Ide, MD   3 mL at 10-20-12 1548    PE: General appearance: alert, cooperative and no distress Lungs: clear to auscultation bilaterally Heart: regular rate and rhythm and 1/6 SEM Extremities: no LEE Pulses: 2+ and symmetric Skin: warm and dry Neurologic: Grossly normal  Lab Results:   Recent Labs  10-20-12 1149 October 20, 2012 1206 09/28/12 0248  WBC 6.7  --  5.9  HGB 14.5 15.0 13.7  HCT 42.5 44.0 41.2  PLT 130*  --  139*   BMET  Recent Labs  2012-10-20 1149 10-20-12 1206 09/28/12 0248  NA 140 142 141  K 4.7 4.6 4.7  CL 102 103 104  CO2 30  --  31  GLUCOSE 113* 113* 101*  BUN 26* 26* 22  CREATININE 0.93 1.10 0.99  CALCIUM 10.1  --  9.5   PT/INR  Recent Labs  10-20-2012 1149  LABPROT 12.9  INR 0.98   Cholesterol No results found for this basename: CHOL,  in the last 72 hours Cardiac Enzymes No components found with this basename: TROPONIN,  [facility],   Studies/Results:  Carotid Duplex 10/20/2017 Preliminary Result:  There is no obvious evidence of hemodynamically significant carotid artery stenosis >40%. The left vertebral artery is patent with antegrade  flow. Unable to visualize the right vertebral artery due to patient position.   2D Echo - pending   Assessment/Plan  Active Problems:   Syncope   Parkinsonism   Abnormal nuclear stress test in 2009 - subsequent cath revealed normal coronaries w/ EF >60%, no WMA  Plan: No further syncope/presyncope. He has maintained NSR since admission. No bradycardia or tachycardia. No arrhythmias. HR in the upper 60s. No heart blocks noted. He has been normotensive. Most recent BP is 119/59. Will check orthostatics. Preliminary report on carotid dopplers suggest no evidence of hemodynamically significant carotid artery stenosis >40%.  The left vertebral artery is patent with antegrade flow. The sonographer was unable to visualize the right vertebral artery due to patient position. 2D echo is pending. Based on history patient  provided yesterday, the syncopal episode is likely a result of vasovagal. Will check echo. Can also consider holter monitor as OP.     LOS: 1 day    Brittainy M. Delmer Islam 09/28/2012 10:58 AM  I have seen and examined the patient along with Brittainy M. Sharol Harness, PA-C.  I have reviewed the chart, notes and new data.  I agree with PA's note.  Key new complaints: no new syncope/presyncope. Asymptomatic Key examination changes: no arrhythmia on monitor Key new findings / data: normal echo and carotid duplex  PLAN: No evidence of structural heart disease - low risk of malignant arrhythmia.  Differential diagnosis includes neurally mediated syncope versus carotid sinus hypersensitivity syndrome.  Recommend 30 day event monitor - we will arrange this as well a outpatient follow up.  Liberalize salt intake and stay very well hydrated.  Thurmon Fair, MD, [facility] [phone] 09/28/2012, 5:12 PM

## 2012-09-28 NOTE — Progress Notes (Signed)
*  PRELIMINARY RESULTS* Vascular Ultrasound Carotid Duplex (Doppler) has been completed.  There is no obvious evidence of hemodynamically significant carotid artery stenosis >40%. The left vertebral artery is patent with antegrade flow. Unable to visualize the right vertebral artery due to patient position.  09/28/2012 10:15 AM Gertie Fey, RDMS, RDCS

## 2012-09-28 NOTE — Progress Notes (Signed)
  Echocardiogram 2D Echocardiogram has been performed.  Paul Callahan 09/28/2012, 10:00 AM

## 2012-09-28 NOTE — Discharge Summary (Signed)
Physician Discharge Summary  Paul Callahan ZOX:[id number] DOB: 1924-06-25 DOA: 09/27/2012  PCP: [name], MD  Admit date: 09/27/2012 Discharge date: 09/28/2012  Time spent: 50* minutes  Recommendations for Outpatient Follow-up:   Follow up cardiology as outpatient   Discharge Diagnoses:  Active Problems:   Syncope   Parkinsonism   Abnormal nuclear stress test in 2009 - subsequent cath revealed normal coronaries w/ EF >60%, no WMA   Discharge Condition: Stable  Diet recommendation: regualr  Filed Weights   09/27/12 1515  Weight: 82.056 kg (180 lb 14.4 oz)    History of present illness:  79 y/o male who was brought to the hospital after he passed out at the gym. No h/o seizures, he regained the consciousness spontaneously, no slurred speech. Patient's Blood pressure was in 40's at the gym as per EMS. There was question of right sided droop and weakness , and questionable slurred speech. Code stroke was called and he was seen by the Neurology. Neurological exam was unimpressive as per neurology, he could not get MRI as he has spinal cord nerve stimulator.patient has h/o bradycardia and was seen by Dr Allyson Sabal in the past, he also underwent cardiac cath per wife, which was normal. Today he denies chest pain, no shortness of breath, no nausea, vomiting or diarrhea.    Hospital Course:   Syncope Patient was admitted for the work up of syncope, cardiology was consulted.All the work up including tele monitoring, echo , carotid duplex have been negative. Cardiology will arrange for outpatient event monitor.  High Blood pressure ?stress related Isolated reading, discussed with cardiology. Will follow as outpatient, no medication necessary at this time  Parkinson disease  Continue with Sinemet   Dementia  Continue exelon patch   Procedures:  2d echo  Consultations:  cardiology  Discharge Exam: Filed Vitals:   09/28/12 0200 09/28/12 0400 09/28/12 0759 09/28/12 1400  BP:  122/70 140/65 119/59 169/83  Pulse: 64 64 72 74  Temp:  97.6 F (36.4 C) 98.9 F (37.2 C) 98.4 F (36.9 C)  TempSrc:   Oral Oral  Resp: 16 16 14 18   Height:      Weight:      SpO2: 92% 93% 94% 94%    General: Appear in no acute distress Cardiovascular: s1s2 RRR Respiratory:  Clear bilaterally  Discharge Instructions  Discharge Orders   Future Orders Complete By Expires     Diet - low sodium heart healthy  As directed     Increase activity slowly  As directed         Medication List    TAKE these medications       BIOTIN PO  Take 1 tablet by mouth every evening.     calcium carbonate 600 MG Tabs  Commonly known as:  OS-CAL  Take 600 mg by mouth 2 (two) times daily with a meal.     carbidopa-levodopa 25-100 MG per tablet  Commonly known as:  SINEMET IR  Take 3 tablets by mouth 3 (three) times daily.     carbidopa-levodopa 25-100 MG per tablet  Commonly known as:  SINEMET CR  Take 1 tablet by mouth at bedtime.     celecoxib 200 MG capsule  Commonly known as:  CELEBREX  Take 200 mg by mouth daily with supper.     cholecalciferol 1000 UNITS tablet  Commonly known as:  VITAMIN D  Take 2,000 Units by mouth daily.     DULoxetine 60 MG capsule  Commonly known as:  CYMBALTA  Take 60 mg by mouth 2 (two) times daily.     multivitamin with minerals Tabs  Take 1 tablet by mouth daily.     pregabalin 150 MG capsule  Commonly known as:  LYRICA  Take 150 mg by mouth 2 (two) times daily.     rivastigmine 9.5 mg/24hr  Commonly known as:  EXELON  Place 1 patch onto the skin daily.     zinc gluconate 50 MG tablet  Take 50 mg by mouth daily.           Follow-up Information   Follow up with Runell Gess, MD. (our office will call with date and time of visit and date and time for heart monitor)    Contact information:   [address] [place] [address] [phone]       Follow up with [name], MD In 1 week.   Contact  information:   [address] [place] [address] [phone]        The results of significant diagnostics from this hospitalization (including imaging, microbiology, ancillary and laboratory) are listed below for reference.    Significant Diagnostic Studies: Ct Head Wo Contrast  09/27/2012  *RADIOLOGY REPORT*  Clinical Data: 79 year old male Code stroke.  Lasting normal at 11:00 a.m.  Left side weakness facial droop and slurred speech.  CT HEAD WITHOUT CONTRAST  Technique:  Contiguous axial images were obtained from the base of the skull through the vertex without contrast.  Comparison: Brain MRI without contrast 01/01/2008.  Findings: Small maxillary mucous retention cyst are stable. Increased mucosal thickening right sphenoid sinus.  Other Visualized paranasal sinuses and mastoids are clear.  No acute osseous abnormality identified.  Visualized orbits and scalp soft tissues are within normal limits.  No ventriculomegaly. No midline shift, mass effect, or evidence of mass lesion.  No suspicious intracranial vascular hyperdensity. No evidence of cortically based acute infarction identified.  No acute intracranial hemorrhage identified.  IMPRESSION: Normal noncontrast CT appearance of the brain.  Critical Value/emergent results were called by telephone at the time of interpretation on 09/27/2012 at 1235 hours to Dr. Leroy Kennedy, who verbally acknowledged these results.   Original Report Authenticated By: Erskine Speed, M.D.     Microbiology: No results found for this or any previous visit (from the past 240 hour(s)).   Labs: Basic Metabolic Panel:  Recent Labs Lab 09/27/12 1149 09/27/12 1206 09/28/12 0248  NA 140 142 141  K 4.7 4.6 4.7  CL 102 103 104  CO2 30  --  31  GLUCOSE 113* 113* 101*  BUN 26* 26* 22  CREATININE 0.93 1.10 0.99  CALCIUM 10.1  --  9.5   Liver Function Tests:  Recent Labs Lab 09/27/12 1149 09/28/12 0248  AST 23 32  ALT <5 <5  ALKPHOS 81 72   BILITOT 0.7 0.4  PROT 7.1 6.5  ALBUMIN 3.8 3.4*   No results found for this basename: LIPASE, AMYLASE,  in the last 168 hours No results found for this basename: AMMONIA,  in the last 168 hours CBC:  Recent Labs Lab 09/27/12 1149 09/27/12 1206 09/28/12 0248  WBC 6.7  --  5.9  NEUTROABS 5.1  --   --   HGB 14.5 15.0 13.7  HCT 42.5 44.0 41.2  MCV 93.6  --  94.3  PLT 130*  --  139*   Cardiac Enzymes:  Recent Labs Lab 09/27/12 1149 09/27/12 1535 09/27/12 2043 09/28/12 0248  TROPONINI <0.30 <0.30 <  0.30 <0.30   BNP: BNP (last 3 results) No results found for this basename: PROBNP,  in the last 8760 hours CBG:  Recent Labs Lab 09/27/12 1216  GLUCAP 107*       Signed:  LAMA,GAGAN S  [facility] 09/28/2012, 5:21 PM

## 2012-10-03 DIAGNOSIS — R55 Syncope and collapse: Secondary | ICD-10-CM | POA: Diagnosis not present

## 2012-10-16 DIAGNOSIS — F0280 Dementia in other diseases classified elsewhere without behavioral disturbance: Secondary | ICD-10-CM | POA: Diagnosis not present

## 2012-10-16 DIAGNOSIS — G609 Hereditary and idiopathic neuropathy, unspecified: Secondary | ICD-10-CM | POA: Diagnosis not present

## 2012-10-16 DIAGNOSIS — G3183 Dementia with Lewy bodies: Secondary | ICD-10-CM | POA: Diagnosis not present

## 2012-10-16 DIAGNOSIS — M216X9 Other acquired deformities of unspecified foot: Secondary | ICD-10-CM | POA: Diagnosis not present

## 2012-10-16 DIAGNOSIS — G20 Parkinson's disease: Secondary | ICD-10-CM | POA: Diagnosis not present

## 2012-10-31 DIAGNOSIS — R42 Dizziness and giddiness: Secondary | ICD-10-CM | POA: Diagnosis not present

## 2012-10-31 DIAGNOSIS — R404 Transient alteration of awareness: Secondary | ICD-10-CM | POA: Diagnosis not present

## 2012-11-07 DIAGNOSIS — E782 Mixed hyperlipidemia: Secondary | ICD-10-CM | POA: Diagnosis not present

## 2012-11-07 DIAGNOSIS — R55 Syncope and collapse: Secondary | ICD-10-CM | POA: Diagnosis not present

## 2012-11-08 ENCOUNTER — Other Ambulatory Visit (HOSPITAL_COMMUNITY): Payer: Self-pay | Admitting: Cardiovascular Disease

## 2012-11-08 DIAGNOSIS — G450 Vertebro-basilar artery syndrome: Secondary | ICD-10-CM

## 2012-11-12 ENCOUNTER — Ambulatory Visit (HOSPITAL_COMMUNITY)
Admission: RE | Admit: 2012-11-12 | Discharge: 2012-11-12 | Disposition: A | Discharge status: Home / Self Care | Payer: Medicare Other | Source: Ambulatory Visit | Attending: Cardiovascular Disease | Admitting: Cardiovascular Disease

## 2012-11-12 DIAGNOSIS — I6789 Other cerebrovascular disease: Secondary | ICD-10-CM | POA: Diagnosis not present

## 2012-11-12 DIAGNOSIS — G450 Vertebro-basilar artery syndrome: Secondary | ICD-10-CM

## 2012-11-12 DIAGNOSIS — R030 Elevated blood-pressure reading, without diagnosis of hypertension: Secondary | ICD-10-CM | POA: Diagnosis not present

## 2012-11-12 NOTE — Progress Notes (Signed)
Arterial Upper Ext Duplex Completed. Rosaly Labarbera D  

## 2012-12-07 ENCOUNTER — Telehealth: Payer: Self-pay | Admitting: Pharmacist Clinician (PhC)/ Clinical Pharmacy Specialist

## 2012-12-07 NOTE — Telephone Encounter (Signed)
Message copied by Rosalee Kaufman on Fri Dec 07, 2012  4:56 PM ------      Message from: Nicki Guadalajara A      Created: Thu Dec 06, 2012  5:42 PM       within normal limits       ------

## 2013-01-07 DIAGNOSIS — D485 Neoplasm of uncertain behavior of skin: Secondary | ICD-10-CM | POA: Diagnosis not present

## 2013-01-07 DIAGNOSIS — L570 Actinic keratosis: Secondary | ICD-10-CM | POA: Diagnosis not present

## 2013-01-07 DIAGNOSIS — Z85828 Personal history of other malignant neoplasm of skin: Secondary | ICD-10-CM | POA: Diagnosis not present

## 2013-01-07 DIAGNOSIS — D239 Other benign neoplasm of skin, unspecified: Secondary | ICD-10-CM | POA: Diagnosis not present

## 2013-01-07 DIAGNOSIS — L821 Other seborrheic keratosis: Secondary | ICD-10-CM | POA: Diagnosis not present

## 2013-01-15 DIAGNOSIS — F332 Major depressive disorder, recurrent severe without psychotic features: Secondary | ICD-10-CM | POA: Diagnosis not present

## 2013-02-18 DIAGNOSIS — H5010 Unspecified exotropia: Secondary | ICD-10-CM | POA: Diagnosis not present

## 2013-02-18 DIAGNOSIS — Z961 Presence of intraocular lens: Secondary | ICD-10-CM | POA: Diagnosis not present

## 2013-02-18 DIAGNOSIS — H43819 Vitreous degeneration, unspecified eye: Secondary | ICD-10-CM | POA: Diagnosis not present

## 2013-02-18 DIAGNOSIS — H3530 Unspecified macular degeneration: Secondary | ICD-10-CM | POA: Diagnosis not present

## 2013-03-12 DIAGNOSIS — E785 Hyperlipidemia, unspecified: Secondary | ICD-10-CM | POA: Diagnosis not present

## 2013-03-12 DIAGNOSIS — N390 Urinary tract infection, site not specified: Secondary | ICD-10-CM | POA: Diagnosis not present

## 2013-03-12 DIAGNOSIS — Z0000 Encounter for general adult medical examination without abnormal findings: Secondary | ICD-10-CM | POA: Diagnosis not present

## 2013-03-12 DIAGNOSIS — Z23 Encounter for immunization: Secondary | ICD-10-CM | POA: Diagnosis not present

## 2013-03-12 DIAGNOSIS — Z125 Encounter for screening for malignant neoplasm of prostate: Secondary | ICD-10-CM | POA: Diagnosis not present

## 2013-03-12 DIAGNOSIS — Z1331 Encounter for screening for depression: Secondary | ICD-10-CM | POA: Diagnosis not present

## 2013-03-12 DIAGNOSIS — E559 Vitamin D deficiency, unspecified: Secondary | ICD-10-CM | POA: Diagnosis not present

## 2013-03-19 DIAGNOSIS — N529 Male erectile dysfunction, unspecified: Secondary | ICD-10-CM | POA: Diagnosis not present

## 2013-03-19 DIAGNOSIS — E559 Vitamin D deficiency, unspecified: Secondary | ICD-10-CM | POA: Diagnosis not present

## 2013-03-19 DIAGNOSIS — E785 Hyperlipidemia, unspecified: Secondary | ICD-10-CM | POA: Diagnosis not present

## 2013-03-19 DIAGNOSIS — M545 Low back pain: Secondary | ICD-10-CM | POA: Diagnosis not present

## 2013-03-28 DIAGNOSIS — G20 Parkinson's disease: Secondary | ICD-10-CM | POA: Diagnosis not present

## 2013-03-28 DIAGNOSIS — F0390 Unspecified dementia without behavioral disturbance: Secondary | ICD-10-CM | POA: Diagnosis not present

## 2013-04-17 DIAGNOSIS — G20 Parkinson's disease: Secondary | ICD-10-CM | POA: Diagnosis not present

## 2013-04-29 DIAGNOSIS — F0280 Dementia in other diseases classified elsewhere without behavioral disturbance: Secondary | ICD-10-CM | POA: Diagnosis not present

## 2013-04-29 DIAGNOSIS — G609 Hereditary and idiopathic neuropathy, unspecified: Secondary | ICD-10-CM | POA: Diagnosis not present

## 2013-04-29 DIAGNOSIS — G20 Parkinson's disease: Secondary | ICD-10-CM | POA: Diagnosis not present

## 2013-05-07 DIAGNOSIS — F332 Major depressive disorder, recurrent severe without psychotic features: Secondary | ICD-10-CM | POA: Diagnosis not present

## 2013-06-24 DIAGNOSIS — F0280 Dementia in other diseases classified elsewhere without behavioral disturbance: Secondary | ICD-10-CM | POA: Diagnosis not present

## 2013-06-24 DIAGNOSIS — G609 Hereditary and idiopathic neuropathy, unspecified: Secondary | ICD-10-CM | POA: Diagnosis not present

## 2013-06-24 DIAGNOSIS — G250 Essential tremor: Secondary | ICD-10-CM | POA: Diagnosis not present

## 2013-06-24 DIAGNOSIS — G20 Parkinson's disease: Secondary | ICD-10-CM | POA: Diagnosis not present

## 2013-07-15 DIAGNOSIS — L905 Scar conditions and fibrosis of skin: Secondary | ICD-10-CM | POA: Diagnosis not present

## 2013-07-15 DIAGNOSIS — L570 Actinic keratosis: Secondary | ICD-10-CM | POA: Diagnosis not present

## 2013-07-15 DIAGNOSIS — Z85828 Personal history of other malignant neoplasm of skin: Secondary | ICD-10-CM | POA: Diagnosis not present

## 2013-10-10 DIAGNOSIS — G3185 Corticobasal degeneration: Secondary | ICD-10-CM | POA: Insufficient documentation

## 2013-10-10 DIAGNOSIS — G20 Parkinson's disease: Secondary | ICD-10-CM | POA: Diagnosis not present

## 2013-10-22 DIAGNOSIS — F332 Major depressive disorder, recurrent severe without psychotic features: Secondary | ICD-10-CM | POA: Diagnosis not present

## 2013-10-28 DIAGNOSIS — M1710 Unilateral primary osteoarthritis, unspecified knee: Secondary | ICD-10-CM | POA: Diagnosis not present

## 2013-11-04 ENCOUNTER — Ambulatory Visit: Payer: Medicare Other | Attending: Psychiatry | Admitting: Physical Therapy

## 2013-11-04 DIAGNOSIS — G20A1 Parkinson's disease without dyskinesia, without mention of fluctuations: Secondary | ICD-10-CM | POA: Insufficient documentation

## 2013-11-04 DIAGNOSIS — R269 Unspecified abnormalities of gait and mobility: Secondary | ICD-10-CM | POA: Diagnosis not present

## 2013-11-04 DIAGNOSIS — G20 Parkinson's disease: Secondary | ICD-10-CM | POA: Diagnosis not present

## 2013-11-04 DIAGNOSIS — IMO0001 Reserved for inherently not codable concepts without codable children: Secondary | ICD-10-CM | POA: Diagnosis not present

## 2013-11-07 ENCOUNTER — Ambulatory Visit: Payer: Medicare Other | Admitting: Physical Therapy

## 2013-11-07 DIAGNOSIS — R269 Unspecified abnormalities of gait and mobility: Secondary | ICD-10-CM | POA: Diagnosis not present

## 2013-11-07 DIAGNOSIS — G20 Parkinson's disease: Secondary | ICD-10-CM | POA: Diagnosis not present

## 2013-11-07 DIAGNOSIS — IMO0001 Reserved for inherently not codable concepts without codable children: Secondary | ICD-10-CM | POA: Diagnosis not present

## 2013-11-13 ENCOUNTER — Ambulatory Visit: Payer: Medicare Other | Admitting: Physical Therapy

## 2013-11-13 DIAGNOSIS — R269 Unspecified abnormalities of gait and mobility: Secondary | ICD-10-CM | POA: Diagnosis not present

## 2013-11-13 DIAGNOSIS — IMO0001 Reserved for inherently not codable concepts without codable children: Secondary | ICD-10-CM | POA: Diagnosis not present

## 2013-11-13 DIAGNOSIS — G20 Parkinson's disease: Secondary | ICD-10-CM | POA: Diagnosis not present

## 2013-11-14 ENCOUNTER — Ambulatory Visit: Payer: Medicare Other | Admitting: Physical Therapy

## 2013-11-19 ENCOUNTER — Ambulatory Visit: Payer: Medicare Other | Admitting: Physical Therapy

## 2013-11-19 DIAGNOSIS — R269 Unspecified abnormalities of gait and mobility: Secondary | ICD-10-CM | POA: Diagnosis not present

## 2013-11-19 DIAGNOSIS — IMO0001 Reserved for inherently not codable concepts without codable children: Secondary | ICD-10-CM | POA: Diagnosis not present

## 2013-11-19 DIAGNOSIS — G20 Parkinson's disease: Secondary | ICD-10-CM | POA: Diagnosis not present

## 2013-11-21 ENCOUNTER — Ambulatory Visit: Payer: Medicare Other | Admitting: Physical Therapy

## 2013-11-21 DIAGNOSIS — G20 Parkinson's disease: Secondary | ICD-10-CM | POA: Diagnosis not present

## 2013-11-21 DIAGNOSIS — R269 Unspecified abnormalities of gait and mobility: Secondary | ICD-10-CM | POA: Diagnosis not present

## 2013-11-21 DIAGNOSIS — IMO0001 Reserved for inherently not codable concepts without codable children: Secondary | ICD-10-CM | POA: Diagnosis not present

## 2013-11-26 ENCOUNTER — Ambulatory Visit: Payer: Medicare Other | Admitting: Physical Therapy

## 2013-11-26 DIAGNOSIS — G20 Parkinson's disease: Secondary | ICD-10-CM | POA: Diagnosis not present

## 2013-11-26 DIAGNOSIS — IMO0001 Reserved for inherently not codable concepts without codable children: Secondary | ICD-10-CM | POA: Diagnosis not present

## 2013-11-26 DIAGNOSIS — R269 Unspecified abnormalities of gait and mobility: Secondary | ICD-10-CM | POA: Diagnosis not present

## 2013-11-27 ENCOUNTER — Ambulatory Visit: Payer: Medicare Other | Admitting: Physical Therapy

## 2013-12-02 ENCOUNTER — Ambulatory Visit: Payer: Medicare Other | Admitting: Physical Therapy

## 2013-12-05 ENCOUNTER — Ambulatory Visit: Payer: Medicare Other | Attending: Psychiatry | Admitting: Physical Therapy

## 2013-12-05 DIAGNOSIS — IMO0001 Reserved for inherently not codable concepts without codable children: Secondary | ICD-10-CM | POA: Insufficient documentation

## 2013-12-05 DIAGNOSIS — G20 Parkinson's disease: Secondary | ICD-10-CM | POA: Diagnosis not present

## 2013-12-05 DIAGNOSIS — G20A1 Parkinson's disease without dyskinesia, without mention of fluctuations: Secondary | ICD-10-CM | POA: Diagnosis not present

## 2013-12-05 DIAGNOSIS — R269 Unspecified abnormalities of gait and mobility: Secondary | ICD-10-CM | POA: Insufficient documentation

## 2013-12-09 DIAGNOSIS — F332 Major depressive disorder, recurrent severe without psychotic features: Secondary | ICD-10-CM | POA: Diagnosis not present

## 2013-12-11 ENCOUNTER — Ambulatory Visit: Payer: Medicare Other | Admitting: Physical Therapy

## 2013-12-11 DIAGNOSIS — G20 Parkinson's disease: Secondary | ICD-10-CM | POA: Diagnosis not present

## 2013-12-11 DIAGNOSIS — R269 Unspecified abnormalities of gait and mobility: Secondary | ICD-10-CM | POA: Diagnosis not present

## 2013-12-11 DIAGNOSIS — IMO0001 Reserved for inherently not codable concepts without codable children: Secondary | ICD-10-CM | POA: Diagnosis not present

## 2013-12-17 ENCOUNTER — Ambulatory Visit: Payer: Medicare Other | Admitting: Physical Therapy

## 2013-12-17 DIAGNOSIS — IMO0001 Reserved for inherently not codable concepts without codable children: Secondary | ICD-10-CM | POA: Diagnosis not present

## 2013-12-17 DIAGNOSIS — G20 Parkinson's disease: Secondary | ICD-10-CM | POA: Diagnosis not present

## 2013-12-17 DIAGNOSIS — R269 Unspecified abnormalities of gait and mobility: Secondary | ICD-10-CM | POA: Diagnosis not present

## 2013-12-19 ENCOUNTER — Ambulatory Visit: Payer: Medicare Other | Admitting: Physical Therapy

## 2013-12-19 DIAGNOSIS — R269 Unspecified abnormalities of gait and mobility: Secondary | ICD-10-CM | POA: Diagnosis not present

## 2013-12-19 DIAGNOSIS — G20 Parkinson's disease: Secondary | ICD-10-CM | POA: Diagnosis not present

## 2013-12-19 DIAGNOSIS — IMO0001 Reserved for inherently not codable concepts without codable children: Secondary | ICD-10-CM | POA: Diagnosis not present

## 2013-12-24 ENCOUNTER — Ambulatory Visit: Payer: Medicare Other | Admitting: Physical Therapy

## 2013-12-24 DIAGNOSIS — R269 Unspecified abnormalities of gait and mobility: Secondary | ICD-10-CM | POA: Diagnosis not present

## 2013-12-24 DIAGNOSIS — IMO0001 Reserved for inherently not codable concepts without codable children: Secondary | ICD-10-CM | POA: Diagnosis not present

## 2013-12-24 DIAGNOSIS — G20 Parkinson's disease: Secondary | ICD-10-CM | POA: Diagnosis not present

## 2013-12-26 ENCOUNTER — Ambulatory Visit: Payer: Medicare Other | Admitting: Physical Therapy

## 2013-12-26 DIAGNOSIS — IMO0001 Reserved for inherently not codable concepts without codable children: Secondary | ICD-10-CM | POA: Diagnosis not present

## 2013-12-26 DIAGNOSIS — R269 Unspecified abnormalities of gait and mobility: Secondary | ICD-10-CM | POA: Diagnosis not present

## 2013-12-26 DIAGNOSIS — G20 Parkinson's disease: Secondary | ICD-10-CM | POA: Diagnosis not present

## 2014-01-06 ENCOUNTER — Ambulatory Visit: Payer: Medicare Other | Attending: Psychiatry | Admitting: Physical Therapy

## 2014-01-06 DIAGNOSIS — IMO0001 Reserved for inherently not codable concepts without codable children: Secondary | ICD-10-CM | POA: Insufficient documentation

## 2014-01-06 DIAGNOSIS — R269 Unspecified abnormalities of gait and mobility: Secondary | ICD-10-CM | POA: Insufficient documentation

## 2014-01-06 DIAGNOSIS — G20 Parkinson's disease: Secondary | ICD-10-CM | POA: Insufficient documentation

## 2014-01-06 DIAGNOSIS — G20A1 Parkinson's disease without dyskinesia, without mention of fluctuations: Secondary | ICD-10-CM | POA: Insufficient documentation

## 2014-01-08 ENCOUNTER — Ambulatory Visit: Payer: Medicare Other | Admitting: Physical Therapy

## 2014-01-08 DIAGNOSIS — IMO0001 Reserved for inherently not codable concepts without codable children: Secondary | ICD-10-CM | POA: Diagnosis not present

## 2014-01-13 ENCOUNTER — Ambulatory Visit: Payer: Medicare Other | Admitting: Physical Therapy

## 2014-01-13 DIAGNOSIS — D239 Other benign neoplasm of skin, unspecified: Secondary | ICD-10-CM | POA: Diagnosis not present

## 2014-01-13 DIAGNOSIS — L820 Inflamed seborrheic keratosis: Secondary | ICD-10-CM | POA: Diagnosis not present

## 2014-01-13 DIAGNOSIS — L821 Other seborrheic keratosis: Secondary | ICD-10-CM | POA: Diagnosis not present

## 2014-01-13 DIAGNOSIS — IMO0001 Reserved for inherently not codable concepts without codable children: Secondary | ICD-10-CM | POA: Diagnosis not present

## 2014-01-13 DIAGNOSIS — L570 Actinic keratosis: Secondary | ICD-10-CM | POA: Diagnosis not present

## 2014-01-13 DIAGNOSIS — D485 Neoplasm of uncertain behavior of skin: Secondary | ICD-10-CM | POA: Diagnosis not present

## 2014-01-13 DIAGNOSIS — C44319 Basal cell carcinoma of skin of other parts of face: Secondary | ICD-10-CM | POA: Diagnosis not present

## 2014-01-13 DIAGNOSIS — Z85828 Personal history of other malignant neoplasm of skin: Secondary | ICD-10-CM | POA: Diagnosis not present

## 2014-02-24 DIAGNOSIS — G250 Essential tremor: Secondary | ICD-10-CM | POA: Diagnosis not present

## 2014-02-24 DIAGNOSIS — G4752 REM sleep behavior disorder: Secondary | ICD-10-CM | POA: Diagnosis not present

## 2014-02-24 DIAGNOSIS — M216X9 Other acquired deformities of unspecified foot: Secondary | ICD-10-CM | POA: Diagnosis not present

## 2014-02-24 DIAGNOSIS — G20 Parkinson's disease: Secondary | ICD-10-CM | POA: Diagnosis not present

## 2014-02-25 DIAGNOSIS — Z85828 Personal history of other malignant neoplasm of skin: Secondary | ICD-10-CM | POA: Diagnosis not present

## 2014-02-25 DIAGNOSIS — C44319 Basal cell carcinoma of skin of other parts of face: Secondary | ICD-10-CM | POA: Diagnosis not present

## 2014-02-27 DIAGNOSIS — H52209 Unspecified astigmatism, unspecified eye: Secondary | ICD-10-CM | POA: Diagnosis not present

## 2014-02-27 DIAGNOSIS — H3530 Unspecified macular degeneration: Secondary | ICD-10-CM | POA: Diagnosis not present

## 2014-02-27 DIAGNOSIS — Z961 Presence of intraocular lens: Secondary | ICD-10-CM | POA: Diagnosis not present

## 2014-03-04 DIAGNOSIS — F332 Major depressive disorder, recurrent severe without psychotic features: Secondary | ICD-10-CM | POA: Diagnosis not present

## 2014-03-04 DIAGNOSIS — D485 Neoplasm of uncertain behavior of skin: Secondary | ICD-10-CM | POA: Diagnosis not present

## 2014-03-04 DIAGNOSIS — C44319 Basal cell carcinoma of skin of other parts of face: Secondary | ICD-10-CM | POA: Diagnosis not present

## 2014-03-11 DIAGNOSIS — D485 Neoplasm of uncertain behavior of skin: Secondary | ICD-10-CM | POA: Diagnosis not present

## 2014-03-21 DIAGNOSIS — Z23 Encounter for immunization: Secondary | ICD-10-CM | POA: Diagnosis not present

## 2014-04-08 DIAGNOSIS — Z0000 Encounter for general adult medical examination without abnormal findings: Secondary | ICD-10-CM | POA: Diagnosis not present

## 2014-04-08 DIAGNOSIS — E785 Hyperlipidemia, unspecified: Secondary | ICD-10-CM | POA: Diagnosis not present

## 2014-04-08 DIAGNOSIS — Z125 Encounter for screening for malignant neoplasm of prostate: Secondary | ICD-10-CM | POA: Diagnosis not present

## 2014-04-08 DIAGNOSIS — Z1389 Encounter for screening for other disorder: Secondary | ICD-10-CM | POA: Diagnosis not present

## 2014-04-08 DIAGNOSIS — E559 Vitamin D deficiency, unspecified: Secondary | ICD-10-CM | POA: Diagnosis not present

## 2014-04-09 DIAGNOSIS — C44319 Basal cell carcinoma of skin of other parts of face: Secondary | ICD-10-CM | POA: Diagnosis not present

## 2014-04-09 DIAGNOSIS — Z85828 Personal history of other malignant neoplasm of skin: Secondary | ICD-10-CM | POA: Diagnosis not present

## 2014-04-15 DIAGNOSIS — I509 Heart failure, unspecified: Secondary | ICD-10-CM | POA: Diagnosis not present

## 2014-04-15 DIAGNOSIS — R7301 Impaired fasting glucose: Secondary | ICD-10-CM | POA: Diagnosis not present

## 2014-04-15 DIAGNOSIS — R030 Elevated blood-pressure reading, without diagnosis of hypertension: Secondary | ICD-10-CM | POA: Diagnosis not present

## 2014-05-01 DIAGNOSIS — G252 Other specified forms of tremor: Secondary | ICD-10-CM | POA: Diagnosis not present

## 2014-05-01 DIAGNOSIS — R269 Unspecified abnormalities of gait and mobility: Secondary | ICD-10-CM | POA: Diagnosis not present

## 2014-05-01 DIAGNOSIS — R41841 Cognitive communication deficit: Secondary | ICD-10-CM | POA: Diagnosis not present

## 2014-05-08 DIAGNOSIS — M1711 Unilateral primary osteoarthritis, right knee: Secondary | ICD-10-CM | POA: Diagnosis not present

## 2014-05-08 DIAGNOSIS — M1712 Unilateral primary osteoarthritis, left knee: Secondary | ICD-10-CM | POA: Diagnosis not present

## 2014-05-18 ENCOUNTER — Emergency Department (HOSPITAL_COMMUNITY): Payer: Medicare Other

## 2014-05-18 ENCOUNTER — Encounter (HOSPITAL_COMMUNITY): Payer: Self-pay

## 2014-05-18 ENCOUNTER — Inpatient Hospital Stay (HOSPITAL_COMMUNITY)
Admission: EM | Admit: 2014-05-18 | Discharge: 2014-05-24 | DRG: 871 | Disposition: A | Discharge status: Home Health | Payer: Medicare Other | Attending: Internal Medicine | Admitting: Internal Medicine

## 2014-05-18 DIAGNOSIS — R1011 Right upper quadrant pain: Secondary | ICD-10-CM

## 2014-05-18 DIAGNOSIS — Z9889 Other specified postprocedural states: Secondary | ICD-10-CM

## 2014-05-18 DIAGNOSIS — R7989 Other specified abnormal findings of blood chemistry: Secondary | ICD-10-CM | POA: Diagnosis not present

## 2014-05-18 DIAGNOSIS — I509 Heart failure, unspecified: Secondary | ICD-10-CM | POA: Diagnosis not present

## 2014-05-18 DIAGNOSIS — R7401 Elevation of levels of liver transaminase levels: Secondary | ICD-10-CM | POA: Diagnosis present

## 2014-05-18 DIAGNOSIS — R51 Headache: Secondary | ICD-10-CM | POA: Diagnosis not present

## 2014-05-18 DIAGNOSIS — K869 Disease of pancreas, unspecified: Secondary | ICD-10-CM | POA: Diagnosis present

## 2014-05-18 DIAGNOSIS — R5381 Other malaise: Secondary | ICD-10-CM | POA: Diagnosis not present

## 2014-05-18 DIAGNOSIS — K8020 Calculus of gallbladder without cholecystitis without obstruction: Secondary | ICD-10-CM | POA: Diagnosis not present

## 2014-05-18 DIAGNOSIS — Z888 Allergy status to other drugs, medicaments and biological substances status: Secondary | ICD-10-CM | POA: Diagnosis not present

## 2014-05-18 DIAGNOSIS — F329 Major depressive disorder, single episode, unspecified: Secondary | ICD-10-CM | POA: Diagnosis present

## 2014-05-18 DIAGNOSIS — Z85038 Personal history of other malignant neoplasm of large intestine: Secondary | ICD-10-CM | POA: Diagnosis not present

## 2014-05-18 DIAGNOSIS — J9811 Atelectasis: Secondary | ICD-10-CM | POA: Diagnosis not present

## 2014-05-18 DIAGNOSIS — A419 Sepsis, unspecified organism: Secondary | ICD-10-CM | POA: Diagnosis present

## 2014-05-18 DIAGNOSIS — R404 Transient alteration of awareness: Secondary | ICD-10-CM | POA: Diagnosis not present

## 2014-05-18 DIAGNOSIS — R079 Chest pain, unspecified: Secondary | ICD-10-CM | POA: Diagnosis not present

## 2014-05-18 DIAGNOSIS — R509 Fever, unspecified: Secondary | ICD-10-CM | POA: Diagnosis not present

## 2014-05-18 DIAGNOSIS — R7881 Bacteremia: Secondary | ICD-10-CM | POA: Diagnosis not present

## 2014-05-18 DIAGNOSIS — Z96619 Presence of unspecified artificial shoulder joint: Secondary | ICD-10-CM | POA: Diagnosis present

## 2014-05-18 DIAGNOSIS — R1084 Generalized abdominal pain: Secondary | ICD-10-CM | POA: Diagnosis not present

## 2014-05-18 DIAGNOSIS — J189 Pneumonia, unspecified organism: Secondary | ICD-10-CM | POA: Diagnosis present

## 2014-05-18 DIAGNOSIS — F99 Mental disorder, not otherwise specified: Secondary | ICD-10-CM | POA: Diagnosis not present

## 2014-05-18 DIAGNOSIS — M1990 Unspecified osteoarthritis, unspecified site: Secondary | ICD-10-CM | POA: Diagnosis present

## 2014-05-18 DIAGNOSIS — A4181 Sepsis due to Enterococcus: Principal | ICD-10-CM | POA: Diagnosis present

## 2014-05-18 DIAGNOSIS — Z885 Allergy status to narcotic agent status: Secondary | ICD-10-CM

## 2014-05-18 DIAGNOSIS — G3183 Dementia with Lewy bodies: Secondary | ICD-10-CM | POA: Diagnosis present

## 2014-05-18 DIAGNOSIS — F0390 Unspecified dementia without behavioral disturbance: Secondary | ICD-10-CM | POA: Diagnosis not present

## 2014-05-18 DIAGNOSIS — R40242 Glasgow coma scale score 9-12: Secondary | ICD-10-CM | POA: Diagnosis not present

## 2014-05-18 DIAGNOSIS — G20 Parkinson's disease: Secondary | ICD-10-CM | POA: Diagnosis present

## 2014-05-18 DIAGNOSIS — Z79899 Other long term (current) drug therapy: Secondary | ICD-10-CM | POA: Diagnosis not present

## 2014-05-18 DIAGNOSIS — G20C Parkinsonism, unspecified: Secondary | ICD-10-CM | POA: Diagnosis present

## 2014-05-18 DIAGNOSIS — Z87891 Personal history of nicotine dependence: Secondary | ICD-10-CM | POA: Diagnosis not present

## 2014-05-18 DIAGNOSIS — R4182 Altered mental status, unspecified: Secondary | ICD-10-CM | POA: Insufficient documentation

## 2014-05-18 DIAGNOSIS — K6289 Other specified diseases of anus and rectum: Secondary | ICD-10-CM | POA: Diagnosis not present

## 2014-05-18 DIAGNOSIS — K868 Other specified diseases of pancreas: Secondary | ICD-10-CM | POA: Diagnosis not present

## 2014-05-18 DIAGNOSIS — R109 Unspecified abdominal pain: Secondary | ICD-10-CM | POA: Diagnosis not present

## 2014-05-18 DIAGNOSIS — Z9049 Acquired absence of other specified parts of digestive tract: Secondary | ICD-10-CM | POA: Diagnosis present

## 2014-05-18 DIAGNOSIS — M549 Dorsalgia, unspecified: Secondary | ICD-10-CM | POA: Diagnosis not present

## 2014-05-18 DIAGNOSIS — R740 Nonspecific elevation of levels of transaminase and lactic acid dehydrogenase [LDH]: Secondary | ICD-10-CM | POA: Diagnosis present

## 2014-05-18 DIAGNOSIS — K8689 Other specified diseases of pancreas: Secondary | ICD-10-CM | POA: Diagnosis present

## 2014-05-18 DIAGNOSIS — R932 Abnormal findings on diagnostic imaging of liver and biliary tract: Secondary | ICD-10-CM | POA: Diagnosis not present

## 2014-05-18 HISTORY — DX: Sepsis, unspecified organism: A41.9

## 2014-05-18 LAB — CBC WITH DIFFERENTIAL/PLATELET
BASOS ABS: 0 10*3/uL (ref 0.0–0.1)
BASOS PCT: 0 % (ref 0–1)
Eosinophils Absolute: 0 10*3/uL (ref 0.0–0.7)
Eosinophils Relative: 0 % (ref 0–5)
HCT: 44.7 % (ref 39.0–52.0)
Hemoglobin: 14.6 g/dL (ref 13.0–17.0)
Lymphocytes Relative: 6 % — ABNORMAL LOW (ref 12–46)
Lymphs Abs: 0.8 10*3/uL (ref 0.7–4.0)
MCH: 31.6 pg (ref 26.0–34.0)
MCHC: 32.7 g/dL (ref 30.0–36.0)
MCV: 96.8 fL (ref 78.0–100.0)
Monocytes Absolute: 0.8 10*3/uL (ref 0.1–1.0)
Monocytes Relative: 6 % (ref 3–12)
NEUTROS ABS: 11.5 10*3/uL — AB (ref 1.7–7.7)
NEUTROS PCT: 88 % — AB (ref 43–77)
PLATELETS: 159 10*3/uL (ref 150–400)
RBC: 4.62 MIL/uL (ref 4.22–5.81)
RDW: 14.5 % (ref 11.5–15.5)
WBC: 13.1 10*3/uL — ABNORMAL HIGH (ref 4.0–10.5)

## 2014-05-18 LAB — COMPREHENSIVE METABOLIC PANEL
ALBUMIN: 4 g/dL (ref 3.5–5.2)
ALK PHOS: 180 U/L — AB (ref 39–117)
ALT: 109 U/L — ABNORMAL HIGH (ref 0–53)
AST: 72 U/L — AB (ref 0–37)
Anion gap: 16 — ABNORMAL HIGH (ref 5–15)
BILIRUBIN TOTAL: 1.4 mg/dL — AB (ref 0.3–1.2)
BUN: 28 mg/dL — ABNORMAL HIGH (ref 6–23)
CHLORIDE: 98 meq/L (ref 96–112)
CO2: 26 mEq/L (ref 19–32)
Calcium: 10.4 mg/dL (ref 8.4–10.5)
Creatinine, Ser: 1.23 mg/dL (ref 0.50–1.35)
GFR calc Af Amer: 59 mL/min — ABNORMAL LOW (ref >90)
GFR calc non Af Amer: 51 mL/min — ABNORMAL LOW (ref >90)
Glucose, Bld: 109 mg/dL — ABNORMAL HIGH (ref 70–99)
POTASSIUM: 5.2 meq/L (ref 3.7–5.3)
Sodium: 140 mEq/L (ref 137–147)
TOTAL PROTEIN: 8.1 g/dL (ref 6.0–8.3)

## 2014-05-18 LAB — URINALYSIS, ROUTINE W REFLEX MICROSCOPIC
BILIRUBIN URINE: NEGATIVE
Glucose, UA: NEGATIVE mg/dL
HGB URINE DIPSTICK: NEGATIVE
Ketones, ur: 15 mg/dL — AB
NITRITE: NEGATIVE
PH: 5.5 (ref 5.0–8.0)
Protein, ur: NEGATIVE mg/dL
SPECIFIC GRAVITY, URINE: 1.021 (ref 1.005–1.030)
Urobilinogen, UA: 0.2 mg/dL (ref 0.0–1.0)

## 2014-05-18 LAB — URINE MICROSCOPIC-ADD ON

## 2014-05-18 LAB — I-STAT CG4 LACTIC ACID, ED: LACTIC ACID, VENOUS: 3.34 mmol/L — AB (ref 0.5–2.2)

## 2014-05-18 LAB — TSH: TSH: 1.31 u[IU]/mL (ref 0.350–4.500)

## 2014-05-18 LAB — CBG MONITORING, ED: GLUCOSE-CAPILLARY: 102 mg/dL — AB (ref 70–99)

## 2014-05-18 LAB — PROTIME-INR
INR: 1.25 (ref 0.00–1.49)
PROTHROMBIN TIME: 15.8 s — AB (ref 11.6–15.2)

## 2014-05-18 MED ORDER — ACETAMINOPHEN 650 MG RE SUPP: 650.0000 mg | Freq: Four times a day (QID) | RECTAL | Status: DC | PRN

## 2014-05-18 MED ORDER — PIPERACILLIN-TAZOBACTAM 3.375 G IVPB
3.3750 g | Freq: Three times a day (TID) | INTRAVENOUS | Status: DC
  Administered 2014-05-18 – 2014-05-22 (×11): 3.375 g via INTRAVENOUS
  Filled 2014-05-18 (×13): qty 50

## 2014-05-18 MED ORDER — HEPARIN SODIUM (PORCINE) 5000 UNIT/ML IJ SOLN
5000.0000 [IU] | Freq: Three times a day (TID) | INTRAMUSCULAR | Status: DC
  Administered 2014-05-18 – 2014-05-24 (×18): 5000 [IU] via SUBCUTANEOUS
  Filled 2014-05-18 (×20): qty 1

## 2014-05-18 MED ORDER — SODIUM CHLORIDE 0.9 % IJ SOLN
3.0000 mL | Freq: Two times a day (BID) | INTRAMUSCULAR | Status: DC
  Administered 2014-05-18 – 2014-05-24 (×8): 3 mL via INTRAVENOUS

## 2014-05-18 MED ORDER — ACETAMINOPHEN 325 MG PO TABS
650.0000 mg | ORAL_TABLET | Freq: Four times a day (QID) | ORAL | Status: DC | PRN
  Administered 2014-05-18 – 2014-05-21 (×2): 650 mg via ORAL
  Filled 2014-05-18 (×3): qty 2

## 2014-05-18 MED ORDER — ACETAMINOPHEN 650 MG RE SUPP
650.0000 mg | Freq: Once | RECTAL | Status: AC
  Administered 2014-05-18: 650 mg via RECTAL
  Filled 2014-05-18: qty 1

## 2014-05-18 MED ORDER — VANCOMYCIN HCL IN DEXTROSE 750-5 MG/150ML-% IV SOLN
750.0000 mg | Freq: Two times a day (BID) | INTRAVENOUS | Status: DC
  Administered 2014-05-19 – 2014-05-20 (×3): 750 mg via INTRAVENOUS
  Filled 2014-05-18 (×5): qty 150

## 2014-05-18 MED ORDER — ONDANSETRON HCL 4 MG/2ML IJ SOLN: 4.0000 mg | Freq: Four times a day (QID) | INTRAMUSCULAR | Status: DC | PRN

## 2014-05-18 MED ORDER — PIPERACILLIN-TAZOBACTAM 3.375 G IVPB 30 MIN
3.3750 g | Freq: Once | INTRAVENOUS | Status: AC
  Administered 2014-05-18: 3.375 g via INTRAVENOUS
  Filled 2014-05-18: qty 50

## 2014-05-18 MED ORDER — SODIUM CHLORIDE 0.9 % IV BOLUS (SEPSIS)
1000.0000 mL | INTRAVENOUS | Status: AC
  Administered 2014-05-18 (×2): 1000 mL via INTRAVENOUS

## 2014-05-18 MED ORDER — ONDANSETRON HCL 4 MG PO TABS: 4.0000 mg | ORAL_TABLET | Freq: Four times a day (QID) | ORAL | Status: DC | PRN

## 2014-05-18 MED ORDER — SODIUM CHLORIDE 0.9 % IV SOLN
INTRAVENOUS | Status: DC
  Administered 2014-05-19 – 2014-05-20 (×2): via INTRAVENOUS

## 2014-05-18 MED ORDER — VANCOMYCIN HCL IN DEXTROSE 1-5 GM/200ML-% IV SOLN
1000.0000 mg | Freq: Once | INTRAVENOUS | Status: AC
  Administered 2014-05-18: 1000 mg via INTRAVENOUS
  Filled 2014-05-18: qty 200

## 2014-05-18 MED ORDER — ALUM & MAG HYDROXIDE-SIMETH 200-200-20 MG/5ML PO SUSP: 30.0000 mL | Freq: Four times a day (QID) | ORAL | Status: DC | PRN

## 2014-05-18 MED ORDER — SODIUM CHLORIDE 0.9 % IV BOLUS (SEPSIS)
500.0000 mL | INTRAVENOUS | Status: AC
  Administered 2014-05-18: 500 mL via INTRAVENOUS

## 2014-05-18 MED ORDER — CARBIDOPA-LEVODOPA 25-100 MG PO TABS
3.0000 | ORAL_TABLET | Freq: Three times a day (TID) | ORAL | Status: DC
  Administered 2014-05-19 – 2014-05-24 (×17): 3 via ORAL
  Filled 2014-05-18 (×23): qty 3

## 2014-05-18 MED ORDER — MORPHINE SULFATE 2 MG/ML IJ SOLN
1.0000 mg | INTRAMUSCULAR | Status: DC | PRN
Start: 2014-05-18 — End: 2014-05-21

## 2014-05-18 MED ORDER — CARBIDOPA-LEVODOPA ER 25-100 MG PO TBCR
1.0000 | EXTENDED_RELEASE_TABLET | Freq: Every day | ORAL | Status: DC
  Administered 2014-05-18 – 2014-05-23 (×5): 1 via ORAL
  Filled 2014-05-18 (×11): qty 1

## 2014-05-18 NOTE — Progress Notes (Signed)
Report called and received from ED. 

## 2014-05-18 NOTE — ED Notes (Signed)
Per [facility]: pt. Was weak in shower early AM, pt. Slept most of day, pt. Did not ambulate well to bed yesterday PM. This AM the pt. Would not get out of his bed. Family reports decreased LOC. EMS noted R sided gaze and droop. Pt. Has exelon patch to R shoulder and stage 1 pressure ulcer on sacrum.

## 2014-05-18 NOTE — ED Provider Notes (Signed)
Level V caveat altered mental status. History is obtained from paramedics. Patient was generally weak starting yesterday. He was unable to stand this morning and appeared more confused family upon awakening. History of Parkinson's disease. Patient denies pain anywhere. Denies difficulty breathing. On exam ill-appearing noted to be febrile HEENT exam the case membranes dry pink nonicteric eyes extraocular muscles intact neck supple lungs clear auscultation heart regular rate and rhythm abdomen nondistended nontender skin no rash is probably 1 cm diameter stage I presacral decubitus ulcer. Neurologic oriented [facility] does not know month.follows simple commands. Moves all extremities with the exception of left lower extremity. He does not move left lower extremity on command. Code sepsis called based on fever, altered mental status  Orlie Dakin, MD 05/18/14 1737

## 2014-05-18 NOTE — Progress Notes (Signed)
ANTIBIOTIC CONSULT NOTE - INITIAL  Pharmacy Consult for Vancomycin and Zosyn Indication: sepsis  Allergies  Allergen Reactions  . Amantadines Other (See Comments)    Weakness in legs and hallucinations  . Fentanyl Other (See Comments)    Confusion and disorientation   . Gabapentin Other (See Comments)    Weakness in legs and hallucinations  . Oxycodone     Confusion and disorientation   . Phenergan [Promethazine Hcl]     Confusion and disorientation     Patient Measurements: Height: 5\' 7"  (170.2 cm) Weight: 180 lb (81.647 kg) IBW/kg (Calculated) : 66.1  Vital Signs: Temp: 102.1 F (38.9 C) (11/15 1458) Temp Source: Rectal (11/15 1458) BP: 149/56 mmHg (11/15 1559) Pulse Rate: 88 (11/15 1559) Intake/Output from previous day:   Intake/Output from this shift:    Labs:  Recent Labs  05/18/14 1534  WBC 13.1*  HGB 14.6  PLT 159  CREATININE 1.23   Estimated Creatinine Clearance: 42.5 mL/min (by C-G formula based on Cr of 1.23). No results for input(s): VANCOTROUGH, VANCOPEAK, VANCORANDOM, GENTTROUGH, GENTPEAK, GENTRANDOM, TOBRATROUGH, TOBRAPEAK, TOBRARND, AMIKACINPEAK, AMIKACINTROU, AMIKACIN in the last 72 hours.   Microbiology: No results found for this or any previous visit (from the past 720 hour(s)).  Medical History: Past Medical History  Diagnosis Date  . Parkinson disease   . Dementia   . Depression   . Bowel obstruction 12/15/2011  . Neuromuscular disorder     hx of parkinsons  . Cancer     hx of colon cancer  . Arthritis     Medications:  See electronic med rec  Assessment: 80 y.o. male presents with AMS, fever - code sepsis called. To continue Vancomycin and Zosyn. Pt received Vanc 1gm and Zosyn 3.375gm IV ~1540 in ED.   Goal of Therapy:  Vancomycin trough level 15-20 mcg/ml  Plan:  1) Zosyn 3.375gm IV q8h - each dose over 4 hours 2) Vancomycin 750mg  IV q12h 3) Will f/u micro data, renal function, pt's clinical condition 4) Vanc trough  prn  Sherlon Handing, PharmD, BCPS Clinical pharmacist, pager [phone] 05/18/2014,4:35 PM

## 2014-05-18 NOTE — H&P (Signed)
[facility] History and Physical  WILHO SHARPLEY XHB:[id number] DOB: October 15, 1923 DOA: 05/18/2014  Referring physician: EDP PCP: [name], MD   Chief Complaint: fever and weakness  HPI: DEMERE DOTZLER is a 80 y.o. male with past mental history of parkinsonism, multiple spinal surgeries, dementia and history of colon cancer status post resection. Patient brought to the hospital by his family for weakness. Patient is sleepy easy to arouse but has short attention span, most of the history was obtained from the wife at bedside. She reported he was weak yesterday, he was unable to get off of his chair to the point she called EMS to help to get him up. Today also he was very weak and not acting himself, her temperature so she brought him to the hospital for further evaluation. Patient denies any cough, denies any shortness of breath, denies any burning while passing urine but reported feeling very weak. In the ED blood work showed mild leukocytosis with 13.1, CXR appears to have RLL infiltrates but the radiologist reading is  Elevation of the right hemidiaphragm. He has fever of 102.1, lactic acid of 3.34, patient admitted to the hospital for further evaluation.  Review of Systems:  ROS are unobtainable because of patient lethargy  Past Medical History  Diagnosis Date  . Parkinson disease   . Dementia   . Depression   . Bowel obstruction 12/15/2011  . Neuromuscular disorder     hx of parkinsons  . Cancer     hx of colon cancer  . Arthritis    Past Surgical History  Procedure Laterality Date  . Eye surgery    . Cataracts    . Hernia repair    . Back surgery      x 5  . Frozen shoulder    . Vasectomy    . Total shoulder arthroplasty    . Lumbar fusion    . Spinal cord stimulator implant    . Colon surgery     Social History:   reports that he has quit smoking. He has never used smokeless tobacco. He reports that he does not drink alcohol or use illicit drugs.  Allergies   Allergen Reactions  . Amantadines Other (See Comments)    Weakness in legs and hallucinations  . Fentanyl Other (See Comments)    Confusion and disorientation   . Gabapentin Other (See Comments)    Weakness in legs and hallucinations  . Oxycodone     Confusion and disorientation   . Phenergan [Promethazine Hcl]     Confusion and disorientation     No family history on file.   Prior to Admission medications   Medication Sig Start Date End Date Taking? Authorizing Provider  BIOTIN PO Take 2 tablets by mouth every evening.    Yes Historical Provider, MD  calcium carbonate (OS-CAL) 600 MG TABS Take 600 mg by mouth 2 (two) times daily with a meal.   Yes Historical Provider, MD  carbidopa-levodopa (SINEMET CR) 25-100 MG per tablet Take 1 tablet by mouth at bedtime.   Yes Historical Provider, MD  carbidopa-levodopa (SINEMET IR) 25-100 MG per tablet Take 3 tablets by mouth 3 (three) times daily.   Yes Historical Provider, MD  celecoxib (CELEBREX) 200 MG capsule Take 200 mg by mouth daily with supper.   Yes Historical Provider, MD  cholecalciferol (VITAMIN D) 1000 UNITS tablet Take 1,000 Units by mouth daily.    Yes Historical Provider, MD  DULoxetine (CYMBALTA) 60 MG capsule Take 60 mg  by mouth 2 (two) times daily.   Yes Historical Provider, MD  Multiple Vitamin (MULTIVITAMIN WITH MINERALS) TABS Take 1 tablet by mouth daily.   Yes Historical Provider, MD  Multiple Vitamins-Minerals (ICAPS AREDS FORMULA PO) Take 1 capsule by mouth 2 (two) times daily.   Yes Historical Provider, MD  pregabalin (LYRICA) 150 MG capsule Take 150 mg by mouth 2 (two) times daily.   Yes Historical Provider, MD  rivastigmine (EXELON) 9.5 mg/24hr Place 1 patch onto the skin daily.   Yes Historical Provider, MD  zinc gluconate 50 MG tablet Take 100 mg by mouth daily.    Yes Historical Provider, MD   Physical Exam: Filed Vitals:   05/18/14 1715  BP: 137/62  Pulse: 82  Temp:   Resp: 15   Constitutional: lethargic,  but easy to arouse.  Head: Normocephalic and atraumatic.  Nose: Nose normal.  Mouth/Throat: Uvula is midline, oropharynx is clear and moist and mucous membranes are normal.  Eyes: Conjunctivae and EOM are normal. Pupils are equal, round, and reactive to light.  Neck: Trachea normal and normal range of motion. Neck supple.  Cardiovascular: Normal rate, regular rhythm, S1 normal, S2 normal, normal heart sounds and intact distal pulses.   Pulmonary/Chest: Effort normal and breath sounds normal.  Abdominal: Soft. Bowel sounds are normal. There is no hepatosplenomegaly. There is no tenderness.  Musculoskeletal: Normal range of motion.  Neurological: lethargic but do to arouse, deferred exam. Had mild right facial droop, wife reported this is chronic. Skin: Skin is warm, dry and intact.  Psychiatric: Has a normal mood and affect. Speech is normal and behavior is normal.   Labs on Admission:  Basic Metabolic Panel:  Recent Labs Lab 05/18/14 1534  NA 140  K 5.2  CL 98  CO2 26  GLUCOSE 109*  BUN 28*  CREATININE 1.23  CALCIUM 10.4   Liver Function Tests:  Recent Labs Lab 05/18/14 1534  AST 72*  ALT 109*  ALKPHOS 180*  BILITOT 1.4*  PROT 8.1  ALBUMIN 4.0   No results for input(s): LIPASE, AMYLASE in the last 168 hours. No results for input(s): AMMONIA in the last 168 hours. CBC:  Recent Labs Lab 05/18/14 1534  WBC 13.1*  NEUTROABS 11.5*  HGB 14.6  HCT 44.7  MCV 96.8  PLT 159   Cardiac Enzymes: No results for input(s): CKTOTAL, CKMB, CKMBINDEX, TROPONINI in the last 168 hours.  BNP (last 3 results) No results for input(s): PROBNP in the last 8760 hours. CBG:  Recent Labs Lab 05/18/14 1501  GLUCAP 102*    Radiological Exams on Admission: Ct Head Wo Contrast  05/18/2014   CLINICAL DATA:  Altered mental status.  EXAM: CT HEAD WITHOUT CONTRAST  TECHNIQUE: Contiguous axial images were obtained from the base of the skull through the vertex without intravenous  contrast.  COMPARISON:  09/27/2012  FINDINGS: Mild cerebral atrophy is unchanged pain within normal limits for age. Periventricular white-matter hypodensities are nonspecific but compatible with minimal chronic small vessel ischemic disease, within normal limits for age. There is no evidence of acute cortical infarct, intracranial hemorrhage, mass, midline shift, or extra-axial fluid collection.  Mastoid air cells are clear. Polypoid mucosal thickening/ mucous retention cysts are noted in the right greater than left maxillary sinuses. Prior bilateral cataract extraction is noted.  IMPRESSION: No evidence of acute intracranial abnormality.   Electronically Signed   By: Logan Bores   On: 05/18/2014 16:50   Dg Chest Port 1 View  05/18/2014  CLINICAL DATA:  Altered mental status.  EXAM: PORTABLE CHEST - 1 VIEW  COMPARISON:  11/27/2008  FINDINGS: The cardiomediastinal silhouette is within normal limits. The low patient has taken a shallower inspiration than on the prior study, and there is new elevation of the right hemidiaphragm. Mild opacity is present in the right greater than left lung bases. No definite pleural effusion or pneumothorax is identified. Spinal stimulator is partially visualized. Severe right glenohumeral degenerative change is noted. Prior left shoulder arthroplasty is noted.  IMPRESSION: Shallow inspiration with elevation of the right hemidiaphragm and bibasilar atelectasis.   Electronically Signed   By: Logan Bores   On: 05/18/2014 16:46    EKG: Independently reviewed.   Assessment/Plan Principal Problem:   Sepsis Active Problems:   Physical deconditioning   Parkinsonism   Dementia without behavioral disturbance   Transaminitis    Sepsis Present on admission with temperature of 102.1, looks a total of 13.1 and probable infection. Lactic acid is 3.34, no hypotension. Urine and blood cultures obtained. Patient is started on broad-spectrum antibiotics. Cannot rule out right  lower lobe pneumonia, no viral symptoms to look for viral bronchitis/pneumonia.  Transaminitis Unclear etiology, Obtain RUQ ultrasound. His abdominal examination is benign no guarding or significant tenderness.  Dementia Had problems of sundowning before when he comes to the hospital especially with narcotics. Anticipate he will have acute delirium/confusion during this hospitalization as well.  Parkinsonism This is stable chronic condition, continue home medications. Wife denies any history of dysphagia, no cough while eating.    Code Status: full code Family Communication: discussed with the family in the presence of his wife at bedside. Disposition Plan: telemetry, inpatient.  Time spent: 70 minutes  [facility] Pager [phone]

## 2014-05-18 NOTE — ED Notes (Signed)
CG-4 reported to Dr. Winfred Leeds

## 2014-05-18 NOTE — Progress Notes (Signed)
Paul Callahan [id number] Code Status: Fullo   Admission Data: 05/18/2014 6:38 PM Attending Provider:  Elmahi YVO:[id number],MMNOT, MD Consults/ Treatment Team:    Paul Callahan is a 80 y.o. male patient admitted from ED awake, alert - oriented  X 3 - no acute distress noted.  VSS - Blood pressure 137/62, pulse 82, temperature 99.1 F (37.3 C), temperature source Oral, resp. rate 15, height 5\' 7"  (1.702 m), weight 81.647 kg (180 lb), SpO2 93 %.    IV in place, occlusive dsg intact without redness.  Orientation to room, and floor completed with information packet given to patient/family.   Admission INP armband ID verified with patient/family, and in place.   SR up x 2, fall assessment complete, with patient and family able to verbalize understanding of risk associated with falls, and verbalized understanding to call nsg before up out of bed.  Call light within reach, patient able to voice, and demonstrate understanding.       Will cont to eval and treat per MD orders.  Henriette Combs, [place] 05/18/2014 6:38 PM

## 2014-05-18 NOTE — ED Provider Notes (Signed)
CSN: [id number]     Arrival date & time 05/18/14  1456 History   First MD Initiated Contact with Patient 05/18/14 1505     Chief Complaint  Patient presents with  . Altered Mental Status    Patient is a 80 y.o. male presenting with altered mental status. The history is provided by the EMS personnel.  Altered Mental Status Presenting symptoms: behavior changes, lethargy and partial responsiveness   Severity:  Severe Most recent episode:  Yesterday Episode history:  Continuous Duration:  2 days Timing:  Constant Progression:  Worsening Chronicity:  New Context: not homeless   Associated symptoms: bladder incontinence, fever, rash and weakness (global)   Associated symptoms: no eye deviation and no vomiting   Fever:    Timing:  Unable to specify   Temp source:  Unable to specify Rash:    Location:  Full body   Quality: redness     Quality comment:  Blanches   Onset quality:  Unable to specify   Timing:  Unable to specify   Progression:  Unable to specify Weakness:    Severity:  Severe   Onset quality:  Gradual   Duration:  2 days   Timing:  Constant   Progression:  Worsening   Chronicity:  New   Past Medical History  Diagnosis Date  . Parkinson disease   . Dementia   . Depression   . Bowel obstruction 12/15/2011  . Neuromuscular disorder     hx of parkinsons  . Cancer     hx of colon cancer  . Arthritis    Past Surgical History  Procedure Laterality Date  . Eye surgery    . Cataracts    . Hernia repair    . Back surgery      x 5  . Frozen shoulder    . Vasectomy    . Total shoulder arthroplasty    . Lumbar fusion    . Spinal cord stimulator implant     No family history on file. History  Substance Use Topics  . Smoking status: Former Research scientist (life sciences)  . Smokeless tobacco: Never Used  . Alcohol Use: No    Review of Systems  Unable to perform ROS: Mental status change  Constitutional: Positive for fever.  Gastrointestinal: Negative for vomiting.   Genitourinary: Positive for bladder incontinence.  Skin: Positive for rash.  Neurological: Positive for weakness (global).  The patient denies pain anywhere  Allergies  Amantadines; Fentanyl; Gabapentin; Oxycodone; and Phenergan  Home Medications   Prior to Admission medications   Medication Sig Start Date End Date Taking? Authorizing Provider  BIOTIN PO Take 1 tablet by mouth every evening.    Historical Provider, MD  calcium carbonate (OS-CAL) 600 MG TABS Take 600 mg by mouth 2 (two) times daily with a meal.    Historical Provider, MD  carbidopa-levodopa (SINEMET CR) 25-100 MG per tablet Take 1 tablet by mouth at bedtime.    Historical Provider, MD  carbidopa-levodopa (SINEMET IR) 25-100 MG per tablet Take 3 tablets by mouth 3 (three) times daily.    Historical Provider, MD  celecoxib (CELEBREX) 200 MG capsule Take 200 mg by mouth daily with supper.    Historical Provider, MD  cholecalciferol (VITAMIN D) 1000 UNITS tablet Take 2,000 Units by mouth daily.    Historical Provider, MD  DULoxetine (CYMBALTA) 60 MG capsule Take 60 mg by mouth 2 (two) times daily.    Historical Provider, MD  Multiple Vitamin (MULTIVITAMIN WITH MINERALS) TABS Take 1  tablet by mouth daily.    Historical Provider, MD  pregabalin (LYRICA) 150 MG capsule Take 150 mg by mouth 2 (two) times daily.    Historical Provider, MD  rivastigmine (EXELON) 9.5 mg/24hr Place 1 patch onto the skin daily.    Historical Provider, MD  zinc gluconate 50 MG tablet Take 50 mg by mouth daily.    Historical Provider, MD   Ht 5\' 7"  (1.702 m)  Wt 180 lb (81.647 kg)  BMI 28.19 kg/m2  SpO2 100% Rectal Temp 102.1 Physical Exam  Constitutional: He is oriented to person, place, and time. He appears well-developed and well-nourished. No distress.  HENT:  Head: Normocephalic and atraumatic.  Right Ear: External ear normal.  Left Ear: External ear normal.  Nose: Nose normal.  Mouth/Throat: Mucous membranes are dry.  Neck: Normal range  of motion.  Cardiovascular: Normal rate and regular rhythm.   Pulses:      Radial pulses are 2+ on the right side, and 2+ on the left side.  Pulmonary/Chest: Effort normal and breath sounds normal. No respiratory distress. He has no decreased breath sounds. He has no wheezes. He has no rales.  Abdominal: Soft. He exhibits no distension. There is no tenderness. There is no rebound and no guarding.  Genitourinary:  Stage I pressure ulcer on sacrum  Musculoskeletal:  Multiple midline scars over lower back  Neurological: He is alert and oriented to person, place, and time. GCS eye subscore is 4. GCS verbal subscore is 4. GCS motor subscore is 6.  Global weakness; equal strength in all major muscle groups of upper and lower extremities with the exception of straight leg raise -- he will not raise the left leg; face symmetric; no tongue deviation; oriented to person, not place, time or situation  Skin: Skin is warm and dry. No rash noted. He is not diaphoretic.  Psychiatric: He has a normal mood and affect.  Vitals reviewed.   ED Course  Procedures   Labs Review Labs Reviewed  CBC WITH DIFFERENTIAL - Abnormal; Notable for the following:    WBC 13.1 (*)    Neutrophils Relative % 88 (*)    Neutro Abs 11.5 (*)    Lymphocytes Relative 6 (*)    All other components within normal limits  COMPREHENSIVE METABOLIC PANEL - Abnormal; Notable for the following:    Glucose, Bld 109 (*)    BUN 28 (*)    AST 72 (*)    ALT 109 (*)    Alkaline Phosphatase 180 (*)    Total Bilirubin 1.4 (*)    GFR calc non Af Amer 51 (*)    GFR calc Af Amer 59 (*)    Anion gap 16 (*)    All other components within normal limits  URINALYSIS, ROUTINE W REFLEX MICROSCOPIC - Abnormal; Notable for the following:    Color, Urine AMBER (*)    Ketones, ur 15 (*)    Leukocytes, UA TRACE (*)    All other components within normal limits  CBG MONITORING, ED - Abnormal; Notable for the following:    Glucose-Capillary 102 (*)     All other components within normal limits  I-STAT CG4 LACTIC ACID, ED - Abnormal; Notable for the following:    Lactic Acid, Venous 3.34 (*)    All other components within normal limits  CULTURE, BLOOD (ROUTINE X 2)  CULTURE, BLOOD (ROUTINE X 2)  URINE CULTURE  URINE MICROSCOPIC-ADD ON  PROTIME-INR  I-STAT CG4 LACTIC ACID, ED  Imaging Review Ct Head Wo Contrast  05/18/2014   CLINICAL DATA:  Altered mental status.  EXAM: CT HEAD WITHOUT CONTRAST  TECHNIQUE: Contiguous axial images were obtained from the base of the skull through the vertex without intravenous contrast.  COMPARISON:  09/27/2012  FINDINGS: Mild cerebral atrophy is unchanged pain within normal limits for age. Periventricular white-matter hypodensities are nonspecific but compatible with minimal chronic small vessel ischemic disease, within normal limits for age. There is no evidence of acute cortical infarct, intracranial hemorrhage, mass, midline shift, or extra-axial fluid collection.  Mastoid air cells are clear. Polypoid mucosal thickening/ mucous retention cysts are noted in the right greater than left maxillary sinuses. Prior bilateral cataract extraction is noted.  IMPRESSION: No evidence of acute intracranial abnormality.   Electronically Signed   By: Logan Bores   On: 05/18/2014 16:50   Dg Chest Port 1 View  05/18/2014   CLINICAL DATA:  Altered mental status.  EXAM: PORTABLE CHEST - 1 VIEW  COMPARISON:  11/27/2008  FINDINGS: The cardiomediastinal silhouette is within normal limits. The low patient has taken a shallower inspiration than on the prior study, and there is new elevation of the right hemidiaphragm. Mild opacity is present in the right greater than left lung bases. No definite pleural effusion or pneumothorax is identified. Spinal stimulator is partially visualized. Severe right glenohumeral degenerative change is noted. Prior left shoulder arthroplasty is noted.  IMPRESSION: Shallow inspiration with  elevation of the right hemidiaphragm and bibasilar atelectasis.   Electronically Signed   By: Logan Bores   On: 05/18/2014 16:46     EKG Interpretation   Date/Time:  Sunday May 18 2014 14:59:39 EST Ventricular Rate:  86 PR Interval:  156 QRS Duration: 85 QT Interval:  428 QTC Calculation: 512 R Axis:   26 Text Interpretation:  Sinus rhythm Borderline repolarization abnormality  Prolonged QT interval No significant change since last tracing Confirmed  by Winfred Leeds  MD, SAM [phone]) on 05/18/2014 3:21:39 PM      MDM   Final diagnoses:  Altered mental status   80 y.o. male with a past medical history of Parkinson disease, dementia, depression, colon cancer. Presents to the ED due to generalized weakness, fever, altered mental status. Per his wife yesterday he became weak while in the shower and was lowered down to his bottom. May have fell landing on his bottom. No head or neck trauma. Since then he has progressively gotten more and more weak, sleeping most of the day and night. Today he was "not acting right". At this time his wife called 911.  On arrival he is febrile, requiring supplemental oxygen. He is awake, follows commands. Remainder of exam as above.  Code sepsis was called and he was treated with broad-spectrum antibiotics. He was given fluid resuscitation. Given rectal Tylenol.  Chest x-ray with opacity at the right lower lobe however difficult to assess given poor inspiratory effort. X-ray was read as atelectatic.  Urinalysis showed no sign of infection. Lactic acid elevated at 3.34. CMP notable for elevated LFTs. CT head showed no acute intracranial abnormality.   Given his presentation with altered mental status and fever, we treated him for severe sepsis. He was admitted to the hospitalist. His hemodynamics remained acceptable in the ED. He never became hypotensive or hypoxic. He was weaned to room air.   Filed Vitals:   05/18/14 1559 05/18/14 1636 05/18/14  1700 05/18/14 1715  BP: 149/56 132/51 123/54 137/62  Pulse: 88 83 83 82  Temp:  99.1 F (37.3 C)    TempSrc:  Oral    Resp: 18 15 13 15   Height:      Weight:      SpO2: 98% 98% 92% 93%   This case was managed in conjunction with my attending, Dr. Winfred Leeds.     Berenice Primas, MD 05/19/14 [place], MD 05/19/14 [phone]

## 2014-05-19 ENCOUNTER — Inpatient Hospital Stay (HOSPITAL_COMMUNITY): Payer: Medicare Other

## 2014-05-19 LAB — BASIC METABOLIC PANEL
Anion gap: 11 (ref 5–15)
Anion gap: 14 (ref 5–15)
BUN: 23 mg/dL (ref 6–23)
BUN: 20 mg/dL (ref 6–23)
CALCIUM: 8.8 mg/dL (ref 8.4–10.5)
CHLORIDE: 103 meq/L (ref 96–112)
CO2: 24 mEq/L (ref 19–32)
CO2: 23 mEq/L (ref 19–32)
CREATININE: 1.1 mg/dL (ref 0.50–1.35)
CREATININE: 1.01 mg/dL (ref 0.50–1.35)
Calcium: 8.7 mg/dL (ref 8.4–10.5)
Chloride: 102 mEq/L (ref 96–112)
GFR calc Af Amer: 67 mL/min — ABNORMAL LOW (ref >90)
GFR calc Af Amer: 74 mL/min — ABNORMAL LOW (ref >90)
GFR calc non Af Amer: 58 mL/min — ABNORMAL LOW (ref >90)
GFR calc non Af Amer: 64 mL/min — ABNORMAL LOW (ref >90)
GLUCOSE: 128 mg/dL — AB (ref 70–99)
GLUCOSE: 122 mg/dL — AB (ref 70–99)
POTASSIUM: 6.4 meq/L — AB (ref 3.7–5.3)
Potassium: 4.1 mEq/L (ref 3.7–5.3)
Sodium: 138 mEq/L (ref 137–147)
Sodium: 139 mEq/L (ref 137–147)

## 2014-05-19 LAB — CBC
HCT: 38 % — ABNORMAL LOW (ref 39.0–52.0)
HEMOGLOBIN: 12.4 g/dL — AB (ref 13.0–17.0)
MCH: 31.6 pg (ref 26.0–34.0)
MCHC: 32.6 g/dL (ref 30.0–36.0)
MCV: 96.9 fL (ref 78.0–100.0)
Platelets: 135 10*3/uL — ABNORMAL LOW (ref 150–400)
RBC: 3.92 MIL/uL — AB (ref 4.22–5.81)
RDW: 14.7 % (ref 11.5–15.5)
WBC: 9.2 10*3/uL (ref 4.0–10.5)

## 2014-05-19 MED ORDER — DIPHENHYDRAMINE HCL 25 MG PO CAPS
25.0000 mg | ORAL_CAPSULE | Freq: Once | ORAL | Status: AC
  Administered 2014-05-19: 25 mg via ORAL
  Filled 2014-05-19: qty 1

## 2014-05-19 MED ORDER — DIPHENHYDRAMINE HCL 25 MG PO CAPS
25.0000 mg | ORAL_CAPSULE | Freq: Four times a day (QID) | ORAL | Status: DC | PRN
  Administered 2014-05-19: 25 mg via ORAL
  Filled 2014-05-19: qty 1

## 2014-05-19 NOTE — Progress Notes (Signed)
Utilization review complete. Retal Tonkinson RN CCM Case Mgmt phone [phone] 

## 2014-05-19 NOTE — Progress Notes (Signed)
PROGRESS NOTE  BEREKET GERNERT WJX:[id number] DOB: Jun 30, 1924 DOA: 05/18/2014 PCP: [name], MD  HPI/Subjective: 80 yo male with past medical history of parkinsonism, demenitia, multiple spinal surgeries, and hx of colon cancer s/p resection.  Brought to the ED by family for weakness and not acting himself.  Wife reported that he was very weak over the past two days and noted he had an elevated temperature yesterday when she decided to bring him to the hospital.  Pt denies any cough, shortness of breath, or dysuria.  Blood work in the ED showed mild leukocytosis with 13.1, CXR appears to have RLL infiltrates     Pt reports feeling somewhat improved but still does not feel well.  Denies any shortness of breath, fever, chills, or sweats.  Daughter does think pt has been coughing more this morning and that he is still very lethargic.        Assessment/Plan:  Sepsis Present on admission.  Likely from RLL pneumonia.   Temperature of 102.1, WBC of 13.1, and lactic acid of 3.34 on admission.  Tmax overnight 101.2.  VSSAF currently.  WBC 9.2 on 11/16.  CXR shows RLL infiltrates.  UA shows trace leuks but no other findings.   Blood culture preliminary result shows NGTD.  Urine culture pending.  Continue IV Vancomycin and Zosyn.  Continue to monitor CBC.        Transaminitis Unknown cause.  AST 72 and ALT 109.  Abdominal exam is unremarkable.  No distention, tenderness, or guarding.  RUQ abd u/s shows cholelithiasis with no signs of acute cholecystitis.  Right lobe of liver shows no abnormalities, left lobe not visualized.  Will monitor CMP.         Dementia Chronic problem.  Prior problems with sundowning in the hospital especially with narcotics.   Currently using Exelon patch at home.    Parkinsonism Chronic problem.  Stable.  Wife reports no hx of dysphagia or cough while eating. Continue on Carbidopa/Levodopa immediate and controlled release tablets.      DVT Prophylaxis:  Heparin  5,000 units q8h  Code Status: Full Family Communication: Daughter and son at bedside.   Disposition Plan: Will discharge home when medically appropriate.    Consultants:  None  Procedures:  RUQ abd u/s  Antibiotics:  IV Vancomycin and Zosyn  Objective: Filed Vitals:   05/18/14 2029 05/18/14 2257 05/19/14 0417 05/19/14 0654  BP:  132/66 138/72   Pulse:  91 80   Temp: 101.2 F (38.4 C) 99.5 F (37.5 C) 98.1 F (36.7 C) 98.7 F (37.1 C)  TempSrc: Oral Oral Oral Oral  Resp:  18 18   Height:      Weight:      SpO2:  93% 94%     Intake/Output Summary (Last 24 hours) at 05/19/14 1050 Last data filed at 05/19/14 1017  Gross per 24 hour  Intake      3 ml  Output    100 ml  Net    -97 ml   Filed Weights   05/18/14 1509  Weight: 81.647 kg (180 lb)    Exam: General: Drowsy/sedated, NAD, appears stated age  9:  Anicteic Sclera, MMM.  Cardiovascular: RRR, S1 S2 auscultated, no rubs, murmurs or gallops.   Respiratory: Clear to auscultation bilaterally with equal chest rise  Abdomen: Soft, nontender, nondistended, + bowel sounds  Extremities: warm dry without cyanosis clubbing or edema.  Neuro: AAOx3, cranial nerves grossly intact. Strength 5/5 in upper extremities  Skin: Without  rashes exudates or nodules.     Data Reviewed: Basic Metabolic Panel:  Recent Labs Lab 05/18/14 1534 05/19/14 0445 05/19/14 0826  NA 140 138 139  K 5.2 6.4* 4.1  CL 98 103 102  CO2 26 24 23   GLUCOSE 109* 128* 122*  BUN 28* 23 20  CREATININE 1.23 1.10 1.01  CALCIUM 10.4 8.7 8.8   Liver Function Tests:  Recent Labs Lab 05/18/14 1534  AST 72*  ALT 109*  ALKPHOS 180*  BILITOT 1.4*  PROT 8.1  ALBUMIN 4.0   CBC:  Recent Labs Lab 05/18/14 1534 05/19/14 0445  WBC 13.1* 9.2  NEUTROABS 11.5*  --   HGB 14.6 12.4*  HCT 44.7 38.0*  MCV 96.8 96.9  PLT 159 135*   CBG:  Recent Labs Lab 05/18/14 1501  GLUCAP 102*    Recent Results (from the past 240 hour(s))    Blood Culture (routine x 2)     Status: None (Preliminary result)   Collection Time: 05/18/14  3:30 PM  Result Value Ref Range Status   Specimen Description BLOOD RIGHT ARM  Final   Special Requests BOTTLES DRAWN AEROBIC AND ANAEROBIC 5 CC EACH  Final   Culture  Setup Time   Final    05/18/2014 23:28 Performed at [facility]    Culture   Final           BLOOD CULTURE RECEIVED NO GROWTH TO DATE CULTURE WILL BE HELD FOR 5 DAYS BEFORE ISSUING A FINAL NEGATIVE REPORT Performed at [facility]    Report Status PENDING  Incomplete  Blood Culture (routine x 2)     Status: None (Preliminary result)   Collection Time: 05/18/14  3:34 PM  Result Value Ref Range Status   Specimen Description BLOOD LEFT HAND  Final   Special Requests BOTTLES DRAWN AEROBIC AND ANAEROBIC 5 CC  Final   Culture  Setup Time   Final    05/18/2014 23:28 Performed at [facility]    Culture   Final           BLOOD CULTURE RECEIVED NO GROWTH TO DATE CULTURE WILL BE HELD FOR 5 DAYS BEFORE ISSUING A FINAL NEGATIVE REPORT Performed at [facility]    Report Status PENDING  Incomplete     Studies: Ct Head Wo Contrast  05/18/2014   CLINICAL DATA:  Altered mental status.  EXAM: CT HEAD WITHOUT CONTRAST  TECHNIQUE: Contiguous axial images were obtained from the base of the skull through the vertex without intravenous contrast.  COMPARISON:  09/27/2012  FINDINGS: Mild cerebral atrophy is unchanged pain within normal limits for age. Periventricular white-matter hypodensities are nonspecific but compatible with minimal chronic small vessel ischemic disease, within normal limits for age. There is no evidence of acute cortical infarct, intracranial hemorrhage, mass, midline shift, or extra-axial fluid collection.  Mastoid air cells are clear. Polypoid mucosal thickening/ mucous retention cysts are noted in the right greater than left maxillary sinuses. Prior bilateral cataract extraction is noted.   IMPRESSION: No evidence of acute intracranial abnormality.   Electronically Signed   By: Logan Bores   On: 05/18/2014 16:50   Dg Chest Port 1 View  05/18/2014   CLINICAL DATA:  Altered mental status.  EXAM: PORTABLE CHEST - 1 VIEW  COMPARISON:  11/27/2008  FINDINGS: The cardiomediastinal silhouette is within normal limits. The low patient has taken a shallower inspiration than on the prior study, and there is new elevation of the right hemidiaphragm.  Mild opacity is present in the right greater than left lung bases. No definite pleural effusion or pneumothorax is identified. Spinal stimulator is partially visualized. Severe right glenohumeral degenerative change is noted. Prior left shoulder arthroplasty is noted.  IMPRESSION: Shallow inspiration with elevation of the right hemidiaphragm and bibasilar atelectasis.   Electronically Signed   By: Logan Bores   On: 05/18/2014 16:46   US Abdomen Limited Ruq  05/19/2014   CLINICAL DATA:  Transaminitis.  History of colon cancer.  EXAM: US ABDOMEN LIMITED - RIGHT UPPER QUADRANT  COMPARISON:  CT abdomen and pelvis 12/16/2011  FINDINGS: Examination was limited due to patient's inability to move or take in a deep breath.  Gallbladder:  Multiple stones were present measuring up to 1.7 cm in size, including a 1.3 cm stone at the gallbladder neck. Gallbladder wall was not thickened, measuring 2.9 mm. Sonographic Murphy sign was negative.  Common bile duct:  Diameter: 3.5 mm  Liver:  Suboptimal evaluation with nonvisualization of the left lobe. No gross abnormalities identified in the visualized right lobe.  IMPRESSION: 1. Cholelithiasis without other evidence of acute cholecystitis. No biliary dilatation. 2. Limited evaluation of the liver as above.   Electronically Signed   By: Logan Bores   On: 05/19/2014 08:58    Scheduled Meds: . carbidopa-levodopa  3 tablet Oral TID  . Carbidopa-Levodopa ER  1 tablet Oral QHS  . heparin  5,000 Units Subcutaneous 3 times per  day  . piperacillin-tazobactam (ZOSYN)  IV  3.375 g Intravenous 3 times per day  . sodium chloride  3 mL Intravenous Q12H  . vancomycin  750 mg Intravenous Q12H   Continuous Infusions: . sodium chloride 100 mL/hr at 05/19/14 0559    Principal Problem:   Sepsis Active Problems:   Physical deconditioning   Parkinsonism   Dementia without behavioral disturbance   Transaminitis    Adelene Idler PA-S  [facility] Pager [phone]. If 7PM-7AM, please contact night-coverage at [email or web address], password [facility] 05/19/2014, 10:50 AM  LOS: 1 day     Addendum  Patient seen and examined, chart and data base reviewed.  I agree with the above assessment and plan.  For full details please see Mrs. Adelene Idler PA-S note.  I reviewed and amended the above note as appropriate.  Birdie Hopes, MD [facility] Pager: [phone] 05/19/2014, 2:13 PM

## 2014-05-19 NOTE — Progress Notes (Signed)
Notified Dr. Hartford Poli of pts face being red and him c/o of it burning. New orders received. Will continue to monitor.

## 2014-05-19 NOTE — Progress Notes (Signed)
Nutrition Brief Note  Patient identified on the Braden Score Report (12).   Wt Readings from Last 15 Encounters:  05/18/14 180 lb (81.647 kg)  09/27/12 180 lb 14.4 oz (82.056 kg)  03/14/12 178 lb 6.4 oz (80.922 kg)  12/28/11 172 lb (78.019 kg)  12/16/11 184 lb (83.462 kg)   Paul Callahan is a 80 y.o. male with past mental history of parkinsonism, multiple spinal surgeries, dementia and history of colon cancer status post resection. Patient brought to the hospital by his family for weakness. Patient is sleepy easy to arouse but has short attention span, most of the history was obtained from the wife at bedside. She reported he was weak yesterday, he was unable to get off of his chair to the point she called EMS to help to get him up.  Pt with hx of Parkinson's. Noted stable weight over the past several years. Appetite good PTA. Pt has no chewing or swallowing difficulties.   Body mass index is 28.19 kg/(m^2). Patient meets criteria for overweight based on current BMI.   Current diet order is regular, patient is consuming approximately n/a% of meals at this time. Labs and medications reviewed.   No nutrition interventions warranted at this time. If nutrition issues arise, please consult RD.   Paul Callahan A. Jimmye Norman, RD, LDN Pager: [phone] After hours Pager: [phone]

## 2014-05-19 NOTE — Progress Notes (Signed)
Dr. Hartford Poli notified of pts wife still being concerned with pts face. Vancomycin stopped! Will continue to monitor.

## 2014-05-19 NOTE — Progress Notes (Signed)
On assessment this shift and as notified by charge RN, pt was very flushed and face possibly a little puffier. There was some question as to whether this may have been a reaction to vancomycin. Pt had a temp of 102.1 and was very hot to the touch. Pt was not itching and did not have any other complaints. Tylenol was given and fever improved. Pt did not appear to be as red. Another dose of vancomycin is due at this time. Out of caution, Dr. Rogue Bussing was called to advise. He ordered to give the dose, ordered benadryl, and continue to monitor the patient closely.

## 2014-05-20 ENCOUNTER — Inpatient Hospital Stay (HOSPITAL_COMMUNITY): Payer: Medicare Other

## 2014-05-20 ENCOUNTER — Encounter (HOSPITAL_COMMUNITY): Payer: Self-pay | Admitting: General Practice

## 2014-05-20 DIAGNOSIS — A419 Sepsis, unspecified organism: Secondary | ICD-10-CM

## 2014-05-20 HISTORY — DX: Sepsis, unspecified organism: A41.9

## 2014-05-20 LAB — COMPREHENSIVE METABOLIC PANEL
ALBUMIN: 3.2 g/dL — AB (ref 3.5–5.2)
ALT: 17 U/L (ref 0–53)
ANION GAP: 15 (ref 5–15)
AST: 35 U/L (ref 0–37)
Alkaline Phosphatase: 156 U/L — ABNORMAL HIGH (ref 39–117)
BILIRUBIN TOTAL: 0.9 mg/dL (ref 0.3–1.2)
BUN: 17 mg/dL (ref 6–23)
CHLORIDE: 101 meq/L (ref 96–112)
CO2: 23 mEq/L (ref 19–32)
Calcium: 8.7 mg/dL (ref 8.4–10.5)
Creatinine, Ser: 0.93 mg/dL (ref 0.50–1.35)
GFR calc Af Amer: 84 mL/min — ABNORMAL LOW (ref >90)
GFR calc non Af Amer: 73 mL/min — ABNORMAL LOW (ref >90)
Glucose, Bld: 145 mg/dL — ABNORMAL HIGH (ref 70–99)
Potassium: 3.4 mEq/L — ABNORMAL LOW (ref 3.7–5.3)
Sodium: 139 mEq/L (ref 137–147)
TOTAL PROTEIN: 6.6 g/dL (ref 6.0–8.3)

## 2014-05-20 LAB — CBC
HCT: 36 % — ABNORMAL LOW (ref 39.0–52.0)
Hemoglobin: 12.1 g/dL — ABNORMAL LOW (ref 13.0–17.0)
MCH: 30.9 pg (ref 26.0–34.0)
MCHC: 33.6 g/dL (ref 30.0–36.0)
MCV: 91.8 fL (ref 78.0–100.0)
Platelets: 126 10*3/uL — ABNORMAL LOW (ref 150–400)
RBC: 3.92 MIL/uL — ABNORMAL LOW (ref 4.22–5.81)
RDW: 14 % (ref 11.5–15.5)
WBC: 7.2 10*3/uL (ref 4.0–10.5)

## 2014-05-20 LAB — URINE CULTURE
COLONY COUNT: NO GROWTH
CULTURE: NO GROWTH

## 2014-05-20 MED ORDER — IOHEXOL 300 MG/ML  SOLN
25.0000 mL | INTRAMUSCULAR | Status: AC
Start: 2014-05-20 — End: 2014-05-20
  Administered 2014-05-20 (×2): 25 mL via ORAL

## 2014-05-20 MED ORDER — IOHEXOL 300 MG/ML  SOLN
80.0000 mL | Freq: Once | INTRAMUSCULAR | Status: AC | PRN
  Administered 2014-05-20: 80 mL via INTRAVENOUS

## 2014-05-20 MED ORDER — RIVASTIGMINE 9.5 MG/24HR TD PT24
9.5000 mg | MEDICATED_PATCH | Freq: Every day | TRANSDERMAL | Status: DC
  Administered 2014-05-20 – 2014-05-23 (×4): 9.5 mg via TRANSDERMAL
  Filled 2014-05-20 (×4): qty 1

## 2014-05-20 MED ORDER — POTASSIUM CHLORIDE 2 MEQ/ML IV SOLN
INTRAVENOUS | Status: DC
  Administered 2014-05-20 – 2014-05-24 (×5): via INTRAVENOUS
  Filled 2014-05-20 (×9): qty 1000

## 2014-05-20 NOTE — Plan of Care (Signed)
Problem: Phase I Progression Outcomes Goal: IV fluids as ordered Outcome: Progressing

## 2014-05-20 NOTE — Plan of Care (Signed)
Problem: Phase II Progression Outcomes Goal: Review labs and cultures Outcome: Progressing

## 2014-05-20 NOTE — Progress Notes (Signed)
PROGRESS NOTE  Paul Callahan JKK:[id number] DOB: 1923/08/13 DOA: 05/18/2014 PCP: [name], MD  HPI/Subjective: 80 yo male with past medical history of parkinsonism, demenitia, multiple spinal surgeries, and hx of colon cancer s/p resection.  Brought to the ED by family for weakness and not acting himself.  Wife reported that he was very weak over the past two days and noted he had an elevated temperature yesterday when she decided to bring him to the hospital.  Pt denies any cough, shortness of breath, or dysuria.  Blood work in the ED showed mild leukocytosis with 13.1, CXR appears to have RLL infiltrates     Pt improved.  Much more responsive and alert today.  Reports feeling much better.  Notes fever like symptoms- chills, and sweats.        Assessment/Plan:  Sepsis Present on admission.  Likely from RLL pneumonia- possibly from aspiration.   Temperature of 102.1, WBC of 13.1, and lactic acid of 3.34 on admission.  Last temp 100.  WBC trending down 13.1>>7.2. CXR shows RLL infiltrates.  UA shows trace leuks but no other findings.   Blood culture shows growth of Gram Negative Rods.  Urine culture NGTD. Continue IV Zosyn.  Discontinue IV Vancomycin.  Continue to monitor CBC.    Bacteremia    Possibly due to RLL pneumonia if it occurred from aspiration.   Continue IV Zosyn.  Discontinue IV Vancomycin.  Continue to monitor CBC.   Differential for sources of bacteremia includes aspiration pneumonia and intra-abdominal infection, check CT abdomen/pelvis (likely well show the base of the lungs as well). Please note that urinalysis and urine culture were negative.   Transaminitis Resolved.  AST 35 and ALT 17.  Abdominal exam is unremarkable.  No distention, tenderness, or guarding.  RUQ abd u/s shows cholelithiasis with no signs of acute cholecystitis.  Right lobe of liver shows no abnormalities, left lobe not visualized.          Dementia Chronic problem.  Prior problems with  sundowning in the hospital especially with narcotics.   Start Exelon patch.  Parkinsonism Chronic problem.  Stable.  Wife reports no hx of dysphagia or cough while eating. Continue on Carbidopa/Levodopa immediate and controlled release tablets.       DVT Prophylaxis:  Heparin 5,000 units q8h  Code Status: Full Family Communication: Daughter and son at bedside.   Disposition Plan: Will discharge home when medically appropriate.    Consultants:  None  Procedures:  RUQ abd u/s  Antibiotics:  IV Zosyn  Objective: Filed Vitals:   05/19/14 0654 05/19/14 1446 05/19/14 2305 05/20/14 0649  BP:  153/72 170/77 170/77  Pulse:   77   Temp: 98.7 F (37.1 C) 99.8 F (37.7 C) 99.6 F (37.6 C) 100 F (37.8 C)  TempSrc: Oral Oral Oral Oral  Resp:  20 20 24   Height:      Weight:      SpO2:  96% 99% 98%    Intake/Output Summary (Last 24 hours) at 05/20/14 1307 Last data filed at 05/20/14 1000  Gross per 24 hour  Intake   4210 ml  Output    600 ml  Net   3610 ml   Filed Weights   05/18/14 1509  Weight: 81.647 kg (180 lb)    Exam: General: Alert, NAD, appears stated age  56:  Anicteic Sclera, MMM.  Cardiovascular: RRR, S1 S2 auscultated, no rubs, murmurs or gallops.   Respiratory: Clear to auscultation bilaterally with equal chest rise  Abdomen: Soft, nontender, nondistended, + bowel sounds  Extremities: warm dry without cyanosis clubbing or edema.  Neuro: AAOx3, cranial nerves grossly intact. Strength 5/5 in upper extremities  Skin: Without rashes exudates or nodules.     Data Reviewed: Basic Metabolic Panel:  Recent Labs Lab 05/18/14 1534 05/19/14 0445 05/19/14 0826 05/20/14 0548  NA 140 138 139 139  K 5.2 6.4* 4.1 3.4*  CL 98 103 102 101  CO2 26 24 23 23   GLUCOSE 109* 128* 122* 145*  BUN 28* 23 20 17   CREATININE 1.23 1.10 1.01 0.93  CALCIUM 10.4 8.7 8.8 8.7   Liver Function Tests:  Recent Labs Lab 05/18/14 1534 05/20/14 0548  AST 72* 35    ALT 109* 17  ALKPHOS 180* 156*  BILITOT 1.4* 0.9  PROT 8.1 6.6  ALBUMIN 4.0 3.2*   CBC:  Recent Labs Lab 05/18/14 1534 05/19/14 0445 05/20/14 0548  WBC 13.1* 9.2 7.2  NEUTROABS 11.5*  --   --   HGB 14.6 12.4* 12.1*  HCT 44.7 38.0* 36.0*  MCV 96.8 96.9 91.8  PLT 159 135* 126*   CBG:  Recent Labs Lab 05/18/14 1501  GLUCAP 102*    Recent Results (from the past 240 hour(s))  Urine culture     Status: None   Collection Time: 05/18/14  3:28 PM  Result Value Ref Range Status   Specimen Description URINE, CATHETERIZED  Final   Special Requests NONE  Final   Culture  Setup Time   Final    05/19/2014 01:10 Performed at [place] Performed at [facility]   Final   Culture NO GROWTH Performed at [facility]   Final   Report Status 05/20/2014 FINAL  Final  Blood Culture (routine x 2)     Status: None (Preliminary result)   Collection Time: 05/18/14  3:30 PM  Result Value Ref Range Status   Specimen Description BLOOD RIGHT ARM  Final   Special Requests BOTTLES DRAWN AEROBIC AND ANAEROBIC 5 CC EACH  Final   Culture  Setup Time   Final    05/18/2014 23:28 Performed at [facility]    Culture   Final           BLOOD CULTURE RECEIVED NO GROWTH TO DATE CULTURE WILL BE HELD FOR 5 DAYS BEFORE ISSUING A FINAL NEGATIVE REPORT Performed at [facility]    Report Status PENDING  Incomplete  Blood Culture (routine x 2)     Status: None (Preliminary result)   Collection Time: 05/18/14  3:34 PM  Result Value Ref Range Status   Specimen Description BLOOD LEFT HAND  Final   Special Requests BOTTLES DRAWN AEROBIC AND ANAEROBIC 5 CC  Final   Culture  Setup Time   Final    05/18/2014 23:28 Performed at [facility]    Culture   Final    GRAM NEGATIVE RODS Note: Gram Stain Report Called to,Read Back By and Verified With: SONYA COLUMBRES AT 2:09 A.M. ON 05/20/2014 WARB Performed at [facility]    Report Status PENDING  Incomplete     Studies: Ct Head Wo Contrast  05/18/2014   CLINICAL DATA:  Altered mental status.  EXAM: CT HEAD WITHOUT CONTRAST  TECHNIQUE: Contiguous axial images were obtained from the base of the skull through the vertex without intravenous contrast.  COMPARISON:  09/27/2012  FINDINGS: Mild cerebral atrophy is unchanged pain within normal limits for  age. Periventricular white-matter hypodensities are nonspecific but compatible with minimal chronic small vessel ischemic disease, within normal limits for age. There is no evidence of acute cortical infarct, intracranial hemorrhage, mass, midline shift, or extra-axial fluid collection.  Mastoid air cells are clear. Polypoid mucosal thickening/ mucous retention cysts are noted in the right greater than left maxillary sinuses. Prior bilateral cataract extraction is noted.  IMPRESSION: No evidence of acute intracranial abnormality.   Electronically Signed   By: Logan Bores   On: 05/18/2014 16:50   Dg Chest Port 1 View 05/18/2014 IMPRESSION: Shallow inspiration with elevation of the right hemidiaphragm and bibasilar atelectasis.     US Abdomen Limited Ruq 05/19/2014  FINDINGS: Examination was limited due to patient's inability to move or take in a deep breath.  Gallbladder:  Multiple stones were present measuring up to 1.7 cm in size, including a 1.3 cm stone at the gallbladder neck. Gallbladder wall was not thickened, measuring 2.9 mm. Sonographic Murphy sign was negative.  Common bile duct:  Diameter: 3.5 mm  Liver:  Suboptimal evaluation with nonvisualization of the left lobe. No gross abnormalities identified in the visualized right lobe.  IMPRESSION: 1. Cholelithiasis without other evidence of acute cholecystitis. No biliary dilatation. 2. Limited evaluation of the liver as above.      Scheduled Meds: . carbidopa-levodopa  3 tablet Oral TID  . Carbidopa-Levodopa ER  1 tablet Oral QHS  . heparin  5,000 Units  Subcutaneous 3 times per day  . piperacillin-tazobactam (ZOSYN)  IV  3.375 g Intravenous 3 times per day  . sodium chloride  3 mL Intravenous Q12H   Continuous Infusions: . sodium chloride 100 mL/hr at 05/20/14 0004    Principal Problem:   Sepsis Active Problems:   Physical deconditioning   Parkinsonism   Dementia without behavioral disturbance   Transaminitis    Adelene Idler PA-S  [facility] Pager [phone]. If 7PM-7AM, please contact night-coverage at [email or web address], password [facility] 05/20/2014, 1:07 PM  LOS: 2 days     Addendum  Patient seen and examined, chart and data base reviewed.  I agree with the above assessment and plan.  For full details please see Mrs. Adelene Idler PA-S note.  I reviewed and amended the above note as appropriate.   Birdie Hopes, MD [facility] Pager: [phone] 05/20/2014, 2:17 PM

## 2014-05-20 NOTE — Care Management Note (Unsigned)
    Page 1 of 2   05/23/2014     3:50:05 PM CARE MANAGEMENT NOTE 05/23/2014  Patient:  Paul Callahan, Paul Callahan   Account Number:  [id number]  Date Initiated:  05/19/2014  Documentation initiated by:  [facility]  Subjective/Objective Assessment:   AMS     Action/Plan:   pt eval- rec CIR   Anticipated DC Date:  05/23/2014   Anticipated DC Plan:  IP REHAB FACILITY      DC Planning Services  CM consult      Choice offered to / List presented to:  C-3 Spouse        HH arranged  HH-1 RN  [place]      [place] agency  [facility]   Status of service:  In process, will continue to follow Medicare Important Message given?  YES (If response is "NO", the following Medicare IM given date fields will be blank) Date Medicare IM given:  05/20/2014 Medicare IM given by:  Marylyn Ishihara Date Additional Medicare IM given:  05/23/2014 Additional Medicare IM given by:  Sharolyn Douglas  Discharge Disposition:    Per UR Regulation:  Reviewed for med. necessity/level of care/duration of stay  If discussed at [place] of Stay Meetings, dates discussed:    Comments:  05/23/14 kristin mcnally rn pt for anticipated dc home with [place] on 05/24/14. met with pt & spouse and all choices of HHC for [employer] given. choice obtained for [facility] as they have used in the past. Spoke with intake [facility] and discussed need for RN, PT/OT and HHA services. they can accept case. SOC is sunday for PT/OT & HHA and RN to start [place] or Wed. Wife notified. Faxed referral to [facility] as requested. Will need dc summary when completed. [facility] contact number placed in epic.    05/19/2014 1135 Utilization review complete. Jonnie Finner RN CCM Case Mgmt

## 2014-05-21 ENCOUNTER — Inpatient Hospital Stay (HOSPITAL_COMMUNITY): Payer: Medicare Other

## 2014-05-21 DIAGNOSIS — R4182 Altered mental status, unspecified: Secondary | ICD-10-CM | POA: Insufficient documentation

## 2014-05-21 DIAGNOSIS — K8689 Other specified diseases of pancreas: Secondary | ICD-10-CM | POA: Diagnosis present

## 2014-05-21 DIAGNOSIS — R404 Transient alteration of awareness: Secondary | ICD-10-CM

## 2014-05-21 DIAGNOSIS — R7881 Bacteremia: Secondary | ICD-10-CM | POA: Diagnosis present

## 2014-05-21 LAB — CBC
HEMATOCRIT: 38.1 % — AB (ref 39.0–52.0)
Hemoglobin: 12.7 g/dL — ABNORMAL LOW (ref 13.0–17.0)
MCH: 30.8 pg (ref 26.0–34.0)
MCHC: 33.3 g/dL (ref 30.0–36.0)
MCV: 92.3 fL (ref 78.0–100.0)
Platelets: 129 10*3/uL — ABNORMAL LOW (ref 150–400)
RBC: 4.13 MIL/uL — ABNORMAL LOW (ref 4.22–5.81)
RDW: 13.9 % (ref 11.5–15.5)
WBC: 6.9 10*3/uL (ref 4.0–10.5)

## 2014-05-21 LAB — COMPREHENSIVE METABOLIC PANEL
ALBUMIN: 3.1 g/dL — AB (ref 3.5–5.2)
ALT: 16 U/L (ref 0–53)
AST: 29 U/L (ref 0–37)
Alkaline Phosphatase: 191 U/L — ABNORMAL HIGH (ref 39–117)
Anion gap: 14 (ref 5–15)
BILIRUBIN TOTAL: 1.1 mg/dL (ref 0.3–1.2)
BUN: 12 mg/dL (ref 6–23)
CHLORIDE: 102 meq/L (ref 96–112)
CO2: 23 mEq/L (ref 19–32)
Calcium: 8.8 mg/dL (ref 8.4–10.5)
Creatinine, Ser: 0.81 mg/dL (ref 0.50–1.35)
GFR calc Af Amer: 89 mL/min — ABNORMAL LOW (ref >90)
GFR calc non Af Amer: 77 mL/min — ABNORMAL LOW (ref >90)
Glucose, Bld: 154 mg/dL — ABNORMAL HIGH (ref 70–99)
Potassium: 3.7 mEq/L (ref 3.7–5.3)
Sodium: 139 mEq/L (ref 137–147)
Total Protein: 7 g/dL (ref 6.0–8.3)

## 2014-05-21 MED ORDER — STERILE WATER FOR INJECTION IJ SOLN
INTRAMUSCULAR | Status: AC
  Filled 2014-05-21: qty 10

## 2014-05-21 MED ORDER — HYDRALAZINE HCL 20 MG/ML IJ SOLN
5.0000 mg | INTRAMUSCULAR | Status: DC | PRN
  Administered 2014-05-21 – 2014-05-24 (×6): 5 mg via INTRAVENOUS
  Filled 2014-05-21 (×6): qty 1

## 2014-05-21 MED ORDER — SINCALIDE 5 MCG IJ SOLR
0.0200 ug/kg | Freq: Once | INTRAMUSCULAR | Status: AC
  Administered 2014-05-21: 1.6 ug via INTRAVENOUS
  Filled 2014-05-21: qty 5

## 2014-05-21 MED ORDER — TECHNETIUM TC 99M MEBROFENIN IV KIT
5.0000 | PACK | Freq: Once | INTRAVENOUS | Status: AC | PRN
  Administered 2014-05-21: 5 via INTRAVENOUS

## 2014-05-21 MED ORDER — BISACODYL 10 MG RE SUPP
10.0000 mg | Freq: Once | RECTAL | Status: AC
  Administered 2014-05-21: 10 mg via RECTAL
  Filled 2014-05-21: qty 1

## 2014-05-21 MED ORDER — SINCALIDE 5 MCG IJ SOLR
INTRAMUSCULAR | Status: AC
  Filled 2014-05-21: qty 5

## 2014-05-21 MED ORDER — MORPHINE SULFATE 2 MG/ML IJ SOLN
2.0000 mg | INTRAMUSCULAR | Status: DC | PRN
  Administered 2014-05-21 – 2014-05-24 (×2): 2 mg via INTRAVENOUS
  Filled 2014-05-21 (×2): qty 1

## 2014-05-21 NOTE — Progress Notes (Addendum)
I have directly reviewed the clinical findings, lab, imaging studies and management of this patient in detail. I have interviewed and examined the patient and agree with the documentation,  as recorded by the Physician extender.  Thurnell Lose M.D on 05/21/2014 at 1:44 PM  [facility] Office  [phone]     PROGRESS NOTE  Paul Callahan YTK:[id number] DOB: 06-Aug-1973 DOA: 05/18/2014 PCP: [name], MD  HPI/Subjective: Patient is a 80 year old white male with past medical history of parkinsonism, demenitia, multiple spinal surgeries, and hx of colon cancer s/p resection.  Brought to the ED by family for weakness and altered mental status.  Wife reported that he was very weak and had an elevated temperature 05/17/2014.  Patient denied any cough, shortness of breath, or dysuria.  Blood work in the ED showed mild leukocytosis with 13.1, CXR appeared to have RLL infiltrates.   11/18 Patient reports pain all over. He points to the area around his scar on his abdomen as the main source of pain, He also notes a mild headache, mild back pain, and mild chest pain. The patient is able to answer questions but closes his eyes when not being addressed.  Patient's wife states that the husband has been able to tolerate his secretions.   Assessment/Plan:  Sepsis Present on admission.  Likely from RLL pneumonia- possibly from aspiration but source still unclear.   Temperature of 102.1, WBC of 13.1, and lactic acid of 3.34 on admission.  Last temp 99.5.  WBC trending down 7.2>>6.9. CXR shows RLL infiltrates.  UA shows trace leuks but no other findings.   Blood culture shows growth of Gram Negative Rods.  Urine culture NGTD. Discussed with GI who recommends HIDA scan to rule out gallbladder involvement due to multiple gallstones seen on [place].   Continue IV Zosyn.  Discontinue IV Vancomycin.  Continue to monitor CBC.  NPO for HIDA scan  Will ask speech to evaluate for  aspiration.  Bacteremia    1 of 2 blood cultures positive for gram negative rods.  Sensitivities still pending. Possibly due to RLL pneumonia- Patient had been having possible episodes of aspiration on oral intake/secretions.   Will check HIDA scan to rule out gall bladder as a source. WBC 6.9 on 11/18. Afebrile. Lungs-Coarse subpleural opacities posteriorly in the visualized lung bases as before seen on CT.  Urinalysis and urine culture were negative.  See above.  For antibiotics.      Elevated LFTs. Transaminases have normalized.  Alkphos is still elevated.  (191)  AST 29 and ALT 16.  No distention, mild generalized tenderness, no guarding.  RUQ abd u/s shows cholelithiasis with no signs of acute cholecystitis.  Right lobe of liver shows no abnormalities, left lobe not visualized.  Hida ordered.   Pancreatic Mass:  Seen on CT abdomen on 11/17- interval enlargement of 2.8 cm pancreatic mass (from 1.5 cm in 2013), suggesting neoplasm with no evidence of regional or distal metastatic disease. [name], [facility],  has been consulted for evaluation.  Question whether or not an MRI would be feasible as patient has a spinal cord stimulator.      Dementia Chronic problem.  Prior problems with sundowning in the hospital especially with narcotics.   Start Exelon patch. Blinds to be open during the day.   Parkinsonism Chronic problem.  Stable.  Wife reports no hx of dysphagia or cough while eating. Continue on Carbidopa/Levodopa immediate and controlled release tablets.   Goal to ambulate patient to  bedside chair today.      DVT Prophylaxis:  Heparin 5,000 units q8h Diet:  NPO for HIDA  Code Status: Full Family Communication: Wife at bedside  Disposition Plan: Will discharge home when medically appropriate.    Consultants:  [facility], [name]  Procedures:  HIDA scan   Antibiotics: Anti-infectives    Start     Dose/Rate Route Frequency Ordered Stop   05/19/14 0400   vancomycin (VANCOCIN) IVPB 750 mg/150 ml premix  Status:  Discontinued     750 mg150 mL/hr over 60 Minutes Intravenous Every 12 hours 05/18/14 1642 05/20/14 1010   05/18/14 2200  piperacillin-tazobactam (ZOSYN) IVPB 3.375 g     3.375 g12.5 mL/hr over 240 Minutes Intravenous 3 times per day 05/18/14 1642     05/18/14 1515  piperacillin-tazobactam (ZOSYN) IVPB 3.375 g     3.375 g100 mL/hr over 30 Minutes Intravenous  Once 05/18/14 1506 05/18/14 1623   05/18/14 1515  vancomycin (VANCOCIN) IVPB 1000 mg/200 mL premix     1,000 mg200 mL/hr over 60 Minutes Intravenous  Once 05/18/14 1506 05/18/14 1640      Objective: Filed Vitals:   05/20/14 2234 05/21/14 0606 05/21/14 0640 05/21/14 0908  BP: 176/84 200/78 184/82 161/69  Pulse: 78  74   Temp: 99.7 F (37.6 C) 99.5 F (37.5 C)    TempSrc: Oral Oral    Resp: 16 16 20    Height:      Weight:      SpO2: 96% 100% 96%     Intake/Output Summary (Last 24 hours) at 05/21/14 1251 Last data filed at 05/21/14 3614  Gross per 24 hour  Intake 1382.5 ml  Output   2925 ml  Net -1542.5 ml   Filed Weights   05/18/14 1509  Weight: 81.647 kg (180 lb)    Exam: General: Alert when being spoken to, NAD, appears stated age  HEENT:  Anicteic Sclera, MMM, normocephalic Cardiovascular: RRR, S1 S2 auscultated, no rubs, murmurs or gallops.   Respiratory: Clear to auscultation bilaterally with equal chest rise  Abdomen: Soft, mild generalized tenderness, nondistended, + bowel sounds  Extremities: warm dry without cyanosis clubbing or edema. Parkinson's tremor.     Data Reviewed: Basic Metabolic Panel:  Recent Labs Lab 05/18/14 1534 05/19/14 0445 05/19/14 0826 05/20/14 0548 05/21/14 0634  NA 140 138 139 139 139  K 5.2 6.4* 4.1 3.4* 3.7  CL 98 103 102 101 102  CO2 26 24 23 23 23   GLUCOSE 109* 128* 122* 145* 154*  BUN 28* 23 20 17 12   CREATININE 1.23 1.10 1.01 0.93 0.81  CALCIUM 10.4 8.7 8.8 8.7 8.8   Liver Function Tests:  Recent  Labs Lab 05/18/14 1534 05/20/14 0548 05/21/14 0634  AST 72* 35 29  ALT 109* 17 16  ALKPHOS 180* 156* 191*  BILITOT 1.4* 0.9 1.1  PROT 8.1 6.6 7.0  ALBUMIN 4.0 3.2* 3.1*   CBC:  Recent Labs Lab 05/18/14 1534 05/19/14 0445 05/20/14 0548 05/21/14 0634  WBC 13.1* 9.2 7.2 6.9  NEUTROABS 11.5*  --   --   --   HGB 14.6 12.4* 12.1* 12.7*  HCT 44.7 38.0* 36.0* 38.1*  MCV 96.8 96.9 91.8 92.3  PLT 159 135* 126* 129*   CBG:  Recent Labs Lab 05/18/14 1501  GLUCAP 102*    Recent Results (from the past 240 hour(s))  Urine culture     Status: None   Collection Time: 05/18/14  3:28 PM  Result Value Ref Range  Status   Specimen Description URINE, CATHETERIZED  Final   Special Requests NONE  Final   Culture  Setup Time   Final    05/19/2014 01:10 Performed at [place] Performed at [facility]   Final   Culture NO GROWTH Performed at [facility]   Final   Report Status 05/20/2014 FINAL  Final  Blood Culture (routine x 2)     Status: None (Preliminary result)   Collection Time: 05/18/14  3:30 PM  Result Value Ref Range Status   Specimen Description BLOOD RIGHT ARM  Final   Special Requests BOTTLES DRAWN AEROBIC AND ANAEROBIC 5 CC EACH  Final   Culture  Setup Time   Final    05/18/2014 23:28 Performed at [facility]    Culture   Final           BLOOD CULTURE RECEIVED NO GROWTH TO DATE CULTURE WILL BE HELD FOR 5 DAYS BEFORE ISSUING A FINAL NEGATIVE REPORT Performed at [facility]    Report Status PENDING  Incomplete  Blood Culture (routine x 2)     Status: None (Preliminary result)   Collection Time: 05/18/14  3:34 PM  Result Value Ref Range Status   Specimen Description BLOOD LEFT HAND  Final   Special Requests BOTTLES DRAWN AEROBIC AND ANAEROBIC 5 CC  Final   Culture  Setup Time   Final    05/18/2014 23:28 Performed at [facility]    Culture   Final    GRAM NEGATIVE RODS Note:  Gram Stain Report Called to,Read Back By and Verified With: SONYA COLUMBRES AT 2:09 A.M. ON 05/20/2014 WARB Performed at [facility]    Report Status PENDING  Incomplete     Studies: Ct Head Wo Contrast  05/18/2014   CLINICAL DATA:  Altered mental status.  EXAM: CT HEAD WITHOUT CONTRAST  TECHNIQUE: Contiguous axial images were obtained from the base of the skull through the vertex without intravenous contrast.  COMPARISON:  09/27/2012  FINDINGS: Mild cerebral atrophy is unchanged pain within normal limits for age. Periventricular white-matter hypodensities are nonspecific but compatible with minimal chronic small vessel ischemic disease, within normal limits for age. There is no evidence of acute cortical infarct, intracranial hemorrhage, mass, midline shift, or extra-axial fluid collection.  Mastoid air cells are clear. Polypoid mucosal thickening/ mucous retention cysts are noted in the right greater than left maxillary sinuses. Prior bilateral cataract extraction is noted.  IMPRESSION: No evidence of acute intracranial abnormality.   Electronically Signed   By: Logan Bores   On: 05/18/2014 16:50   Dg Chest Port 1 View 05/18/2014 IMPRESSION: Shallow inspiration with elevation of the right hemidiaphragm and bibasilar atelectasis.     US Abdomen Limited Ruq 05/19/2014  FINDINGS: Examination was limited due to patient's inability to move or take in a deep breath.  Gallbladder:  Multiple stones were present measuring up to 1.7 cm in size, including a 1.3 cm stone at the gallbladder neck. Gallbladder wall was not thickened, measuring 2.9 mm. Sonographic Murphy sign was negative.  Common bile duct:  Diameter: 3.5 mm  Liver:  Suboptimal evaluation with nonvisualization of the left lobe. No gross abnormalities identified in the visualized right lobe.  IMPRESSION: 1. Cholelithiasis without other evidence of acute cholecystitis. No biliary dilatation. 2. Limited evaluation of the liver as above.      CT Abdomen Pelvis W Contrast  05/20/2014 IMPRESSION: 1. Interval  enlargement of 2.8 cm pancreatic mass, suggesting neoplasm. This may be approachable for endoscopic ultrasound guided biopsy. 2. No evidence of regional or distal metastatic disease. 3. Fecal distention of the rectum without evidence of obstruction.  Scheduled Meds: . bisacodyl  10 mg Rectal Once  . carbidopa-levodopa  3 tablet Oral TID  . Carbidopa-Levodopa ER  1 tablet Oral QHS  . heparin  5,000 Units Subcutaneous 3 times per day  . piperacillin-tazobactam (ZOSYN)  IV  3.375 g Intravenous 3 times per day  . rivastigmine  9.5 mg Transdermal Daily  . sodium chloride  3 mL Intravenous Q12H   Continuous Infusions: . dextrose 5 % and 0.45% NaCl 1,000 mL with potassium chloride 20 mEq infusion 75 mL/hr at 05/21/14 6659    Principal Problem:   Sepsis Active Problems:   Physical deconditioning   Parkinsonism   Dementia without behavioral disturbance   Transaminitis   Altered mental status   Pancreatic mass   Bacteremia    Colbert Ewing PA-S Imogene Burn, PA-C  [facility] Pager [phone]. If 7PM-7AM, please contact night-coverage at [email or web address], password [facility] 05/21/2014, 12:51 PM  LOS: 3 days

## 2014-05-21 NOTE — Plan of Care (Signed)
Problem: Phase II Progression Outcomes Goal: Adequate urine output Outcome: Progressing

## 2014-05-21 NOTE — Plan of Care (Signed)
Problem: Phase I Progression Outcomes Goal: IV fluids as ordered Outcome: Progressing

## 2014-05-21 NOTE — Evaluation (Addendum)
Clinical/Bedside Swallow Evaluation Patient Details  Name: Paul Callahan MRN: [id number] Date of Birth: [birth year]  Today's Date: 05/21/2014 Time: 0922-0942 SLP Time Calculation (min) (ACUTE ONLY): 20 min  Past Medical History:  Past Medical History  Diagnosis Date  . Parkinson disease   . Dementia   . Depression   . Bowel obstruction 12/15/2011  . Neuromuscular disorder     hx of parkinsons  . Cancer     hx of colon cancer  . Arthritis   . Sepsis 05/20/2014   Past Surgical History:  Past Surgical History  Procedure Laterality Date  . Eye surgery    . Cataracts    . Hernia repair    . Back surgery      x 5  . Frozen shoulder    . Vasectomy    . Total shoulder arthroplasty    . Lumbar fusion    . Spinal cord stimulator implant    . Colon surgery     HPI:  80 y.o. male with PMH:  parkinsonism, multiple spinal surgeries, dementia and history of colon cancer status post resection admitted for weakness. Found to have sepsis and possible pna.  CXR 11/15 appears to have RLL infiltrates    Assessment / Plan / Recommendation Clinical Impression  No overt indications of aspiration exhibited although Parkinson's places him at an increased risk.  Wife present and states "he does not have a problem swallowing . The doctor told me he does not have enough symptoms to have pna."  Education provided re: increased risk with straws and Parkinsons.  He consumed pills with RN via applesauce and thin water without difficulty. Wife pleasant, however question if she is receptive to relationship of Parkinson's and dysphagia.  He has not had multiple admissions for pna, however if admitted with pna in future, may consider an MBS. Continue regular texture and thin liquids, pills with thin and straws small sips.  No further ST at this time.     Aspiration Risk  Moderate    Diet Recommendation Regular;Thin liquid   Liquid Administration via: Cup;Straw Medication Administration: Whole meds with  liquid Supervision: Patient able to self feed;Full supervision/cueing for compensatory strategies Compensations: Slow rate;Small sips/bites Postural Changes and/or Swallow Maneuvers: Seated upright 90 degrees    Other  Recommendations Oral Care Recommendations: Oral care BID   Follow Up Recommendations  None    Frequency and Duration        Pertinent Vitals/Pain Discomfort with position in bed.  Repositioned         Swallow Study       Oral/Motor/Sensory Function Overall Oral Motor/Sensory Function:  (decreased ROM and strength)   Ice Chips Ice chips: Not tested   Thin Liquid Thin Liquid: Within functional limits Presentation: Cup;Straw    Nectar Thick Nectar Thick Liquid: Not tested   Honey Thick Honey Thick Liquid: Not tested   Puree Puree: Within functional limits   Solid   GO    Solid: Within functional limits       Paul Callahan 05/21/2014,9:52 AM  Paul Callahan.Ed [employer] [phone]

## 2014-05-21 NOTE — Consult Note (Signed)
Reason for Consult:abnormal CAT scan bacteremia Referring Physician: Hospital team  Paul Callahan is an 80 y.o. male.  HPI: patient known to me from a colonoscopy in 2010 but has not been seen since although I take care of his wife as well and he has been eating fine at home until he got sick on Saturday night and just has some general aches and pains everywhere without any specific complaints and is blood cultures grew out Escherichia coli and his CT showed a very slowly enlarging pancreatic head mass and currently this afternoon he is doing much better and has no specific complaints and his case was discussed extensively with his wife as well as the hospital team and his office computer chart andhospital computer chart was reviewed  Past Medical History  Diagnosis Date  . Parkinson disease   . Dementia   . Depression   . Bowel obstruction 12/15/2011  . Neuromuscular disorder     hx of parkinsons  . Cancer     hx of colon cancer  . Arthritis   . Sepsis 05/20/2014    Past Surgical History  Procedure Laterality Date  . Eye surgery    . Cataracts    . Hernia repair    . Back surgery      x 5  . Frozen shoulder    . Vasectomy    . Total shoulder arthroplasty    . Lumbar fusion    . Spinal cord stimulator implant    . Colon surgery      History reviewed. No pertinent family history.  Social History:  reports that he has quit smoking. He has never used smokeless tobacco. He reports that he does not drink alcohol or use illicit drugs.  Allergies:  Allergies  Allergen Reactions  . Amantadines Other (See Comments)    Weakness in legs and hallucinations  . Fentanyl Other (See Comments)    Confusion and disorientation   . Gabapentin Other (See Comments)    Weakness in legs and hallucinations  . Oxycodone     Confusion and disorientation   . Phenergan [Promethazine Hcl]     Confusion and disorientation     Medications: I have reviewed the patient's current  medications.  Results for orders placed or performed during the hospital encounter of 05/18/14 (from the past 48 hour(s))  CBC     Status: Abnormal   Collection Time: 05/20/14  5:48 AM  Result Value Ref Range   WBC 7.2 4.0 - 10.5 K/uL   RBC 3.92 (L) 4.22 - 5.81 MIL/uL   Hemoglobin 12.1 (L) 13.0 - 17.0 g/dL   HCT 36.0 (L) 39.0 - 52.0 %   MCV 91.8 78.0 - 100.0 fL   MCH 30.9 26.0 - 34.0 pg   MCHC 33.6 30.0 - 36.0 g/dL   RDW 14.0 11.5 - 15.5 %   Platelets 126 (L) 150 - 400 K/uL  Comprehensive metabolic panel     Status: Abnormal   Collection Time: 05/20/14  5:48 AM  Result Value Ref Range   Sodium 139 137 - 147 mEq/L   Potassium 3.4 (L) 3.7 - 5.3 mEq/L    Comment: DELTA CHECK NOTED   Chloride 101 96 - 112 mEq/L   CO2 23 19 - 32 mEq/L   Glucose, Bld 145 (H) 70 - 99 mg/dL   BUN 17 6 - 23 mg/dL   Creatinine, Ser 0.93 0.50 - 1.35 mg/dL   Calcium 8.7 8.4 - 10.5 mg/dL   Total Protein  6.6 6.0 - 8.3 g/dL   Albumin 3.2 (L) 3.5 - 5.2 g/dL   AST 35 0 - 37 U/L   ALT 17 0 - 53 U/L   Alkaline Phosphatase 156 (H) 39 - 117 U/L   Total Bilirubin 0.9 0.3 - 1.2 mg/dL   GFR calc non Af Amer 73 (L) >90 mL/min   GFR calc Af Amer 84 (L) >90 mL/min    Comment: (NOTE) The eGFR has been calculated using the CKD EPI equation. This calculation has not been validated in all clinical situations. eGFR's persistently <90 mL/min signify possible Chronic Kidney Disease.    Anion gap 15 5 - 15  CBC     Status: Abnormal   Collection Time: 05/21/14  6:34 AM  Result Value Ref Range   WBC 6.9 4.0 - 10.5 K/uL   RBC 4.13 (L) 4.22 - 5.81 MIL/uL   Hemoglobin 12.7 (L) 13.0 - 17.0 g/dL   HCT 38.1 (L) 39.0 - 52.0 %   MCV 92.3 78.0 - 100.0 fL   MCH 30.8 26.0 - 34.0 pg   MCHC 33.3 30.0 - 36.0 g/dL   RDW 13.9 11.5 - 15.5 %   Platelets 129 (L) 150 - 400 K/uL  Comprehensive metabolic panel     Status: Abnormal   Collection Time: 05/21/14  6:34 AM  Result Value Ref Range   Sodium 139 137 - 147 mEq/L   Potassium  3.7 3.7 - 5.3 mEq/L   Chloride 102 96 - 112 mEq/L   CO2 23 19 - 32 mEq/L   Glucose, Bld 154 (H) 70 - 99 mg/dL   BUN 12 6 - 23 mg/dL   Creatinine, Ser 0.81 0.50 - 1.35 mg/dL   Calcium 8.8 8.4 - 10.5 mg/dL   Total Protein 7.0 6.0 - 8.3 g/dL   Albumin 3.1 (L) 3.5 - 5.2 g/dL   AST 29 0 - 37 U/L   ALT 16 0 - 53 U/L   Alkaline Phosphatase 191 (H) 39 - 117 U/L   Total Bilirubin 1.1 0.3 - 1.2 mg/dL   GFR calc non Af Amer 77 (L) >90 mL/min   GFR calc Af Amer 89 (L) >90 mL/min    Comment: (NOTE) The eGFR has been calculated using the CKD EPI equation. This calculation has not been validated in all clinical situations. eGFR's persistently <90 mL/min signify possible Chronic Kidney Disease.    Anion gap 14 5 - 15    Ct Abdomen Pelvis W Contrast  05/20/2014   CLINICAL DATA:  Abdominal pain, septic, transaminitis  EXAM: CT ABDOMEN AND PELVIS WITH CONTRAST  TECHNIQUE: Multidetector CT imaging of the abdomen and pelvis was performed using the standard protocol following bolus administration of intravenous contrast.  CONTRAST:  51m OMNIPAQUE IOHEXOL 300 MG/ML  SOLN  COMPARISON:  12/16/2011  FINDINGS: Coarse subpleural opacities posteriorly in the visualized lung bases as before. Patchy coronary calcifications. Dorsal stimulator leads with implanted generator as before. Previous instrumented multilevel lumbar fusion surgery.  Unremarkable liver, physiologically distended gallbladder, spleen, adrenal glands, kidneys. Mild diffuse pancreatic parenchymal atrophy. There is a 2.8 cm low-attenuation mass extending anteriorly from the region of the junction of the pancreatic head and neck, previously 15 mm. This abuts the splenic vein confluence with the portal vein without any irregularity or narrowing of the vessels.  Stomach, small bowel, and colon are nondilated. This fecal distention of the rectum. Moderate prostatic enlargement with coarse calcifications. No ascites. No free air. No adenopathy localized.  Incomplete Distention of  the urinary bladder which is diffusely thick walled.  Advanced degenerative disc disease L5-S1 and Advanced degenerative changes in the right hip as before.  IMPRESSION: 1. Interval enlargement of 2.8 cm pancreatic mass, suggesting neoplasm. This may be approachable for endoscopic ultrasound guided biopsy. 2. No evidence of regional or distal metastatic disease. 3. Fecal distention of the rectum without  evidence of obstruction.   Electronically Signed   By: Arne Cleveland M.D.   On: 05/20/2014 21:59    ROSnegative except above Blood pressure 161/69, pulse 74, temperature 99.5 F (37.5 C), temperature source Oral, resp. rate 20, height 5' 7"  (1.702 m), weight 81.647 kg (180 lb), SpO2 96 %. Physical Examvital signs stable low-grade temp right now no acute distress currently abdomen is soft nontender good bowel sounds he does look much better than I thought based on his history  Assessment/Plan: Multiple medical problems including small slowly growing pancreatic mass and bacteremia Plan: Await CA 19 and unfortunately he cannot get in the MRI scanner because of his back stimulator but if he does well I be happy to see him back in the office and set him up with my partner [name] for an EUS even though one could argue he is not a surgical candidate and therefore do even need to biopsy this or should we just follow it but if cancerous possibly we could offer low risk radiation therapy and as far as his bacteremia goes await HIDA scan to see if acute cholecystitis can be implied and if it is positive keep nothing by mouth and consult surgery but if negative okay to have soft solids and I will check on tomorrow  [facility] 05/21/2014, 5:05 PM

## 2014-05-22 ENCOUNTER — Inpatient Hospital Stay (HOSPITAL_COMMUNITY): Payer: Medicare Other

## 2014-05-22 DIAGNOSIS — R7881 Bacteremia: Secondary | ICD-10-CM

## 2014-05-22 LAB — COMPREHENSIVE METABOLIC PANEL
ALT: 12 U/L (ref 0–53)
AST: 25 U/L (ref 0–37)
Albumin: 3.3 g/dL — ABNORMAL LOW (ref 3.5–5.2)
Alkaline Phosphatase: 201 U/L — ABNORMAL HIGH (ref 39–117)
Anion gap: 18 — ABNORMAL HIGH (ref 5–15)
BUN: 13 mg/dL (ref 6–23)
CALCIUM: 8.9 mg/dL (ref 8.4–10.5)
CO2: 22 meq/L (ref 19–32)
Chloride: 102 mEq/L (ref 96–112)
Creatinine, Ser: 0.84 mg/dL (ref 0.50–1.35)
GFR, EST AFRICAN AMERICAN: 88 mL/min — AB (ref >90)
GFR, EST NON AFRICAN AMERICAN: 76 mL/min — AB (ref >90)
Glucose, Bld: 143 mg/dL — ABNORMAL HIGH (ref 70–99)
Potassium: 3.7 mEq/L (ref 3.7–5.3)
SODIUM: 142 meq/L (ref 137–147)
Total Bilirubin: 1.2 mg/dL (ref 0.3–1.2)
Total Protein: 7.2 g/dL (ref 6.0–8.3)

## 2014-05-22 LAB — CULTURE, BLOOD (ROUTINE X 2)

## 2014-05-22 LAB — CBC
HEMATOCRIT: 38 % — AB (ref 39.0–52.0)
HEMOGLOBIN: 12.8 g/dL — AB (ref 13.0–17.0)
MCH: 30.6 pg (ref 26.0–34.0)
MCHC: 33.7 g/dL (ref 30.0–36.0)
MCV: 90.9 fL (ref 78.0–100.0)
Platelets: 142 10*3/uL — ABNORMAL LOW (ref 150–400)
RBC: 4.18 MIL/uL — ABNORMAL LOW (ref 4.22–5.81)
RDW: 13.9 % (ref 11.5–15.5)
WBC: 6.5 10*3/uL (ref 4.0–10.5)

## 2014-05-22 MED ORDER — CEFTRIAXONE SODIUM IN DEXTROSE 20 MG/ML IV SOLN
1.0000 g | INTRAVENOUS | Status: DC
  Administered 2014-05-22: 1 g via INTRAVENOUS
  Filled 2014-05-22 (×2): qty 50

## 2014-05-22 NOTE — Evaluation (Signed)
Physical Therapy Evaluation Patient Details Name: Paul Callahan MRN: [id number] DOB: October 10, 1923 Today's Date: 05/22/2014   History of Present Illness  80 y.o. male with PMH:  parkinsonism, multiple spinal surgeries, dementia and history of colon cancer status post resection admitted for weakness. Found to have sepsis and possible pna.  Clinical Impression  Pt pleasant with flat affect. Pt generally relatively mobile at home with assist of wife for function. For pt to return home he needs to be at a supervision level with mobility which could be achieve with short term rehab at [facility]. Will follow acutely to maximize mobility, strength, function, transfers and balance to decrease burden of care.     Follow Up Recommendations CIR;Supervision/Assistance - 24 hour    Equipment Recommendations  None recommended by PT    Recommendations for Other Services Rehab consult;OT consult     Precautions / Restrictions Precautions Precautions: Fall Restrictions Weight Bearing Restrictions: No      Mobility  Bed Mobility Overal bed mobility: Needs Assistance Bed Mobility: Rolling;Sidelying to Sit Rolling: Mod assist Sidelying to sit: Mod assist       General bed mobility comments: cues for sequence with assist to reach for rail and rotate pelvis as well as elevate trunk  Transfers Overall transfer level: Needs assistance   Transfers: Sit to/from Stand Sit to Stand: Min assist         General transfer comment: cues for hand placement and sequence to stand from bed with chair pulled to him with gait  Ambulation/Gait Ambulation/Gait assistance: Min assist Ambulation Distance (Feet): 6 Feet Assistive device: Rolling walker (2 wheeled) Gait Pattern/deviations: Shuffle;Trunk flexed;Narrow base of support   Gait velocity interpretation: Below normal speed for age/gender General Gait Details: cues for posture and to increase stride. Distance limited by fatigue, dizziness and  nausea  Stairs            Wheelchair Mobility    Modified Rankin (Stroke Patients Only)       Balance Overall balance assessment: Needs assistance Sitting-balance support: Bilateral upper extremity supported;Feet supported Sitting balance-Leahy Scale: Fair       Standing balance-Leahy Scale: Poor                               Pertinent Vitals/Pain Pain Assessment: No/denies pain    Home Living Family/patient expects to be discharged to:: Private residence Living Arrangements: Spouse/significant other Available Help at Discharge: Available 24 hours/day Type of Home: House Home Access: Ramped entrance     Home Layout: Two level;Able to live on main level with bedroom/bathroom Home Equipment: Gilford Rile - 2 wheels;Wheelchair - [employer];Shower seat - built in      Prior Function Level of Independence: Needs assistance   Gait / Transfers Assistance Needed: pt normally able to stand and walk with RW  ADL's / Homemaking Assistance Needed: pt total assist for dressing, mod assist for bathing, self feeds  Comments: Pt has a personal trainer has done CIR and OPPT neuro before. Wife 24hrs and aide 2-3 days/wk     Hand Dominance   Dominant Hand: Right    Extremity/Trunk Assessment   Upper Extremity Assessment: Generalized weakness           Lower Extremity Assessment: Generalized weakness      Cervical / Trunk Assessment: Kyphotic  Communication   Communication: Expressive difficulties  Cognition Arousal/Alertness: Awake/alert Behavior During Therapy: Flat affect Overall Cognitive Status: Impaired/Different from baseline Area  of Impairment: Orientation;Following commands     Memory: Decreased short-term memory Following Commands: Follows one step commands with increased time            General Comments      Exercises        Assessment/Plan    PT Assessment Patient needs continued PT services  PT Diagnosis  Abnormality of gait;Generalized weakness   PT Problem List Decreased strength;Decreased cognition;Decreased activity tolerance;Decreased safety awareness;Decreased balance;Decreased mobility;Decreased coordination  PT Treatment Interventions Gait training;Functional mobility training;Therapeutic activities;Therapeutic exercise;Balance training;Patient/family education;Neuromuscular re-education;DME instruction   PT Goals (Current goals can be found in the Care Plan section) Acute Rehab PT Goals Patient Stated Goal: walk and be able to go home PT Goal Formulation: With patient/family Time For Goal Achievement: 06/05/14 Potential to Achieve Goals: Fair    Frequency Min 3X/week   Barriers to discharge Decreased caregiver support      Co-evaluation               End of Session Equipment Utilized During Treatment: Gait belt Activity Tolerance: Patient limited by fatigue Patient left: in chair;with call bell/phone within reach;with chair alarm set;with family/visitor present;with nursing/sitter in room Nurse Communication: Mobility status;Precautions         Time: 1051-1110 PT Time Calculation (min) (ACUTE ONLY): 19 min   Charges:   PT Evaluation $Initial PT Evaluation Tier I: 1 Procedure PT Treatments $Therapeutic Activity: 8-22 mins   PT G CodesMelford Aase 05/22/2014, 11:56 AM  Elwyn Reach, [place]

## 2014-05-22 NOTE — Progress Notes (Signed)
Paul Callahan 2:58 PM  Subjective: Patient continues to improve and denies abdominal pain and has no new complaints and is tolerating soft diet and he and his wife requested regular food  Objective: Vital signs stable afebrile no acute distress abdomen is soft nontender labs stable HIDA scan okay  Assessment: Improved with sepsis of questionable cause and slowly increasing pancreatic lesion Plan: Will advance diet per wife and I will see back in the office in a few weeks to rediscuss the pancreatic lesion and decide if we should proceed with a EUS or follow-up with serial CAT scans or not work it up based on age slow-growing nature and him probably not being a surgical candidate for this [facility] E

## 2014-05-22 NOTE — Progress Notes (Signed)
ANTIBIOTIC CONSULT NOTE   Pharmacy Consult for Zosyn Indication: sepsis  Allergies  Allergen Reactions  . Amantadines Other (See Comments)    Weakness in legs and hallucinations  . Fentanyl Other (See Comments)    Confusion and disorientation   . Gabapentin Other (See Comments)    Weakness in legs and hallucinations  . Oxycodone     Confusion and disorientation   . Phenergan [Promethazine Hcl]     Confusion and disorientation     Patient Measurements: Height: 5\' 7"  (170.2 cm) Weight: 180 lb (81.647 kg) IBW/kg (Calculated) : 66.1  Vital Signs: Temp: 100.1 F (37.8 C) (11/19 0500) Temp Source: Oral (11/19 0500) BP: 161/69 mmHg (11/19 0500) Pulse Rate: 75 (11/19 0500) Intake/Output from previous day: 11/18 0701 - 11/19 0700 In: 1877.5 [I.V.:1727.5; IV Piggyback:150] Out: 1600 [Urine:1600] Intake/Output from this shift: Total I/O In: 120 [P.O.:120] Out: -   Labs:  Recent Labs  05/20/14 0548 05/21/14 0634 05/22/14 0525  WBC 7.2 6.9 6.5  HGB 12.1* 12.7* 12.8*  PLT 126* 129* 142*  CREATININE 0.93 0.81 0.84   Estimated Creatinine Clearance: 62.2 mL/min (by C-G formula based on Cr of 0.84). No results for input(s): VANCOTROUGH, VANCOPEAK, VANCORANDOM, GENTTROUGH, GENTPEAK, GENTRANDOM, TOBRATROUGH, TOBRAPEAK, TOBRARND, AMIKACINPEAK, AMIKACINTROU, AMIKACIN in the last 72 hours.   Microbiology: Recent Results (from the past 720 hour(s))  Urine culture     Status: None   Collection Time: 05/18/14  3:28 PM  Result Value Ref Range Status   Specimen Description URINE, CATHETERIZED  Final   Special Requests NONE  Final   Culture  Setup Time   Final    05/19/2014 01:10 Performed at [place] Performed at [facility]   Final   Culture NO GROWTH Performed at [facility]   Final   Report Status 05/20/2014 FINAL  Final  Blood Culture (routine x 2)     Status: None (Preliminary result)   Collection Time: 05/18/14   3:30 PM  Result Value Ref Range Status   Specimen Description BLOOD RIGHT ARM  Final   Special Requests BOTTLES DRAWN AEROBIC AND ANAEROBIC 5 CC EACH  Final   Culture  Setup Time   Final    05/18/2014 23:28 Performed at [facility]    Culture   Final           BLOOD CULTURE RECEIVED NO GROWTH TO DATE CULTURE WILL BE HELD FOR 5 DAYS BEFORE ISSUING A FINAL NEGATIVE REPORT Performed at [facility]    Report Status PENDING  Incomplete  Blood Culture (routine x 2)     Status: None   Collection Time: 05/18/14  3:34 PM  Result Value Ref Range Status   Specimen Description BLOOD LEFT HAND  Final   Special Requests BOTTLES DRAWN AEROBIC AND ANAEROBIC 5 CC  Final   Culture  Setup Time   Final    05/18/2014 23:28 Performed at [facility]    Culture   Final    ENTEROBACTER AMNIGENUS Note: Gram Stain Report Called to,Read Back By and Verified With: SONYA COLUMBRES AT 2:09 A.M. ON 05/20/2014 WARB Performed at [facility]    Report Status 05/22/2014 FINAL  Final   Organism ID, Bacteria ENTEROBACTER AMNIGENUS  Final      Susceptibility   Enterobacter amnigenus - MIC*    CEFAZOLIN RESISTANT      CEFEPIME <=1 SENSITIVE Sensitive     CEFTAZIDIME <=1 SENSITIVE  Sensitive     CEFTRIAXONE <=1 SENSITIVE Sensitive     CIPROFLOXACIN <=0.25 SENSITIVE Sensitive     GENTAMICIN <=1 SENSITIVE Sensitive     IMIPENEM <=0.25 SENSITIVE Sensitive     PIP/TAZO <=4 SENSITIVE Sensitive     TOBRAMYCIN <=1 SENSITIVE Sensitive     TRIMETH/SULFA <=20 SENSITIVE Sensitive     * ENTEROBACTER AMNIGENUS    Medical History: Past Medical History  Diagnosis Date  . Parkinson disease   . Dementia   . Depression   . Bowel obstruction 12/15/2011  . Neuromuscular disorder     hx of parkinsons  . Cancer     hx of colon cancer  . Arthritis   . Sepsis 05/20/2014    Medications:  See electronic med rec  Assessment: 80 y.o. male presents with AMS, fever - code sepsis called. To  continue Zosyn.  BC positive for enterobacter amnigenus.  Goal of Therapy:  Eradication of infection  Plan:  Consider narrowing antibiotic therapy. Thanks for allowing pharmacy to be a part of this patient's care.  Excell Seltzer, PharmD Clinical Pharmacist, [phone]  05/22/2014,12:47 PM

## 2014-05-22 NOTE — Progress Notes (Addendum)
Thank you for consult on Paul Callahan. Records reviewed. Recommend PT/OT evaluation to determine best rehab venue. Will follow at a distance for now.

## 2014-05-22 NOTE — Progress Notes (Signed)
PROGRESS NOTE  Paul Callahan:[id number] DOB: June 03, 1924 DOA: 05/18/2014 PCP: [name], MD  HPI/Subjective: Patient is a 80 year old white male with past medical history of parkinsonism, demenitia, multiple spinal surgeries, and hx of colon cancer s/p resection.  Brought to the ED by family for weakness and altered mental status.  Wife reported that he was very weak and had an elevated temperature 05/17/2014.  Patient denied any cough, shortness of breath, or dysuria.  Blood work in the ED showed mild leukocytosis with 13.1, CXR appeared to have RLL infiltrates.   05/22/2014:  Patient appears more comfortable.  Denies pain.  Sleepy.  States he is hungry.  Assessment/Plan:  Sepsis Present on admission.  Likely from RLL pneumonia- possibly from aspiration but source still unclear.   Temperature of 102.1, WBC of 13.1, and lactic acid of 3.34 on admission.  WBC trending down 7.2>>6.5. CXR shows RLL infiltrates.  UA shows trace leuks but no other findings.   Blood culture shows growth of enterobacter amnigenus.  Urine culture NGTD. HIDA scan is negative for source of infection.  CCK ejection fraction is normal.   Was initially treated with Vanc and Zosyn.  Now progressed to Rocephin.  Continue to monitor CBC.   Speech evaluated the patient and did not see signs of aspiration.  Regular texture with thin liquids are recommended & ordered.  Bacteremia    Uncertain etiology 1 of 2 blood cultures positive for enterobacter amnigenus.  Sensitive to Rocephin.  Will narrow ABs to Rocephin 11/19.  11/19 is day 5 of antibiotics.  Patient will need 14 days total of antibiotics. Possibly due to RLL pneumonia- Patient had been having possible episodes of aspiration on oral intake/secretions.   WBC 6.9 on 11/18. Afebrile. Lungs-Coarse subpleural opacities posteriorly in the visualized lung bases as before seen on CT.  Urinalysis and urine culture were negative.    Elevated LFTs. Transaminases  have normalized.  Alkphos is still elevated and slowly trending up.  (156>>191>>201)  No distention, mild generalized tenderness, no guarding.  RUQ abd u/s shows cholelithiasis with no signs of acute cholecystitis.  Right lobe of liver shows no abnormalities, left lobe not visualized.  Hida negative. Appreciate [facility] consultation.  Pancreatic Mass:  Seen on CT abdomen on 11/17- interval enlargement of 2.8 cm pancreatic mass (from 1.5 cm in 2013), suggesting neoplasm with no evidence of regional or distal metastatic disease. Thanks to [name] GI for his consultation.  The patient will likely be scheduled with [name] for an outpatient EUS after his acute bacteremia has resolved.  CA-19-9 pending. Question whether or not an MRI would be feasible as patient has a spinal cord stimulator.      Dementia Chronic problem.  Prior problems with sundowning in the hospital especially with narcotics.   Start Exelon patch.  Blinds to be open during the day.   Parkinsonism Chronic problem.  Stable.  Wife reports no hx of dysphagia or cough while eating. Continue on Carbidopa/Levodopa immediate and controlled release tablets.   Goal to ambulate patient to bedside chair today.      DVT Prophylaxis:  Heparin 5,000 units q8h Diet: Soft diet  Code Status: Full Family Communication: Wife at bedside  Disposition Plan: Home vs CIR.  PT / OT eval pending.   Consultants:  [facility], [name]  Procedures:  HIDA scan   Antibiotics: Anti-infectives    Start     Dose/Rate Route Frequency Ordered Stop   05/22/14 1230  cefTRIAXone (ROCEPHIN) 1 g in dextrose 5 % 50 mL IVPB - Premix     1 g100 mL/hr over 30 Minutes Intravenous Every 24 hours 05/22/14 1218     05/19/14 0400  vancomycin (VANCOCIN) IVPB 750 mg/150 ml premix  Status:  Discontinued     750 mg150 mL/hr over 60 Minutes Intravenous Every 12 hours 05/18/14 1642 05/20/14 1010   05/18/14 2200  piperacillin-tazobactam (ZOSYN) IVPB 3.375  g  Status:  Discontinued     3.375 g12.5 mL/hr over 240 Minutes Intravenous 3 times per day 05/18/14 1642 05/22/14 1218   05/18/14 1515  piperacillin-tazobactam (ZOSYN) IVPB 3.375 g     3.375 g100 mL/hr over 30 Minutes Intravenous  Once 05/18/14 1506 05/18/14 1623   05/18/14 1515  vancomycin (VANCOCIN) IVPB 1000 mg/200 mL premix     1,000 mg200 mL/hr over 60 Minutes Intravenous  Once 05/18/14 1506 05/18/14 1640      Objective: Filed Vitals:   05/21/14 1939 05/21/14 2031 05/21/14 2208 05/22/14 0500  BP: 184/74 182/75 168/73 161/69  Pulse:   79 75  Temp: 99.5 F (37.5 C)  99.4 F (37.4 C) 100.1 F (37.8 C)  TempSrc: Oral  Oral Oral  Resp: 20  18 20   Height:      Weight:      SpO2: 93%  94% 94%    Intake/Output Summary (Last 24 hours) at 05/22/14 1223 Last data filed at 05/22/14 0900  Gross per 24 hour  Intake 1997.5 ml  Output    550 ml  Net 1447.5 ml   Filed Weights   05/18/14 1509  Weight: 81.647 kg (180 lb)    Exam: General: Sleepy but appears comfortable, NAD, appears stated age  HEENT:  Anicteic Sclera, MMM, normocephalic Cardiovascular: RRR, S1 S2 auscultated, no rubs, murmurs or gallops.   Respiratory: Clear to auscultation bilaterally with equal chest rise  Abdomen: Soft, mild generalized tenderness, nondistended, + bowel sounds  Extremities: warm dry without cyanosis clubbing or edema. Parkinson's tremor.     Data Reviewed: Basic Metabolic Panel:  Recent Labs Lab 05/19/14 0445 05/19/14 0826 05/20/14 0548 05/21/14 0634 05/22/14 0525  NA 138 139 139 139 142  K 6.4* 4.1 3.4* 3.7 3.7  CL 103 102 101 102 102  CO2 24 23 23 23 22   GLUCOSE 128* 122* 145* 154* 143*  BUN 23 20 17 12 13   CREATININE 1.10 1.01 0.93 0.81 0.84  CALCIUM 8.7 8.8 8.7 8.8 8.9   Liver Function Tests:  Recent Labs Lab 05/18/14 1534 05/20/14 0548 05/21/14 0634 05/22/14 0525  AST 72* 35 29 25  ALT 109* 17 16 12   ALKPHOS 180* 156* 191* 201*  BILITOT 1.4* 0.9 1.1 1.2  PROT  8.1 6.6 7.0 7.2  ALBUMIN 4.0 3.2* 3.1* 3.3*   CBC:  Recent Labs Lab 05/18/14 1534 05/19/14 0445 05/20/14 0548 05/21/14 0634 05/22/14 0525  WBC 13.1* 9.2 7.2 6.9 6.5  NEUTROABS 11.5*  --   --   --   --   HGB 14.6 12.4* 12.1* 12.7* 12.8*  HCT 44.7 38.0* 36.0* 38.1* 38.0*  MCV 96.8 96.9 91.8 92.3 90.9  PLT 159 135* 126* 129* 142*   CBG:  Recent Labs Lab 05/18/14 1501  GLUCAP 102*    Recent Results (from the past 240 hour(s))  Urine culture     Status: None   Collection Time: 05/18/14  3:28 PM  Result Value Ref Range Status   Specimen Description URINE, CATHETERIZED  Final   Special Requests  NONE  Final   Culture  Setup Time   Final    05/19/2014 01:10 Performed at [place] Performed at [facility]   Final   Culture NO GROWTH Performed at [facility]   Final   Report Status 05/20/2014 FINAL  Final  Blood Culture (routine x 2)     Status: None (Preliminary result)   Collection Time: 05/18/14  3:30 PM  Result Value Ref Range Status   Specimen Description BLOOD RIGHT ARM  Final   Special Requests BOTTLES DRAWN AEROBIC AND ANAEROBIC 5 CC EACH  Final   Culture  Setup Time   Final    05/18/2014 23:28 Performed at [facility]    Culture   Final           BLOOD CULTURE RECEIVED NO GROWTH TO DATE CULTURE WILL BE HELD FOR 5 DAYS BEFORE ISSUING A FINAL NEGATIVE REPORT Performed at [facility]    Report Status PENDING  Incomplete  Blood Culture (routine x 2)     Status: None   Collection Time: 05/18/14  3:34 PM  Result Value Ref Range Status   Specimen Description BLOOD LEFT HAND  Final   Special Requests BOTTLES DRAWN AEROBIC AND ANAEROBIC 5 CC  Final   Culture  Setup Time   Final    05/18/2014 23:28 Performed at [facility]    Culture   Final    ENTEROBACTER AMNIGENUS Note: Gram Stain Report Called to,Read Back By and Verified With: SONYA COLUMBRES AT 2:09 A.M. ON 05/20/2014  WARB Performed at [facility]    Report Status 05/22/2014 FINAL  Final   Organism ID, Bacteria ENTEROBACTER AMNIGENUS  Final      Susceptibility   Enterobacter amnigenus - MIC*    CEFAZOLIN RESISTANT      CEFEPIME <=1 SENSITIVE Sensitive     CEFTAZIDIME <=1 SENSITIVE Sensitive     CEFTRIAXONE <=1 SENSITIVE Sensitive     CIPROFLOXACIN <=0.25 SENSITIVE Sensitive     GENTAMICIN <=1 SENSITIVE Sensitive     IMIPENEM <=0.25 SENSITIVE Sensitive     PIP/TAZO <=4 SENSITIVE Sensitive     TOBRAMYCIN <=1 SENSITIVE Sensitive     TRIMETH/SULFA <=20 SENSITIVE Sensitive     * ENTEROBACTER AMNIGENUS     Studies: Ct Head Wo Contrast  05/18/2014   CLINICAL DATA:  Altered mental status.  EXAM: CT HEAD WITHOUT CONTRAST  TECHNIQUE: Contiguous axial images were obtained from the base of the skull through the vertex without intravenous contrast.  COMPARISON:  09/27/2012  FINDINGS: Mild cerebral atrophy is unchanged pain within normal limits for age. Periventricular white-matter hypodensities are nonspecific but compatible with minimal chronic small vessel ischemic disease, within normal limits for age. There is no evidence of acute cortical infarct, intracranial hemorrhage, mass, midline shift, or extra-axial fluid collection.  Mastoid air cells are clear. Polypoid mucosal thickening/ mucous retention cysts are noted in the right greater than left maxillary sinuses. Prior bilateral cataract extraction is noted.  IMPRESSION: No evidence of acute intracranial abnormality.   Electronically Signed   By: Logan Bores   On: 05/18/2014 16:50   Dg Chest Port 1 View 05/18/2014 IMPRESSION: Shallow inspiration with elevation of the right hemidiaphragm and bibasilar atelectasis.     US Abdomen Limited Ruq 05/19/2014  FINDINGS: Examination was limited due to patient's inability to move or take in a deep breath.  Gallbladder:  Multiple stones were present measuring up  to 1.7 cm in size, including a 1.3 cm stone at  the gallbladder neck. Gallbladder wall was not thickened, measuring 2.9 mm. Sonographic Murphy sign was negative.  Common bile duct:  Diameter: 3.5 mm  Liver:  Suboptimal evaluation with nonvisualization of the left lobe. No gross abnormalities identified in the visualized right lobe.  IMPRESSION: 1. Cholelithiasis without other evidence of acute cholecystitis. No biliary dilatation. 2. Limited evaluation of the liver as above.     CT Abdomen Pelvis W Contrast  05/20/2014 IMPRESSION: 1. Interval enlargement of 2.8 cm pancreatic mass, suggesting neoplasm. This may be approachable for endoscopic ultrasound guided biopsy. 2. No evidence of regional or distal metastatic disease. 3. Fecal distention of the rectum without evidence of obstruction.  Scheduled Meds: . carbidopa-levodopa  3 tablet Oral TID  . Carbidopa-Levodopa ER  1 tablet Oral QHS  . cefTRIAXone (ROCEPHIN)  IV  1 g Intravenous Q24H  . heparin  5,000 Units Subcutaneous 3 times per day  . rivastigmine  9.5 mg Transdermal Daily  . sodium chloride  3 mL Intravenous Q12H   Continuous Infusions: . dextrose 5 % and 0.45% NaCl 1,000 mL with potassium chloride 20 mEq infusion 50 mL/hr at 05/22/14 2263    Principal Problem:   Sepsis Active Problems:   Physical deconditioning   Parkinsonism   Dementia without behavioral disturbance   Transaminitis   Altered mental status   Pancreatic mass   Bacteremia    Imogene Burn, PA-C  [facility] Pager [phone]. If 7PM-7AM, please contact night-coverage at [email or web address], password [facility] 05/22/2014, 12:23 PM  LOS: 4 days

## 2014-05-23 DIAGNOSIS — K869 Disease of pancreas, unspecified: Secondary | ICD-10-CM

## 2014-05-23 LAB — CBC
HCT: 37.8 % — ABNORMAL LOW (ref 39.0–52.0)
Hemoglobin: 12.6 g/dL — ABNORMAL LOW (ref 13.0–17.0)
MCH: 31.1 pg (ref 26.0–34.0)
MCHC: 33.3 g/dL (ref 30.0–36.0)
MCV: 93.3 fL (ref 78.0–100.0)
PLATELETS: 159 10*3/uL (ref 150–400)
RBC: 4.05 MIL/uL — ABNORMAL LOW (ref 4.22–5.81)
RDW: 14.2 % (ref 11.5–15.5)
WBC: 6.2 10*3/uL (ref 4.0–10.5)

## 2014-05-23 LAB — CANCER ANTIGEN 19-9: CA 19-9: 285.8 U/mL — ABNORMAL HIGH (ref <35.0)

## 2014-05-23 MED ORDER — CEFTRIAXONE SODIUM IN DEXTROSE 40 MG/ML IV SOLN
2.0000 g | INTRAVENOUS | Status: DC
  Administered 2014-05-23 – 2014-05-24 (×2): 2 g via INTRAVENOUS
  Filled 2014-05-23 (×2): qty 50

## 2014-05-23 MED ORDER — RIVASTIGMINE 4.6 MG/24HR TD PT24
13.9000 mg | MEDICATED_PATCH | Freq: Every day | TRANSDERMAL | Status: DC
Start: 2014-05-24 — End: 2014-05-24
  Administered 2014-05-24: 13.9 mg via TRANSDERMAL
  Filled 2014-05-23: qty 3

## 2014-05-23 MED ORDER — DULOXETINE HCL 60 MG PO CPEP
60.0000 mg | ORAL_CAPSULE | Freq: Two times a day (BID) | ORAL | Status: DC
  Administered 2014-05-23 – 2014-05-24 (×3): 60 mg via ORAL
  Filled 2014-05-23 (×4): qty 1

## 2014-05-23 NOTE — Plan of Care (Signed)
Problem: Phase I Progression Outcomes Goal: Pain controlled with appropriate interventions Outcome: Completed/Met Date Met:  05/23/14 Goal: OOB as tolerated unless otherwise ordered Outcome: Completed/Met Date Met:  05/23/14 Goal: Initial discharge plan identified Outcome: Completed/Met Date Met:  05/23/14 Goal: Voiding-avoid urinary catheter unless indicated Outcome: Completed/Met Date Met:  05/23/14 Goal: Hemodynamically stable Outcome: Completed/Met Date Met:  05/23/14

## 2014-05-23 NOTE — Progress Notes (Signed)
IM given today 05/23/14 by Sharolyn Douglas RN

## 2014-05-23 NOTE — Progress Notes (Signed)
Paul Callahan 4:43 PM  Subjective: Patient continues to improve from a GI standpoint but still is a weak and they are debating whether he is a candidate for rehabilitation  Objective: Vital signs stable afebrile no acute distress CBC okay increased CA 19 abdomen is soft nontender  Assessment: Improved sepsis of questionable etiology abnormal pancreatic CT  Plan: We rediscussed follow up with me in the next month to consider EUS and they will call me sooner when necessary and please call this weekend if I could be of any further assistance  [facility]

## 2014-05-23 NOTE — Progress Notes (Signed)
PROGRESS NOTE  Paul Callahan CHE:[id number] DOB: Jul 05, 1923 DOA: 05/18/2014 PCP: [name], MD  HPI/Subjective: Patient is a 80 year old white male with past medical history of parkinsonism, demenitia, multiple spinal surgeries, and hx of colon cancer s/p resection.  Brought to the ED by family for weakness and altered mental status.  Wife reported that he was very weak and had an elevated temperature 05/17/2014.  Patient denied any cough, shortness of breath, or dysuria.  Blood work in the ED showed mild leukocytosis with 13.1, CXR appeared to have RLL infiltrates.    Assessment/Plan:  Sepsis Present on admission.  Likely from RLL pneumonia- possibly from aspiration but source still unclear.   Temperature of 102.1, WBC of 13.1, and lactic acid of 3.34 on admission.  CXR shows RLL infiltrates.  UA shows trace leuks but no other findings.   Blood culture shows growth of enterobacter amnigenus.  Sensitive to multiple antibiotics (Including cipro and rocephin) Urine culture NGTD. HIDA scan is negative for source of infection.  CCK ejection fraction is normal.   Was initially treated with Vanc and Zosyn.  Now progressed to Rocephin.  Continue to monitor CBC.   Speech evaluated the patient and did not see signs of aspiration.  Regular texture with thin liquids are recommended & ordered.  Bacteremia    Uncertain etiology 1 of 2 blood cultures positive for enterobacter amnigenus.  Sensitive to Rocephin.  Will narrow ABs to Rocephin 11/19.  11/19 is day 5 of antibiotics.  Recommend discharging the patient on Ceftin as the family had concerns about cipro (and fluoroquinolones).   Possibly due to RLL pneumonia.     WBC normal.   Afebrile. Lungs-Coarse subpleural opacities posteriorly in the visualized lung bases as before seen on CT.  Urinalysis and urine culture were negative.    Elevated LFTs. Transaminases have normalized.  Alkphos is still elevated and slowly trending up.   (156>>191>>201)  No distention, mild generalized tenderness, no guarding.  RUQ abd u/s shows cholelithiasis with no signs of acute cholecystitis.  Right lobe of liver shows no abnormalities, left lobe not visualized.  Hida negative. Appreciate [facility] consultation. Recommend outpatient follow up for elevated alk phos.  Pancreatic Mass:  Seen on CT abdomen on 11/17- interval enlargement of 2.8 cm pancreatic mass (from 1.5 cm in 2013), suggesting neoplasm with no evidence of regional or distal metastatic disease. Thanks to [name] for his consultation.  The patient will follow with GI outpatient for potential biopsy vs. Watchful waiting based on how aggressive the family wants to be. CA 19 - 9 elevated at 285.8. Doubt an MRI would be feasible as patient has a spinal cord stimulator.      Dementia Chronic problem.  Prior problems with sundowning in the hospital especially with narcotics.   Restart Exelon patch and cymbalta.  Blinds to be open during the day.   Parkinsonism Chronic problem.  Stable.  Wife reports no hx of dysphagia or cough while eating. Continue on Carbidopa/Levodopa immediate and controlled release tablets.   Goal to ambulate patient to bedside chair today.      DVT Prophylaxis:  Heparin 5,000 units q8h Diet: Soft diet  Code Status: Full Family Communication: Wife and daughter at bedside.   Daughter is a [employer] over the [employer].  Disposition Plan: Home with full home health services on 11/21   Consultants:  [facility], [name]  Procedures:  HIDA scan   Antibiotics: Anti-infectives  Start     Dose/Rate Route Frequency Ordered Stop   05/23/14 1400  cefTRIAXone (ROCEPHIN) 2 g in dextrose 5 % 50 mL IVPB - Premix     2 g100 mL/hr over 30 Minutes Intravenous Every 24 hours 05/23/14 1105     05/22/14 1400  cefTRIAXone (ROCEPHIN) 1 g in dextrose 5 % 50 mL IVPB - Premix  Status:  Discontinued     1 g100 mL/hr over 30 Minutes Intravenous  Every 24 hours 05/22/14 1218 05/23/14 1105   05/19/14 0400  vancomycin (VANCOCIN) IVPB 750 mg/150 ml premix  Status:  Discontinued     750 mg150 mL/hr over 60 Minutes Intravenous Every 12 hours 05/18/14 1642 05/20/14 1010   05/18/14 2200  piperacillin-tazobactam (ZOSYN) IVPB 3.375 g  Status:  Discontinued     3.375 g12.5 mL/hr over 240 Minutes Intravenous 3 times per day 05/18/14 1642 05/22/14 1218   05/18/14 1515  piperacillin-tazobactam (ZOSYN) IVPB 3.375 g     3.375 g100 mL/hr over 30 Minutes Intravenous  Once 05/18/14 1506 05/18/14 1623   05/18/14 1515  vancomycin (VANCOCIN) IVPB 1000 mg/200 mL premix     1,000 mg200 mL/hr over 60 Minutes Intravenous  Once 05/18/14 1506 05/18/14 1640      Objective: Filed Vitals:   05/23/14 0529 05/23/14 0709 05/23/14 1329 05/23/14 1330  BP: 184/76 173/69  152/68  Pulse:    71  Temp: 97.8 F (36.6 C)  98.9 F (37.2 C)   TempSrc: Oral  Oral   Resp: 15   16  Height:      Weight:      SpO2: 93%   95%    Intake/Output Summary (Last 24 hours) at 05/23/14 1449 Last data filed at 05/23/14 0936  Gross per 24 hour  Intake    120 ml  Output    975 ml  Net   -855 ml   Filed Weights   05/18/14 1509 05/23/14 0521  Weight: 81.647 kg (180 lb) 82.373 kg (181 lb 9.6 oz)    Exam: General:  appears comfortable, unfortunately begins to cry.  Asks for his wife.  HEENT:  Anicteic Sclera, MMM, normocephalic Cardiovascular: RRR, S1 S2 auscultated, no rubs, murmurs or gallops.   Respiratory: Clear to auscultation bilaterally with equal chest rise  Abdomen: Soft, mild generalized tenderness, nondistended, + bowel sounds  Extremities: warm dry without cyanosis clubbing or edema. Parkinson's tremor.     Data Reviewed: Basic Metabolic Panel:  Recent Labs Lab 05/19/14 0445 05/19/14 0826 05/20/14 0548 05/21/14 0634 05/22/14 0525  NA 138 139 139 139 142  K 6.4* 4.1 3.4* 3.7 3.7  CL 103 102 101 102 102  CO2 24 23 23 23 22   GLUCOSE 128* 122* 145*  154* 143*  BUN 23 20 17 12 13   CREATININE 1.10 1.01 0.93 0.81 0.84  CALCIUM 8.7 8.8 8.7 8.8 8.9   Liver Function Tests:  Recent Labs Lab 05/18/14 1534 05/20/14 0548 05/21/14 0634 05/22/14 0525  AST 72* 35 29 25  ALT 109* 17 16 12   ALKPHOS 180* 156* 191* 201*  BILITOT 1.4* 0.9 1.1 1.2  PROT 8.1 6.6 7.0 7.2  ALBUMIN 4.0 3.2* 3.1* 3.3*   CBC:  Recent Labs Lab 05/18/14 1534 05/19/14 0445 05/20/14 0548 05/21/14 0634 05/22/14 0525 05/23/14 0532  WBC 13.1* 9.2 7.2 6.9 6.5 6.2  NEUTROABS 11.5*  --   --   --   --   --   HGB 14.6 12.4* 12.1* 12.7* 12.8* 12.6*  HCT  44.7 38.0* 36.0* 38.1* 38.0* 37.8*  MCV 96.8 96.9 91.8 92.3 90.9 93.3  PLT 159 135* 126* 129* 142* 159   CBG:  Recent Labs Lab 05/18/14 1501  GLUCAP 102*    Recent Results (from the past 240 hour(s))  Urine culture     Status: None   Collection Time: 05/18/14  3:28 PM  Result Value Ref Range Status   Specimen Description URINE, CATHETERIZED  Final   Special Requests NONE  Final   Culture  Setup Time   Final    05/19/2014 01:10 Performed at [place] Performed at [facility]   Final   Culture NO GROWTH Performed at [facility]   Final   Report Status 05/20/2014 FINAL  Final  Blood Culture (routine x 2)     Status: None (Preliminary result)   Collection Time: 05/18/14  3:30 PM  Result Value Ref Range Status   Specimen Description BLOOD RIGHT ARM  Final   Special Requests BOTTLES DRAWN AEROBIC AND ANAEROBIC 5 CC EACH  Final   Culture  Setup Time   Final    05/18/2014 23:28 Performed at [facility]    Culture   Final           BLOOD CULTURE RECEIVED NO GROWTH TO DATE CULTURE WILL BE HELD FOR 5 DAYS BEFORE ISSUING A FINAL NEGATIVE REPORT Performed at [facility]    Report Status PENDING  Incomplete  Blood Culture (routine x 2)     Status: None   Collection Time: 05/18/14  3:34 PM  Result Value Ref Range Status   Specimen  Description BLOOD LEFT HAND  Final   Special Requests BOTTLES DRAWN AEROBIC AND ANAEROBIC 5 CC  Final   Culture  Setup Time   Final    05/18/2014 23:28 Performed at [facility]    Culture   Final    ENTEROBACTER AMNIGENUS Note: Gram Stain Report Called to,Read Back By and Verified With: SONYA COLUMBRES AT 2:09 A.M. ON 05/20/2014 WARB Performed at [facility]    Report Status 05/22/2014 FINAL  Final   Organism ID, Bacteria ENTEROBACTER AMNIGENUS  Final      Susceptibility   Enterobacter amnigenus - MIC*    CEFAZOLIN RESISTANT      CEFEPIME <=1 SENSITIVE Sensitive     CEFTAZIDIME <=1 SENSITIVE Sensitive     CEFTRIAXONE <=1 SENSITIVE Sensitive     CIPROFLOXACIN <=0.25 SENSITIVE Sensitive     GENTAMICIN <=1 SENSITIVE Sensitive     IMIPENEM <=0.25 SENSITIVE Sensitive     PIP/TAZO <=4 SENSITIVE Sensitive     TOBRAMYCIN <=1 SENSITIVE Sensitive     TRIMETH/SULFA <=20 SENSITIVE Sensitive     * ENTEROBACTER AMNIGENUS     Studies: Ct Head Wo Contrast  05/18/2014   CLINICAL DATA:  Altered mental status.  EXAM: CT HEAD WITHOUT CONTRAST  TECHNIQUE: Contiguous axial images were obtained from the base of the skull through the vertex without intravenous contrast.  COMPARISON:  09/27/2012  FINDINGS: Mild cerebral atrophy is unchanged pain within normal limits for age. Periventricular white-matter hypodensities are nonspecific but compatible with minimal chronic small vessel ischemic disease, within normal limits for age. There is no evidence of acute cortical infarct, intracranial hemorrhage, mass, midline shift, or extra-axial fluid collection.  Mastoid air cells are clear. Polypoid mucosal thickening/ mucous retention cysts are noted in the right greater than left maxillary sinuses. Prior bilateral cataract extraction is  noted.  IMPRESSION: No evidence of acute intracranial abnormality.   Electronically Signed   By: Logan Bores   On: 05/18/2014 16:50   Dg Chest Port 1  View 05/18/2014 IMPRESSION: Shallow inspiration with elevation of the right hemidiaphragm and bibasilar atelectasis.     US Abdomen Limited Ruq 05/19/2014  FINDINGS: Examination was limited due to patient's inability to move or take in a deep breath.  Gallbladder:  Multiple stones were present measuring up to 1.7 cm in size, including a 1.3 cm stone at the gallbladder neck. Gallbladder wall was not thickened, measuring 2.9 mm. Sonographic Murphy sign was negative.  Common bile duct:  Diameter: 3.5 mm  Liver:  Suboptimal evaluation with nonvisualization of the left lobe. No gross abnormalities identified in the visualized right lobe.  IMPRESSION: 1. Cholelithiasis without other evidence of acute cholecystitis. No biliary dilatation. 2. Limited evaluation of the liver as above.     CT Abdomen Pelvis W Contrast  05/20/2014 IMPRESSION: 1. Interval enlargement of 2.8 cm pancreatic mass, suggesting neoplasm. This may be approachable for endoscopic ultrasound guided biopsy. 2. No evidence of regional or distal metastatic disease. 3. Fecal distention of the rectum without evidence of obstruction.  Scheduled Meds: . carbidopa-levodopa  3 tablet Oral TID  . Carbidopa-Levodopa ER  1 tablet Oral QHS  . cefTRIAXone (ROCEPHIN)  IV  2 g Intravenous Q24H  . DULoxetine  60 mg Oral BID  . heparin  5,000 Units Subcutaneous 3 times per day  . [START ON 05/24/2014] rivastigmine  13.9 mg Transdermal Daily  . sodium chloride  3 mL Intravenous Q12H   Continuous Infusions: . dextrose 5 % and 0.45% NaCl 1,000 mL with potassium chloride 20 mEq infusion 50 mL/hr at 05/23/14 1213    Principal Problem:   Sepsis Active Problems:   Physical deconditioning   Parkinsonism   Dementia without behavioral disturbance   Transaminitis   Altered mental status   Pancreatic mass   Bacteremia    Imogene Burn, PA-C  [facility] Pager [phone]. If 7PM-7AM, please contact night-coverage at [email or web address], password  [facility] 05/23/2014, 2:49 PM  LOS: 5 days

## 2014-05-23 NOTE — Progress Notes (Signed)
Physical Therapy Treatment Patient Details Name: Paul Callahan MRN: [id number] DOB: [birth year] Today's Date: 05/23/2014    History of Present Illness 80 y.o. male with PMH:  parkinsonism, multiple spinal surgeries, dementia and history of colon cancer status post resection admitted for weakness. Found to have sepsis and possible pna.    PT Comments    Patient progressing with ambulation distance, though limited due to bowel incontinence.  He was very fatigued after ambulation and requested to go to bed quickly closing eyes after reclined.  Has significant leg length discrepancy and postural deficits (?due to parkinsons or back surgery or both.)   Will benefit from inpatient rehab following d/c to allow return home with family assist.  Follow Up Recommendations  CIR;Supervision/Assistance - 24 hour     Equipment Recommendations  None recommended by PT    Recommendations for Other Services       Precautions / Restrictions Precautions Precautions: Fall    Mobility  Bed Mobility Overal bed mobility: Needs Assistance Bed Mobility: Sit to Supine       Sit to supine: Mod assist   General bed mobility comments: cues for positioning at edge of bed and assist to lift legs into bed.  Transfers Overall transfer level: Needs assistance     Sit to Stand: Mod assist         General transfer comment: increased time to transition to standing after up in recliner for awhile, demonstrated posterior bias; onto low toilet into bathroom needed lowering assist and cues to use grabbar  Ambulation/Gait Ambulation/Gait assistance: Mod assist Ambulation Distance (Feet): 12 Feet (x2; to/from bathroom) Assistive device: Rolling walker (2 wheeled) Gait Pattern/deviations: Step-through pattern;Trunk flexed;Decreased dorsiflexion - right;Decreased dorsiflexion - left;Decreased stride length;Decreased step length - right     General Gait Details: walks on toes on right leg due to leg length  discrepancy; needed assist to maneuver walker around all obstacles in the room and to get in/out of bathroom stepping sideways with walker; was incontinent of bowel on way to bathroom where he had requested to go to have bowel movement.   Stairs            Wheelchair Mobility    Modified Rankin (Stroke Patients Only)       Balance Overall balance assessment: Needs assistance         Standing balance support: Bilateral upper extremity supported Standing balance-Leahy Scale: Poor Standing balance comment: not much weight on walker with ambulation, but flexed posture and decreased ankle mobility as well as slowed reaction time has high risk for falls.                    Cognition Arousal/Alertness: Awake/alert Behavior During Therapy: Flat affect Overall Cognitive Status: Impaired/Different from baseline Area of Impairment: Orientation;Following commands     Memory: Decreased short-term memory Following Commands: Follows one step commands with increased time            Exercises      General Comments        Pertinent Vitals/Pain Pain Assessment: No/denies pain    Home Living                      Prior Function            PT Goals (current goals can now be found in the care plan section) Progress towards PT goals: Progressing toward goals    Frequency  Min 3X/week    PT Plan  Current plan remains appropriate    Co-evaluation             End of Session Equipment Utilized During Treatment: Gait belt Activity Tolerance: Patient limited by fatigue Patient left: in bed;with call bell/phone within reach;with bed alarm set;with family/visitor present     Time: 7619-5093 PT Time Calculation (min) (ACUTE ONLY): 25 min  Charges:  $Gait Training: 8-22 mins $Therapeutic Activity: 8-22 mins                    G Codes:      Jyll Tomaro,CYNDI 06-21-2014, 10:46 AM Magda Kiel, [place] 06/21/2014

## 2014-05-23 NOTE — Progress Notes (Addendum)
Received a call from [place], [place] with TH. Patient's family has decided on discharging to home with 24 hours hired caregivers as patient not motivated to go to CIR and this would adversely affecting him psychologically. Will cancel CIR consult.

## 2014-05-23 NOTE — Evaluation (Signed)
Occupational Therapy Evaluation Patient Details Name: Paul Callahan MRN: [id number] DOB: [birth year] Today's Date: 05/23/2014    History of Present Illness 80 y.o. male with PMH:  parkinsonism, multiple spinal surgeries, dementia and history of colon cancer status post resection admitted for weakness. Found to have sepsis and possible pna.   Clinical Impression   Pt is typically assisted by his wife and/or personal care attendant for bathing, dressing and toileting.  He can self feed and perform some grooming tasks at baseline.  Pt presents today with fatigue, limiting evaluation.  Pt wants to be able to perform ambulation and transfers at a supervision level so his wife can manage him at home.  Will follow acutely.   Follow Up Recommendations  CIR;Supervision/Assistance - 24 hour    Equipment Recommendations       Recommendations for Other Services       Precautions / Restrictions Precautions Precautions: Fall Restrictions Weight Bearing Restrictions: No      Mobility Bed Mobility Overal bed mobility: Needs Assistance Bed Mobility: Rolling;Sidelying to Sit;Sit to Sidelying Rolling: Mod assist Sidelying to sit: Mod assist   Sit to supine: Mod assist Sit to sidelying: Mod assist General bed mobility comments: assist for LEs and trunk  Transfers Overall transfer level: Needs assistance Equipment used: Rolling walker (2 wheeled) Transfers: Sit to/from Stand Sit to Stand: Mod assist         General transfer comment: increased time to transition to standing from EOB, posterior lean    Balance Overall balance assessment: Needs assistance   Sitting balance-Leahy Scale: Fair     Standing balance support: Bilateral upper extremity supported Standing balance-Leahy Scale: Poor                             ADL Overall ADL's : Needs assistance/impaired Eating/Feeding: Set up;Sitting   Grooming: Wash/dry hands;Wash/dry face;Supervision/safety;Sitting    Upper Body Bathing: Moderate assistance;Sitting   Lower Body Bathing: Total assistance;Sit to/from stand   Upper Body Dressing : Moderate assistance;Sitting   Lower Body Dressing: Total assistance;Sit to/from stand                       Vision                     Perception     Praxis      Pertinent Vitals/Pain Pain Assessment: No/denies pain     Hand Dominance Right   Extremity/Trunk Assessment Upper Extremity Assessment Upper Extremity Assessment: Generalized weakness   Lower Extremity Assessment Lower Extremity Assessment: Defer to PT evaluation, leg length discrepancy   Cervical / Trunk Assessment Cervical / Trunk Assessment: Kyphotic   Communication Communication Communication: Expressive difficulties   Cognition Arousal/Alertness: Lethargic Behavior During Therapy: Flat affect Overall Cognitive Status: Impaired/Different from baseline Area of Impairment: Orientation;Following commands Orientation Level: Time;Situation   Memory: Decreased short-term memory Following Commands: Follows one step commands with increased time           General Comments       Exercises       Shoulder Instructions      Home Living Family/patient expects to be discharged to:: Private residence Living Arrangements: Spouse/significant other Available Help at Discharge: Available 24 hours/day;Personal care attendant Type of Home: House Home Access: Ramped entrance     Home Layout: Two level;Able to live on main level with bedroom/bathroom  Home Equipment: [employer] Part - 2 wheels;Wheelchair - [employer];Shower seat - built in          Prior Functioning/Environment Level of Independence: Needs assistance  Gait / Transfers Assistance Needed: pt normally able to stand and walk with RW ADL's / Homemaking Assistance Needed: pt total assist for dressing, mod assist for bathing, self feeds        OT Diagnosis: Generalized  weakness;Cognitive deficits   OT Problem List: Decreased strength;Decreased activity tolerance;Impaired balance (sitting and/or standing);Decreased cognition;Decreased coordination   OT Treatment/Interventions: Self-care/ADL training;DME and/or AE instruction;Therapeutic activities;Patient/family education    OT Goals(Current goals can be found in the care plan section) Acute Rehab OT Goals Patient Stated Goal: walk and be able to go home OT Goal Formulation: With patient Time For Goal Achievement: 06/06/14 Potential to Achieve Goals: Good ADL Goals Pt Will Perform Grooming: with supervision;standing Pt Will Transfer to Toilet: with supervision;ambulating;bedside commode (over toilet) Pt Will Perform Toileting - Clothing Manipulation and hygiene: with min assist;sit to/from stand Additional ADL Goal #1: Pt will perform bed mobility with min guard assist as a precursor to ADL.  OT Frequency: Min 2X/week   Barriers to D/C:            Co-evaluation              End of Session Equipment Utilized During Treatment: Gait belt;Rolling walker  Activity Tolerance: Patient limited by fatigue Patient left: in bed;with call bell/phone within reach;with bed alarm set;with family/visitor present   Time: 8309-4076 OT Time Calculation (min): 16 min Charges:  OT General Charges $OT Visit: 1 Procedure OT Evaluation $Initial OT Evaluation Tier I: 1 Procedure OT Treatments $Therapeutic Activity: 8-22 mins G-Codes:    Malka So 05/23/2014, 11:39 AM  [phone]

## 2014-05-24 LAB — CBC
HCT: 39.8 % (ref 39.0–52.0)
HEMOGLOBIN: 13.5 g/dL (ref 13.0–17.0)
MCH: 31 pg (ref 26.0–34.0)
MCHC: 33.9 g/dL (ref 30.0–36.0)
MCV: 91.5 fL (ref 78.0–100.0)
Platelets: 186 10*3/uL (ref 150–400)
RBC: 4.35 MIL/uL (ref 4.22–5.81)
RDW: 14.2 % (ref 11.5–15.5)
WBC: 6.1 10*3/uL (ref 4.0–10.5)

## 2014-05-24 LAB — CULTURE, BLOOD (ROUTINE X 2): Culture: NO GROWTH

## 2014-05-24 LAB — COMPREHENSIVE METABOLIC PANEL
ALBUMIN: 3.2 g/dL — AB (ref 3.5–5.2)
ALK PHOS: 214 U/L — AB (ref 39–117)
ALT: 11 U/L (ref 0–53)
AST: 43 U/L — ABNORMAL HIGH (ref 0–37)
Anion gap: 17 — ABNORMAL HIGH (ref 5–15)
BUN: 12 mg/dL (ref 6–23)
CO2: 20 mEq/L (ref 19–32)
Calcium: 9.2 mg/dL (ref 8.4–10.5)
Chloride: 102 mEq/L (ref 96–112)
Creatinine, Ser: 0.73 mg/dL (ref 0.50–1.35)
GFR calc Af Amer: >90 mL/min (ref >90)
GFR calc non Af Amer: 80 mL/min — ABNORMAL LOW (ref >90)
Glucose, Bld: 128 mg/dL — ABNORMAL HIGH (ref 70–99)
POTASSIUM: 4.3 meq/L (ref 3.7–5.3)
SODIUM: 139 meq/L (ref 137–147)
TOTAL PROTEIN: 7.3 g/dL (ref 6.0–8.3)
Total Bilirubin: 0.8 mg/dL (ref 0.3–1.2)

## 2014-05-24 MED ORDER — RISAQUAD-2 PO CAPS: ORAL_CAPSULE | ORAL | Status: DC

## 2014-05-24 MED ORDER — CARVEDILOL 3.125 MG PO TABS: 3.1250 mg | ORAL_TABLET | Freq: Two times a day (BID) | ORAL | Status: DC

## 2014-05-24 MED ORDER — CIPROFLOXACIN HCL 500 MG PO TABS: 500.0000 mg | ORAL_TABLET | Freq: Two times a day (BID) | ORAL | Status: DC

## 2014-05-24 NOTE — Progress Notes (Signed)
Discharge education completed by RN. Pt and son received a copy of discharge paperwork and confirm understanding of follow up appointments and discharge medications. Both deny any questions at this time. IV removed, site is within normal limits. Pt will discharge from the unit via PTAR.

## 2014-05-24 NOTE — Discharge Instructions (Signed)
Follow with Primary MD [name], MD and your stomach Dr. in 7 days   Get CBC, CMP, 2 view Chest X ray checked  by Primary MD next visit.    Activity: As tolerated with Full fall precautions use walker/cane & assistance as needed   Disposition Home     Diet: Heart Healthy  with feeding assistance and aspiration precautions as needed.  For Heart failure patients - Check your Weight same time everyday, if you gain over 2 pounds, or you develop in leg swelling, experience more shortness of breath or chest pain, call your Primary MD immediately. Follow Cardiac Low Salt Diet and 1.8 lit/day fluid restriction.   On your next visit with your primary care physician please Get Medicines reviewed and adjusted.   Please request your Prim.MD to go over all Hospital Tests and Procedure/Radiological results at the follow up, please get all Hospital records sent to your Prim MD by signing hospital release before you go home.   If you experience worsening of your admission symptoms, develop shortness of breath, life threatening emergency, suicidal or homicidal thoughts you must seek medical attention immediately by calling 911 or calling your MD immediately  if symptoms less severe.  You Must read complete instructions/literature along with all the possible adverse reactions/side effects for all the Medicines you take and that have been prescribed to you. Take any new Medicines after you have completely understood and accpet all the possible adverse reactions/side effects.   Do not drive, operating heavy machinery, perform activities at heights, swimming or participation in water activities or provide baby sitting services if your were admitted for syncope or siezures until you have seen by Primary MD or a Neurologist and advised to do so again.  Do not drive when taking Pain medications.    Do not take more than prescribed Pain, Sleep and Anxiety Medications  Special Instructions: If you have  smoked or chewed Tobacco  in the last 2 yrs please stop smoking, stop any regular Alcohol  and or any Recreational drug use.  Wear Seat belts while driving.   Please note  You were cared for by a hospitalist during your hospital stay. If you have any questions about your discharge medications or the care you received while you were in the hospital after you are discharged, you can call the unit and asked to speak with the hospitalist on call if the hospitalist that took care of you is not available. Once you are discharged, your primary care physician will handle any further medical issues. Please note that NO REFILLS for any discharge medications will be authorized once you are discharged, as it is imperative that you return to your primary care physician (or establish a relationship with a primary care physician if you do not have one) for your aftercare needs so that they can reassess your need for medications and monitor your lab values.

## 2014-05-24 NOTE — Plan of Care (Signed)
Problem: Phase III Progression Outcomes Goal: Pain controlled on oral analgesia Outcome: Completed/Met Date Met:  05/24/14

## 2014-05-24 NOTE — Plan of Care (Signed)
Problem: Phase II Progression Outcomes Goal: Progress activity as tolerated unless otherwise ordered Outcome: Progressing     

## 2014-05-24 NOTE — Discharge Summary (Signed)
Paul Callahan, is a 80 y.o. male  DOB 1923-08-25  MRN [id number].  Admission date:  05/18/2014  Admitting Physician  Paul Monte, MD  Discharge Date:  05/24/2014   Primary MD  Paul Sheller, MD  Recommendations for primary care physician for things to follow:   Check CBC, CMP, CA 19.9 in a week. Must follow with GI closely.   Admission Diagnosis  Altered mental status [R41.82] Dementia, without behavioral disturbance [F03.90]   Discharge Diagnosis  Altered mental status [R41.82] Dementia, without behavioral disturbance [F03.90]    Principal Problem:   Sepsis Active Problems:   Physical deconditioning   Parkinsonism   Dementia without behavioral disturbance   Transaminitis   Altered mental status   Pancreatic mass   Bacteremia      Past Medical History  Diagnosis Date  . Parkinson disease   . Dementia   . Depression   . Bowel obstruction 12/15/2011  . Neuromuscular disorder     hx of parkinsons  . Cancer     hx of colon cancer  . Arthritis   . Sepsis 05/20/2014    Past Surgical History  Procedure Laterality Date  . Eye surgery    . Cataracts    . Hernia repair    . Back surgery      x 5  . Frozen shoulder    . Vasectomy    . Total shoulder arthroplasty    . Lumbar fusion    . Spinal cord stimulator implant    . Colon surgery         History of present illness and  Hospital Course:     Kindly see H&P for history of present illness and admission details, please review complete Labs, Consult reports and Test reports for all details in brief  HPI  Patient is a 80 year old white male with past medical history of parkinsonism, demenitia, multiple spinal surgeries, and hx of colon cancer s/p resection. Brought to the ED by family for weakness and altered mental status. Wife reported that he was  very weak and had an elevated temperature 05/17/2014. Patient denied any cough, shortness of breath, or dysuria. Blood work in the ED showed mild leukocytosis with 13.1, CXR appeared to have RLL infiltrates.     Hospital Course    1. Sepsis with negative enterococcal bacteremia. Source unclear, chest x-ray inconclusive, UA unremarkable, abdominal CT scan unremarkable, HIDA scan unremarkable, seen by GI. Case discussed with ID physician Paul Callahan. He was kept on empiric IV antibiotic which was Rocephin, cultures noted growing enterococcus, after discussions with ID plan is to put him on Cipro for 10 more days to complete total of 14 days of treatment. Will follow with PCP and GI closely in the outpatient setting.    2. Elevated LFTs and CA 19.9. With 2 year history of slowly going pancreatic mass, HIDA scan unremarkable, seen by GI, no further workup or intervention at this time will follow with GI in the outpatient setting.   3. History of  parkinsonism with dementia. At risk for delirium. Home regimen commenced. Seen by PT. Wife does not want placement. Home health to resume treatment post discharge.      Discharge Condition: stable   Follow UP  Follow-up Information    Follow up with [facility]   Why:  PT/OT, HHA will start Sunday 11/22, RN to start Tue or [employer] information:   [address] [place] [address] [phone]       Follow up with Paul Sheller, MD. Schedule an appointment as soon as possible for a visit in 1 week.   Specialty:  Internal Medicine   Contact information:   [address], [place] [address] [phone]       Follow up with [facility], MD. Schedule an appointment as soon as possible for a visit in 1 week.   Specialty:  Gastroenterology   Contact information:   [address], [place] [address] [phone]         Discharge Instructions  and  Discharge Medications       Discharge Instructions    Diet - low sodium heart healthy    Complete by:  As directed      Discharge instructions    Complete by:  As directed   Follow with Primary MD Paul Sheller, MD and your stomach Dr. in 7 days   Get CBC, CMP, 2 view Chest X ray checked  by Primary MD next visit.    Activity: As tolerated with Full fall precautions use walker/cane & assistance as needed   Disposition Home     Diet: Heart Healthy  with feeding assistance and aspiration precautions as needed.  For Heart failure patients - Check your Weight same time everyday, if you gain over 2 pounds, or you develop in leg swelling, experience more shortness of breath or chest pain, call your Primary MD immediately. Follow Cardiac Low Salt Diet and 1.8 lit/day fluid restriction.   On your next visit with your primary care physician please Get Medicines reviewed and adjusted.   Please request your Prim.MD to go over all Hospital Tests and Procedure/Radiological results at the follow up, please get all Hospital records sent to your Prim MD by signing hospital release before you go home.   If you experience worsening of your admission symptoms, develop shortness of breath, life threatening emergency, suicidal or homicidal thoughts you must seek medical attention immediately by calling 911 or calling your MD immediately  if symptoms less severe.  You Must read complete instructions/literature along with all the possible adverse reactions/side effects for all the Medicines you take and that have been prescribed to you. Take any new Medicines after you have completely understood and accpet all the possible adverse reactions/side effects.   Do not drive, operating heavy machinery, perform activities at heights, swimming or participation in water activities or provide baby sitting services if your were admitted for syncope or siezures until you have seen by Primary MD or a Neurologist and advised to do so  again.  Do not drive when taking Pain medications.    Do not take more than prescribed Pain, Sleep and Anxiety Medications  Special Instructions: If you have smoked or chewed Tobacco  in the last 2 yrs please stop smoking, stop any regular Alcohol  and or any Recreational drug use.  Wear Seat belts while driving.   Please note  You were cared for by a hospitalist during your hospital  stay. If you have any questions about your discharge medications or the care you received while you were in the hospital after you are discharged, you can call the unit and asked to speak with the hospitalist on call if the hospitalist that took care of you is not available. Once you are discharged, your primary care physician will handle any further medical issues. Please note that NO REFILLS for any discharge medications will be authorized once you are discharged, as it is imperative that you return to your primary care physician (or establish a relationship with a primary care physician if you do not have one) for your aftercare needs so that they can reassess your need for medications and monitor your lab values.     Increase activity slowly    Complete by:  As directed             Medication List    TAKE these medications        BIOTIN PO  Take 2 tablets by mouth every evening.     calcium carbonate 600 MG Tabs tablet  Commonly known as:  OS-CAL  Take 600 mg by mouth 2 (two) times daily with a meal.     carbidopa-levodopa 25-100 MG per tablet  Commonly known as:  SINEMET IR  Take 3 tablets by mouth 3 (three) times daily.     carbidopa-levodopa 25-100 MG per tablet  Commonly known as:  SINEMET CR  Take 1 tablet by mouth at bedtime.     carvedilol 3.125 MG tablet  Commonly known as:  COREG  Take 1 tablet (3.125 mg total) by mouth 2 (two) times daily with a meal.     celecoxib 200 MG capsule  Commonly known as:  CELEBREX  Take 200 mg by mouth daily with supper.     cholecalciferol 1000  UNITS tablet  Commonly known as:  VITAMIN D  Take 1,000 Units by mouth daily.     ciprofloxacin 500 MG tablet  Commonly known as:  CIPRO  Take 1 tablet (500 mg total) by mouth 2 (two) times daily.     DULoxetine 60 MG capsule  Commonly known as:  CYMBALTA  Take 60 mg by mouth 2 (two) times daily.     ICAPS AREDS FORMULA PO  Take 1 capsule by mouth 2 (two) times daily.     multivitamin with minerals Tabs tablet  Take 1 tablet by mouth daily.     pregabalin 150 MG capsule  Commonly known as:  LYRICA  Take 150 mg by mouth 2 (two) times daily.     RISAQUAD-2 Caps  1 PO twice a day     zinc gluconate 50 MG tablet  Take 100 mg by mouth daily.          Diet and Activity recommendation: See Discharge Instructions above   Consults obtained - GI, ID Paul Callahan over the phone   Major procedures and Radiology Reports - PLEASE review detailed and final reports for all details, in brief -       Dg Chest 2 View  05/22/2014   CLINICAL DATA:  Fever.  Evaluate for pneumonia.  EXAM: CHEST  2 VIEW  COMPARISON:  05/22/2014  FINDINGS: The heart size appears normal. There is asymmetric elevation of the no pleural effusion or interstitial edema identified. There is no airspace consolidation.  IMPRESSION: 1. No evidence for pneumonia.   Electronically Signed   By: Kerby Moors M.D.   On: 05/22/2014 13:57  Ct Head Wo Contrast  05/18/2014   CLINICAL DATA:  Altered mental status.  EXAM: CT HEAD WITHOUT CONTRAST  TECHNIQUE: Contiguous axial images were obtained from the base of the skull through the vertex without intravenous contrast.  COMPARISON:  09/27/2012  FINDINGS: Mild cerebral atrophy is unchanged pain within normal limits for age. Periventricular white-matter hypodensities are nonspecific but compatible with minimal chronic small vessel ischemic disease, within normal limits for age. There is no evidence of acute cortical infarct, intracranial hemorrhage, mass, midline shift, or  extra-axial fluid collection.  Mastoid air cells are clear. Polypoid mucosal thickening/ mucous retention cysts are noted in the right greater than left maxillary sinuses. Prior bilateral cataract extraction is noted.  IMPRESSION: No evidence of acute intracranial abnormality.   Electronically Signed   By: Logan Bores   On: 05/18/2014 16:50   Ct Abdomen Pelvis W Contrast  05/20/2014   CLINICAL DATA:  Abdominal pain, septic, transaminitis  EXAM: CT ABDOMEN AND PELVIS WITH CONTRAST  TECHNIQUE: Multidetector CT imaging of the abdomen and pelvis was performed using the standard protocol following bolus administration of intravenous contrast.  CONTRAST:  78mL OMNIPAQUE IOHEXOL 300 MG/ML  SOLN  COMPARISON:  12/16/2011  FINDINGS: Coarse subpleural opacities posteriorly in the visualized lung bases as before. Patchy coronary calcifications. Dorsal stimulator leads with implanted generator as before. Previous instrumented multilevel lumbar fusion surgery.  Unremarkable liver, physiologically distended gallbladder, spleen, adrenal glands, kidneys. Mild diffuse pancreatic parenchymal atrophy. There is a 2.8 cm low-attenuation mass extending anteriorly from the region of the junction of the pancreatic head and neck, previously 15 mm. This abuts the splenic vein confluence with the portal vein without any irregularity or narrowing of the vessels.  Stomach, small bowel, and colon are nondilated. This fecal distention of the rectum. Moderate prostatic enlargement with coarse calcifications. No ascites. No free air. No adenopathy localized. Incomplete Distention of the urinary bladder which is diffusely thick walled.  Advanced degenerative disc disease L5-S1 and Advanced degenerative changes in the right hip as before.  IMPRESSION: 1. Interval enlargement of 2.8 cm pancreatic mass, suggesting neoplasm. This may be approachable for endoscopic ultrasound guided biopsy. 2. No evidence of regional or distal metastatic disease. 3.  Fecal distention of the rectum without  evidence of obstruction.   Electronically Signed   By: Arne Cleveland M.D.   On: 05/20/2014 21:59   Nm Hepato W/eject Fract  05/21/2014   CLINICAL DATA:  Right upper quadrant abdominal pain.  EXAM: NUCLEAR MEDICINE HEPATOBILIARY IMAGING WITH GALLBLADDER EF  TECHNIQUE: Sequential images of the abdomen were obtained out to 60 minutes following intravenous administration of radiopharmaceutical. After slow intravenous infusion of 1.64 micrograms Cholecystokinin, gallbladder ejection fraction was determined.  RADIOPHARMACEUTICALS:  5.0 Millicurie ZD-63O Choletec  COMPARISON:  CT scan of May 20, 2014.  FINDINGS: Normal uptake within hepatic parenchyma is noted. Normal filling of gallbladder is noted. Gallbladder ejection fraction of 100% was measured 30 min after CCK administration. At 30 min, normal ejection fraction is greater than 30%.  The patient did experience symptoms during CCK infusion.  IMPRESSION: Normal gallbladder ejection fraction following CCK administration.   Electronically Signed   By: Sabino Dick M.D.   On: 05/21/2014 18:13   Dg Chest Port 1 View  05/22/2014   CLINICAL DATA:  Fever  EXAM: PORTABLE CHEST - 1 VIEW  COMPARISON:  Radiograph 05/18/2014  FINDINGS: Normal cardiac silhouette. Elevation of the right hemidiaphragm. There is bibasilar atelectasis. No airspace disease identified. No pneumothorax. The right  apex is obscured by soft tissue.  IMPRESSION: 1. Bibasilar atelectasis versus infiltrates. 2. No interval change.   Electronically Signed   By: Suzy Bouchard M.D.   On: 05/22/2014 08:58   Dg Chest Port 1 View  05/18/2014   CLINICAL DATA:  Altered mental status.  EXAM: PORTABLE CHEST - 1 VIEW  COMPARISON:  11/27/2008  FINDINGS: The cardiomediastinal silhouette is within normal limits. The low patient has taken a shallower inspiration than on the prior study, and there is new elevation of the right hemidiaphragm. Mild opacity is present  in the right greater than left lung bases. No definite pleural effusion or pneumothorax is identified. Spinal stimulator is partially visualized. Severe right glenohumeral degenerative change is noted. Prior left shoulder arthroplasty is noted.  IMPRESSION: Shallow inspiration with elevation of the right hemidiaphragm and bibasilar atelectasis.   Electronically Signed   By: Logan Bores   On: 05/18/2014 16:46   US Abdomen Limited Ruq  05/19/2014   CLINICAL DATA:  Transaminitis.  History of colon cancer.  EXAM: US ABDOMEN LIMITED - RIGHT UPPER QUADRANT  COMPARISON:  CT abdomen and pelvis 12/16/2011  FINDINGS: Examination was limited due to patient's inability to move or take in a deep breath.  Gallbladder:  Multiple stones were present measuring up to 1.7 cm in size, including a 1.3 cm stone at the gallbladder neck. Gallbladder wall was not thickened, measuring 2.9 mm. Sonographic Murphy sign was negative.  Common bile duct:  Diameter: 3.5 mm  Liver:  Suboptimal evaluation with nonvisualization of the left lobe. No gross abnormalities identified in the visualized right lobe.  IMPRESSION: 1. Cholelithiasis without other evidence of acute cholecystitis. No biliary dilatation. 2. Limited evaluation of the liver as above.   Electronically Signed   By: Logan Bores   On: 05/19/2014 08:58    Micro Results      Recent Results (from the past 240 hour(s))  Urine culture     Status: None   Collection Time: 05/18/14  3:28 PM  Result Value Ref Range Status   Specimen Description URINE, CATHETERIZED  Final   Special Requests NONE  Final   Culture  Setup Time   Final    05/19/2014 01:10 Performed at [place] Performed at [facility]   Final   Culture NO GROWTH Performed at [facility]   Final   Report Status 05/20/2014 FINAL  Final  Blood Culture (routine x 2)     Status: None (Preliminary result)   Collection Time: 05/18/14  3:30 PM  Result  Value Ref Range Status   Specimen Description BLOOD RIGHT ARM  Final   Special Requests BOTTLES DRAWN AEROBIC AND ANAEROBIC 5 CC EACH  Final   Culture  Setup Time   Final    05/18/2014 23:28 Performed at [facility]    Culture   Final           BLOOD CULTURE RECEIVED NO GROWTH TO DATE CULTURE WILL BE HELD FOR 5 DAYS BEFORE ISSUING A FINAL NEGATIVE REPORT Performed at [facility]    Report Status PENDING  Incomplete  Blood Culture (routine x 2)     Status: None   Collection Time: 05/18/14  3:34 PM  Result Value Ref Range Status   Specimen Description BLOOD LEFT HAND  Final   Special Requests BOTTLES DRAWN AEROBIC AND ANAEROBIC 5 CC  Final   Culture  Setup Time   Final  05/18/2014 23:28 Performed at [facility]    Culture   Final    ENTEROBACTER AMNIGENUS Note: Gram Stain Report Called to,Read Back By and Verified With: SONYA COLUMBRES AT 2:09 A.M. ON 05/20/2014 WARB Performed at [facility]    Report Status 05/22/2014 FINAL  Final   Organism ID, Bacteria ENTEROBACTER AMNIGENUS  Final      Susceptibility   Enterobacter amnigenus - MIC*    CEFAZOLIN RESISTANT      CEFEPIME <=1 SENSITIVE Sensitive     CEFTAZIDIME <=1 SENSITIVE Sensitive     CEFTRIAXONE <=1 SENSITIVE Sensitive     CIPROFLOXACIN <=0.25 SENSITIVE Sensitive     GENTAMICIN <=1 SENSITIVE Sensitive     IMIPENEM <=0.25 SENSITIVE Sensitive     PIP/TAZO <=4 SENSITIVE Sensitive     TOBRAMYCIN <=1 SENSITIVE Sensitive     TRIMETH/SULFA <=20 SENSITIVE Sensitive     * ENTEROBACTER AMNIGENUS       Today   Subjective:   Paul Callahan today has no headache,no chest abdominal pain,no new weakness tingling or numbness, feels much better wants to go home today.   Objective:   Blood pressure 171/71, pulse 74, temperature 98.5 F (36.9 C), temperature source Oral, resp. rate 12, height 5\' 7"  (1.702 m), weight 79.697 kg (175 lb 11.2 oz), SpO2 95 %.   Intake/Output Summary (Last 24  hours) at 05/24/14 1133 Last data filed at 05/24/14 0600  Gross per 24 hour  Intake   1440 ml  Output    400 ml  Net   1040 ml    Exam Awake Alert, Oriented x 2, No new F.N deficits, Normal affect [place].AT,PERRAL Supple Neck,No JVD, No cervical lymphadenopathy appriciated.  Symmetrical Chest wall movement, Callahan air movement bilaterally, CTAB RRR,No Gallops,Rubs or new Murmurs, No Parasternal Heave +ve B.Sounds, Abd Soft, Non tender, No organomegaly appriciated, No rebound -guarding or rigidity. No Cyanosis, Clubbing or edema, No new Rash or bruise  Data Review   CBC w Diff: Lab Results  Component Value Date   WBC 6.1 05/24/2014   HGB 13.5 05/24/2014   HCT 39.8 05/24/2014   PLT 186 05/24/2014   LYMPHOPCT 6* 05/18/2014   MONOPCT 6 05/18/2014   EOSPCT 0 05/18/2014   BASOPCT 0 05/18/2014    CMP: Lab Results  Component Value Date   NA 139 05/24/2014   K 4.3 05/24/2014   CL 102 05/24/2014   CO2 20 05/24/2014   BUN 12 05/24/2014   CREATININE 0.73 05/24/2014   PROT 7.3 05/24/2014   ALBUMIN 3.2* 05/24/2014   BILITOT 0.8 05/24/2014   ALKPHOS 214* 05/24/2014   AST 43* 05/24/2014   ALT 11 05/24/2014  .   Total Time in preparing paper work, data evaluation and todays exam - 35 minutes  Thurnell Lose M.D on 05/24/2014 at 11:33 AM  [facility] Office  [phone]

## 2014-05-27 DIAGNOSIS — Z981 Arthrodesis status: Secondary | ICD-10-CM | POA: Diagnosis not present

## 2014-05-27 DIAGNOSIS — F0390 Unspecified dementia without behavioral disturbance: Secondary | ICD-10-CM | POA: Diagnosis not present

## 2014-05-27 DIAGNOSIS — K869 Disease of pancreas, unspecified: Secondary | ICD-10-CM | POA: Diagnosis not present

## 2014-05-27 DIAGNOSIS — M1990 Unspecified osteoarthritis, unspecified site: Secondary | ICD-10-CM | POA: Diagnosis not present

## 2014-05-27 DIAGNOSIS — Z8701 Personal history of pneumonia (recurrent): Secondary | ICD-10-CM | POA: Diagnosis not present

## 2014-05-27 DIAGNOSIS — G20 Parkinson's disease: Secondary | ICD-10-CM | POA: Diagnosis not present

## 2014-05-28 DIAGNOSIS — M1990 Unspecified osteoarthritis, unspecified site: Secondary | ICD-10-CM | POA: Diagnosis not present

## 2014-05-28 DIAGNOSIS — Z981 Arthrodesis status: Secondary | ICD-10-CM | POA: Diagnosis not present

## 2014-05-28 DIAGNOSIS — K869 Disease of pancreas, unspecified: Secondary | ICD-10-CM | POA: Diagnosis not present

## 2014-05-28 DIAGNOSIS — F0390 Unspecified dementia without behavioral disturbance: Secondary | ICD-10-CM | POA: Diagnosis not present

## 2014-05-28 DIAGNOSIS — G20 Parkinson's disease: Secondary | ICD-10-CM | POA: Diagnosis not present

## 2014-05-28 DIAGNOSIS — Z8701 Personal history of pneumonia (recurrent): Secondary | ICD-10-CM | POA: Diagnosis not present

## 2014-06-02 DIAGNOSIS — F0390 Unspecified dementia without behavioral disturbance: Secondary | ICD-10-CM | POA: Diagnosis not present

## 2014-06-02 DIAGNOSIS — G20 Parkinson's disease: Secondary | ICD-10-CM | POA: Diagnosis not present

## 2014-06-02 DIAGNOSIS — M1990 Unspecified osteoarthritis, unspecified site: Secondary | ICD-10-CM | POA: Diagnosis not present

## 2014-06-02 DIAGNOSIS — Z981 Arthrodesis status: Secondary | ICD-10-CM | POA: Diagnosis not present

## 2014-06-02 DIAGNOSIS — Z8701 Personal history of pneumonia (recurrent): Secondary | ICD-10-CM | POA: Diagnosis not present

## 2014-06-02 DIAGNOSIS — K869 Disease of pancreas, unspecified: Secondary | ICD-10-CM | POA: Diagnosis not present

## 2014-06-03 DIAGNOSIS — F0390 Unspecified dementia without behavioral disturbance: Secondary | ICD-10-CM | POA: Diagnosis not present

## 2014-06-03 DIAGNOSIS — G20 Parkinson's disease: Secondary | ICD-10-CM | POA: Diagnosis not present

## 2014-06-03 DIAGNOSIS — Z8701 Personal history of pneumonia (recurrent): Secondary | ICD-10-CM | POA: Diagnosis not present

## 2014-06-03 DIAGNOSIS — M1990 Unspecified osteoarthritis, unspecified site: Secondary | ICD-10-CM | POA: Diagnosis not present

## 2014-06-03 DIAGNOSIS — K869 Disease of pancreas, unspecified: Secondary | ICD-10-CM | POA: Diagnosis not present

## 2014-06-03 DIAGNOSIS — Z981 Arthrodesis status: Secondary | ICD-10-CM | POA: Diagnosis not present

## 2014-06-04 DIAGNOSIS — Z981 Arthrodesis status: Secondary | ICD-10-CM | POA: Diagnosis not present

## 2014-06-04 DIAGNOSIS — Z8701 Personal history of pneumonia (recurrent): Secondary | ICD-10-CM | POA: Diagnosis not present

## 2014-06-04 DIAGNOSIS — K869 Disease of pancreas, unspecified: Secondary | ICD-10-CM | POA: Diagnosis not present

## 2014-06-04 DIAGNOSIS — G20 Parkinson's disease: Secondary | ICD-10-CM | POA: Diagnosis not present

## 2014-06-04 DIAGNOSIS — F0390 Unspecified dementia without behavioral disturbance: Secondary | ICD-10-CM | POA: Diagnosis not present

## 2014-06-04 DIAGNOSIS — M1990 Unspecified osteoarthritis, unspecified site: Secondary | ICD-10-CM | POA: Diagnosis not present

## 2014-06-05 DIAGNOSIS — F0390 Unspecified dementia without behavioral disturbance: Secondary | ICD-10-CM | POA: Diagnosis not present

## 2014-06-05 DIAGNOSIS — M1990 Unspecified osteoarthritis, unspecified site: Secondary | ICD-10-CM | POA: Diagnosis not present

## 2014-06-05 DIAGNOSIS — G20 Parkinson's disease: Secondary | ICD-10-CM | POA: Diagnosis not present

## 2014-06-05 DIAGNOSIS — Z8701 Personal history of pneumonia (recurrent): Secondary | ICD-10-CM | POA: Diagnosis not present

## 2014-06-05 DIAGNOSIS — K869 Disease of pancreas, unspecified: Secondary | ICD-10-CM | POA: Diagnosis not present

## 2014-06-05 DIAGNOSIS — Z981 Arthrodesis status: Secondary | ICD-10-CM | POA: Diagnosis not present

## 2014-06-06 DIAGNOSIS — K869 Disease of pancreas, unspecified: Secondary | ICD-10-CM | POA: Diagnosis not present

## 2014-06-06 DIAGNOSIS — M1990 Unspecified osteoarthritis, unspecified site: Secondary | ICD-10-CM | POA: Diagnosis not present

## 2014-06-06 DIAGNOSIS — Z981 Arthrodesis status: Secondary | ICD-10-CM | POA: Diagnosis not present

## 2014-06-06 DIAGNOSIS — Z8701 Personal history of pneumonia (recurrent): Secondary | ICD-10-CM | POA: Diagnosis not present

## 2014-06-06 DIAGNOSIS — F0390 Unspecified dementia without behavioral disturbance: Secondary | ICD-10-CM | POA: Diagnosis not present

## 2014-06-06 DIAGNOSIS — G20 Parkinson's disease: Secondary | ICD-10-CM | POA: Diagnosis not present

## 2014-06-09 DIAGNOSIS — M1990 Unspecified osteoarthritis, unspecified site: Secondary | ICD-10-CM | POA: Diagnosis not present

## 2014-06-09 DIAGNOSIS — G20 Parkinson's disease: Secondary | ICD-10-CM | POA: Diagnosis not present

## 2014-06-09 DIAGNOSIS — Z981 Arthrodesis status: Secondary | ICD-10-CM | POA: Diagnosis not present

## 2014-06-09 DIAGNOSIS — Z8701 Personal history of pneumonia (recurrent): Secondary | ICD-10-CM | POA: Diagnosis not present

## 2014-06-09 DIAGNOSIS — F0390 Unspecified dementia without behavioral disturbance: Secondary | ICD-10-CM | POA: Diagnosis not present

## 2014-06-09 DIAGNOSIS — K869 Disease of pancreas, unspecified: Secondary | ICD-10-CM | POA: Diagnosis not present

## 2014-06-10 DIAGNOSIS — G20 Parkinson's disease: Secondary | ICD-10-CM | POA: Diagnosis not present

## 2014-06-10 DIAGNOSIS — F0390 Unspecified dementia without behavioral disturbance: Secondary | ICD-10-CM | POA: Diagnosis not present

## 2014-06-10 DIAGNOSIS — K869 Disease of pancreas, unspecified: Secondary | ICD-10-CM | POA: Diagnosis not present

## 2014-06-10 DIAGNOSIS — Z981 Arthrodesis status: Secondary | ICD-10-CM | POA: Diagnosis not present

## 2014-06-10 DIAGNOSIS — M1990 Unspecified osteoarthritis, unspecified site: Secondary | ICD-10-CM | POA: Diagnosis not present

## 2014-06-10 DIAGNOSIS — R932 Abnormal findings on diagnostic imaging of liver and biliary tract: Secondary | ICD-10-CM | POA: Diagnosis not present

## 2014-06-10 DIAGNOSIS — R740 Nonspecific elevation of levels of transaminase and lactic acid dehydrogenase [LDH]: Secondary | ICD-10-CM | POA: Diagnosis not present

## 2014-06-10 DIAGNOSIS — Z8701 Personal history of pneumonia (recurrent): Secondary | ICD-10-CM | POA: Diagnosis not present

## 2014-06-11 ENCOUNTER — Encounter (HOSPITAL_COMMUNITY): Payer: Self-pay | Admitting: *Deleted

## 2014-06-11 ENCOUNTER — Ambulatory Visit (HOSPITAL_COMMUNITY): Payer: Medicare Other

## 2014-06-11 DIAGNOSIS — K869 Disease of pancreas, unspecified: Secondary | ICD-10-CM | POA: Diagnosis not present

## 2014-06-11 DIAGNOSIS — F0390 Unspecified dementia without behavioral disturbance: Secondary | ICD-10-CM | POA: Diagnosis not present

## 2014-06-11 DIAGNOSIS — M1990 Unspecified osteoarthritis, unspecified site: Secondary | ICD-10-CM | POA: Diagnosis not present

## 2014-06-11 DIAGNOSIS — Z8701 Personal history of pneumonia (recurrent): Secondary | ICD-10-CM | POA: Diagnosis not present

## 2014-06-11 DIAGNOSIS — Z981 Arthrodesis status: Secondary | ICD-10-CM | POA: Diagnosis not present

## 2014-06-11 DIAGNOSIS — G20 Parkinson's disease: Secondary | ICD-10-CM | POA: Diagnosis not present

## 2014-06-12 DIAGNOSIS — M1990 Unspecified osteoarthritis, unspecified site: Secondary | ICD-10-CM | POA: Diagnosis not present

## 2014-06-12 DIAGNOSIS — G20 Parkinson's disease: Secondary | ICD-10-CM | POA: Diagnosis not present

## 2014-06-12 DIAGNOSIS — Z8701 Personal history of pneumonia (recurrent): Secondary | ICD-10-CM | POA: Diagnosis not present

## 2014-06-12 DIAGNOSIS — K869 Disease of pancreas, unspecified: Secondary | ICD-10-CM | POA: Diagnosis not present

## 2014-06-12 DIAGNOSIS — Z981 Arthrodesis status: Secondary | ICD-10-CM | POA: Diagnosis not present

## 2014-06-12 DIAGNOSIS — F0390 Unspecified dementia without behavioral disturbance: Secondary | ICD-10-CM | POA: Diagnosis not present

## 2014-06-13 DIAGNOSIS — Z981 Arthrodesis status: Secondary | ICD-10-CM | POA: Diagnosis not present

## 2014-06-13 DIAGNOSIS — K869 Disease of pancreas, unspecified: Secondary | ICD-10-CM | POA: Diagnosis not present

## 2014-06-13 DIAGNOSIS — G20 Parkinson's disease: Secondary | ICD-10-CM | POA: Diagnosis not present

## 2014-06-13 DIAGNOSIS — F0390 Unspecified dementia without behavioral disturbance: Secondary | ICD-10-CM | POA: Diagnosis not present

## 2014-06-13 DIAGNOSIS — M1990 Unspecified osteoarthritis, unspecified site: Secondary | ICD-10-CM | POA: Diagnosis not present

## 2014-06-13 DIAGNOSIS — Z8701 Personal history of pneumonia (recurrent): Secondary | ICD-10-CM | POA: Diagnosis not present

## 2014-06-16 DIAGNOSIS — Z8701 Personal history of pneumonia (recurrent): Secondary | ICD-10-CM | POA: Diagnosis not present

## 2014-06-16 DIAGNOSIS — G20 Parkinson's disease: Secondary | ICD-10-CM | POA: Diagnosis not present

## 2014-06-16 DIAGNOSIS — K869 Disease of pancreas, unspecified: Secondary | ICD-10-CM | POA: Diagnosis not present

## 2014-06-16 DIAGNOSIS — F0390 Unspecified dementia without behavioral disturbance: Secondary | ICD-10-CM | POA: Diagnosis not present

## 2014-06-16 DIAGNOSIS — Z981 Arthrodesis status: Secondary | ICD-10-CM | POA: Diagnosis not present

## 2014-06-16 DIAGNOSIS — M1990 Unspecified osteoarthritis, unspecified site: Secondary | ICD-10-CM | POA: Diagnosis not present

## 2014-06-17 ENCOUNTER — Other Ambulatory Visit: Payer: Self-pay | Admitting: Gastroenterology

## 2014-06-17 DIAGNOSIS — M1990 Unspecified osteoarthritis, unspecified site: Secondary | ICD-10-CM | POA: Diagnosis not present

## 2014-06-17 DIAGNOSIS — K869 Disease of pancreas, unspecified: Secondary | ICD-10-CM | POA: Diagnosis not present

## 2014-06-17 DIAGNOSIS — F0390 Unspecified dementia without behavioral disturbance: Secondary | ICD-10-CM | POA: Diagnosis not present

## 2014-06-17 DIAGNOSIS — Z981 Arthrodesis status: Secondary | ICD-10-CM | POA: Diagnosis not present

## 2014-06-17 DIAGNOSIS — Z8701 Personal history of pneumonia (recurrent): Secondary | ICD-10-CM | POA: Diagnosis not present

## 2014-06-17 DIAGNOSIS — G20 Parkinson's disease: Secondary | ICD-10-CM | POA: Diagnosis not present

## 2014-06-17 NOTE — Addendum Note (Signed)
Addended by: Arta Silence on: 06/17/2014 05:43 PM   Modules accepted: Orders

## 2014-06-17 NOTE — Anesthesia Preprocedure Evaluation (Addendum)
Anesthesia Evaluation  Patient identified by MRN, date of birth, ID band Patient awake    Reviewed: Allergy & Precautions, H&P , NPO status , Patient's Chart, lab work & pertinent test results, reviewed documented beta blocker date and time   Airway Mallampati: II  TM Distance: >3 FB Neck ROM: full    Dental  (+) Dental Advisory Given, Missing Partials front:   Pulmonary neg pulmonary ROS, former smoker,  breath sounds clear to auscultation  Pulmonary exam normal       Cardiovascular Exercise Tolerance: Good negative cardio ROS  Rhythm:regular Rate:Normal  Prolonged QT   Neuro/Psych Depression Parkinson's disease. Dementia  Neuromuscular disease negative psych ROS   GI/Hepatic negative GI ROS, Neg liver ROS, Pancreatic mass   Endo/Other  negative endocrine ROS  Renal/GU negative Renal ROS  negative genitourinary   Musculoskeletal   Abdominal   Peds  Hematology negative hematology ROS (+) thrombocytopenia   Anesthesia Other Findings   Reproductive/Obstetrics negative OB ROS                            Anesthesia Physical Anesthesia Plan  ASA: III  Anesthesia Plan: MAC   Post-op Pain Management:    Induction:   Airway Management Planned:   Additional Equipment:   Intra-op Plan:   Post-operative Plan:   Informed Consent: I have reviewed the patients History and Physical, chart, labs and discussed the procedure including the risks, benefits and alternatives for the proposed anesthesia with the patient or authorized representative who has indicated his/her understanding and acceptance.   Dental Advisory Given  Plan Discussed with: CRNA and Surgeon  Anesthesia Plan Comments:         Anesthesia Quick Evaluation

## 2014-06-18 ENCOUNTER — Ambulatory Visit (HOSPITAL_COMMUNITY)
Admission: RE | Admit: 2014-06-18 | Discharge: 2014-06-18 | Disposition: A | Discharge status: Home / Self Care | Payer: Medicare Other | Source: Ambulatory Visit | Attending: Gastroenterology | Admitting: Gastroenterology

## 2014-06-18 ENCOUNTER — Encounter (HOSPITAL_COMMUNITY): Payer: Self-pay | Admitting: Anesthesiology

## 2014-06-18 ENCOUNTER — Ambulatory Visit (HOSPITAL_COMMUNITY): Payer: Medicare Other | Admitting: Anesthesiology

## 2014-06-18 ENCOUNTER — Encounter (HOSPITAL_COMMUNITY)
Admission: RE | Disposition: A | Discharge status: Home / Self Care | Payer: Self-pay | Source: Ambulatory Visit | Attending: Gastroenterology

## 2014-06-18 DIAGNOSIS — R932 Abnormal findings on diagnostic imaging of liver and biliary tract: Secondary | ICD-10-CM | POA: Diagnosis not present

## 2014-06-18 DIAGNOSIS — G20 Parkinson's disease: Secondary | ICD-10-CM | POA: Insufficient documentation

## 2014-06-18 DIAGNOSIS — F329 Major depressive disorder, single episode, unspecified: Secondary | ICD-10-CM | POA: Diagnosis not present

## 2014-06-18 DIAGNOSIS — R970 Elevated carcinoembryonic antigen [CEA]: Secondary | ICD-10-CM | POA: Diagnosis not present

## 2014-06-18 DIAGNOSIS — R945 Abnormal results of liver function studies: Secondary | ICD-10-CM | POA: Diagnosis not present

## 2014-06-18 DIAGNOSIS — K862 Cyst of pancreas: Secondary | ICD-10-CM | POA: Diagnosis not present

## 2014-06-18 DIAGNOSIS — K869 Disease of pancreas, unspecified: Secondary | ICD-10-CM | POA: Diagnosis present

## 2014-06-18 DIAGNOSIS — Z87891 Personal history of nicotine dependence: Secondary | ICD-10-CM | POA: Diagnosis not present

## 2014-06-18 DIAGNOSIS — R935 Abnormal findings on diagnostic imaging of other abdominal regions, including retroperitoneum: Secondary | ICD-10-CM | POA: Diagnosis not present

## 2014-06-18 HISTORY — DX: Other reduced mobility: Z74.09

## 2014-06-18 HISTORY — PX: EUS: SHX5427

## 2014-06-18 LAB — PANC CYST FLD ANLYS-PATHFNDR-TG

## 2014-06-18 SURGERY — ESOPHAGEAL ENDOSCOPIC ULTRASOUND (EUS) RADIAL
Anesthesia: Monitor Anesthesia Care

## 2014-06-18 MED ORDER — KETAMINE HCL 10 MG/ML IJ SOLN
INTRAMUSCULAR | Status: AC
  Filled 2014-06-18: qty 1

## 2014-06-18 MED ORDER — BUTAMBEN-TETRACAINE-BENZOCAINE 2-2-14 % EX AERO
INHALATION_SPRAY | CUTANEOUS | Status: DC | PRN
  Administered 2014-06-18: 1 via TOPICAL

## 2014-06-18 MED ORDER — LACTATED RINGERS IV SOLN
INTRAVENOUS | Status: DC
  Administered 2014-06-18: 09:00:00 via INTRAVENOUS

## 2014-06-18 MED ORDER — LIDOCAINE HCL (CARDIAC) 20 MG/ML IV SOLN
INTRAVENOUS | Status: DC | PRN
  Administered 2014-06-18: 100 mg via INTRAVENOUS

## 2014-06-18 MED ORDER — PROPOFOL 10 MG/ML IV BOLUS
INTRAVENOUS | Status: AC
  Filled 2014-06-18: qty 20

## 2014-06-18 MED ORDER — CIPROFLOXACIN HCL 500 MG PO TABS: 500.0000 mg | ORAL_TABLET | Freq: Two times a day (BID) | ORAL | Status: DC

## 2014-06-18 MED ORDER — LIDOCAINE HCL (CARDIAC) 20 MG/ML IV SOLN
INTRAVENOUS | Status: AC
  Filled 2014-06-18: qty 5

## 2014-06-18 MED ORDER — PROPOFOL INFUSION 10 MG/ML OPTIME
INTRAVENOUS | Status: DC | PRN
  Administered 2014-06-18: 140 ug/kg/min via INTRAVENOUS

## 2014-06-18 MED ORDER — CIPROFLOXACIN IN D5W 400 MG/200ML IV SOLN
INTRAVENOUS | Status: AC
  Filled 2014-06-18: qty 200

## 2014-06-18 MED ORDER — KETAMINE HCL 10 MG/ML IJ SOLN
INTRAMUSCULAR | Status: DC | PRN
  Administered 2014-06-18: 20 mg via INTRAVENOUS

## 2014-06-18 MED ORDER — CIPROFLOXACIN IN D5W 400 MG/200ML IV SOLN
400.0000 mg | Freq: Once | INTRAVENOUS | Status: AC
  Administered 2014-06-18: 400 mg via INTRAVENOUS

## 2014-06-18 MED ORDER — SODIUM CHLORIDE 0.9 % IV SOLN: INTRAVENOUS | Status: DC

## 2014-06-18 MED ORDER — AMOXICILLIN-POT CLAVULANATE 875-125 MG PO TABS: 1.0000 | ORAL_TABLET | Freq: Two times a day (BID) | ORAL | Status: DC

## 2014-06-18 NOTE — Op Note (Signed)
[place], [address]   ENDOSCOPIC ULTRASOUND PROCEDURE REPORT  PATIENT: Paul, Callahan  MR#: [id number] BIRTHDATE: Dec 31, 1923  GENDER: male ENDOSCOPIST: Arta Silence, MD REFERRED BY:  [name], M.D.  Clarene Essex, M.D. PROCEDURE DATE:  06/18/2014 PROCEDURE:   Upper EUS w/FNA ASA CLASS:      Class III INDICATIONS:   1.  pancreatic mass (on CT), elevated Ca 19-9. MEDICATIONS: Monitored anesthesia care , Ciprofloxacin 400 mg IV  DESCRIPTION OF PROCEDURE:   After the risks benefits and alternatives of the procedure were  explained, informed consent was obtained. The patient was then placed in the left, lateral, decubitus postion and IV sedation was administered. Throughout the procedure, the patients blood pressure, pulse and oxygen saturations were monitored continuously.  Under direct visualization, the Pentax EUS Linear [id number]  endoscope was introduced through the mouth  and advanced to the second portion of the duodenum .  Water was used as necessary to provide an acoustic interface.  Upon completion of the imaging, water was removed and the patient was sent to the recovery room in satisfactory condition.    FINDINGS:      23mm x 26mm multilobular anechoic-cystic-appearing region seen in neck of pancreas.  Appears to be some communication with the pancreatic duct, which otherwise appears normal.  Cystic appear appears to have mural nodularity along posterior base. There is no peripancreatic adenopathy.  No solid pancreatic mass was seen.  Patient does not have chronic pancreatitis via EUS criteria.  At conclusion of procedure, cyst was punctured with 22-g needle and aspirated to collapse; 47mL of clear viscous fluid was aspirated.  IMPRESSION:     Pancreatic cyst.   Mural nodularity and septations noted, and can be seen with more aggressive cysts.  Suspect this is side-branch IPMN.  Cyst aspirated for  analysis.  RECOMMENDATIONS:     1.  Watch for potential complications of procedure. 2.  Await pancreatic cyst fluid results; sent for cytology, amylase, CEA. 3.  Ciprofloxacin 500 mg po bid x 5 days. 4.  Will discuss with [name].   _______________________________ Lorrin MaisArta Silence, MD 06/18/2014 10:27 AM   CC:

## 2014-06-18 NOTE — H&P (Signed)
Patient interval history reviewed.  Patient examined again.  There has been no change from documented H/P dated 06/10/14 (scanned into chart from our office) except as documented above.  Assessment:  1.  Pancreatic mass.  Plan:  1.  Endoscopic ultrasound with possible fine needle aspiration biopsies. 2.  Risks (bleeding, infection, bowel perforation that could require surgery, sedation-related changes in cardiopulmonary systems), benefits (identification and possible treatment of source of symptoms, exclusion of certain causes of symptoms), and alternatives (watchful waiting, radiographic imaging studies, empiric medical treatment) of upper endoscopy with ultrasound and possible biopsies (EUS +/- FNA) were explained to patient/family in detail and patient wishes to proceed.

## 2014-06-18 NOTE — Anesthesia Postprocedure Evaluation (Signed)
  Anesthesia Post-op Note  Patient: Paul Callahan  Procedure(s) Performed: Procedure(s) (LRB): ESOPHAGEAL ENDOSCOPIC ULTRASOUND (EUS) RADIAL (N/A)  Patient Location: PACU  Anesthesia Type: MAC  Level of Consciousness: awake and alert   Airway and Oxygen Therapy: Patient Spontanous Breathing  Post-op Pain: mild  Post-op Assessment: Post-op Vital signs reviewed, Patient's Cardiovascular Status Stable, Respiratory Function Stable, Patent Airway and No signs of Nausea or vomiting  Last Vitals:  Filed Vitals:   06/18/14 1040  BP: 176/84  Pulse: 60  Temp:   Resp: 14    Post-op Vital Signs: stable   Complications: No apparent anesthesia complications

## 2014-06-18 NOTE — Transfer of Care (Signed)
Immediate Anesthesia Transfer of Care Note  Patient: Paul Callahan  Procedure(s) Performed: Procedure(s): ESOPHAGEAL ENDOSCOPIC ULTRASOUND (EUS) RADIAL (N/A)  Patient Location: PACU and Endoscopy Unit  Anesthesia Type:MAC  Level of Consciousness: awake  Airway & Oxygen Therapy: Patient Spontanous Breathing and Patient connected to nasal cannula oxygen  Post-op Assessment: Report given to PACU RN and Post -op Vital signs reviewed and stable  Post vital signs: Reviewed and stable  Complications: No apparent anesthesia complications

## 2014-06-18 NOTE — Discharge Instructions (Signed)
Endoscopic UItrasound  Care After Please read the instructions outlined below and refer to this sheet in the next few weeks. These discharge instructions provide you with general information on caring for yourself after you leave the hospital. Your doctor may also give you specific instructions. While your treatment has been planned according to the most current medical practices available, unavoidable complications occasionally occur. If you have any problems or questions after discharge, please call [name] [facility]) at [phone].  HOME CARE INSTRUCTIONS Activity  You may resume your regular activity but move at a slower pace for the next 24 hours.   Take frequent rest periods for the next 24 hours.   Walking will help expel (get rid of) the air and reduce the bloated feeling in your abdomen.   No driving for 24 hours (because of the anesthesia (medicine) used during the test).   You may shower.   Do not sign any important legal documents or operate any machinery for 24 hours (because of the anesthesia used during the test).  Nutrition  Drink plenty of fluids.   You may resume your normal diet.   Begin with a light meal and progress to your normal diet.   Avoid alcoholic beverages for 24 hours or as instructed by your caregiver.  Medications You may resume your normal medications unless your caregiver tells you otherwise. What you can expect today  You may experience abdominal discomfort such as a feeling of fullness or "gas" pains.   You may experience a sore throat for 2 to 3 days. This is normal. Gargling with salt water may help this.    SEEK IMMEDIATE MEDICAL CARE IF:  You have excessive nausea (feeling sick to your stomach) and/or vomiting.   You have severe abdominal pain and distention (swelling).   You have trouble swallowing.   You have a temperature over 100 F (37.8 C).   You have rectal bleeding or vomiting of blood.  Document  Released: 02/02/2004 Document Revised: 03/02/2011 Document Reviewed: 08/15/2007 [facility] Patient Information 2012 [place].

## 2014-06-19 ENCOUNTER — Encounter (HOSPITAL_COMMUNITY): Payer: Self-pay | Admitting: Gastroenterology

## 2014-06-19 DIAGNOSIS — K869 Disease of pancreas, unspecified: Secondary | ICD-10-CM | POA: Diagnosis not present

## 2014-06-19 DIAGNOSIS — M1990 Unspecified osteoarthritis, unspecified site: Secondary | ICD-10-CM | POA: Diagnosis not present

## 2014-06-19 DIAGNOSIS — Z8701 Personal history of pneumonia (recurrent): Secondary | ICD-10-CM | POA: Diagnosis not present

## 2014-06-19 DIAGNOSIS — F0390 Unspecified dementia without behavioral disturbance: Secondary | ICD-10-CM | POA: Diagnosis not present

## 2014-06-19 DIAGNOSIS — G20 Parkinson's disease: Secondary | ICD-10-CM | POA: Diagnosis not present

## 2014-06-19 DIAGNOSIS — Z981 Arthrodesis status: Secondary | ICD-10-CM | POA: Diagnosis not present

## 2014-06-20 DIAGNOSIS — G20 Parkinson's disease: Secondary | ICD-10-CM | POA: Diagnosis not present

## 2014-06-20 DIAGNOSIS — Z8701 Personal history of pneumonia (recurrent): Secondary | ICD-10-CM | POA: Diagnosis not present

## 2014-06-20 DIAGNOSIS — M1990 Unspecified osteoarthritis, unspecified site: Secondary | ICD-10-CM | POA: Diagnosis not present

## 2014-06-20 DIAGNOSIS — K869 Disease of pancreas, unspecified: Secondary | ICD-10-CM | POA: Diagnosis not present

## 2014-06-20 DIAGNOSIS — Z981 Arthrodesis status: Secondary | ICD-10-CM | POA: Diagnosis not present

## 2014-06-20 DIAGNOSIS — F0390 Unspecified dementia without behavioral disturbance: Secondary | ICD-10-CM | POA: Diagnosis not present

## 2014-06-23 DIAGNOSIS — F0390 Unspecified dementia without behavioral disturbance: Secondary | ICD-10-CM | POA: Diagnosis not present

## 2014-06-23 DIAGNOSIS — G20 Parkinson's disease: Secondary | ICD-10-CM | POA: Diagnosis not present

## 2014-06-23 DIAGNOSIS — Z8701 Personal history of pneumonia (recurrent): Secondary | ICD-10-CM | POA: Diagnosis not present

## 2014-06-23 DIAGNOSIS — M1990 Unspecified osteoarthritis, unspecified site: Secondary | ICD-10-CM | POA: Diagnosis not present

## 2014-06-23 DIAGNOSIS — Z981 Arthrodesis status: Secondary | ICD-10-CM | POA: Diagnosis not present

## 2014-06-23 DIAGNOSIS — K869 Disease of pancreas, unspecified: Secondary | ICD-10-CM | POA: Diagnosis not present

## 2014-06-24 DIAGNOSIS — Z8701 Personal history of pneumonia (recurrent): Secondary | ICD-10-CM | POA: Diagnosis not present

## 2014-06-24 DIAGNOSIS — K869 Disease of pancreas, unspecified: Secondary | ICD-10-CM | POA: Diagnosis not present

## 2014-06-24 DIAGNOSIS — F0390 Unspecified dementia without behavioral disturbance: Secondary | ICD-10-CM | POA: Diagnosis not present

## 2014-06-24 DIAGNOSIS — M1990 Unspecified osteoarthritis, unspecified site: Secondary | ICD-10-CM | POA: Diagnosis not present

## 2014-06-24 DIAGNOSIS — G20 Parkinson's disease: Secondary | ICD-10-CM | POA: Diagnosis not present

## 2014-06-24 DIAGNOSIS — Z981 Arthrodesis status: Secondary | ICD-10-CM | POA: Diagnosis not present

## 2014-06-25 DIAGNOSIS — G20 Parkinson's disease: Secondary | ICD-10-CM | POA: Diagnosis not present

## 2014-06-25 DIAGNOSIS — Z8701 Personal history of pneumonia (recurrent): Secondary | ICD-10-CM | POA: Diagnosis not present

## 2014-06-25 DIAGNOSIS — K869 Disease of pancreas, unspecified: Secondary | ICD-10-CM | POA: Diagnosis not present

## 2014-06-25 DIAGNOSIS — M1990 Unspecified osteoarthritis, unspecified site: Secondary | ICD-10-CM | POA: Diagnosis not present

## 2014-06-25 DIAGNOSIS — F0390 Unspecified dementia without behavioral disturbance: Secondary | ICD-10-CM | POA: Diagnosis not present

## 2014-06-25 DIAGNOSIS — Z981 Arthrodesis status: Secondary | ICD-10-CM | POA: Diagnosis not present

## 2014-06-26 DIAGNOSIS — Z8701 Personal history of pneumonia (recurrent): Secondary | ICD-10-CM | POA: Diagnosis not present

## 2014-06-26 DIAGNOSIS — K869 Disease of pancreas, unspecified: Secondary | ICD-10-CM | POA: Diagnosis not present

## 2014-06-26 DIAGNOSIS — M1990 Unspecified osteoarthritis, unspecified site: Secondary | ICD-10-CM | POA: Diagnosis not present

## 2014-06-26 DIAGNOSIS — F0390 Unspecified dementia without behavioral disturbance: Secondary | ICD-10-CM | POA: Diagnosis not present

## 2014-06-26 DIAGNOSIS — Z981 Arthrodesis status: Secondary | ICD-10-CM | POA: Diagnosis not present

## 2014-06-26 DIAGNOSIS — G20 Parkinson's disease: Secondary | ICD-10-CM | POA: Diagnosis not present

## 2014-06-30 DIAGNOSIS — M1990 Unspecified osteoarthritis, unspecified site: Secondary | ICD-10-CM | POA: Diagnosis not present

## 2014-06-30 DIAGNOSIS — K869 Disease of pancreas, unspecified: Secondary | ICD-10-CM | POA: Diagnosis not present

## 2014-06-30 DIAGNOSIS — Z8701 Personal history of pneumonia (recurrent): Secondary | ICD-10-CM | POA: Diagnosis not present

## 2014-06-30 DIAGNOSIS — G20 Parkinson's disease: Secondary | ICD-10-CM | POA: Diagnosis not present

## 2014-06-30 DIAGNOSIS — F0390 Unspecified dementia without behavioral disturbance: Secondary | ICD-10-CM | POA: Diagnosis not present

## 2014-06-30 DIAGNOSIS — Z981 Arthrodesis status: Secondary | ICD-10-CM | POA: Diagnosis not present

## 2014-07-01 DIAGNOSIS — G20 Parkinson's disease: Secondary | ICD-10-CM | POA: Diagnosis not present

## 2014-07-01 DIAGNOSIS — M1990 Unspecified osteoarthritis, unspecified site: Secondary | ICD-10-CM | POA: Diagnosis not present

## 2014-07-01 DIAGNOSIS — Z981 Arthrodesis status: Secondary | ICD-10-CM | POA: Diagnosis not present

## 2014-07-01 DIAGNOSIS — F0390 Unspecified dementia without behavioral disturbance: Secondary | ICD-10-CM | POA: Diagnosis not present

## 2014-07-01 DIAGNOSIS — Z8701 Personal history of pneumonia (recurrent): Secondary | ICD-10-CM | POA: Diagnosis not present

## 2014-07-01 DIAGNOSIS — K869 Disease of pancreas, unspecified: Secondary | ICD-10-CM | POA: Diagnosis not present

## 2014-07-02 DIAGNOSIS — Z981 Arthrodesis status: Secondary | ICD-10-CM | POA: Diagnosis not present

## 2014-07-02 DIAGNOSIS — M1990 Unspecified osteoarthritis, unspecified site: Secondary | ICD-10-CM | POA: Diagnosis not present

## 2014-07-02 DIAGNOSIS — G20 Parkinson's disease: Secondary | ICD-10-CM | POA: Diagnosis not present

## 2014-07-02 DIAGNOSIS — K869 Disease of pancreas, unspecified: Secondary | ICD-10-CM | POA: Diagnosis not present

## 2014-07-02 DIAGNOSIS — F0390 Unspecified dementia without behavioral disturbance: Secondary | ICD-10-CM | POA: Diagnosis not present

## 2014-07-02 DIAGNOSIS — Z8701 Personal history of pneumonia (recurrent): Secondary | ICD-10-CM | POA: Diagnosis not present

## 2014-07-03 DIAGNOSIS — K869 Disease of pancreas, unspecified: Secondary | ICD-10-CM | POA: Diagnosis not present

## 2014-07-03 DIAGNOSIS — I10 Essential (primary) hypertension: Secondary | ICD-10-CM | POA: Diagnosis not present

## 2014-07-03 DIAGNOSIS — M1990 Unspecified osteoarthritis, unspecified site: Secondary | ICD-10-CM | POA: Diagnosis not present

## 2014-07-03 DIAGNOSIS — G20 Parkinson's disease: Secondary | ICD-10-CM | POA: Diagnosis not present

## 2014-07-03 DIAGNOSIS — Z8701 Personal history of pneumonia (recurrent): Secondary | ICD-10-CM | POA: Diagnosis not present

## 2014-07-03 DIAGNOSIS — R4182 Altered mental status, unspecified: Secondary | ICD-10-CM | POA: Diagnosis not present

## 2014-07-03 DIAGNOSIS — F0390 Unspecified dementia without behavioral disturbance: Secondary | ICD-10-CM | POA: Diagnosis not present

## 2014-07-03 DIAGNOSIS — Z981 Arthrodesis status: Secondary | ICD-10-CM | POA: Diagnosis not present

## 2014-07-09 DIAGNOSIS — K869 Disease of pancreas, unspecified: Secondary | ICD-10-CM | POA: Diagnosis not present

## 2014-07-09 DIAGNOSIS — Z981 Arthrodesis status: Secondary | ICD-10-CM | POA: Diagnosis not present

## 2014-07-09 DIAGNOSIS — M1990 Unspecified osteoarthritis, unspecified site: Secondary | ICD-10-CM | POA: Diagnosis not present

## 2014-07-09 DIAGNOSIS — G20 Parkinson's disease: Secondary | ICD-10-CM | POA: Diagnosis not present

## 2014-07-09 DIAGNOSIS — Z8701 Personal history of pneumonia (recurrent): Secondary | ICD-10-CM | POA: Diagnosis not present

## 2014-07-09 DIAGNOSIS — F0390 Unspecified dementia without behavioral disturbance: Secondary | ICD-10-CM | POA: Diagnosis not present

## 2014-07-10 DIAGNOSIS — Z8701 Personal history of pneumonia (recurrent): Secondary | ICD-10-CM | POA: Diagnosis not present

## 2014-07-10 DIAGNOSIS — M1990 Unspecified osteoarthritis, unspecified site: Secondary | ICD-10-CM | POA: Diagnosis not present

## 2014-07-10 DIAGNOSIS — F0390 Unspecified dementia without behavioral disturbance: Secondary | ICD-10-CM | POA: Diagnosis not present

## 2014-07-10 DIAGNOSIS — Z981 Arthrodesis status: Secondary | ICD-10-CM | POA: Diagnosis not present

## 2014-07-10 DIAGNOSIS — K869 Disease of pancreas, unspecified: Secondary | ICD-10-CM | POA: Diagnosis not present

## 2014-07-10 DIAGNOSIS — G20 Parkinson's disease: Secondary | ICD-10-CM | POA: Diagnosis not present

## 2014-07-14 DIAGNOSIS — K869 Disease of pancreas, unspecified: Secondary | ICD-10-CM | POA: Diagnosis not present

## 2014-07-14 DIAGNOSIS — F0390 Unspecified dementia without behavioral disturbance: Secondary | ICD-10-CM | POA: Diagnosis not present

## 2014-07-14 DIAGNOSIS — Z981 Arthrodesis status: Secondary | ICD-10-CM | POA: Diagnosis not present

## 2014-07-14 DIAGNOSIS — G20 Parkinson's disease: Secondary | ICD-10-CM | POA: Diagnosis not present

## 2014-07-14 DIAGNOSIS — Z8701 Personal history of pneumonia (recurrent): Secondary | ICD-10-CM | POA: Diagnosis not present

## 2014-07-14 DIAGNOSIS — M1990 Unspecified osteoarthritis, unspecified site: Secondary | ICD-10-CM | POA: Diagnosis not present

## 2014-07-15 DIAGNOSIS — G20 Parkinson's disease: Secondary | ICD-10-CM | POA: Diagnosis not present

## 2014-07-15 DIAGNOSIS — F0390 Unspecified dementia without behavioral disturbance: Secondary | ICD-10-CM | POA: Diagnosis not present

## 2014-07-15 DIAGNOSIS — K869 Disease of pancreas, unspecified: Secondary | ICD-10-CM | POA: Diagnosis not present

## 2014-07-15 DIAGNOSIS — M1990 Unspecified osteoarthritis, unspecified site: Secondary | ICD-10-CM | POA: Diagnosis not present

## 2014-07-15 DIAGNOSIS — Z981 Arthrodesis status: Secondary | ICD-10-CM | POA: Diagnosis not present

## 2014-07-15 DIAGNOSIS — Z8701 Personal history of pneumonia (recurrent): Secondary | ICD-10-CM | POA: Diagnosis not present

## 2014-07-17 DIAGNOSIS — M1990 Unspecified osteoarthritis, unspecified site: Secondary | ICD-10-CM | POA: Diagnosis not present

## 2014-07-17 DIAGNOSIS — K869 Disease of pancreas, unspecified: Secondary | ICD-10-CM | POA: Diagnosis not present

## 2014-07-17 DIAGNOSIS — F0390 Unspecified dementia without behavioral disturbance: Secondary | ICD-10-CM | POA: Diagnosis not present

## 2014-07-17 DIAGNOSIS — Z981 Arthrodesis status: Secondary | ICD-10-CM | POA: Diagnosis not present

## 2014-07-17 DIAGNOSIS — G20 Parkinson's disease: Secondary | ICD-10-CM | POA: Diagnosis not present

## 2014-07-17 DIAGNOSIS — Z8701 Personal history of pneumonia (recurrent): Secondary | ICD-10-CM | POA: Diagnosis not present

## 2014-07-21 DIAGNOSIS — G20 Parkinson's disease: Secondary | ICD-10-CM | POA: Diagnosis not present

## 2014-07-21 DIAGNOSIS — Z8701 Personal history of pneumonia (recurrent): Secondary | ICD-10-CM | POA: Diagnosis not present

## 2014-07-21 DIAGNOSIS — K869 Disease of pancreas, unspecified: Secondary | ICD-10-CM | POA: Diagnosis not present

## 2014-07-21 DIAGNOSIS — Z981 Arthrodesis status: Secondary | ICD-10-CM | POA: Diagnosis not present

## 2014-07-21 DIAGNOSIS — M1990 Unspecified osteoarthritis, unspecified site: Secondary | ICD-10-CM | POA: Diagnosis not present

## 2014-07-21 DIAGNOSIS — F0390 Unspecified dementia without behavioral disturbance: Secondary | ICD-10-CM | POA: Diagnosis not present

## 2014-07-22 DIAGNOSIS — Z981 Arthrodesis status: Secondary | ICD-10-CM | POA: Diagnosis not present

## 2014-07-22 DIAGNOSIS — M1990 Unspecified osteoarthritis, unspecified site: Secondary | ICD-10-CM | POA: Diagnosis not present

## 2014-07-22 DIAGNOSIS — K869 Disease of pancreas, unspecified: Secondary | ICD-10-CM | POA: Diagnosis not present

## 2014-07-22 DIAGNOSIS — Z8701 Personal history of pneumonia (recurrent): Secondary | ICD-10-CM | POA: Diagnosis not present

## 2014-07-22 DIAGNOSIS — G20 Parkinson's disease: Secondary | ICD-10-CM | POA: Diagnosis not present

## 2014-07-22 DIAGNOSIS — F0390 Unspecified dementia without behavioral disturbance: Secondary | ICD-10-CM | POA: Diagnosis not present

## 2014-08-11 DIAGNOSIS — R350 Frequency of micturition: Secondary | ICD-10-CM | POA: Diagnosis not present

## 2014-08-11 DIAGNOSIS — N390 Urinary tract infection, site not specified: Secondary | ICD-10-CM | POA: Diagnosis not present

## 2014-08-15 ENCOUNTER — Emergency Department (HOSPITAL_COMMUNITY): Payer: Medicare Other

## 2014-08-15 ENCOUNTER — Inpatient Hospital Stay (HOSPITAL_COMMUNITY)
Admission: EM | Admit: 2014-08-15 | Discharge: 2014-08-26 | DRG: 871 | Disposition: A | Discharge status: Hospice | Payer: Medicare Other | Attending: Internal Medicine | Admitting: Internal Medicine

## 2014-08-15 ENCOUNTER — Encounter (HOSPITAL_COMMUNITY): Payer: Self-pay | Admitting: *Deleted

## 2014-08-15 DIAGNOSIS — A419 Sepsis, unspecified organism: Secondary | ICD-10-CM | POA: Diagnosis not present

## 2014-08-15 DIAGNOSIS — G9340 Encephalopathy, unspecified: Secondary | ICD-10-CM | POA: Diagnosis present

## 2014-08-15 DIAGNOSIS — M79604 Pain in right leg: Secondary | ICD-10-CM

## 2014-08-15 DIAGNOSIS — K868 Other specified diseases of pancreas: Secondary | ICD-10-CM | POA: Diagnosis present

## 2014-08-15 DIAGNOSIS — N390 Urinary tract infection, site not specified: Secondary | ICD-10-CM

## 2014-08-15 DIAGNOSIS — Z889 Allergy status to unspecified drugs, medicaments and biological substances status: Secondary | ICD-10-CM

## 2014-08-15 DIAGNOSIS — D490 Neoplasm of unspecified behavior of digestive system: Secondary | ICD-10-CM | POA: Diagnosis present

## 2014-08-15 DIAGNOSIS — Z888 Allergy status to other drugs, medicaments and biological substances status: Secondary | ICD-10-CM

## 2014-08-15 DIAGNOSIS — I10 Essential (primary) hypertension: Secondary | ICD-10-CM | POA: Diagnosis present

## 2014-08-15 DIAGNOSIS — Z87891 Personal history of nicotine dependence: Secondary | ICD-10-CM | POA: Diagnosis not present

## 2014-08-15 DIAGNOSIS — K859 Acute pancreatitis without necrosis or infection, unspecified: Secondary | ICD-10-CM | POA: Diagnosis present

## 2014-08-15 DIAGNOSIS — K861 Other chronic pancreatitis: Secondary | ICD-10-CM | POA: Diagnosis not present

## 2014-08-15 DIAGNOSIS — K869 Disease of pancreas, unspecified: Secondary | ICD-10-CM | POA: Diagnosis present

## 2014-08-15 DIAGNOSIS — F0391 Unspecified dementia with behavioral disturbance: Secondary | ICD-10-CM | POA: Insufficient documentation

## 2014-08-15 DIAGNOSIS — Z515 Encounter for palliative care: Secondary | ICD-10-CM

## 2014-08-15 DIAGNOSIS — Z981 Arthrodesis status: Secondary | ICD-10-CM

## 2014-08-15 DIAGNOSIS — R630 Anorexia: Secondary | ICD-10-CM

## 2014-08-15 DIAGNOSIS — J690 Pneumonitis due to inhalation of food and vomit: Secondary | ICD-10-CM | POA: Diagnosis present

## 2014-08-15 DIAGNOSIS — Z9841 Cataract extraction status, right eye: Secondary | ICD-10-CM

## 2014-08-15 DIAGNOSIS — D72829 Elevated white blood cell count, unspecified: Secondary | ICD-10-CM | POA: Diagnosis not present

## 2014-08-15 DIAGNOSIS — Z87440 Personal history of urinary (tract) infections: Secondary | ICD-10-CM

## 2014-08-15 DIAGNOSIS — Z9842 Cataract extraction status, left eye: Secondary | ICD-10-CM | POA: Diagnosis not present

## 2014-08-15 DIAGNOSIS — Z885 Allergy status to narcotic agent status: Secondary | ICD-10-CM

## 2014-08-15 DIAGNOSIS — M79605 Pain in left leg: Secondary | ICD-10-CM | POA: Diagnosis not present

## 2014-08-15 DIAGNOSIS — N429 Disorder of prostate, unspecified: Secondary | ICD-10-CM | POA: Diagnosis present

## 2014-08-15 DIAGNOSIS — R40241 Glasgow coma scale score 13-15: Secondary | ICD-10-CM | POA: Diagnosis not present

## 2014-08-15 DIAGNOSIS — C189 Malignant neoplasm of colon, unspecified: Secondary | ICD-10-CM | POA: Diagnosis not present

## 2014-08-15 DIAGNOSIS — R509 Fever, unspecified: Secondary | ICD-10-CM | POA: Diagnosis not present

## 2014-08-15 DIAGNOSIS — R5081 Fever presenting with conditions classified elsewhere: Secondary | ICD-10-CM | POA: Diagnosis not present

## 2014-08-15 DIAGNOSIS — F0390 Unspecified dementia without behavioral disturbance: Secondary | ICD-10-CM | POA: Diagnosis not present

## 2014-08-15 DIAGNOSIS — G3183 Dementia with Lewy bodies: Secondary | ICD-10-CM | POA: Diagnosis present

## 2014-08-15 DIAGNOSIS — A4159 Other Gram-negative sepsis: Secondary | ICD-10-CM | POA: Diagnosis not present

## 2014-08-15 DIAGNOSIS — G20C Parkinsonism, unspecified: Secondary | ICD-10-CM | POA: Diagnosis present

## 2014-08-15 DIAGNOSIS — J9811 Atelectasis: Secondary | ICD-10-CM | POA: Diagnosis present

## 2014-08-15 DIAGNOSIS — G20 Parkinson's disease: Secondary | ICD-10-CM | POA: Diagnosis present

## 2014-08-15 DIAGNOSIS — F339 Major depressive disorder, recurrent, unspecified: Secondary | ICD-10-CM | POA: Diagnosis not present

## 2014-08-15 DIAGNOSIS — Z96612 Presence of left artificial shoulder joint: Secondary | ICD-10-CM | POA: Diagnosis present

## 2014-08-15 DIAGNOSIS — R1310 Dysphagia, unspecified: Secondary | ICD-10-CM

## 2014-08-15 DIAGNOSIS — Z881 Allergy status to other antibiotic agents status: Secondary | ICD-10-CM | POA: Diagnosis not present

## 2014-08-15 DIAGNOSIS — K862 Cyst of pancreas: Secondary | ICD-10-CM | POA: Diagnosis present

## 2014-08-15 DIAGNOSIS — J90 Pleural effusion, not elsewhere classified: Secondary | ICD-10-CM | POA: Diagnosis present

## 2014-08-15 DIAGNOSIS — Z66 Do not resuscitate: Secondary | ICD-10-CM | POA: Diagnosis present

## 2014-08-15 DIAGNOSIS — R627 Adult failure to thrive: Secondary | ICD-10-CM | POA: Diagnosis present

## 2014-08-15 DIAGNOSIS — R0602 Shortness of breath: Secondary | ICD-10-CM

## 2014-08-15 DIAGNOSIS — R531 Weakness: Secondary | ICD-10-CM | POA: Diagnosis not present

## 2014-08-15 DIAGNOSIS — Z79899 Other long term (current) drug therapy: Secondary | ICD-10-CM

## 2014-08-15 DIAGNOSIS — B9689 Other specified bacterial agents as the cause of diseases classified elsewhere: Secondary | ICD-10-CM | POA: Diagnosis not present

## 2014-08-15 DIAGNOSIS — Z85038 Personal history of other malignant neoplasm of large intestine: Secondary | ICD-10-CM | POA: Diagnosis not present

## 2014-08-15 DIAGNOSIS — IMO0001 Reserved for inherently not codable concepts without codable children: Secondary | ICD-10-CM | POA: Insufficient documentation

## 2014-08-15 DIAGNOSIS — R918 Other nonspecific abnormal finding of lung field: Secondary | ICD-10-CM | POA: Diagnosis not present

## 2014-08-15 DIAGNOSIS — F329 Major depressive disorder, single episode, unspecified: Secondary | ICD-10-CM | POA: Diagnosis present

## 2014-08-15 DIAGNOSIS — F03918 Unspecified dementia, unspecified severity, with other behavioral disturbance: Secondary | ICD-10-CM | POA: Insufficient documentation

## 2014-08-15 DIAGNOSIS — R109 Unspecified abdominal pain: Secondary | ICD-10-CM

## 2014-08-15 DIAGNOSIS — R0902 Hypoxemia: Secondary | ICD-10-CM

## 2014-08-15 DIAGNOSIS — A409 Streptococcal sepsis, unspecified: Secondary | ICD-10-CM

## 2014-08-15 DIAGNOSIS — J984 Other disorders of lung: Secondary | ICD-10-CM | POA: Diagnosis not present

## 2014-08-15 LAB — CBC WITH DIFFERENTIAL/PLATELET
BASOS ABS: 0 10*3/uL (ref 0.0–0.1)
BASOS PCT: 0 % (ref 0–1)
EOS ABS: 0 10*3/uL (ref 0.0–0.7)
EOS PCT: 0 % (ref 0–5)
HEMATOCRIT: 43.4 % (ref 39.0–52.0)
Hemoglobin: 14.8 g/dL (ref 13.0–17.0)
Lymphocytes Relative: 2 % — ABNORMAL LOW (ref 12–46)
Lymphs Abs: 0.4 10*3/uL — ABNORMAL LOW (ref 0.7–4.0)
MCH: 31.8 pg (ref 26.0–34.0)
MCHC: 34.1 g/dL (ref 30.0–36.0)
MCV: 93.1 fL (ref 78.0–100.0)
MONOS PCT: 6 % (ref 3–12)
Monocytes Absolute: 1.2 10*3/uL — ABNORMAL HIGH (ref 0.1–1.0)
Neutro Abs: 17.7 10*3/uL — ABNORMAL HIGH (ref 1.7–7.7)
Neutrophils Relative %: 92 % — ABNORMAL HIGH (ref 43–77)
Platelets: 162 10*3/uL (ref 150–400)
RBC: 4.66 MIL/uL (ref 4.22–5.81)
RDW: 14.3 % (ref 11.5–15.5)
WBC: 19.3 10*3/uL — AB (ref 4.0–10.5)

## 2014-08-15 LAB — URINALYSIS, ROUTINE W REFLEX MICROSCOPIC
Glucose, UA: NEGATIVE mg/dL
HGB URINE DIPSTICK: NEGATIVE
KETONES UR: 15 mg/dL — AB
LEUKOCYTES UA: NEGATIVE
NITRITE: NEGATIVE
Protein, ur: NEGATIVE mg/dL
Specific Gravity, Urine: 1.024 (ref 1.005–1.030)
Urobilinogen, UA: 0.2 mg/dL (ref 0.0–1.0)
pH: 5.5 (ref 5.0–8.0)

## 2014-08-15 LAB — BASIC METABOLIC PANEL
Anion gap: 7 (ref 5–15)
BUN: 27 mg/dL — AB (ref 6–23)
CHLORIDE: 105 mmol/L (ref 96–112)
CO2: 24 mmol/L (ref 19–32)
CREATININE: 1.09 mg/dL (ref 0.50–1.35)
Calcium: 8.9 mg/dL (ref 8.4–10.5)
GFR, EST AFRICAN AMERICAN: 68 mL/min — AB (ref >90)
GFR, EST NON AFRICAN AMERICAN: 59 mL/min — AB (ref >90)
Glucose, Bld: 128 mg/dL — ABNORMAL HIGH (ref 70–99)
POTASSIUM: 4.5 mmol/L (ref 3.5–5.1)
Sodium: 136 mmol/L (ref 135–145)

## 2014-08-15 LAB — I-STAT TROPONIN, ED: Troponin i, poc: 0.01 ng/mL (ref 0.00–0.08)

## 2014-08-15 LAB — I-STAT CG4 LACTIC ACID, ED: Lactic Acid, Venous: 2.65 mmol/L (ref 0.5–2.0)

## 2014-08-15 LAB — CG4 I-STAT (LACTIC ACID): LACTIC ACID, VENOUS: 2.36 mmol/L — AB (ref 0.5–2.0)

## 2014-08-15 MED ORDER — DEXTROSE 5 % IV SOLN
2.0000 g | Freq: Once | INTRAVENOUS | Status: AC
  Administered 2014-08-15: 2 g via INTRAVENOUS
  Filled 2014-08-15: qty 2

## 2014-08-15 MED ORDER — ACETAMINOPHEN 325 MG PO TABS
650.0000 mg | ORAL_TABLET | Freq: Once | ORAL | Status: AC
Start: 2014-08-15 — End: 2014-08-15
  Administered 2014-08-15: 650 mg via ORAL
  Filled 2014-08-15: qty 2

## 2014-08-15 MED ORDER — SODIUM CHLORIDE 0.9 % IV BOLUS (SEPSIS): 1000.0000 mL | Freq: Once | INTRAVENOUS | Status: DC

## 2014-08-15 MED ORDER — SODIUM CHLORIDE 0.9 % IV BOLUS (SEPSIS)
500.0000 mL | Freq: Once | INTRAVENOUS | Status: AC
  Administered 2014-08-15: 500 mL via INTRAVENOUS

## 2014-08-15 NOTE — ED Notes (Signed)
Patient denies pain and is resting comfortably.  

## 2014-08-15 NOTE — ED Notes (Signed)
Pt returned from [place] x 3 at bedside

## 2014-08-15 NOTE — ED Notes (Signed)
No injury noted

## 2014-08-15 NOTE — ED Notes (Signed)
Pt son (caregiver states that pt became weak and slid out of chair while having dinner tonight).  Pt alert and oriented per normal per son. Pt has slight droop to right corner of mouth which son states is normal for him.  MD at bedside

## 2014-08-15 NOTE — ED Notes (Signed)
Pt is sleeping , family at bedside

## 2014-08-15 NOTE — ED Notes (Signed)
Patient transported to X-ray 

## 2014-08-15 NOTE — ED Provider Notes (Signed)
Pt seen and evaluated.  D/W L. Parker PA.  Family at bedside. Patient examined. History reviewed. Patient presents today in a fashion very similar to an episode in November. A restless night last night. Daughter was called by patient's wife this morning. He arrived to find the patient leaning over the counter complaining of abdominal pain. He was weak. Symptoms progressed today. Next he slid out of the wheelchair onto the ground and was quite weak and difficulty getting back into the chair and family presents him here.  Noted have urinary tract infection by urinalysis on Tuesday. Started on initial antibiotic. This was changed on culture results last night to Augmentin.  Denies shortness of breath. Denies vomiting or diarrhea.  Today his urinalysis does not show recent infection. Blood and urine cultures are pending. 100.5 temp on arrival. Leukocytosis of 19,000. Lactate slightly high at 2.6.  Chest x-ray shows no acute abdomen eyes.  His abdomen is soft. Normal active bowel sounds. No peritoneal irritation.  Family states that he is weak and much different than typical day for him. He normally has difficulty with ADLs 2/2  orthopedic  Abnormalities, but not due to strength or lethargy.  Tanna Furry, MD 08/15/14 2136

## 2014-08-15 NOTE — ED Notes (Signed)
Attempted to call report. Rn will call back

## 2014-08-15 NOTE — ED Provider Notes (Signed)
CSN: [id number]     Arrival date & time 08/15/14  1846 History   First MD Initiated Contact with Patient 08/15/14 1903     No chief complaint on file.    (Consider location/radiation/quality/duration/timing/severity/associated sxs/prior Treatment) HPI Comments: The patient is a 81 year old male with past medical history dementia, Parkinson's, pancreatic mass, presents emergency room chief complaint of weakness. Per the patient's son the patient was being treated for a UTI. Today he was being transported with a seated chair, patient was weak and slumped to the ground.  He was unable to get back to seated position, with help from son and HHN. Patient's son reports attempting to lift him with a lifting belt around abdomen he became short of breath, dyspnea lasted for 2-3 minutes after lift attempt and the pt was laying on the ground. Patient's son reports decrease in mentation, normally oriented to person, place, time, similar presentation with sepsis in 05/2014. He reports right-sided facial droop baseline for patient. Patient son reports the patient was able to ambulate with a walker and orthopedic devices up until yesterday. Recently diagnosed with a UTI by PCP started on antibiotics, changed yesterday to [place]. Patient lives at home with wife has home health nurse from 9-1 Monday through Friday. PCP is Noah Delaine.  The history is provided by the patient. No language interpreter was used.    Past Medical History  Diagnosis Date  . Parkinson disease     'tremors are worsened at this time than before"  . Dementia   . Depression   . Bowel obstruction 12/15/2011  . Neuromuscular disorder     hx of parkinsons  . Arthritis   . Sepsis 05/20/2014  . Cancer     hx of colon cancer(surgery only)  . Impaired mobility     since hospital visit 11'15 overall weakness of legs -ambulates with walker" with assist   Past Surgical History  Procedure Laterality Date  . Cataracts Bilateral     '94,'95   . Hernia repair    . Back surgery      x 5  . Frozen shoulder    . Vasectomy    . Total shoulder arthroplasty Left   . Lumbar fusion    . Spinal cord stimulator implant    . Colon surgery      '88 colon resection(cancer)  . Eye surgery      strabismus  repair '58, laser surgery also after cataract surgery  . Eus N/A 06/18/2014    Procedure: ESOPHAGEAL ENDOSCOPIC ULTRASOUND (EUS) RADIAL;  Surgeon: Arta Silence, MD;  Location: [facility] END[facility]COPY;  Service: Endoscopy;  Laterality: N/A;   No family history on file. History  Substance Use Topics  . Smoking status: Former Research scientist (life sciences)  . Smokeless tobacco: Never Used  . Alcohol Use: No    Review of Systems  Unable to perform ROS: Dementia      Allergies  Amantadines; Fentanyl; Gabapentin; Oxycodone; Phenergan; and Ciprofloxacin  Home Medications   Prior to Admission medications   Medication Sig Start Date End Date Taking? Authorizing Provider  acetaminophen (TYLENOL) 500 MG tablet Take 1,000 mg by mouth 2 (two) times daily.    Historical Provider, MD  amoxicillin-clavulanate (AUGMENTIN) 875-125 MG per tablet Take 1 tablet by mouth 2 (two) times daily. 06/18/14   Arta Silence, MD  BIOTIN PO Take 2 tablets by mouth at bedtime.     Historical Provider, MD  CALCIUM-VITAMIN D PO Take by mouth.    Historical Provider, MD  carbidopa-levodopa Donnel Saxon  CR) 25-100 MG per tablet Take 1 tablet by mouth at bedtime.    Historical Provider, MD  carbidopa-levodopa (SINEMET IR) 25-100 MG per tablet Take 3 tablets by mouth 3 (three) times daily.    Historical Provider, MD  carvedilol (COREG) 3.125 MG tablet Take 1 tablet (3.125 mg total) by mouth 2 (two) times daily with a meal. Patient taking differently: Take 3.125 mg by mouth 2 (two) times daily with a meal. 06-21-14 Pt spouse states he is not taking this med. 05/24/14   Thurnell Lose, MD  celecoxib (CELEBREX) 200 MG capsule Take 200 mg by mouth daily with supper.    Historical Provider, MD   cholecalciferol (VITAMIN D) 1000 UNITS tablet Take 1,000 Units by mouth daily.     Historical Provider, MD  DULoxetine (CYMBALTA) 60 MG capsule Take 60 mg by mouth 2 (two) times daily.    Historical Provider, MD  EXELON 13.3 MG/24HR PT24 Place 13.9 mg onto the skin daily. 05/27/14   Historical Provider, MD  Multiple Vitamin (MULTIVITAMIN WITH MINERALS) TABS Take 1 tablet by mouth daily.    Historical Provider, MD  Multiple Vitamins-Minerals (ICAPS AREDS FORMULA PO) Take 1 capsule by mouth 2 (two) times daily.    Historical Provider, MD  pregabalin (LYRICA) 150 MG capsule Take 150 mg by mouth 2 (two) times daily.    Historical Provider, MD  Probiotic Product (RISAQUAD-2) CAPS 1 PO twice a day Patient taking differently: Take 1 capsule by mouth daily.  05/24/14   Thurnell Lose, MD  zinc gluconate 50 MG tablet Take 100 mg by mouth daily.     Historical Provider, MD   BP 125/63 mmHg  Pulse 86  Resp 24  Ht 5\' 7"  (1.702 m)  SpO2 96% Physical Exam  Constitutional: He appears well-developed and well-nourished.  HENT:  Head: Normocephalic and atraumatic.  Eyes: EOM are normal. Pupils are equal, round, and reactive to light.  Neck: Neck supple.  Cardiovascular: Normal rate and regular rhythm.   Pulmonary/Chest: Effort normal. No respiratory distress. He has no wheezes. He has no rales.  Abdominal: Soft. There is no tenderness. There is no rebound and no guarding.  Musculoskeletal: Normal range of motion.  Neurological: He is alert. He is disoriented. GCS eye subscore is 4. GCS verbal subscore is 5. GCS motor subscore is 6.  Disoriented to date. Oriented to person and place. Speech is clear and goal oriented, follows commands II-Visual fields were normal.   III/IV/VI-Pupils were equal and reacted. Extraocular movements were full and conjugate.  V/VII-mild right-sided facial droop with rest, softening of the nasal labial fold. Able to puff cheeks and hold air in.  VIII-normal.   Motor:  Strength 4/5 to upper and lower extremities bilaterally. Moves all 4 extremities equally. Sensory: normal sensation to upper and lower extremities.   Skin: Skin is warm and dry. He is not diaphoretic.  Nursing note and vitals reviewed.   ED Course  Procedures (including critical care time) Labs Review Labs Reviewed  URINALYSIS, ROUTINE W REFLEX MICROSCOPIC - Abnormal; Notable for the following:    Color, Urine AMBER (*)    APPearance CLOUDY (*)    Bilirubin Urine SMALL (*)    Ketones, ur 15 (*)    All other components within normal limits  CBC WITH DIFFERENTIAL/PLATELET - Abnormal; Notable for the following:    WBC 19.3 (*)    Neutrophils Relative % 92 (*)    Neutro Abs 17.7 (*)    Lymphocytes Relative 2 (*)  Lymphs Abs 0.4 (*)    Monocytes Absolute 1.2 (*)    All other components within normal limits  BASIC METABOLIC PANEL - Abnormal; Notable for the following:    Glucose, Bld 128 (*)    BUN 27 (*)    GFR calc non Af Amer 59 (*)    GFR calc Af Amer 68 (*)    All other components within normal limits  I-STAT CG4 LACTIC ACID, ED - Abnormal; Notable for the following:    Lactic Acid, Venous 2.65 (*)    All other components within normal limits  URINE CULTURE  CULTURE, BLOOD (ROUTINE X 2)  CULTURE, BLOOD (ROUTINE X 2)  I-STAT TROPOININ, ED  I-STAT CG4 LACTIC ACID, ED    Imaging Review Dg Chest 2 View  08/15/2014   CLINICAL DATA:  Dementia.  Urosepsis.  Weakness.  EXAM: CHEST  2 VIEW  COMPARISON:  05/22/14  FINDINGS: Low lung volumes are again noted. Both lungs remain clear. No evidence of pleural effusion. Left shoulder prosthesis is again demonstrated.  IMPRESSION: Stable low lung volumes.  No acute findings.   Electronically Signed   By: Earle Gell M.D.   On: 08/15/2014 20:48     EKG Interpretation None      MDM   Final diagnoses:  Weakness  UTI (lower urinary tract infection)  Leukocytosis   RN concerned about a possible right-sided facial droop and a code  stroke I was called to the bed. However son reports this is baseline for him and no other focal neurologic deficits were found. Pt with weakness, leukocytosis 19.3, rectal temperature 100.1, elevated lactate 2.65 recently treated for UTI, change antibiotics yesterday by PCP to Augmentin.  Awaiting chest x-ray and UA. EMR shows hospitalization in 05/2014 for sepsis with unclear source, negative, chest, UA, urine cultures, and blood cultures. That her days also evaluated the patient. Chest x-ray negative or acute findings, UA negative for UTI. Plan to admit for weakness, question UTI, leukocytosis. Dr. Jeneen Rinks discussed with hospitalist for admission.    Harvie Heck, PA-C 08/16/14 3536  Tanna Furry, MD 08/16/14 [phone]

## 2014-08-15 NOTE — ED Notes (Signed)
Hospitalist at bedside 

## 2014-08-15 NOTE — ED Notes (Signed)
Son states that he and his wife were attempting to get pt out of chair when he became shob and "GAVE OUT". Pt grips are strong and equal.  Pt is able to move both nlegs normally per son.

## 2014-08-15 NOTE — ED Notes (Signed)
Pt swallowed tylenol without problem.

## 2014-08-15 NOTE — ED Notes (Signed)
The patient is unable to give an urine specimen at this time. The tech has reported to the RN in charge. 

## 2014-08-16 ENCOUNTER — Inpatient Hospital Stay (HOSPITAL_COMMUNITY): Payer: Medicare Other

## 2014-08-16 DIAGNOSIS — A419 Sepsis, unspecified organism: Secondary | ICD-10-CM

## 2014-08-16 DIAGNOSIS — R509 Fever, unspecified: Secondary | ICD-10-CM

## 2014-08-16 DIAGNOSIS — F0390 Unspecified dementia without behavioral disturbance: Secondary | ICD-10-CM

## 2014-08-16 DIAGNOSIS — G9340 Encephalopathy, unspecified: Secondary | ICD-10-CM | POA: Diagnosis present

## 2014-08-16 DIAGNOSIS — A4159 Other Gram-negative sepsis: Secondary | ICD-10-CM | POA: Insufficient documentation

## 2014-08-16 DIAGNOSIS — IMO0001 Reserved for inherently not codable concepts without codable children: Secondary | ICD-10-CM | POA: Insufficient documentation

## 2014-08-16 DIAGNOSIS — N390 Urinary tract infection, site not specified: Secondary | ICD-10-CM

## 2014-08-16 DIAGNOSIS — R531 Weakness: Secondary | ICD-10-CM

## 2014-08-16 DIAGNOSIS — G20 Parkinson's disease: Secondary | ICD-10-CM

## 2014-08-16 DIAGNOSIS — D72829 Elevated white blood cell count, unspecified: Secondary | ICD-10-CM | POA: Diagnosis present

## 2014-08-16 LAB — BASIC METABOLIC PANEL
ANION GAP: 8 (ref 5–15)
BUN: 28 mg/dL — AB (ref 6–23)
CO2: 26 mmol/L (ref 19–32)
Calcium: 8.7 mg/dL (ref 8.4–10.5)
Chloride: 104 mmol/L (ref 96–112)
Creatinine, Ser: 1.19 mg/dL (ref 0.50–1.35)
GFR calc Af Amer: 61 mL/min — ABNORMAL LOW (ref >90)
GFR calc non Af Amer: 53 mL/min — ABNORMAL LOW (ref >90)
Glucose, Bld: 89 mg/dL (ref 70–99)
Potassium: 5.2 mmol/L — ABNORMAL HIGH (ref 3.5–5.1)
Sodium: 138 mmol/L (ref 135–145)

## 2014-08-16 LAB — CBC
HCT: 42.9 % (ref 39.0–52.0)
HEMOGLOBIN: 14.1 g/dL (ref 13.0–17.0)
MCH: 31.3 pg (ref 26.0–34.0)
MCHC: 32.9 g/dL (ref 30.0–36.0)
MCV: 95.1 fL (ref 78.0–100.0)
Platelets: 143 10*3/uL — ABNORMAL LOW (ref 150–400)
RBC: 4.51 MIL/uL (ref 4.22–5.81)
RDW: 14.6 % (ref 11.5–15.5)
WBC: 22.4 10*3/uL — ABNORMAL HIGH (ref 4.0–10.5)

## 2014-08-16 LAB — GLUCOSE, CAPILLARY: GLUCOSE-CAPILLARY: 134 mg/dL — AB (ref 70–99)

## 2014-08-16 MED ORDER — DULOXETINE HCL 60 MG PO CPEP
60.0000 mg | ORAL_CAPSULE | Freq: Two times a day (BID) | ORAL | Status: DC
  Administered 2014-08-16 – 2014-08-26 (×20): 60 mg via ORAL
  Filled 2014-08-16 (×24): qty 1

## 2014-08-16 MED ORDER — ADULT MULTIVITAMIN W/MINERALS CH
1.0000 | ORAL_TABLET | Freq: Every day | ORAL | Status: DC
  Administered 2014-08-16 – 2014-08-26 (×10): 1 via ORAL
  Filled 2014-08-16 (×12): qty 1

## 2014-08-16 MED ORDER — IOHEXOL 300 MG/ML  SOLN
100.0000 mL | Freq: Once | INTRAMUSCULAR | Status: AC | PRN
  Administered 2014-08-16: 100 mL via INTRAVENOUS

## 2014-08-16 MED ORDER — HYDROMORPHONE HCL 1 MG/ML IJ SOLN: 0.5000 mg | INTRAMUSCULAR | Status: DC | PRN

## 2014-08-16 MED ORDER — ONDANSETRON HCL 4 MG PO TABS: 4.0000 mg | ORAL_TABLET | Freq: Four times a day (QID) | ORAL | Status: DC | PRN

## 2014-08-16 MED ORDER — VITAMIN D3 25 MCG (1000 UNIT) PO TABS
2000.0000 [IU] | ORAL_TABLET | Freq: Every day | ORAL | Status: DC
  Administered 2014-08-16 – 2014-08-26 (×10): 2000 [IU] via ORAL
  Filled 2014-08-16 (×12): qty 2

## 2014-08-16 MED ORDER — CALCIUM CARBONATE-VITAMIN D 600-400 MG-UNIT PO TABS: 1.0000 | ORAL_TABLET | Freq: Two times a day (BID) | ORAL | Status: DC

## 2014-08-16 MED ORDER — PIPERACILLIN-TAZOBACTAM 3.375 G IVPB
3.3750 g | Freq: Three times a day (TID) | INTRAVENOUS | Status: DC
  Administered 2014-08-16: 3.375 g via INTRAVENOUS
  Filled 2014-08-16 (×4): qty 50

## 2014-08-16 MED ORDER — SODIUM CHLORIDE 0.9 % IV SOLN
INTRAVENOUS | Status: AC
  Administered 2014-08-16: via INTRAVENOUS

## 2014-08-16 MED ORDER — CALCIUM CARBONATE-VITAMIN D 500-200 MG-UNIT PO TABS
1.0000 | ORAL_TABLET | Freq: Two times a day (BID) | ORAL | Status: DC
  Filled 2014-08-16 (×2): qty 1

## 2014-08-16 MED ORDER — ZINC GLUCONATE 50 MG PO TABS: 100.0000 mg | ORAL_TABLET | Freq: Every evening | ORAL | Status: DC

## 2014-08-16 MED ORDER — ENOXAPARIN SODIUM 40 MG/0.4ML ~~LOC~~ SOLN
40.0000 mg | SUBCUTANEOUS | Status: DC
  Administered 2014-08-16 – 2014-08-26 (×11): 40 mg via SUBCUTANEOUS
  Filled 2014-08-16 (×12): qty 0.4

## 2014-08-16 MED ORDER — CARBIDOPA-LEVODOPA 25-100 MG PO TABS
3.0000 | ORAL_TABLET | Freq: Three times a day (TID) | ORAL | Status: DC
  Administered 2014-08-16 – 2014-08-26 (×29): 3 via ORAL
  Filled 2014-08-16 (×34): qty 3

## 2014-08-16 MED ORDER — PREGABALIN 75 MG PO CAPS
150.0000 mg | ORAL_CAPSULE | Freq: Two times a day (BID) | ORAL | Status: DC
  Administered 2014-08-16 – 2014-08-26 (×19): 150 mg via ORAL
  Filled 2014-08-16 (×20): qty 2

## 2014-08-16 MED ORDER — DEXTROSE 5 % IV SOLN
2.0000 g | Freq: Two times a day (BID) | INTRAVENOUS | Status: DC
  Filled 2014-08-16 (×2): qty 2

## 2014-08-16 MED ORDER — ZINC SULFATE 220 (50 ZN) MG PO CAPS
220.0000 mg | ORAL_CAPSULE | Freq: Every day | ORAL | Status: DC
Start: 2014-08-16 — End: 2014-08-26
  Administered 2014-08-16 – 2014-08-25 (×9): 220 mg via ORAL
  Filled 2014-08-16 (×11): qty 1

## 2014-08-16 MED ORDER — OCUVITE PO TABS
2.0000 | ORAL_TABLET | Freq: Every day | ORAL | Status: DC
Start: 2014-08-16 — End: 2014-08-26
  Administered 2014-08-16 – 2014-08-26 (×10): 2 via ORAL
  Filled 2014-08-16 (×12): qty 2

## 2014-08-16 MED ORDER — CARBIDOPA-LEVODOPA CR 25-100 MG PO TBCR
1.0000 | EXTENDED_RELEASE_TABLET | Freq: Every day | ORAL | Status: DC
  Administered 2014-08-16 – 2014-08-25 (×10): 1 via ORAL
  Filled 2014-08-16 (×12): qty 1

## 2014-08-16 MED ORDER — ACETAMINOPHEN 325 MG PO TABS
650.0000 mg | ORAL_TABLET | Freq: Four times a day (QID) | ORAL | Status: DC | PRN
  Administered 2014-08-17 – 2014-08-25 (×10): 650 mg via ORAL
  Filled 2014-08-16 (×11): qty 2

## 2014-08-16 MED ORDER — IOHEXOL 300 MG/ML  SOLN
25.0000 mL | INTRAMUSCULAR | Status: AC
  Administered 2014-08-16 (×2): 25 mL via ORAL

## 2014-08-16 MED ORDER — ONDANSETRON HCL 4 MG/2ML IJ SOLN: 4.0000 mg | Freq: Four times a day (QID) | INTRAMUSCULAR | Status: DC | PRN

## 2014-08-16 MED ORDER — PRESERVISION AREDS 2 PO CAPS: 2.0000 | ORAL_CAPSULE | Freq: Every morning | ORAL | Status: DC

## 2014-08-16 MED ORDER — ALUM & MAG HYDROXIDE-SIMETH 200-200-20 MG/5ML PO SUSP: 30.0000 mL | Freq: Four times a day (QID) | ORAL | Status: DC | PRN

## 2014-08-16 MED ORDER — RIVASTIGMINE 13.3 MG/24HR TD PT24
13.3000 mg | MEDICATED_PATCH | Freq: Every day | TRANSDERMAL | Status: DC
  Administered 2014-08-16 – 2014-08-26 (×11): 13.3 mg via TRANSDERMAL
  Filled 2014-08-16 (×11): qty 1

## 2014-08-16 MED ORDER — METRONIDAZOLE IN NACL 5-0.79 MG/ML-% IV SOLN
500.0000 mg | Freq: Three times a day (TID) | INTRAVENOUS | Status: DC
  Administered 2014-08-16 (×2): 500 mg via INTRAVENOUS
  Filled 2014-08-16 (×3): qty 100

## 2014-08-16 MED ORDER — ACETAMINOPHEN 650 MG RE SUPP: 650.0000 mg | Freq: Four times a day (QID) | RECTAL | Status: DC | PRN

## 2014-08-16 MED ORDER — DEXTROSE 5 % IV SOLN
7.0000 mg/kg | Freq: Once | INTRAVENOUS | Status: DC
  Filled 2014-08-16: qty 12.75

## 2014-08-16 NOTE — Progress Notes (Signed)
New Admission Note:   Arrival Method: Via stretcher from ED Mental Orientation:alert and oriented to self Telemetry: N/A Assessment: Completed Skin:See doc flowsheet IV: Lwrist Pain: N/A Tubes: N/A Safety Measures: Safety Fall Prevention Plan has been given, discussed Admission: Completed [address] [place] Orientation: Patient has been orientated to the room, unit and staff.  Family: At bedside  Orders have been reviewed and implemented. Will continue to monitor the patient. Call light has been placed within reach and bed alarm has been activated.   [employer], RN-BC Phone number: [phone]

## 2014-08-16 NOTE — H&P (Signed)
[facility] Admission History and Physical       Paul Callahan FBP:[id number] DOB: Dec 01, 1923 DOA: 08/15/2014  Referring physician: EDP PCP: [name], MD  Specialists:   Chief Complaint: Weakness and Fever  HPI: Paul Callahan is a 81 y.o. male who was brought to the ED due to complaints of ABD pain since last night along with weakness and increased somnolence and fevers today.  He  had been placed on Amoxicillin  initially for a UTI, which was changed to Augmentin/Clavulinic Acid 500 mg BID  1 days ago for a UT when his culture results returned.   He had been having Urinary Frequency and urgency for the past 2-3 weeks and was seen by his PCP and started on an antibiotic Rx.    Review of Systems: Unable to Obtain from the Patient   Past Medical History  Diagnosis Date  . Parkinson disease     'tremors are worsened at this time than before"  . Dementia   . Depression   . Bowel obstruction 12/15/2011  . Neuromuscular disorder     hx of parkinsons  . Arthritis   . Sepsis 05/20/2014  . Cancer     hx of colon cancer(surgery only)  . Impaired mobility     since hospital visit 11'15 overall weakness of legs -ambulates with walker" with assist     Past Surgical History  Procedure Laterality Date  . Cataracts Bilateral     '94,'95  . Hernia repair    . Back surgery      x 5  . Frozen shoulder    . Vasectomy    . Total shoulder arthroplasty Left   . Lumbar fusion    . Spinal cord stimulator implant    . Colon surgery      '88 colon resection(cancer)  . Eye surgery      strabismus  repair '58, laser surgery also after cataract surgery  . Eus N/A 06/18/2014    Procedure: ESOPHAGEAL ENDOSCOPIC ULTRASOUND (EUS) RADIAL;  Surgeon: Arta Silence, MD;  Location: [facility];  Service: Endoscopy;  Laterality: N/A;      Prior to Admission medications   Medication Sig Start Date End Date Taking? Authorizing Provider  acetaminophen (TYLENOL) 500 MG tablet Take  1,000 mg by mouth every morning.    Yes Historical Provider, MD  acetaminophen (TYLENOL) 650 MG CR tablet Take 650 mg by mouth every evening.   Yes Historical Provider, MD  amoxicillin-clavulanate (AUGMENTIN) 875-125 MG per tablet Take 1 tablet by mouth 2 (two) times daily. Patient taking differently: Take 1 tablet by mouth 2 (two) times daily. Started 08/14/14, for 7 days ending 08/20/14 06/18/14  Yes Arta Silence, MD  Biotin 3 MG TABS Take 6 mg by mouth every evening.   Yes Historical Provider, MD  Calcium Carb-Cholecalciferol (CALCIUM 600 + D PO) Take 1 tablet by mouth 2 (two) times daily.   Yes Historical Provider, MD  carbidopa-levodopa (SINEMET CR) 25-100 MG per tablet Take 1 tablet by mouth at bedtime.   Yes Historical Provider, MD  carbidopa-levodopa (SINEMET IR) 25-100 MG per tablet Take 3 tablets by mouth 3 (three) times daily.   Yes Historical Provider, MD  celecoxib (CELEBREX) 200 MG capsule Take 200 mg by mouth daily with supper.   Yes Historical Provider, MD  cholecalciferol (VITAMIN D) 1000 UNITS tablet Take 2,000 Units by mouth daily.    Yes Historical Provider, MD  DULoxetine (CYMBALTA) 60 MG capsule Take 60 mg by mouth 2 (  two) times daily.   Yes Historical Provider, MD  EXELON 13.3 MG/24HR PT24 Place 13.3 mg onto the skin daily.  05/27/14  Yes Historical Provider, MD  Multiple Vitamin (MULTIVITAMIN WITH MINERALS) TABS Take 1 tablet by mouth daily.   Yes Historical Provider, MD  Multiple Vitamins-Minerals (PRESERVISION AREDS 2) CAPS Take 2 capsules by mouth every morning.   Yes Historical Provider, MD  pregabalin (LYRICA) 150 MG capsule Take 150 mg by mouth 2 (two) times daily.   Yes Historical Provider, MD  zinc gluconate 50 MG tablet Take 100 mg by mouth every evening.    Yes Historical Provider, MD  carvedilol (COREG) 3.125 MG tablet Take 1 tablet (3.125 mg total) by mouth 2 (two) times daily with a meal. Patient taking differently: Take 3.125 mg by mouth 2 (two) times daily with  a meal. 06-21-14 Pt spouse states he is not taking this med. 05/24/14   Thurnell Lose, MD  Probiotic Product (RISAQUAD-2) CAPS 1 PO twice a day Patient taking differently: Take 1 capsule by mouth daily.  05/24/14   Thurnell Lose, MD     Allergies  Allergen Reactions  . Amantadines Other (See Comments)    Weakness in legs and hallucinations  . Fentanyl Other (See Comments)    Confusion and disorientation   . Gabapentin Other (See Comments)    Weakness in legs and hallucinations  . Oxycodone Other (See Comments)    Confusion and disorientation   . Phenergan [Promethazine Hcl] Other (See Comments)    Confusion and disorientation   . Vancomycin Itching, Rash and Other (See Comments)    Red man's syndrome   . Ciprofloxacin Diarrhea and Rash    Social History:  reports that he has quit smoking. He has never used smokeless tobacco. He reports that he does not drink alcohol or use illicit drugs.    History reviewed. No pertinent family history.     Physical Exam:  GEN:  Pleasant Elderly  81 y.o. Caucuasian male examined and in no acute distress; cooperative with exam Filed Vitals:   08/15/14 2130 08/15/14 2200 08/15/14 2230 08/15/14 2323  BP:  98/56 110/57 120/85  Pulse: 87 85 87 87  Temp:    98.4 F (36.9 C)  TempSrc:    Axillary  Resp: 21 20 20 18   Height:    5\' 7"  (1.702 m)  Weight:    83.3 kg (183 lb 10.3 oz)  SpO2: 93% 92% 92% 92%   Blood pressure 120/85, pulse 87, temperature 98.4 F (36.9 C), temperature source Axillary, resp. rate 18, height 5\' 7"  (1.702 m), weight 83.3 kg (183 lb 10.3 oz), SpO2 92 %. PSYCH: He is alert and oriented x 2; does not appear anxious does not appear depressed; affect is normal HEENT: Normocephalic and Atraumatic, Mucous membranes pink; PERRLA; EOM intact; Fundi:  Benign;  No scleral icterus, Nares: Patent, Oropharynx: Clear, Edentulous,    Neck:  FROM, No Cervical Lymphadenopathy nor Thyromegaly or Carotid Bruit; No JVD; Breasts:: Not  examined CHEST WALL: No tenderness CHEST: Normal respiration, clear to auscultation bilaterally HEART: Regular rate and rhythm; no murmurs rubs or gallops BACK: No kyphosis or scoliosis; No CVA tenderness ABDOMEN: Positive Bowel Sounds, Soft Non-Tender, No Rebound or Guarding; No Masses, No Organomegaly, No Pannus; No Intertriginous candida. Rectal Exam: Not done EXTREMITIES: No Cyanosis, Clubbing, or Edema; No Ulcerations.RLE is Shorter and Atrophic, LLE> RLE Genitalia: not examined PULSES: 2+ and symmetric SKIN: Normal hydration no rash or ulceration CNS:  Alert  and Oriented x 2, No Acute Focal Deficits Vascular: pulses palpable throughout    Labs on Admission:  Basic Metabolic Panel:  Recent Labs Lab 08/15/14 1948  NA 136  K 4.5  CL 105  CO2 24  GLUCOSE 128*  BUN 27*  CREATININE 1.09  CALCIUM 8.9   Liver Function Tests: No results for input(s): AST, ALT, ALKPHOS, BILITOT, PROT, ALBUMIN in the last 168 hours. No results for input(s): LIPASE, AMYLASE in the last 168 hours. No results for input(s): AMMONIA in the last 168 hours. CBC:  Recent Labs Lab 08/15/14 1948  WBC 19.3*  NEUTROABS 17.7*  HGB 14.8  HCT 43.4  MCV 93.1  PLT 162   Cardiac Enzymes: No results for input(s): CKTOTAL, CKMB, CKMBINDEX, TROPONINI in the last 168 hours.  BNP (last 3 results) No results for input(s): BNP in the last 8760 hours.  ProBNP (last 3 results) No results for input(s): PROBNP in the last 8760 hours.  CBG: No results for input(s): GLUCAP in the last 168 hours.  Radiological Exams on Admission: Dg Chest 2 View  08/15/2014   CLINICAL DATA:  Dementia.  Urosepsis.  Weakness.  EXAM: CHEST  2 VIEW  COMPARISON:  05/22/14  FINDINGS: Low lung volumes are again noted. Both lungs remain clear. No evidence of pleural effusion. Left shoulder prosthesis is again demonstrated.  IMPRESSION: Stable low lung volumes.  No acute findings.   Electronically Signed   By: Earle Gell M.D.   On:  08/15/2014 20:48     EKG: Independently reviewed.    Assessment/Plan:   81 y.o. male with  Principal Problem:   1.   Sepsis   Blood and Urine Cxs sent   IV Cefepime only for Now  Since primary Site is most likely Urine and he has had 1 days of Augmentin Rx already    So the UTI may ne partially treated, and a Urine C+S has been sent   IVFs   Active Problems:   2.   UTI (lower urinary tract infection)   IV Cefepime   Urine C+S Sent     3.   Weakness- Due to #1 and #2     4.   Acute encephalopathy- Due to #1 and #2     Monitor      5.    Parkinsonism   Continue Sinemet Rx      6.   Dementia without behavioral disturbance   Continue Exelon Rx     7.   Leukocytosis- WBCs= 19.3   Monitor Trend     8.   DVT Prophylaxis    Lovenox     Code Status:  FULL CODE      Family Communication:    Family at Bedside Disposition Plan:      Inpatient Med/Surg   Time spent:  [place] [facility] Pager [phone]   If [place] Please Contact the Day Rounding Team MD for [facility]  If 7PM-7AM, Please Contact Night-Floor Coverage  [email or web address] Password [email or web address] 08/16/2014, 12:15 AM     ADDENDUM:   Patient was seen and examined on 08/16/2014

## 2014-08-16 NOTE — Consult Note (Signed)
[name]    Date of Admission:  08/15/2014  Date of Consult:  08/16/2014  Reason for Consult: FUO Referring Physician: Dr. Sanjuana Letters   HPI: Paul Callahan is an 81 y.o. male with Parkinson's disease dementia who is brought to [facility] department due to complaints of abdominal pain weakness and somnolence and fevers. Apparently abdominal on amoxicillin and Augmentin for a "urinary tract infection. Do not have access to that office visit with his primary care doctor nor to have access to the those cultures.  Of note the patient did have bacteremia in November with Enterobacter AMNIGENUS that was most definitely not from a urinary source as blood in his urine cultures were negative. He did have a CT scan done during that admission to help workup the source of his infection and this showed a 2.8 cm pancreatic mass suggested of neoplasm. Otherwise her is no significant findings on CT scan.  His initial films here on x-ray showed no acute infiltrates to suggest pneumonia.his urinalysis was actually negative for leukocytes or nitrites.  He did have blood cultures and urine cultures obtained but these are still pending. (the urine cultures should not have been obtained given no pyuria  Has been on cefepime and this morning he was going to get tobramycin but asked Dr. Sanjuana Letters  to stop the tobramycin given my concern for risk of nephrotoxicity in this patient      Past Medical History  Diagnosis Date  . Parkinson disease     'tremors are worsened at this time than before"  . Dementia   . Depression   . Bowel obstruction 12/15/2011  . Neuromuscular disorder     hx of parkinsons  . Arthritis   . Sepsis 05/20/2014  . Cancer     hx of colon cancer(surgery only)  . Impaired mobility     since hospital visit 11'15 overall weakness of legs -ambulates with walker" with assist    Past Surgical History  Procedure Laterality Date  . Cataracts Bilateral     '94,'95  .  Hernia repair    . Back surgery      x 5  . Frozen shoulder    . Vasectomy    . Total shoulder arthroplasty Left   . Lumbar fusion    . Spinal cord stimulator implant    . Colon surgery      '88 colon resection(cancer)  . Eye surgery      strabismus  repair '58, laser surgery also after cataract surgery  . Eus N/A 06/18/2014    Procedure: ESOPHAGEAL ENDOSCOPIC ULTRASOUND (EUS) RADIAL;  Surgeon: Arta Silence, MD;  Location: [facility];  Service: Endoscopy;  Laterality: N/A;  ergies:   Allergies  Allergen Reactions  . Amantadines Other (See Comments)    Weakness in legs and hallucinations  . Fentanyl Other (See Comments)    Confusion and disorientation   . Gabapentin Other (See Comments)    Weakness in legs and hallucinations  . Oxycodone Other (See Comments)    Confusion and disorientation   . Phenergan [Promethazine Hcl] Other (See Comments)    Confusion and disorientation   . Vancomycin Itching, Rash and Other (See Comments)    Red man's syndrome   . Ciprofloxacin Diarrhea and Rash     Medications: I have reviewed patients current medications as documented in Epic Anti-infectives    Start     Dose/Rate Route Frequency Ordered Stop   08/16/14 1000  ceFEPIme (MAXIPIME) 2  g in dextrose 5 % 50 mL IVPB  Status:  Discontinued     2 g 100 mL/hr over 30 Minutes Intravenous Every 12 hours 08/16/14 0013 08/16/14 0841   08/16/14 1000  piperacillin-tazobactam (ZOSYN) IVPB 3.375 g     3.375 g 12.5 mL/hr over 240 Minutes Intravenous Every 8 hours 08/16/14 0852     08/16/14 1000  metroNIDAZOLE (FLAGYL) IVPB 500 mg     500 mg 100 mL/hr over 60 Minutes Intravenous Every 8 hours 08/16/14 0923     08/16/14 0915  tobramycin (NEBCIN) 510 mg in dextrose 5 % 100 mL IVPB  Status:  Discontinued     7 mg/kg  73 kg (Adjusted) 112.8 mL/hr over 60 Minutes Intravenous  Once 08/16/14 0914 08/16/14 0920   08/15/14 2215  ceFEPIme (MAXIPIME) 2 g in dextrose 5 % 50 mL IVPB     2 g 100 mL/hr  over 30 Minutes Intravenous  Once 08/15/14 2210 08/15/14 2254      Social History:  reports that he has quit smoking. He has never used smokeless tobacco. He reports that he does not drink alcohol or use illicit drugs.  History reviewed. No pertinent family history.  As in HPI and primary teams notes otherwise 12 point review of systems is negative  Blood pressure 133/86, pulse 96, temperature 99.7 F (37.6 C), temperature source Axillary, resp. rate 18, height 5' 7"  (1.702 m), weight 183 lb 10.3 oz (83.3 kg), SpO2 94 %. General: Alert and awake, he cannot recall much of the specifics of why he came to the hospital HEENT: anicteric sclera, pupils reactive to light and accommodation, EOMI, oropharynx dryCVS regular rate, normal r,  no murmur rubs or gallops Chest: clear to auscultation bilaterally, no wheezing, rales or rhonchi Abdomen: soft nontender, nondistended, normal bowel sounds, Extremities:  Neuro: he has fairly rigid muscle tone and clonus with pushing on his feet.  Skin no clear ulcerations on feet or extremities   Results for orders placed or performed during the hospital encounter of 08/15/14 (from the past 48 hour(s))  CBC with Differential     Status: Abnormal   Collection Time: 08/15/14  7:48 PM  Result Value Ref Range   WBC 19.3 (H) 4.0 - 10.5 K/uL   RBC 4.66 4.22 - 5.81 MIL/uL   Hemoglobin 14.8 13.0 - 17.0 g/dL   HCT 43.4 39.0 - 52.0 %   MCV 93.1 78.0 - 100.0 fL   MCH 31.8 26.0 - 34.0 pg   MCHC 34.1 30.0 - 36.0 g/dL   RDW 14.3 11.5 - 15.5 %   Platelets 162 150 - 400 K/uL   Neutrophils Relative % 92 (H) 43 - 77 %   Neutro Abs 17.7 (H) 1.7 - 7.7 K/uL   Lymphocytes Relative 2 (L) 12 - 46 %   Lymphs Abs 0.4 (L) 0.7 - 4.0 K/uL   Monocytes Relative 6 3 - 12 %   Monocytes Absolute 1.2 (H) 0.1 - 1.0 K/uL   Eosinophils Relative 0 0 - 5 %   Eosinophils Absolute 0.0 0.0 - 0.7 K/uL   Basophils Relative 0 0 - 1 %   Basophils Absolute 0.0 0.0 - 0.1 K/uL  Basic  metabolic panel     Status: Abnormal   Collection Time: 08/15/14  7:48 PM  Result Value Ref Range   Sodium 136 135 - 145 mmol/L   Potassium 4.5 3.5 - 5.1 mmol/L   Chloride 105 96 - 112 mmol/L   CO2 24 19 -  32 mmol/L   Glucose, Bld 128 (H) 70 - 99 mg/dL   BUN 27 (H) 6 - 23 mg/dL   Creatinine, Ser 1.09 0.50 - 1.35 mg/dL   Calcium 8.9 8.4 - 10.5 mg/dL   GFR calc non Af Amer 59 (L) >90 mL/min   GFR calc Af Amer 68 (L) >90 mL/min    Comment: (NOTE) The eGFR has been calculated using the CKD EPI equation. This calculation has not been validated in all clinical situations. eGFR's persistently <90 mL/min signify possible Chronic Kidney Disease.    Anion gap 7 5 - 15  I-stat troponin, ED     Status: None   Collection Time: 08/15/14  7:50 PM  Result Value Ref Range   Troponin i, poc 0.01 0.00 - 0.08 ng/mL   Comment 3            Comment: Due to the release kinetics of cTnI, a negative result within the first hours of the onset of symptoms does not rule out myocardial infarction with certainty. If myocardial infarction is still suspected, repeat the test at appropriate intervals.   I-Stat CG4 Lactic Acid, ED     Status: Abnormal   Collection Time: 08/15/14  7:53 PM  Result Value Ref Range   Lactic Acid, Venous 2.65 (HH) 0.5 - 2.0 mmol/L   Comment NOTIFIED PHYSICIAN   Urinalysis, Routine w reflex microscopic     Status: Abnormal   Collection Time: 08/15/14  8:45 PM  Result Value Ref Range   Color, Urine AMBER (A) YELLOW    Comment: BIOCHEMICALS MAY BE AFFECTED BY COLOR   APPearance CLOUDY (A) CLEAR   Specific Gravity, Urine 1.024 1.005 - 1.030   pH 5.5 5.0 - 8.0   Glucose, UA NEGATIVE NEGATIVE mg/dL   Hgb urine dipstick NEGATIVE NEGATIVE   Bilirubin Urine SMALL (A) NEGATIVE   Ketones, ur 15 (A) NEGATIVE mg/dL   Protein, ur NEGATIVE NEGATIVE mg/dL   Urobilinogen, UA 0.2 0.0 - 1.0 mg/dL   Nitrite NEGATIVE NEGATIVE   Leukocytes, UA NEGATIVE NEGATIVE    Comment: MICROSCOPIC NOT  DONE ON URINES WITH NEGATIVE PROTEIN, BLOOD, LEUKOCYTES, NITRITE, OR GLUCOSE <1000 mg/dL.  CG4 I-STAT (Lactic acid)     Status: Abnormal   Collection Time: 08/15/14 11:03 PM  Result Value Ref Range   Lactic Acid, Venous 2.36 (HH) 0.5 - 2.0 mmol/L   Comment NOTIFIED PHYSICIAN   Basic metabolic panel     Status: Abnormal   Collection Time: 08/16/14  5:11 AM  Result Value Ref Range   Sodium 138 135 - 145 mmol/L   Potassium 5.2 (H) 3.5 - 5.1 mmol/L   Chloride 104 96 - 112 mmol/L   CO2 26 19 - 32 mmol/L   Glucose, Bld 89 70 - 99 mg/dL   BUN 28 (H) 6 - 23 mg/dL   Creatinine, Ser 1.19 0.50 - 1.35 mg/dL   Calcium 8.7 8.4 - 10.5 mg/dL   GFR calc non Af Amer 53 (L) >90 mL/min   GFR calc Af Amer 61 (L) >90 mL/min    Comment: (NOTE) The eGFR has been calculated using the CKD EPI equation. This calculation has not been validated in all clinical situations. eGFR's persistently <90 mL/min signify possible Chronic Kidney Disease.    Anion gap 8 5 - 15  CBC     Status: Abnormal   Collection Time: 08/16/14  5:11 AM  Result Value Ref Range   WBC 22.4 (H) 4.0 - 10.5 K/uL  RBC 4.51 4.22 - 5.81 MIL/uL   Hemoglobin 14.1 13.0 - 17.0 g/dL   HCT 42.9 39.0 - 52.0 %   MCV 95.1 78.0 - 100.0 fL   MCH 31.3 26.0 - 34.0 pg   MCHC 32.9 30.0 - 36.0 g/dL   RDW 14.6 11.5 - 15.5 %   Platelets 143 (L) 150 - 400 K/uL   @BRIEFLABTABLE (sdes,specrequest,cult,reptstatus)   )No results found for this or any previous visit (from the past 720 hour(s)).   Impression/Recommendation  Principal Problem:   Sepsis Active Problems:   Parkinsonism   Dementia without behavioral disturbance   UTI (lower urinary tract infection)   Weakness   Leukocytosis   Acute encephalopathy   MCLAIN FREER is a 81 y.o. male with  With admission in November for sepsis due to Enterobacter at which time he was found to have an area in his pancreas special's for neoplasm now admitted again with a septic picture after having been on  antibiotics for "urinary tract infection "  #1 Sepsis fever unknown origin:  --agree with repeat CT of the chest abdomen pelvis with contrast.  --Okay to continue the cefepime for now would not add nephrotoxic agents  --Can embark on further investigation for FUO but I think that is imaging is the most important part of this point time  --Follow-up culture data     08/16/2014, 2:49 PM   Thank you so much for this interesting consult  [facility] [phone] (pager) [phone] (office) 08/16/2014, 2:49 PM  [place] 08/16/2014, 2:49 PM

## 2014-08-16 NOTE — Progress Notes (Addendum)
ANTIBIOTIC CONSULT NOTE - INITIAL  Pharmacy Consult for Zosyn & Tobramycin Indication: Aspiration PNA / sepsis  Allergies  Allergen Reactions  . Amantadines Other (See Comments)    Weakness in legs and hallucinations  . Fentanyl Other (See Comments)    Confusion and disorientation   . Gabapentin Other (See Comments)    Weakness in legs and hallucinations  . Oxycodone Other (See Comments)    Confusion and disorientation   . Phenergan [Promethazine Hcl] Other (See Comments)    Confusion and disorientation   . Vancomycin Itching, Rash and Other (See Comments)    Red man's syndrome   . Ciprofloxacin Diarrhea and Rash    Patient Measurements: Height: 5\' 7"  (170.2 cm) Weight: 183 lb 10.3 oz (83.3 kg) IBW/kg (Calculated) : 66.1  ABW - 73kg  Vital Signs: Temp: 98.2 F (36.8 C) (02/13 0636) Temp Source: Axillary (02/13 0636) BP: 110/62 mmHg (02/13 0636) Pulse Rate: 95 (02/13 0636) Intake/Output from previous day: 02/12 0701 - 02/13 0700 In: 461.3 [I.V.:461.3] Out: 201 [Urine:201] Intake/Output from this shift:    Labs:  Recent Labs  08/15/14 1948 08/16/14 0511  WBC 19.3* 22.4*  HGB 14.8 14.1  PLT 162 143*  CREATININE 1.09 1.19   Estimated Creatinine Clearance: 44.3 mL/min (by C-G formula based on Cr of 1.19). No results for input(s): VANCOTROUGH, VANCOPEAK, VANCORANDOM, GENTTROUGH, GENTPEAK, GENTRANDOM, TOBRATROUGH, TOBRAPEAK, TOBRARND, AMIKACINPEAK, AMIKACINTROU, AMIKACIN in the last 72 hours.   Microbiology: No results found for this or any previous visit (from the past 720 hour(s)).  Medical History: Past Medical History  Diagnosis Date  . Parkinson disease     'tremors are worsened at this time than before"  . Dementia   . Depression   . Bowel obstruction 12/15/2011  . Neuromuscular disorder     hx of parkinsons  . Arthritis   . Sepsis 05/20/2014  . Cancer     hx of colon cancer(surgery only)  . Impaired mobility     since hospital visit 11'15  overall weakness of legs -ambulates with walker" with assist    Medications:  Prescriptions prior to admission  Medication Sig Dispense Refill Last Dose  . acetaminophen (TYLENOL) 500 MG tablet Take 1,000 mg by mouth every morning.    08/15/2014 at Unknown time  . acetaminophen (TYLENOL) 650 MG CR tablet Take 650 mg by mouth every evening.   08/15/2014 at Unknown time  . amoxicillin-clavulanate (AUGMENTIN) 875-125 MG per tablet Take 1 tablet by mouth 2 (two) times daily. (Patient taking differently: Take 1 tablet by mouth 2 (two) times daily. Started 08/14/14, for 7 days ending 08/20/14) 10 tablet 0 08/15/2014 at Unknown time  . Biotin 3 MG TABS Take 6 mg by mouth every evening.   08/15/2014 at Unknown time  . Calcium Carb-Cholecalciferol (CALCIUM 600 + D PO) Take 1 tablet by mouth 2 (two) times daily.   08/15/2014 at Unknown time  . carbidopa-levodopa (SINEMET CR) 25-100 MG per tablet Take 1 tablet by mouth at bedtime.   08/14/2014 at Unknown time  . carbidopa-levodopa (SINEMET IR) 25-100 MG per tablet Take 3 tablets by mouth 3 (three) times daily.   08/15/2014 at Unknown time  . celecoxib (CELEBREX) 200 MG capsule Take 200 mg by mouth daily with supper.   08/15/2014 at Unknown time  . cholecalciferol (VITAMIN D) 1000 UNITS tablet Take 2,000 Units by mouth daily.    08/15/2014 at Unknown time  . DULoxetine (CYMBALTA) 60 MG capsule Take 60 mg by mouth 2 (two)  times daily.   08/15/2014 at am  . EXELON 13.3 MG/24HR PT24 Place 13.3 mg onto the skin daily.   11 08/15/2014 at Unknown time  . Multiple Vitamin (MULTIVITAMIN WITH MINERALS) TABS Take 1 tablet by mouth daily.   08/15/2014 at Unknown time  . Multiple Vitamins-Minerals (PRESERVISION AREDS 2) CAPS Take 2 capsules by mouth every morning.   08/15/2014 at Unknown time  . pregabalin (LYRICA) 150 MG capsule Take 150 mg by mouth 2 (two) times daily.   08/15/2014 at am  . zinc gluconate 50 MG tablet Take 100 mg by mouth every evening.    08/14/2014 at Unknown time   . carvedilol (COREG) 3.125 MG tablet Take 1 tablet (3.125 mg total) by mouth 2 (two) times daily with a meal. (Patient taking differently: Take 3.125 mg by mouth 2 (two) times daily with a meal. 06-21-14 Pt spouse states he is not taking this med.) 60 tablet 0   . Probiotic Product (RISAQUAD-2) CAPS 1 PO twice a day (Patient taking differently: Take 1 capsule by mouth daily. ) 60 capsule 0 06/17/2014 at Unknown time   Assessment: 81 yo M presents on 2/12 with weakness and fever. Also reports abdominal pain. Had been having urinary frequency for the past 2-3 weeks and was taking amoxicillin and then augmentin PTA. PMH includes parkinson disease, dementia, depression, hx of colon cancer s/p surgery. Pharmacy to dose Zosyn for aspiration PNA. Received cefepime 2g x 1 in the ED last night. Afebrile, WBC elevated. CrCl ~41ml/min.  Goal of Therapy:  Resolution of infection  Plan:  Start Zosyn 3.375g IV Q8 Monitor renal function, WBC, clinical picture Consider changing to Unasyn? F/U need for abx  Reginia Naas 08/16/2014,8:47 AM   Addendum: Pharmacy to also dose tobramycin for sepsis. Pt has documented reports of red man syndrome with itching and rash however I am unable to ask pt about it due to current neurological status. It is documented as high severity so will plan to start tobramycin. Will use extended interval dosing. ID consulted as well.  Goal of Therapy: Target Peak 6-8mg /L Target Trough < 1mg /L  Plan: Start Tobramycin 510mg  IV x 1 Monitor renal function, clinical picture Check 10 hr random level  F/U interval dosing from level, ID recommendations

## 2014-08-16 NOTE — Progress Notes (Addendum)
I have seen and examined Paul Callahan at bedside in the presence of his son, and reviewed his chart. There Paul Callahan is an 81 year old male with Parkinson's disease/dementia multiple drug allergies, among other medical problems, who came in with generalized weakness associated with shortness of breath and was found to be septic with white count of 19,000 despite outpatient antibiotics for close to a week(?Augmentin) given for UTI. Chest x-ray was unrevealing but this could be because patient is dry. There is a possibility that he may be aspirating. He was hospitalized in November 2015 and at that time blood cultures grew ENTEROBACTER AMNIGENUS. Urine culture/blood cultures so far unrevealing. White count has increased to 22,000 despite Cefepime started last night. There is no report of diarrhea. Will therefore repeat chest x-ray(consider CT chest without contrast/ct abdomen/pelvis with contrast), obtain 2D echo, and change antibiotics to Zosyn/Tobramycin for now, per pharmacy(appreciate help), and consult ID, ?further work up, antibiotic Management. Will also request urine culture results from Dr. Jolyn Lent office as possible. Please refer to Dr. Arnoldo Morale' comprehensive H&P and care plan for details as patient was admitted earlier today. Spoke with Dr Clayborn Bigness who will graciously see patient in consult(Appreciate help). He recommended to d/c Tobramycin for now and re image abdomen/chest. Will therefore d/c Tobramycin and obtain CT chest/abdomen/pelvis.

## 2014-08-16 NOTE — Progress Notes (Signed)
I have noted his CT results. I do not see anything that is in clear cut need of treatment with antibiotics.  He has atelectasis on CT scan of lungs. Pancreatic mass that is slightly smaller and could be pseudocyst vs neoplasm.  I am stopping his antibiotics at this point. There is no necrotic pancreatitis or clear cut abscess  Would consider whether this area on CT can be sampled by [place] or CT.  I doubt that he would comply with MRI breathing requirements.  Would recommend discussing with IR and GI.

## 2014-08-17 DIAGNOSIS — F03918 Unspecified dementia, unspecified severity, with other behavioral disturbance: Secondary | ICD-10-CM | POA: Insufficient documentation

## 2014-08-17 DIAGNOSIS — K869 Disease of pancreas, unspecified: Secondary | ICD-10-CM | POA: Diagnosis present

## 2014-08-17 DIAGNOSIS — J9811 Atelectasis: Secondary | ICD-10-CM | POA: Diagnosis present

## 2014-08-17 DIAGNOSIS — K859 Acute pancreatitis without necrosis or infection, unspecified: Secondary | ICD-10-CM | POA: Diagnosis present

## 2014-08-17 DIAGNOSIS — J690 Pneumonitis due to inhalation of food and vomit: Secondary | ICD-10-CM | POA: Diagnosis present

## 2014-08-17 DIAGNOSIS — F0391 Unspecified dementia with behavioral disturbance: Secondary | ICD-10-CM | POA: Insufficient documentation

## 2014-08-17 LAB — PHOSPHORUS: PHOSPHORUS: 2.4 mg/dL (ref 2.3–4.6)

## 2014-08-17 LAB — COMPREHENSIVE METABOLIC PANEL
ALBUMIN: 2.9 g/dL — AB (ref 3.5–5.2)
ALT: 10 U/L (ref 0–53)
ANION GAP: 9 (ref 5–15)
AST: 41 U/L — AB (ref 0–37)
Alkaline Phosphatase: 129 U/L — ABNORMAL HIGH (ref 39–117)
BUN: 26 mg/dL — AB (ref 6–23)
CO2: 25 mmol/L (ref 19–32)
Calcium: 8.6 mg/dL (ref 8.4–10.5)
Chloride: 103 mmol/L (ref 96–112)
Creatinine, Ser: 1.27 mg/dL (ref 0.50–1.35)
GFR calc non Af Amer: 49 mL/min — ABNORMAL LOW (ref >90)
GFR, EST AFRICAN AMERICAN: 56 mL/min — AB (ref >90)
GLUCOSE: 127 mg/dL — AB (ref 70–99)
Potassium: 4 mmol/L (ref 3.5–5.1)
Sodium: 137 mmol/L (ref 135–145)
Total Bilirubin: 1.2 mg/dL (ref 0.3–1.2)
Total Protein: 6.3 g/dL (ref 6.0–8.3)

## 2014-08-17 LAB — URINE CULTURE
COLONY COUNT: NO GROWTH
Culture: NO GROWTH

## 2014-08-17 LAB — LIPASE, BLOOD: Lipase: 85 U/L — ABNORMAL HIGH (ref 11–59)

## 2014-08-17 LAB — CBC WITH DIFFERENTIAL/PLATELET
Basophils Absolute: 0 10*3/uL (ref 0.0–0.1)
Basophils Relative: 0 % (ref 0–1)
EOS PCT: 0 % (ref 0–5)
Eosinophils Absolute: 0 10*3/uL (ref 0.0–0.7)
HEMATOCRIT: 39.5 % (ref 39.0–52.0)
Hemoglobin: 12.8 g/dL — ABNORMAL LOW (ref 13.0–17.0)
LYMPHS ABS: 0.8 10*3/uL (ref 0.7–4.0)
Lymphocytes Relative: 4 % — ABNORMAL LOW (ref 12–46)
MCH: 30.6 pg (ref 26.0–34.0)
MCHC: 32.4 g/dL (ref 30.0–36.0)
MCV: 94.5 fL (ref 78.0–100.0)
Monocytes Absolute: 1.1 10*3/uL — ABNORMAL HIGH (ref 0.1–1.0)
Monocytes Relative: 5 % (ref 3–12)
NEUTROS PCT: 91 % — AB (ref 43–77)
Neutro Abs: 19.4 10*3/uL — ABNORMAL HIGH (ref 1.7–7.7)
Platelets: 150 10*3/uL (ref 150–400)
RBC: 4.18 MIL/uL — ABNORMAL LOW (ref 4.22–5.81)
RDW: 14.9 % (ref 11.5–15.5)
WBC: 21.4 10*3/uL — ABNORMAL HIGH (ref 4.0–10.5)

## 2014-08-17 LAB — MAGNESIUM: MAGNESIUM: 2.1 mg/dL (ref 1.5–2.5)

## 2014-08-17 LAB — PROTIME-INR
INR: 1.24 (ref 0.00–1.49)
Prothrombin Time: 15.8 seconds — ABNORMAL HIGH (ref 11.6–15.2)

## 2014-08-17 MED ORDER — ALBUTEROL SULFATE (2.5 MG/3ML) 0.083% IN NEBU
2.5000 mg | INHALATION_SOLUTION | RESPIRATORY_TRACT | Status: DC | PRN
  Administered 2014-08-18 (×2): 2.5 mg via RESPIRATORY_TRACT
  Filled 2014-08-17 (×2): qty 3

## 2014-08-17 MED ORDER — ERTAPENEM SODIUM 1 G IJ SOLR
1.0000 g | INTRAMUSCULAR | Status: DC
  Filled 2014-08-17: qty 1

## 2014-08-17 MED ORDER — SODIUM CHLORIDE 0.9 % IV SOLN
INTRAVENOUS | Status: DC
  Administered 2014-08-17 (×2): via INTRAVENOUS

## 2014-08-17 MED ORDER — SODIUM CHLORIDE 0.9 % IV SOLN
1.0000 g | INTRAVENOUS | Status: DC
  Administered 2014-08-17 – 2014-08-22 (×6): 1 g via INTRAVENOUS
  Filled 2014-08-17 (×7): qty 1

## 2014-08-17 NOTE — Progress Notes (Signed)
[facility]        Subjective: Was eating meal, more coughing this am   Antibiotics:  Anti-infectives    Start     Dose/Rate Route Frequency Ordered Stop   08/17/14 1400  ertapenem [facility]) injection 1 g  Status:  Discontinued     1 g Intramuscular Every 24 hours 08/17/14 1309 08/17/14 1352   08/17/14 1400  ertapenem (INVANZ) 1 g in sodium chloride 0.9 % 50 mL IVPB     1 g 100 mL/hr over 30 Minutes Intravenous Every 24 hours 08/17/14 1353     08/16/14 1000  ceFEPIme (MAXIPIME) 2 g in dextrose 5 % 50 mL IVPB  Status:  Discontinued     2 g 100 mL/hr over 30 Minutes Intravenous Every 12 hours 08/16/14 0013 08/16/14 0841   08/16/14 1000  piperacillin-tazobactam (ZOSYN) IVPB 3.375 g  Status:  Discontinued     3.375 g 12.5 mL/hr over 240 Minutes Intravenous Every 8 hours 08/16/14 0852 08/16/14 1922   08/16/14 1000  metroNIDAZOLE (FLAGYL) IVPB 500 mg  Status:  Discontinued     500 mg 100 mL/hr over 60 Minutes Intravenous Every 8 hours 08/16/14 0923 08/16/14 1922   08/16/14 0915  tobramycin (NEBCIN) 510 mg in dextrose 5 % 100 mL IVPB  Status:  Discontinued     7 mg/kg  73 kg (Adjusted) 112.8 mL/hr over 60 Minutes Intravenous  Once 08/16/14 0914 08/16/14 0920   08/15/14 2215  ceFEPIme (MAXIPIME) 2 g in dextrose 5 % 50 mL IVPB     2 g 100 mL/hr over 30 Minutes Intravenous  Once 08/15/14 2210 08/15/14 2254      Medications: Scheduled Meds: . beta carotene w/minerals  2 tablet Oral Daily  . carbidopa-levodopa  1 tablet Oral QHS  . carbidopa-levodopa  3 tablet Oral TID  . cholecalciferol  2,000 Units Oral Daily  . DULoxetine  60 mg Oral BID  . enoxaparin (LOVENOX) injection  40 mg Subcutaneous Q24H  . ertapenem  1 g Intravenous Q24H  . multivitamin with minerals  1 tablet Oral Daily  . pregabalin  150 mg Oral BID  . Rivastigmine  13.3 mg Transdermal Daily  . zinc sulfate  220 mg Oral q1800   Continuous Infusions: . sodium chloride 50 mL/hr at  08/17/14 1540   PRN Meds:.acetaminophen **OR** acetaminophen, albuterol, alum & mag hydroxide-simeth, HYDROmorphone (DILAUDID) injection, ondansetron **OR** ondansetron (ZOFRAN) IV    Objective: Weight change: 6 lb 2.8 oz (2.8 kg)  Intake/Output Summary (Last 24 hours) at 08/17/14 1817 Last data filed at 08/17/14 1722  Gross per 24 hour  Intake   1290 ml  Output    901 ml  Net    389 ml   Blood pressure 143/77, pulse 98, temperature 98.7 F (37.1 C), temperature source Oral, resp. rate 19, height 5\' 7"  (1.702 m), weight 189 lb 13.1 oz (86.1 kg), SpO2 98 %. Temp:  [98.2 F (36.8 C)-99.9 F (37.7 C)] 98.7 F (37.1 C) (02/14 1721) Pulse Rate:  [98-108] 98 (02/14 1721) Resp:  [17-20] 19 (02/14 1721) BP: (100-147)/(57-82) 143/77 mmHg (02/14 1721) SpO2:  [89 %-98 %] 98 % (02/14 1721) Weight:  [189 lb 13.1 oz (86.1 kg)] 189 lb 13.1 oz (86.1 kg) (02/13 2003)  Physical Exam: General: Alert and awake, he cannot recall much of the specifics of why he came to the hospital HEENT: anicteric sclera, pupils reactive to light and accommodation, EOMI, oropharynx dry CVS regular rate, normal  r, no murmur rubs or gallops Chest: expiratory sounds more course  Abdomen: soft nontender, nondistended, normal bowel sounds,  Neuro: he has fairly rigid muscle tone and clonus with pushing on his feet.  Skin no clear ulcerations on feet or extremities No decubitus ulcers Neuro: nonfocal  CBC: CBC Latest Ref Rng 08/17/2014 08/16/2014 08/15/2014  WBC 4.0 - 10.5 K/uL 21.4(H) 22.4(H) 19.3(H)  Hemoglobin 13.0 - 17.0 g/dL 12.8(L) 14.1 14.8  Hematocrit 39.0 - 52.0 % 39.5 42.9 43.4  Platelets 150 - 400 K/uL 150 143(L) 162      BMET  Recent Labs  08/16/14 0511 08/17/14 0705  NA 138 137  K 5.2* 4.0  CL 104 103  CO2 26 25  GLUCOSE 89 127*  BUN 28* 26*  CREATININE 1.19 1.27  CALCIUM 8.7 8.6     Liver Panel   Recent Labs  08/17/14 0705  PROT 6.3  ALBUMIN 2.9*  AST 41*  ALT 10    ALKPHOS 129*  BILITOT 1.2       Sedimentation Rate No results for input(s): ESRSEDRATE in the last 72 hours. C-Reactive Protein No results for input(s): CRP in the last 72 hours.  Micro Results: No results found for this or any previous visit (from the past 720 hour(s)).  Studies/Results: Dg Chest 2 View  08/15/2014   CLINICAL DATA:  Dementia.  Urosepsis.  Weakness.  EXAM: CHEST  2 VIEW  COMPARISON:  05/22/14  FINDINGS: Low lung volumes are again noted. Both lungs remain clear. No evidence of pleural effusion. Left shoulder prosthesis is again demonstrated.  IMPRESSION: Stable low lung volumes.  No acute findings.   Electronically Signed   By: Earle Gell M.D.   On: 08/15/2014 20:48   Ct Chest W Contrast  08/16/2014   CLINICAL DATA:  Sepsis.  Bowel obstruction.  Colon carcinoma.  EXAM: CT CHEST, ABDOMEN, AND PELVIS WITH CONTRAST  TECHNIQUE: Multidetector CT imaging of the chest, abdomen and pelvis was performed following the standard protocol during bolus administration of intravenous contrast.  CONTRAST:  117mL OMNIPAQUE IOHEXOL 300 MG/ML  SOLN  COMPARISON:  Abdomen pelvis CT only on 05/20/2014  FINDINGS: CT CHEST FINDINGS  Mediastinum/Lymph Nodes: No masses or pathologically enlarged lymph nodes identified.  Lungs/Pleura: Atelectasis or infiltrate is seen in the lower lobes bilaterally. No evidence of pleural effusion.  Musculoskeletal/Soft Tissues: No suspicious bone lesions or other significant chest wall abnormality.  CT ABDOMEN AND PELVIS FINDINGS  Hepatobiliary: No masses or other significant abnormality identified. Gallbladder is unremarkable.  Pancreas: Diffuse pancreatic swelling is seen with mild peripancreatic inflammatory changes, consistent with acute pancreatitis. Ill-defined low-attenuation lesion at the pancreatic neck measures 1.9 x 2.1 cm on image 59/series 2, which is decreased in size from 2.4 x 2.8 cm previously. This could represent a pancreatic pseudocyst, although  pancreatic mass or peripancreatic lymphadenopathy cannot be excluded.  Spleen:  Within normal limits in size and appearance.  Adrenal Glands:  No mass identified.  Kidneys/Urinary Tract: No masses identified. No evidence of hydronephrosis.  Stomach/Bowel/Peritoneum: No evidence of wall thickening, mass, or obstruction. Minimal amount of free fluid noted in left lower quadrant mesentery, without associated bowel wall thickening.  Vascular/Lymphatic: No pathologically enlarged lymph nodes identified. No other significant abnormality identified.  Reproductive:  No mass or other significant abnormality identified.  Other:  None.  Musculoskeletal: No suspicious bone lesions identified. Previous lumbar spine fusion and severe right hip osteoarthritis again noted  IMPRESSION: Bilateral lower lobe atelectasis versus infiltrates.  Mild acute pancreatitis.  2.1 cm indeterminate low-attenuation lesion at the pancreatic neck has decreased in size from 2.8 cm previously. Differential diagnosis includes small pseudocyst, peripancreatic lymphadenopathy, or pancreatic neoplasm. Consider continued followup by CT versus abdomen MRI without and with contrast if patient can cooperate with breath holding.   Electronically Signed   By: Earle Gell M.D.   On: 08/16/2014 15:29   Ct Abdomen Pelvis W Contrast  08/16/2014   CLINICAL DATA:  Sepsis.  Bowel obstruction.  Colon carcinoma.  EXAM: CT CHEST, ABDOMEN, AND PELVIS WITH CONTRAST  TECHNIQUE: Multidetector CT imaging of the chest, abdomen and pelvis was performed following the standard protocol during bolus administration of intravenous contrast.  CONTRAST:  157mL OMNIPAQUE IOHEXOL 300 MG/ML  SOLN  COMPARISON:  Abdomen pelvis CT only on 05/20/2014  FINDINGS: CT CHEST FINDINGS  Mediastinum/Lymph Nodes: No masses or pathologically enlarged lymph nodes identified.  Lungs/Pleura: Atelectasis or infiltrate is seen in the lower lobes bilaterally. No evidence of pleural effusion.   Musculoskeletal/Soft Tissues: No suspicious bone lesions or other significant chest wall abnormality.  CT ABDOMEN AND PELVIS FINDINGS  Hepatobiliary: No masses or other significant abnormality identified. Gallbladder is unremarkable.  Pancreas: Diffuse pancreatic swelling is seen with mild peripancreatic inflammatory changes, consistent with acute pancreatitis. Ill-defined low-attenuation lesion at the pancreatic neck measures 1.9 x 2.1 cm on image 59/series 2, which is decreased in size from 2.4 x 2.8 cm previously. This could represent a pancreatic pseudocyst, although pancreatic mass or peripancreatic lymphadenopathy cannot be excluded.  Spleen:  Within normal limits in size and appearance.  Adrenal Glands:  No mass identified.  Kidneys/Urinary Tract: No masses identified. No evidence of hydronephrosis.  Stomach/Bowel/Peritoneum: No evidence of wall thickening, mass, or obstruction. Minimal amount of free fluid noted in left lower quadrant mesentery, without associated bowel wall thickening.  Vascular/Lymphatic: No pathologically enlarged lymph nodes identified. No other significant abnormality identified.  Reproductive:  No mass or other significant abnormality identified.  Other:  None.  Musculoskeletal: No suspicious bone lesions identified. Previous lumbar spine fusion and severe right hip osteoarthritis again noted  IMPRESSION: Bilateral lower lobe atelectasis versus infiltrates.  Mild acute pancreatitis.  2.1 cm indeterminate low-attenuation lesion at the pancreatic neck has decreased in size from 2.8 cm previously. Differential diagnosis includes small pseudocyst, peripancreatic lymphadenopathy, or pancreatic neoplasm. Consider continued followup by CT versus abdomen MRI without and with contrast if patient can cooperate with breath holding.   Electronically Signed   By: Earle Gell M.D.   On: 08/16/2014 15:29   Dg Chest Port 1 View  08/16/2014   CLINICAL DATA:  Shortness of breath.  Sepsis.  EXAM:  PORTABLE CHEST - 1 VIEW  COMPARISON:  08/15/2014  FINDINGS: Low lung volumes again noted. Increased opacity is seen in the left retrocardiac lung base which may be due to atelectasis or pneumonia. Right lung remains clear. Heart size is within normal limits.  IMPRESSION: Increased left retrocardiac atelectasis versus infiltrate.   Electronically Signed   By: Earle Gell M.D.   On: 08/16/2014 13:16      Assessment/Plan:  Principal Problem:   Sepsis Active Problems:   Parkinsonism   Dementia without behavioral disturbance   UTI (lower urinary tract infection)   Weakness   Leukocytosis   Acute encephalopathy   Blood poisoning   Enterobacter sepsis   Aspiration pneumonia   Atelectasis   Pancreatic lesion   Pancreatitis, acute    Paul Callahan is a 81 y.o. male with  With admission in November  for sepsis due to Enterobacter at which time he was found to have an area in his pancreas special's for neoplasm now admitted again with a septic picture after having been on antibiotics for "urinary tract infection." Prior to admission his wife had started the pt on amoxicillin which they have on hand to protect his prosthetic shoulder, then apparently had culture done at PCP that showed organism R to amox, pt changed to amox/clav, admitted with weakness and fever here. UA negative. CXR initial no fidings, on cefepime with fevers persisting. CT scan done yesterday with atelectasis vs infiltrates, cystic lesion in pancreas which is smaller. Today he is having more coughing, sounds worse on exam  #1 Sepsis fever unknown origin:  Potential culprits include his pancreatic cystic lesion which was apparently  Biopsied and cultured 2 months ago by [facility] (not malignant on path)  Recurrent aspiration due to inability to handle secretions  Hard for me to "buy" UTI as cause without pyuria.   I will restart antibiotics with Invanz which will cover anerobes, aspiration PNA more than amply and will  penetrate pancreas very well  If still fevering will examine his shoulder more closely   #2 Goals of care: I did not raise them but this 81 yo with Parkinsons, dementia, several admissions this year is with high mortality. He is listed as FULL CODE. I would encourage further discussion of goals of care and would encourage reassessment of what goals are here   LOS: 2 days   Alcide Evener 08/17/2014, 6:17 PM

## 2014-08-17 NOTE — Progress Notes (Signed)
[facility] PROGRESS NOTE  Paul Callahan WIO:[id number] DOB: 05/22/1924 DOA: 08/15/2014 PCP: [name], MD  Summary Appreciate ID. Paul Callahan is an 81 year old male with Parkinson's disease/dementia multiple drug allergies, among other medical problems, who came in with generalized weakness associated with shortness of breath and was found to be septic with white count of 19,000 despite outpatient antibiotics for close to a week(?Augmentin) given for UTI. Chest x-ray was unrevealing but this could be because patient is dry. There is a possibility that he may be aspirating. He was hospitalized in November 2015 and at that time blood cultures grew ENTEROBACTER AMNIGENUS. Urine culture/blood cultures so far unrevealing. White count increased to 22,000 despite Cefepime started last night. There is no report of diarrhea. CT chest abdomen and pelvis showed "Bilateral lower lobe atelectasis versus infiltrates. Mild acute pancreatitis. 2.1 cm indeterminate low-attenuation lesion at the pancreatic neck has decreased in size from 2.8 cm previously. Differential diagnosis includes small pseudocyst, peripancreatic lymphadenopathy, or pancreatic neoplasm. Consider continued followup by CT versus abdomen MRI without and with contrast if patient can cooperate with breath holding". Patient's white count remains at 21,000 and he has fever raising concern of untreated infection. He is now on Invanz with ID direction. Will consult speech therapy for swallow evaluation. Meanwhile, will continue IV fluids and antibiotics, add bronchodilators and incentive spirometry. Patient is more with it today but lethargic. Will consult physical therapy for ambulation. Will follow up with GI in a.m. regarding results of endoscopic ultrasound done in December 2015- [facility]. Plan Sepsis/Aspiration pneumonia/Atelectasis/UTI (lower urinary tract infection)/Leukocytosis  Follow septic workup  Bronchodilators/oxygen  supplementation as needed/incentive spirometry  Antibiotics per infectious diseases- on Invanz  Speech therapy evaluation Acute encephalopathy/Weakness/Dementia without behavioral disturbance/Parkinsonism  Continue home medications  Physical therapy evaluation Mild acute pancreatitis/pancreatic lesion  Check CA-19-9/lipase  Follow GI regarding results of endoscopic ultrasound  Code Status: Full Family Communication: Wife and daughter at bedside. Disposition Plan: To be determined.   Consultants:  ID  Procedures:    Antibiotics:  Invanz 08/17/14>  HPI/Subjective: "OK"  Objective: Filed Vitals:   08/17/14 1032  BP: 147/58  Pulse: 101  Temp: 98.6 F (37 C)  Resp: 19    Intake/Output Summary (Last 24 hours) at 08/17/14 1617 Last data filed at 08/17/14 0948  Gross per 24 hour  Intake   1170 ml  Output    500 ml  Net    670 ml   Filed Weights   08/15/14 2323 08/16/14 2003  Weight: 83.3 kg (183 lb 10.3 oz) 86.1 kg (189 lb 13.1 oz)    Exam:   General:  In mild respiratory distress. Coughing.  Cardiovascular: S1-S2 normal. No murmurs. Pulse regular.  Respiratory: Good air entry bilaterally. No rhonchi or rales. Bilateral transmitted sounds.  Abdomen: Soft and nontender. Normal bowel sounds. No organomegaly.  Musculoskeletal: No pedal edema   Neurological: Intact  Data Reviewed: Basic Metabolic Panel:  Recent Labs Lab 08/15/14 1948 08/16/14 0511 08/17/14 0705  NA 136 138 137  K 4.5 5.2* 4.0  CL 105 104 103  CO2 24 26 25   GLUCOSE 128* 89 127*  BUN 27* 28* 26*  CREATININE 1.09 1.19 1.27  CALCIUM 8.9 8.7 8.6  MG  --   --  2.1  PHOS  --   --  2.4   Liver Function Tests:  Recent Labs Lab 08/17/14 0705  AST 41*  ALT 10  ALKPHOS 129*  BILITOT 1.2  PROT 6.3  ALBUMIN 2.9*   No  results for input(s): LIPASE, AMYLASE in the last 168 hours. No results for input(s): AMMONIA in the last 168 hours. CBC:  Recent Labs Lab 08/15/14 1948  08/16/14 0511 08/17/14 0705  WBC 19.3* 22.4* 21.4*  NEUTROABS 17.7*  --  19.4*  HGB 14.8 14.1 12.8*  HCT 43.4 42.9 39.5  MCV 93.1 95.1 94.5  PLT 162 143* 150   Cardiac Enzymes: No results for input(s): CKTOTAL, CKMB, CKMBINDEX, TROPONINI in the last 168 hours. BNP (last 3 results) No results for input(s): BNP in the last 8760 hours.  ProBNP (last 3 results) No results for input(s): PROBNP in the last 8760 hours.  CBG:  Recent Labs Lab 08/16/14 2232  GLUCAP 134*    No results found for this or any previous visit (from the past 240 hour(s)).   Studies: Dg Chest 2 View  08/15/2014   CLINICAL DATA:  Dementia.  Urosepsis.  Weakness.  EXAM: CHEST  2 VIEW  COMPARISON:  05/22/14  FINDINGS: Low lung volumes are again noted. Both lungs remain clear. No evidence of pleural effusion. Left shoulder prosthesis is again demonstrated.  IMPRESSION: Stable low lung volumes.  No acute findings.   Electronically Signed   By: Earle Gell M.D.   On: 08/15/2014 20:48   Ct Chest W Contrast  08/16/2014   CLINICAL DATA:  Sepsis.  Bowel obstruction.  Colon carcinoma.  EXAM: CT CHEST, ABDOMEN, AND PELVIS WITH CONTRAST  TECHNIQUE: Multidetector CT imaging of the chest, abdomen and pelvis was performed following the standard protocol during bolus administration of intravenous contrast.  CONTRAST:  185mL OMNIPAQUE IOHEXOL 300 MG/ML  SOLN  COMPARISON:  Abdomen pelvis CT only on 05/20/2014  FINDINGS: CT CHEST FINDINGS  Mediastinum/Lymph Nodes: No masses or pathologically enlarged lymph nodes identified.  Lungs/Pleura: Atelectasis or infiltrate is seen in the lower lobes bilaterally. No evidence of pleural effusion.  Musculoskeletal/Soft Tissues: No suspicious bone lesions or other significant chest wall abnormality.  CT ABDOMEN AND PELVIS FINDINGS  Hepatobiliary: No masses or other significant abnormality identified. Gallbladder is unremarkable.  Pancreas: Diffuse pancreatic swelling is seen with mild peripancreatic  inflammatory changes, consistent with acute pancreatitis. Ill-defined low-attenuation lesion at the pancreatic neck measures 1.9 x 2.1 cm on image 59/series 2, which is decreased in size from 2.4 x 2.8 cm previously. This could represent a pancreatic pseudocyst, although pancreatic mass or peripancreatic lymphadenopathy cannot be excluded.  Spleen:  Within normal limits in size and appearance.  Adrenal Glands:  No mass identified.  Kidneys/Urinary Tract: No masses identified. No evidence of hydronephrosis.  Stomach/Bowel/Peritoneum: No evidence of wall thickening, mass, or obstruction. Minimal amount of free fluid noted in left lower quadrant mesentery, without associated bowel wall thickening.  Vascular/Lymphatic: No pathologically enlarged lymph nodes identified. No other significant abnormality identified.  Reproductive:  No mass or other significant abnormality identified.  Other:  None.  Musculoskeletal: No suspicious bone lesions identified. Previous lumbar spine fusion and severe right hip osteoarthritis again noted  IMPRESSION: Bilateral lower lobe atelectasis versus infiltrates.  Mild acute pancreatitis.  2.1 cm indeterminate low-attenuation lesion at the pancreatic neck has decreased in size from 2.8 cm previously. Differential diagnosis includes small pseudocyst, peripancreatic lymphadenopathy, or pancreatic neoplasm. Consider continued followup by CT versus abdomen MRI without and with contrast if patient can cooperate with breath holding.   Electronically Signed   By: Earle Gell M.D.   On: 08/16/2014 15:29   Ct Abdomen Pelvis W Contrast  08/16/2014   CLINICAL DATA:  Sepsis.  Bowel  obstruction.  Colon carcinoma.  EXAM: CT CHEST, ABDOMEN, AND PELVIS WITH CONTRAST  TECHNIQUE: Multidetector CT imaging of the chest, abdomen and pelvis was performed following the standard protocol during bolus administration of intravenous contrast.  CONTRAST:  154mL OMNIPAQUE IOHEXOL 300 MG/ML  SOLN  COMPARISON:  Abdomen  pelvis CT only on 05/20/2014  FINDINGS: CT CHEST FINDINGS  Mediastinum/Lymph Nodes: No masses or pathologically enlarged lymph nodes identified.  Lungs/Pleura: Atelectasis or infiltrate is seen in the lower lobes bilaterally. No evidence of pleural effusion.  Musculoskeletal/Soft Tissues: No suspicious bone lesions or other significant chest wall abnormality.  CT ABDOMEN AND PELVIS FINDINGS  Hepatobiliary: No masses or other significant abnormality identified. Gallbladder is unremarkable.  Pancreas: Diffuse pancreatic swelling is seen with mild peripancreatic inflammatory changes, consistent with acute pancreatitis. Ill-defined low-attenuation lesion at the pancreatic neck measures 1.9 x 2.1 cm on image 59/series 2, which is decreased in size from 2.4 x 2.8 cm previously. This could represent a pancreatic pseudocyst, although pancreatic mass or peripancreatic lymphadenopathy cannot be excluded.  Spleen:  Within normal limits in size and appearance.  Adrenal Glands:  No mass identified.  Kidneys/Urinary Tract: No masses identified. No evidence of hydronephrosis.  Stomach/Bowel/Peritoneum: No evidence of wall thickening, mass, or obstruction. Minimal amount of free fluid noted in left lower quadrant mesentery, without associated bowel wall thickening.  Vascular/Lymphatic: No pathologically enlarged lymph nodes identified. No other significant abnormality identified.  Reproductive:  No mass or other significant abnormality identified.  Other:  None.  Musculoskeletal: No suspicious bone lesions identified. Previous lumbar spine fusion and severe right hip osteoarthritis again noted  IMPRESSION: Bilateral lower lobe atelectasis versus infiltrates.  Mild acute pancreatitis.  2.1 cm indeterminate low-attenuation lesion at the pancreatic neck has decreased in size from 2.8 cm previously. Differential diagnosis includes small pseudocyst, peripancreatic lymphadenopathy, or pancreatic neoplasm. Consider continued followup by  CT versus abdomen MRI without and with contrast if patient can cooperate with breath holding.   Electronically Signed   By: Earle Gell M.D.   On: 08/16/2014 15:29   Dg Chest Port 1 View  08/16/2014   CLINICAL DATA:  Shortness of breath.  Sepsis.  EXAM: PORTABLE CHEST - 1 VIEW  COMPARISON:  08/15/2014  FINDINGS: Low lung volumes again noted. Increased opacity is seen in the left retrocardiac lung base which may be due to atelectasis or pneumonia. Right lung remains clear. Heart size is within normal limits.  IMPRESSION: Increased left retrocardiac atelectasis versus infiltrate.   Electronically Signed   By: Earle Gell M.D.   On: 08/16/2014 13:16    Scheduled Meds: . beta carotene w/minerals  2 tablet Oral Daily  . carbidopa-levodopa  1 tablet Oral QHS  . carbidopa-levodopa  3 tablet Oral TID  . cholecalciferol  2,000 Units Oral Daily  . DULoxetine  60 mg Oral BID  . enoxaparin (LOVENOX) injection  40 mg Subcutaneous Q24H  . ertapenem  1 g Intravenous Q24H  . multivitamin with minerals  1 tablet Oral Daily  . pregabalin  150 mg Oral BID  . Rivastigmine  13.3 mg Transdermal Daily  . zinc sulfate  220 mg Oral q1800   Continuous Infusions: . sodium chloride 50 mL/hr at 08/17/14 1540     Time spent: 25 minutes    Claris Pech  [facility] Pager [phone]. If 7PM-7AM, please contact night-coverage at [email or web address], password [facility] 08/17/2014, 4:17 PM  LOS: 2 days

## 2014-08-18 ENCOUNTER — Inpatient Hospital Stay (HOSPITAL_COMMUNITY): Payer: Medicare Other

## 2014-08-18 DIAGNOSIS — I10 Essential (primary) hypertension: Secondary | ICD-10-CM | POA: Diagnosis present

## 2014-08-18 DIAGNOSIS — R1310 Dysphagia, unspecified: Secondary | ICD-10-CM

## 2014-08-18 DIAGNOSIS — R627 Adult failure to thrive: Secondary | ICD-10-CM | POA: Insufficient documentation

## 2014-08-18 LAB — COMPREHENSIVE METABOLIC PANEL
ALT: 6 U/L (ref 0–53)
ANION GAP: 7 (ref 5–15)
AST: 25 U/L (ref 0–37)
Albumin: 2.6 g/dL — ABNORMAL LOW (ref 3.5–5.2)
Alkaline Phosphatase: 132 U/L — ABNORMAL HIGH (ref 39–117)
BILIRUBIN TOTAL: 0.9 mg/dL (ref 0.3–1.2)
BUN: 20 mg/dL (ref 6–23)
CALCIUM: 8.1 mg/dL — AB (ref 8.4–10.5)
CHLORIDE: 108 mmol/L (ref 96–112)
CO2: 23 mmol/L (ref 19–32)
Creatinine, Ser: 0.99 mg/dL (ref 0.50–1.35)
GFR calc Af Amer: 82 mL/min — ABNORMAL LOW (ref >90)
GFR, EST NON AFRICAN AMERICAN: 71 mL/min — AB (ref >90)
Glucose, Bld: 137 mg/dL — ABNORMAL HIGH (ref 70–99)
POTASSIUM: 3.9 mmol/L (ref 3.5–5.1)
SODIUM: 138 mmol/L (ref 135–145)
Total Protein: 6.1 g/dL (ref 6.0–8.3)

## 2014-08-18 LAB — CBC WITH DIFFERENTIAL/PLATELET
BASOS ABS: 0 10*3/uL (ref 0.0–0.1)
Basophils Relative: 0 % (ref 0–1)
Eosinophils Absolute: 0 10*3/uL (ref 0.0–0.7)
Eosinophils Relative: 0 % (ref 0–5)
HEMATOCRIT: 37.7 % — AB (ref 39.0–52.0)
Hemoglobin: 12.4 g/dL — ABNORMAL LOW (ref 13.0–17.0)
LYMPHS PCT: 4 % — AB (ref 12–46)
Lymphs Abs: 0.6 10*3/uL — ABNORMAL LOW (ref 0.7–4.0)
MCH: 30.9 pg (ref 26.0–34.0)
MCHC: 32.9 g/dL (ref 30.0–36.0)
MCV: 94 fL (ref 78.0–100.0)
Monocytes Absolute: 0.8 10*3/uL (ref 0.1–1.0)
Monocytes Relative: 5 % (ref 3–12)
NEUTROS ABS: 14 10*3/uL — AB (ref 1.7–7.7)
NEUTROS PCT: 91 % — AB (ref 43–77)
Platelets: 132 10*3/uL — ABNORMAL LOW (ref 150–400)
RBC: 4.01 MIL/uL — ABNORMAL LOW (ref 4.22–5.81)
RDW: 14.5 % (ref 11.5–15.5)
WBC: 15.4 10*3/uL — AB (ref 4.0–10.5)

## 2014-08-18 LAB — HEPATITIS C ANTIBODY (REFLEX): HCV Ab: NEGATIVE

## 2014-08-18 LAB — HIV ANTIBODY (ROUTINE TESTING W REFLEX): HIV Screen 4th Generation wRfx: NONREACTIVE

## 2014-08-18 MED ORDER — AMLODIPINE BESYLATE 5 MG PO TABS
5.0000 mg | ORAL_TABLET | Freq: Every day | ORAL | Status: DC
Start: 2014-08-18 — End: 2014-08-22
  Administered 2014-08-18 – 2014-08-22 (×4): 5 mg via ORAL
  Filled 2014-08-18 (×6): qty 1

## 2014-08-18 MED ORDER — HYDRALAZINE HCL 20 MG/ML IJ SOLN
10.0000 mg | Freq: Four times a day (QID) | INTRAMUSCULAR | Status: DC | PRN
  Administered 2014-08-18: 10 mg via INTRAVENOUS
  Filled 2014-08-18: qty 1

## 2014-08-18 NOTE — Evaluation (Signed)
Clinical/Bedside Swallow Evaluation Patient Details  Name: Paul Callahan MRN: [id number] Date of Birth: 12-23-1923  Today's Date: 08/18/2014 Time: SLP Start Time (ACUTE ONLY): 1114 SLP Stop Time (ACUTE ONLY): 1133 SLP Time Calculation (min) (ACUTE ONLY): 19 min  Past Medical History:  Past Medical History  Diagnosis Date  . Parkinson disease     'tremors are worsened at this time than before"  . Dementia   . Depression   . Bowel obstruction 12/15/2011  . Neuromuscular disorder     hx of parkinsons  . Arthritis   . Sepsis 05/20/2014  . Cancer     hx of colon cancer(surgery only)  . Impaired mobility     since hospital visit 11'15 overall weakness of legs -ambulates with walker" with assist   Past Surgical History:  Past Surgical History  Procedure Laterality Date  . Cataracts Bilateral     '94,'95  . Hernia repair    . Back surgery      x 5  . Frozen shoulder    . Vasectomy    . Total shoulder arthroplasty Left   . Lumbar fusion    . Spinal cord stimulator implant    . Colon surgery      '88 colon resection(cancer)  . Eye surgery      strabismus  repair '58, laser surgery also after cataract surgery  . Eus N/A 06/18/2014    Procedure: ESOPHAGEAL ENDOSCOPIC ULTRASOUND (EUS) RADIAL;  Surgeon: Arta Silence, MD;  Location: [facility];  Service: Endoscopy;  Laterality: N/A;   HPI:  Pt is an 81 y.o. male who was brought to the ED due to complaints of ABD pain since last night along with weakness and increased somnolence and fevers today.He had been placed on Amoxicillin initially for a UTI, which was changed to Augmentin/Clavulinic Acid 500 mg BID 1 days ago for a UT when his culture results returned.He had been having Urinary Frequency and urgency for the past 2-3 weeks and was seen by his PCP and started on an antibiotic Rx.   Assessment / Plan / Recommendation Clinical Impression  Pt has wet vocal quality and coughing that accompanies even small sips of thin liquids.  Cough is very weak despite cueing from SLP for increased force, and would likely not be effective at clearing suspected penetrates/aspirates. Orally, pt has lingual trembling and generally reduced ROM, making bolus manipulation labored. Recommend Dys 3 diet and nectar thick liquids with full supervision. Will continue to follow.    Aspiration Risk  Moderate    Diet Recommendation Dysphagia 3 (Mechanical Soft);Nectar-thick liquid   Liquid Administration via: Cup;Straw Medication Administration: Whole meds with puree Supervision: Staff to assist with self feeding;Full supervision/cueing for compensatory strategies Compensations: Slow rate;Small sips/bites Postural Changes and/or Swallow Maneuvers: Seated upright 90 degrees    Other  Recommendations Oral Care Recommendations: Oral care BID Other Recommendations: Order thickener from pharmacy;Prohibited food (jello, ice cream, thin soups);Remove water pitcher   Follow Up Recommendations  Skilled Nursing facility    Frequency and Duration min 2x/week  2 weeks   Pertinent Vitals/Pain n/a    SLP Swallow Goals     Swallow Study Prior Functional Status  Type of Home: House Available Help at Discharge: Family;Personal care attendant;Available 24 hours/day    General HPI: Pt is an 81 y.o. male who was brought to the ED due to complaints of ABD pain since last night along with weakness and increased somnolence and fevers today.He had been placed on Amoxicillin initially  for a UTI, which was changed to Augmentin/Clavulinic Acid 500 mg BID 1 days ago for a UT when his culture results returned.He had been having Urinary Frequency and urgency for the past 2-3 weeks and was seen by his PCP and started on an antibiotic Rx. Type of Study: Bedside swallow evaluation Previous Swallow Assessment: BSE 05/2014 recommending regular diet and thin liquids without f/u Diet Prior to this Study: Regular;Thin liquids Temperature Spikes Noted:  Yes Respiratory Status: Room air History of Recent Intubation: No Behavior/Cognition: Alert;Cooperative;Pleasant mood;Confused;Requires cueing Oral Cavity - Dentition: Adequate natural dentition Self-Feeding Abilities: Needs assist Patient Positioning: Upright in chair Baseline Vocal Quality: Low vocal intensity Volitional Cough: Cognitively unable to elicit Volitional Swallow: Unable to elicit    Oral/Motor/Sensory Function Overall Oral Motor/Sensory Function: Impaired (lingual tremor, generalized reduced ROM)   Ice Chips Ice chips: Not tested   Thin Liquid Thin Liquid: Impaired Presentation: Straw Pharyngeal  Phase Impairments: Suspected delayed Swallow;Wet Vocal Quality;Cough - Immediate;Cough - Delayed    Nectar Thick Nectar Thick Liquid: Impaired Presentation: Straw Pharyngeal Phase Impairments: Suspected delayed Swallow   Honey Thick Honey Thick Liquid: Not tested   Puree Puree: Impaired Presentation: Spoon Pharyngeal Phase Impairments: Suspected delayed Swallow   Solid   GO    Solid: Impaired Oral Phase Impairments: Impaired mastication       Paul Callahan, M.A. CCC-SLP [phone]  Paul Callahan 08/18/2014,11:51 AM

## 2014-08-18 NOTE — Evaluation (Signed)
Physical Therapy Evaluation Patient Details Name: Paul Callahan MRN: [id number] DOB: 1924/01/01 Today's Date: 08/18/2014   History of Present Illness  Pt is an 81 y.o. male who was brought to the ED due to complaints of ABD pain since last night along with weakness and increased somnolence and fevers today.He had been placed on Amoxicillin initially for a UTI, which was changed to Augmentin/Clavulinic Acid 500 mg BID 1 days ago for a UT when his culture results returned.He had been having Urinary Frequency and urgency for the past 2-3 weeks and was seen by his PCP and started on an antibiotic Rx.  Clinical Impression  Pt admitted with above diagnosis. Pt currently with functional limitations due to the deficits listed below (see PT Problem List). At the time of PT eval pt was able to perform transfers with +2 assist. Had telephone conversation with wife after session who states that PTA pt was able to self-feed only and required assist for all other ADL's and supervision for ambulation around the home with RW. Wife agreeable to short term rehab in the SNF setting at d/c. Pt will benefit from skilled PT to increase their independence and safety with mobility to allow discharge to the venue listed below.       Follow Up Recommendations SNF;Supervision/Assistance - 24 hour    Equipment Recommendations  None recommended by PT    Recommendations for Other Services       Precautions / Restrictions Precautions Precautions: Fall Restrictions Weight Bearing Restrictions: No      Mobility  Bed Mobility Overal bed mobility: Needs Assistance;+2 for physical assistance Bed Mobility: Supine to Sit     Supine to sit: Max assist;+2 for physical assistance     General bed mobility comments: Assist to elevate trunk to full sitting position and scoot out to EOB. Assist initially to maintain seated balance, however was able to hold himself up with UE's when hands were placed out to the side to  increase BOS.   Transfers Overall transfer level: Needs assistance Equipment used: Rolling walker (2 wheeled) Transfers: Sit to/from [facility] Sit to Stand: Mod assist;+2 physical assistance Stand pivot transfers: Mod assist;+2 physical assistance       General transfer comment: Pt required +2 assist to power-up to full standing position. Pt was cued for improved posture however pt was unable to make corrective changes. Pt required both verbal and tactile cueing to take pivotal steps around to the recliner chair.   Ambulation/Gait             General Gait Details: Deferred due to increased assist required for SPT to chair.   Stairs            Wheelchair Mobility    Modified Rankin (Stroke Patients Only)       Balance                                             Pertinent Vitals/Pain Pain Assessment: Faces Faces Pain Scale: No hurt    Home Living Family/patient expects to be discharged to:: Private residence Living Arrangements: Spouse/significant other Available Help at Discharge: Family;Personal care attendant;Available 24 hours/day Type of Home: House Home Access: Ramped entrance     Home Layout: Two level;Able to live on main level with bedroom/bathroom Home Equipment: Gilford Rile - 2 wheels;Wheelchair - [employer];Shower seat - built in  Prior Function Level of Independence: Needs assistance   Gait / Transfers Assistance Needed: Pt's wife states that he is able to ambulate around the home with RW and supervision  ADL's / Homemaking Assistance Needed: Wife states that pt is able to self-feed, however requires assist for all other ADL's. Uses shower seat for bathing (mod A) and total assist for dressing.   Comments: Pt has a Physiological scientist has done CIR and OPPT neuro before. Wife 24hrs and aide 5 days/wk     Hand Dominance   Dominant Hand: Right    Extremity/Trunk Assessment   Upper Extremity  Assessment: Defer to OT evaluation           Lower Extremity Assessment: Generalized weakness      Cervical / Trunk Assessment: Kyphotic  Communication   Communication: Expressive difficulties  Cognition Arousal/Alertness: Awake/alert Behavior During Therapy: Flat affect Overall Cognitive Status: History of cognitive impairments - at baseline                      General Comments      Exercises        Assessment/Plan    PT Assessment Patient needs continued PT services  PT Diagnosis Difficulty walking;Generalized weakness   PT Problem List Decreased strength;Decreased range of motion;Decreased activity tolerance;Decreased balance;Decreased mobility;Decreased knowledge of use of DME;Decreased safety awareness;Decreased knowledge of precautions  PT Treatment Interventions DME instruction;Gait training;Stair training;Functional mobility training;Therapeutic activities;Therapeutic exercise;Neuromuscular re-education;Patient/family education   PT Goals (Current goals can be found in the Care Plan section) Acute Rehab PT Goals Patient Stated Goal: Pt was unable to state goals at this time.  PT Goal Formulation: With family Time For Goal Achievement: 09/01/14 Potential to Achieve Goals: Fair    Frequency Min 2X/week   Barriers to discharge Decreased caregiver support Per pt's wife, she will not be able to manage his care at this point due to her own physical limitations (2 herniated discs in her back).     Co-evaluation               End of Session Equipment Utilized During Treatment: Gait belt;Oxygen Activity Tolerance: Patient limited by fatigue Patient left: in chair;with chair alarm set;with call bell/phone within reach Nurse Communication: Mobility status;Need for lift equipment (Stedy recommended for back to bed)         Time: 4259-5638 PT Time Calculation (min) (ACUTE ONLY): 20 min   Charges:   PT Evaluation $Initial PT Evaluation Tier I: 1  Procedure     PT G Codes:        Rolinda Roan 08-20-14, 11:31 AM  Rolinda Roan, PT, DPT Acute Rehabilitation Services Pager: [phone]

## 2014-08-18 NOTE — Progress Notes (Signed)
Dr. Sanjuana Letters notified of fine crackles in lower lobes, rhonchi in upper lobes, pt c/o of SOB. VSS, O2 saturation 96% 2L [place]. New order to d/c IVF. Will continue to monitor closely.

## 2014-08-18 NOTE — Clinical Social Work Placement (Addendum)
Clinical Social Work Department CLINICAL SOCIAL WORK PLACEMENT NOTE 08/18/2014  Patient:  Paul Callahan, Paul Callahan  Account Number:  [id number] Admit date:  08/15/2014  Clinical Social Worker:  Karin Pinedo Givens, LCSW  Date/time:  08/18/2014 12:00 M  Clinical Social Work is seeking post-discharge placement for this patient at the following level of care:   SKILLED NURSING   (*CSW will update this form in Epic as items are completed)     Patient/family provided with [facility] Department of Clinical Social Work's list of facilities offering this level of care within the geographic area requested by the patient (or if unable, by the patient's family).  08/18/2014  Patient/family informed of their freedom to choose among providers that offer the needed level of care, that participate in Medicare, Medicaid or managed care program needed by the patient, have an available bed and are willing to accept the patient.    Patient/family informed of [facility]' ownership interest in [facility], as well as of the fact that they are under no obligation to receive care at this facility.  PASARR submitted to EDS on 09/13/2007 PASARR number received on 09/13/2007  FL2 transmitted to all facilities in geographic area requested by pt/family on  08/18/2014 FL2 transmitted to all facilities within larger geographic area on   Patient informed that his/her managed care company has contracts with or will negotiate with  certain facilities, including the following:     Patient/family informed of bed offers received: 08/18/14  Patient chooses bed at #1-[facility] and #2-[facility]  Physician recommends and patient chooses bed at    Patient to be transferred to on   Patient to be transferred to facility by  Patient and family notified of transfer on  Name of family member notified:    The following physician request were entered in Epic:   Additional Comments: 08/18/14 - Facility preferences include:  [facility], [facility] and [place]. 08/18/14 - 4:25 pm: Talked with Claiborne Billings in admissions at [facility] regarding patient. She will pull patient's information and wait to hear from [facility] when patient ready for d/c.     Thom Ollinger Givens, MSW, LCSW Licensed Clinical Social Worker [place] [phone]

## 2014-08-18 NOTE — Progress Notes (Signed)
[facility] for Infectious Disease         Subjective: Much less conversant today but family not here   Antibiotics:  Anti-infectives    Start     Dose/Rate Route Frequency Ordered Stop   08/17/14 1400  ertapenem [facility]) injection 1 g  Status:  Discontinued     1 g Intramuscular Every 24 hours 08/17/14 1309 08/17/14 1352   08/17/14 1400  ertapenem (INVANZ) 1 g in sodium chloride 0.9 % 50 mL IVPB     1 g 100 mL/hr over 30 Minutes Intravenous Every 24 hours 08/17/14 1353     08/16/14 1000  ceFEPIme (MAXIPIME) 2 g in dextrose 5 % 50 mL IVPB  Status:  Discontinued     2 g 100 mL/hr over 30 Minutes Intravenous Every 12 hours 08/16/14 0013 08/16/14 0841   08/16/14 1000  piperacillin-tazobactam (ZOSYN) IVPB 3.375 g  Status:  Discontinued     3.375 g 12.5 mL/hr over 240 Minutes Intravenous Every 8 hours 08/16/14 0852 08/16/14 1922   08/16/14 1000  metroNIDAZOLE (FLAGYL) IVPB 500 mg  Status:  Discontinued     500 mg 100 mL/hr over 60 Minutes Intravenous Every 8 hours 08/16/14 0923 08/16/14 1922   08/16/14 0915  tobramycin (NEBCIN) 510 mg in dextrose 5 % 100 mL IVPB  Status:  Discontinued     7 mg/kg  73 kg (Adjusted) 112.8 mL/hr over 60 Minutes Intravenous  Once 08/16/14 0914 08/16/14 0920   08/15/14 2215  ceFEPIme (MAXIPIME) 2 g in dextrose 5 % 50 mL IVPB     2 g 100 mL/hr over 30 Minutes Intravenous  Once 08/15/14 2210 08/15/14 2254      Medications: Scheduled Meds: . beta carotene w/minerals  2 tablet Oral Daily  . carbidopa-levodopa  1 tablet Oral QHS  . carbidopa-levodopa  3 tablet Oral TID  . cholecalciferol  2,000 Units Oral Daily  . DULoxetine  60 mg Oral BID  . enoxaparin (LOVENOX) injection  40 mg Subcutaneous Q24H  . ertapenem  1 g Intravenous Q24H  . multivitamin with minerals  1 tablet Oral Daily  . pregabalin  150 mg Oral BID  . Rivastigmine  13.3 mg Transdermal Daily  . zinc sulfate  220 mg Oral q1800   Continuous Infusions: . sodium chloride 50  mL/hr at 08/17/14 2304   PRN Meds:.acetaminophen **OR** acetaminophen, albuterol, alum & mag hydroxide-simeth, hydrALAZINE, HYDROmorphone (DILAUDID) injection, ondansetron **OR** ondansetron (ZOFRAN) IV    Objective: Weight change: -1 lb 5.2 oz (-0.6 kg)  Intake/Output Summary (Last 24 hours) at 08/18/14 1444 Last data filed at 08/18/14 1314  Gross per 24 hour  Intake   1010 ml  Output    901 ml  Net    109 ml   Blood pressure 176/81, pulse 91, temperature 100.1 F (37.8 C), temperature source Oral, resp. rate 18, height 5\' 7"  (1.702 m), weight 188 lb 7.9 oz (85.5 kg), SpO2 97 %. Temp:  [97.7 F (36.5 C)-101.1 F (38.4 C)] 100.1 F (37.8 C) (02/15 1140) Pulse Rate:  [88-98] 91 (02/15 1140) Resp:  [18-20] 18 (02/15 1000) BP: (130-176)/(56-82) 176/81 mmHg (02/15 1140) SpO2:  [95 %-98 %] 97 % (02/15 1140) Weight:  [188 lb 7.9 oz (85.5 kg)] 188 lb 7.9 oz (85.5 kg) (02/14 2051)  Physical Exam: General: Alert and awake, he cannot recall much of the specifics of why he came to the hospital HEENT: anicteric sclera, pupils reactive to light and accommodation, EOMI, oropharynx dry CVS  regular rate, normal r, no murmur rubs or gallops Chest: expiratory sounds more course  Abdomen: soft nontender, nondistended, normal bowel sounds,  Neuro: he has fairly rigid muscle tone and clonus with pushing on his feet.  Skin no clear ulcerations on feet or extremities Neuro: nonfocal  CBC: CBC Latest Ref Rng 08/18/2014 08/17/2014 08/16/2014  WBC 4.0 - 10.5 K/uL 15.4(H) 21.4(H) 22.4(H)  Hemoglobin 13.0 - 17.0 g/dL 12.4(L) 12.8(L) 14.1  Hematocrit 39.0 - 52.0 % 37.7(L) 39.5 42.9  Platelets 150 - 400 K/uL 132(L) 150 143(L)      BMET  Recent Labs  08/17/14 0705 08/18/14 0719  NA 137 138  K 4.0 3.9  CL 103 108  CO2 25 23  GLUCOSE 127* 137*  BUN 26* 20  CREATININE 1.27 0.99  CALCIUM 8.6 8.1*     Liver Panel   Recent Labs  08/17/14 0705 08/18/14 0719  PROT 6.3 6.1  ALBUMIN  2.9* 2.6*  AST 41* 25  ALT 10 6  ALKPHOS 129* 132*  BILITOT 1.2 0.9       Sedimentation Rate No results for input(s): ESRSEDRATE in the last 72 hours. C-Reactive Protein No results for input(s): CRP in the last 72 hours.  Micro Results: Recent Results (from the past 720 hour(s))  Culture, blood (routine x 2)     Status: None (Preliminary result)   Collection Time: 08/15/14  7:40 PM  Result Value Ref Range Status   Specimen Description BLOOD ARM RIGHT  Final   Special Requests BOTTLES DRAWN AEROBIC AND ANAEROBIC 5CC  Final   Culture   Final           BLOOD CULTURE RECEIVED NO GROWTH TO DATE CULTURE WILL BE HELD FOR 5 DAYS BEFORE ISSUING A FINAL NEGATIVE REPORT Performed at [facility]    Report Status PENDING  Incomplete  Culture, blood (routine x 2)     Status: None (Preliminary result)   Collection Time: 08/15/14  7:45 PM  Result Value Ref Range Status   Specimen Description BLOOD HAND RIGHT  Final   Special Requests BOTTLES DRAWN AEROBIC ONLY 3CC  Final   Culture   Final           BLOOD CULTURE RECEIVED NO GROWTH TO DATE CULTURE WILL BE HELD FOR 5 DAYS BEFORE ISSUING A FINAL NEGATIVE REPORT Performed at [facility]    Report Status PENDING  Incomplete  Urine culture     Status: None   Collection Time: 08/15/14  8:45 PM  Result Value Ref Range Status   Specimen Description URINE, CATHETERIZED  Final   Special Requests NONE  Final   [place]ony Count NO GROWTH Performed at [facility]   Final   Culture NO GROWTH Performed at [facility]   Final   Report Status 08/17/2014 FINAL  Final    Studies/Results: Dg Chest Port 1 View  08/18/2014   CLINICAL DATA:  Colon cancer.  Hypoxia.  EXAM: PORTABLE CHEST - 1 VIEW  COMPARISON:  CT chest 08/16/2014.  FINDINGS: The heart size is exaggerated by low lung volumes. Mild pulmonary vascular congestion is again noted. Bilateral pleural effusions are evident. Mild bibasilar airspace disease is again  noted the worse on the left. The right hemidiaphragm is elevated without change.  IMPRESSION: 1. Stable low volumes. 2. Small bilateral pleural effusions. 3. Persistent bibasilar airspace disease and. Given the effusions, this likely reflects atelectasis but infection is also considered.   Electronically Signed   By: Harrell Gave  Mattern M.D.   On: 08/18/2014 09:18      Assessment/Plan:  Principal Problem:   Sepsis Active Problems:   Parkinsonism   Dementia without behavioral disturbance   UTI (lower urinary tract infection)   Weakness   Leukocytosis   Acute encephalopathy   Blood poisoning   Enterobacter sepsis   Aspiration pneumonia   Atelectasis   Pancreatic lesion   Pancreatitis, acute   Dementia with behavioral disturbance   Dysphagia    Paul Callahan is a 81 y.o. male with  With admission in November for sepsis due to Enterobacter at which time he was found to have an area in his pancreas special's for neoplasm now admitted again with a septic picture after having been on antibiotics for "urinary tract infection." Prior to admission his wife had started the pt on amoxicillin which they have on hand to protect his prosthetic shoulder, then apparently had culture done at PCP that showed organism R to amox, pt changed to amox/clav, admitted with weakness and fever here. UA negative. CXR initial no fidings, on cefepime with fevers persisting. CT scan done with atelectasis vs infiltrates, cystic lesion in pancreas which is smaller. Yesterday he is having more coughing, and  soundsed worse on exam. I put him back on abx in form of invanz. His CXR portable shows pleural effusions  #1 Sepsis fever unknown origin:  A) Potential culprits include his pancreatic cystic lesion which was apparently  Biopsied and cultured 2 months ago by [facility] (not malignant on path)  B) Recurrent aspiration due to inability to handle secretions  C) Pleural effusions  Hard for me to "buy" UTI as cause  without pyuria.   Continue INVANZ for now  Could consider IR or pulmonary consult to form diagnostic and therapeutic thoracocentesis.  I personally am not in favor of excessive aggressive approach to this patient.   #2 Goals of care: I think this 81 year old man with dementia and parkinsonism , pancreatic neoplasm, aspiration  now worsening weakness and protracted hospital stay is not going to survive long and I would STRONGLY recommend Palliative care consult as at this point I do not see him have dramatic improvements in his quality of life. Rather I'm concerned that the patient may be subjected to a necessary suffering through aggressive medical care.  Dr. Megan Salon take over the service tomorrow.  LOS: 3 days   Alcide Evener 08/18/2014, 2:44 PM

## 2014-08-18 NOTE — Clinical Social Work Psychosocial (Signed)
Clinical Social Work Department BRIEF PSYCHOSOCIAL ASSESSMENT 08/18/2014  Patient:  Paul Callahan, Paul Callahan     Account Number:  [id number]     Admit date:  08/15/2014  Clinical Social Worker:  Frederico Hamman  Date/Time:  08/18/2014 12:16 PM  Referred by:  Physician  Date Referred:  08/18/2014 Referred for  SNF Placement   Other Referral:   Interview type:  Family Other interview type:    PSYCHOSOCIAL DATA Living Status:  WIFE Admitted from facility:   Level of care:   Primary support name:  Paul Callahan Primary support relationship to patient:  SPOUSE Degree of support available:   Wife very involved with patient's care and decision making    CURRENT CONCERNS Current Concerns  Post-Acute Placement   Other Concerns:    SOCIAL WORK ASSESSMENT / PLAN CSW talked with physical therapist Mickel Baas after her evaluation of patient. She is recommending ST rehab and spoke with the wife who provided her with baseline history of patient and is in agreement with rehab.    CSW contacted Paul Callahan and spoke with her about patient. She matter-of-factly informed CSW regarding patient history. Paul Callahan currently has a caregiver for 4 hours a day, 5 days a week, gradually down from 24/7 care after a previous hospital stay. Wife realizes that due to her own health issues she cannot care for patient at the level he will need post discharge.  CSW provided with facility preferences, including: [facility] and [place], [facility] and [facility].   Assessment/plan status:  Psychosocial Support/Ongoing Assessment of Needs Other assessment/ plan:   Information/referral to community resources:   List offered however wife provided CSW with facility preferences.    PATIENT'S/FAMILY'S RESPONSE TO PLAN OF CARE: Wife able to talk with CSW about patient's history and his current medical needs. She is very realistic regarding her ability to proivde care when the paid caregiver is not at the home.        Paul Callahan, MSW, LCSW Licensed Clinical Social Worker [place] [phone]

## 2014-08-18 NOTE — Progress Notes (Signed)
[facility] PROGRESS NOTE  Paul Callahan EHU:[id number] DOB: 04-10-1924 DOA: 08/15/2014 PCP: [name], MD  Summary Appreciate ID. Paul Callahan is an 81 year old male with Parkinson's disease/dementia multiple drug allergies, among other medical problems, who came in with generalized weakness associated with shortness of breath and was found to be septic with white count of 19,000 despite outpatient antibiotics for close to a week(?Augmentin) given for UTI. Chest x-ray was unrevealing but this could be because patient was dry at admission. There is element of aspiration suggested by swallow evaluation done today-Appreciate SLP. Patient was hospitalized in November 2015 and at that time blood cultures grew ENTEROBACTER AMNIGENUS. Urine culture/blood cultures so far unrevealing. [place] improved to 15400 on Invanz. CT chest abdomen and pelvis showed "Bilateral lower lobe atelectasis versus infiltrates. Mild acute pancreatitis. 2.1 cm indeterminate low-attenuation lesion at the pancreatic neck has decreased in size from 2.8 cm previously. Differential diagnosis includes small pseudocyst, peripancreatic lymphadenopathy, or pancreatic neoplasm. Consider continued followup by CT versus abdomen MRI without and with contrast if patient can cooperate with breath holding". I spoke with [name] regarding EUS results-benign. Patient will need short-term. He should be on dysphagia diet per speech therapy recommendations. He feels better today. He is sitting in chair, and is comfortable. Plan Sepsis/Aspiration pneumonia/Atelectasis/UTI (lower urinary tract infection)/Leukocytosis  Follow septic workup  Bronchodilators/oxygen supplementation as needed/incentive spirometry  Antibiotics per infectious diseases- on Invanz  Follow Speech therapy recommendations Acute encephalopathy/Weakness/Dementia without behavioral disturbance/Parkinsonism  Continue home medications  Physical therapy   Mild acute pancreatitis/pancreatic lesion  No acute changes Essential hypertension  Norvasc  Code Status: Full Family Communication: No family at bedside. Disposition Plan: SNF.   Consultants:  ID  Procedures:    Antibiotics:  Invanz 08/17/14>  HPI/Subjective: "OK"  Objective: Filed Vitals:   08/18/14 1140  BP: 176/81  Pulse: 91  Temp: 100.1 F (37.8 C)  Resp:     Intake/Output Summary (Last 24 hours) at 08/18/14 1442 Last data filed at 08/18/14 1314  Gross per 24 hour  Intake   1010 ml  Output    901 ml  Net    109 ml   Filed Weights   08/15/14 2323 08/16/14 2003 08/17/14 2051  Weight: 83.3 kg (183 lb 10.3 oz) 86.1 kg (189 lb 13.1 oz) 85.5 kg (188 lb 7.9 oz)    Exam:   General:  Comfortable at rest.  Cardiovascular: S1-S2 normal. No murmurs. Pulse regular.  Respiratory: Good air entry bilaterally. No rhonchi or rales.  Abdomen: Soft and nontender. Normal bowel sounds. No organomegaly.  Musculoskeletal: No pedal edema   Neurological: No acute deficits  Data Reviewed: Basic Metabolic Panel:  Recent Labs Lab 08/15/14 1948 08/16/14 0511 08/17/14 0705 08/18/14 0719  NA 136 138 137 138  K 4.5 5.2* 4.0 3.9  CL 105 104 103 108  CO2 24 26 25 23   GLUCOSE 128* 89 127* 137*  BUN 27* 28* 26* 20  CREATININE 1.09 1.19 1.27 0.99  CALCIUM 8.9 8.7 8.6 8.1*  MG  --   --  2.1  --   PHOS  --   --  2.4  --    Liver Function Tests:  Recent Labs Lab 08/17/14 0705 08/18/14 0719  AST 41* 25  ALT 10 6  ALKPHOS 129* 132*  BILITOT 1.2 0.9  PROT 6.3 6.1  ALBUMIN 2.9* 2.6*    Recent Labs Lab 08/17/14 1630  LIPASE 85*   No results for input(s): AMMONIA in the last 168  hours. CBC:  Recent Labs Lab 08/15/14 1948 08/16/14 0511 08/17/14 0705 08/18/14 0719  WBC 19.3* 22.4* 21.4* 15.4*  NEUTROABS 17.7*  --  19.4* 14.0*  HGB 14.8 14.1 12.8* 12.4*  HCT 43.4 42.9 39.5 37.7*  MCV 93.1 95.1 94.5 94.0  PLT 162 143* 150 132*   Cardiac  Enzymes: No results for input(s): CKTOTAL, CKMB, CKMBINDEX, TROPONINI in the last 168 hours. BNP (last 3 results) No results for input(s): BNP in the last 8760 hours.  ProBNP (last 3 results) No results for input(s): PROBNP in the last 8760 hours.  CBG:  Recent Labs Lab 08/16/14 2232  GLUCAP 134*    Recent Results (from the past 240 hour(s))  Culture, blood (routine x 2)     Status: None (Preliminary result)   Collection Time: 08/15/14  7:40 PM  Result Value Ref Range Status   Specimen Description BLOOD ARM RIGHT  Final   Special Requests BOTTLES DRAWN AEROBIC AND ANAEROBIC 5CC  Final   Culture   Final           BLOOD CULTURE RECEIVED NO GROWTH TO DATE CULTURE WILL BE HELD FOR 5 DAYS BEFORE ISSUING A FINAL NEGATIVE REPORT Performed at [facility]    Report Status PENDING  Incomplete  Culture, blood (routine x 2)     Status: None (Preliminary result)   Collection Time: 08/15/14  7:45 PM  Result Value Ref Range Status   Specimen Description BLOOD HAND RIGHT  Final   Special Requests BOTTLES DRAWN AEROBIC ONLY 3CC  Final   Culture   Final           BLOOD CULTURE RECEIVED NO GROWTH TO DATE CULTURE WILL BE HELD FOR 5 DAYS BEFORE ISSUING A FINAL NEGATIVE REPORT Performed at [facility]    Report Status PENDING  Incomplete  Urine culture     Status: None   Collection Time: 08/15/14  8:45 PM  Result Value Ref Range Status   Specimen Description URINE, CATHETERIZED  Final   Special Requests NONE  Final   Colony Count NO GROWTH Performed at [facility]   Final   Culture NO GROWTH Performed at [facility]   Final   Report Status 08/17/2014 FINAL  Final     Studies: Dg Chest Port 1 View  08/18/2014   CLINICAL DATA:  Colon cancer.  Hypoxia.  EXAM: PORTABLE CHEST - 1 VIEW  COMPARISON:  CT chest 08/16/2014.  FINDINGS: The heart size is exaggerated by low lung volumes. Mild pulmonary vascular congestion is again noted. Bilateral pleural  effusions are evident. Mild bibasilar airspace disease is again noted the worse on the left. The right hemidiaphragm is elevated without change.  IMPRESSION: 1. Stable low volumes. 2. Small bilateral pleural effusions. 3. Persistent bibasilar airspace disease and. Given the effusions, this likely reflects atelectasis but infection is also considered.   Electronically Signed   By: San Morelle M.D.   On: 08/18/2014 09:18    Scheduled Meds: . beta carotene w/minerals  2 tablet Oral Daily  . carbidopa-levodopa  1 tablet Oral QHS  . carbidopa-levodopa  3 tablet Oral TID  . cholecalciferol  2,000 Units Oral Daily  . DULoxetine  60 mg Oral BID  . enoxaparin (LOVENOX) injection  40 mg Subcutaneous Q24H  . ertapenem  1 g Intravenous Q24H  . multivitamin with minerals  1 tablet Oral Daily  . pregabalin  150 mg Oral BID  . Rivastigmine  13.3 mg Transdermal Daily  .  zinc sulfate  220 mg Oral q1800   Continuous Infusions: . sodium chloride 50 mL/hr at 08/17/14 2304     Time spent: 20 minutes    Machai Desmith  [facility] Pager [phone]. If 7PM-7AM, please contact night-coverage at [email or web address], password [facility] 08/18/2014, 2:42 PM  LOS: 3 days

## 2014-08-19 DIAGNOSIS — R509 Fever, unspecified: Secondary | ICD-10-CM | POA: Insufficient documentation

## 2014-08-19 DIAGNOSIS — I10 Essential (primary) hypertension: Secondary | ICD-10-CM

## 2014-08-19 LAB — CBC
HEMATOCRIT: 36.3 % — AB (ref 39.0–52.0)
HEMOGLOBIN: 12 g/dL — AB (ref 13.0–17.0)
MCH: 31.5 pg (ref 26.0–34.0)
MCHC: 33.1 g/dL (ref 30.0–36.0)
MCV: 95.3 fL (ref 78.0–100.0)
Platelets: 140 10*3/uL — ABNORMAL LOW (ref 150–400)
RBC: 3.81 MIL/uL — ABNORMAL LOW (ref 4.22–5.81)
RDW: 14.8 % (ref 11.5–15.5)
WBC: 12.7 10*3/uL — ABNORMAL HIGH (ref 4.0–10.5)

## 2014-08-19 LAB — BASIC METABOLIC PANEL
Anion gap: 12 (ref 5–15)
BUN: 19 mg/dL (ref 6–23)
CO2: 23 mmol/L (ref 19–32)
Calcium: 8.3 mg/dL — ABNORMAL LOW (ref 8.4–10.5)
Chloride: 104 mmol/L (ref 96–112)
Creatinine, Ser: 0.92 mg/dL (ref 0.50–1.35)
GFR calc Af Amer: 85 mL/min — ABNORMAL LOW (ref >90)
GFR calc non Af Amer: 73 mL/min — ABNORMAL LOW (ref >90)
Glucose, Bld: 140 mg/dL — ABNORMAL HIGH (ref 70–99)
POTASSIUM: 3.7 mmol/L (ref 3.5–5.1)
Sodium: 139 mmol/L (ref 135–145)

## 2014-08-19 LAB — CANCER ANTIGEN 19-9: CA 19-9: 98 U/mL — ABNORMAL HIGH (ref 0–35)

## 2014-08-19 MED ORDER — STARCH (THICKENING) PO POWD
ORAL | Status: DC | PRN
  Filled 2014-08-19: qty 227

## 2014-08-19 NOTE — Progress Notes (Signed)
Speech Language Pathology Treatment: Dysphagia  Patient Details Name: Paul Callahan MRN: [id number] DOB: 02/03/1924 Today's Date: 08/19/2014 Time: 1320-1340 SLP Time Calculation (min) (ACUTE ONLY): 20 min  Assessment / Plan / Recommendation Clinical Impression  F/u after yesterday's swallow assessment.  Pt is more lethargic today; increased temp; worsening breath sounds.  ID is following.  Spouse feeding pt in reclined position. We reviewed aspiration precautions, including necessity of sitting upright, and feeding pt only if he is sufficiently alert.  Pt communicating minimally, requiring mod verbal cues to attend to POs and masticate solids.  He consumed several boluses of nectar-thick liquid with no overt s/s of aspiration.  Recommend continuing current diet - feed only when alert.  Paul Callahan agrees; verbalizes stress over her own medical condition in addition to caring for her husband.  SLP will follow.    HPI HPI: Pt is an 81 y.o. male who was brought to the ED due to complaints of ABD pain since last night along with weakness and increased somnolence and fevers today.He had been placed on Amoxicillin initially for a UTI, which was changed to Augmentin/Clavulinic Acid 500 mg BID 1 days ago for a UT when his culture results returned.He had been having Urinary Frequency and urgency for the past 2-3 weeks and was seen by his PCP and started on an antibiotic Rx.   Pertinent Vitals Pain Assessment: No/denies pain  SLP Plan  Continue with current plan of care    Recommendations Diet recommendations: Dysphagia 3 (mechanical soft);Nectar-thick liquid Liquids provided via: Straw;Cup Medication Administration: Whole meds with puree Supervision: Staff to assist with self feeding;Full supervision/cueing for compensatory strategies Compensations: Slow rate;Small sips/bites Postural Changes and/or Swallow Maneuvers: Seated upright 90 degrees              Oral Care Recommendations: Oral care  BID Follow up Recommendations: Skilled Nursing facility Plan: Continue with current plan of care   Paul Callahan L. Tivis Ringer, [place] CCC/SLP Pager [phone]      Paul Callahan 08/19/2014, 1:45 PM

## 2014-08-19 NOTE — Progress Notes (Signed)
Dr. Sanjuana Letters stated speak with infectious disease. Spoke with Dr. Megan Salon in room advised of elevated temp 102.8.

## 2014-08-19 NOTE — Progress Notes (Signed)
Patient ID: Paul Callahan, male   DOB: May 15, 1924, 81 y.o.   MRN: [id number]         [facility]    Date of Admission:  08/15/2014   Total days of antibiotics 11        Day 3 ertapenam         Principal Problem:   Sepsis Active Problems:   Parkinsonism   Dementia without behavioral disturbance   UTI (lower urinary tract infection)   Weakness   Leukocytosis   Acute encephalopathy   Blood poisoning   Enterobacter sepsis   Aspiration pneumonia   Atelectasis   Pancreatic lesion   Pancreatitis, acute   Dementia with behavioral disturbance   Dysphagia   Failure to thrive in adult   Essential hypertension   . amLODipine  5 mg Oral Daily  . beta carotene w/minerals  2 tablet Oral Daily  . carbidopa-levodopa  1 tablet Oral QHS  . carbidopa-levodopa  3 tablet Oral TID  . cholecalciferol  2,000 Units Oral Daily  . DULoxetine  60 mg Oral BID  . enoxaparin (LOVENOX) injection  40 mg Subcutaneous Q24H  . ertapenem  1 g Intravenous Q24H  . multivitamin with minerals  1 tablet Oral Daily  . pregabalin  150 mg Oral BID  . Rivastigmine  13.3 mg Transdermal Daily  . zinc sulfate  220 mg Oral q1800    Past Medical History  Diagnosis Date  . Parkinson disease     'tremors are worsened at this time than before"  . Dementia   . Depression   . Bowel obstruction 12/15/2011  . Neuromuscular disorder     hx of parkinsons  . Arthritis   . Sepsis 05/20/2014  . Cancer     hx of colon cancer(surgery only)  . Impaired mobility     since hospital visit 11'15 overall weakness of legs -ambulates with walker" with assist    History  Substance Use Topics  . Smoking status: Former Research scientist (life sciences)  . Smokeless tobacco: Never Used  . Alcohol Use: No    History reviewed. No pertinent family history. Allergies  Allergen Reactions  . Amantadines Other (See Comments)    Weakness in legs and hallucinations  . Fentanyl Other (See Comments)    Confusion and disorientation    . Gabapentin Other (See Comments)    Weakness in legs and hallucinations  . Oxycodone Other (See Comments)    Confusion and disorientation   . Phenergan [Promethazine Hcl] Other (See Comments)    Confusion and disorientation   . Vancomycin Itching, Rash and Other (See Comments)    Red man's syndrome   . Ciprofloxacin Diarrhea and Rash    OBJECTIVE: Blood pressure 150/66, pulse 92, temperature 102.8 F (39.3 C), temperature source Oral, resp. rate 18, height 5\' 7"  (1.702 m), weight 188 lb 7.9 oz (85.5 kg), SpO2 95 %. General: He remains very lethargic and unresponsive to questions. Skin: No rash Lungs: Clear. His wife has noted him coughing some since he was hospitalized Cor: Regular S1 and S2 with no murmur Abdomen: Soft   Lab Results Lab Results  Component Value Date   WBC 12.7* 08/19/2014   HGB 12.0* 08/19/2014   HCT 36.3* 08/19/2014   MCV 95.3 08/19/2014   PLT 140* 08/19/2014    Lab Results  Component Value Date   CREATININE 0.92 08/19/2014   BUN 19 08/19/2014   NA 139 08/19/2014   K 3.7 08/19/2014   CL 104  08/19/2014   CO2 23 08/19/2014    Lab Results  Component Value Date   ALT 6 08/18/2014   AST 25 08/18/2014   ALKPHOS 132* 08/18/2014   BILITOT 0.9 08/18/2014     Microbiology: Recent Results (from the past 240 hour(s))  Culture, blood (routine x 2)     Status: None (Preliminary result)   Collection Time: 08/15/14  7:40 PM  Result Value Ref Range Status   Specimen Description BLOOD ARM RIGHT  Final   Special Requests BOTTLES DRAWN AEROBIC AND ANAEROBIC 5CC  Final   Culture   Final           BLOOD CULTURE RECEIVED NO GROWTH TO DATE CULTURE WILL BE HELD FOR 5 DAYS BEFORE ISSUING A FINAL NEGATIVE REPORT Performed at [facility]    Report Status PENDING  Incomplete  Culture, blood (routine x 2)     Status: None (Preliminary result)   Collection Time: 08/15/14  7:45 PM  Result Value Ref Range Status   Specimen Description BLOOD HAND RIGHT   Final   Special Requests BOTTLES DRAWN AEROBIC ONLY 3CC  Final   Culture   Final           BLOOD CULTURE RECEIVED NO GROWTH TO DATE CULTURE WILL BE HELD FOR 5 DAYS BEFORE ISSUING A FINAL NEGATIVE REPORT Performed at [facility]    Report Status PENDING  Incomplete  Urine culture     Status: None   Collection Time: 08/15/14  8:45 PM  Result Value Ref Range Status   Specimen Description URINE, CATHETERIZED  Final   Special Requests NONE  Final   Colony Count NO GROWTH Performed at [facility]   Final   Culture NO GROWTH Performed at [facility]   Final   Report Status 08/17/2014 FINAL  Final    Assessment: He continues to have fever on broad empiric antibiotic therapy for possible urinary tract infection and pneumonia. His wife states that he became slightly more lethargic than normal about 12 days ago. She was concerned that he might have a urinary tract infection because he had mentioned some urgency and dysuria. She could not get him to see his chronic care physician, Dr. [name], right away so she started him on amoxicillin that she had at home. She took him to see Dr. Alyson Ingles several days later. A urine culture obtained at that time grew Citrobacter and Dr. Alyson Ingles switched him to oral trimethoprim sulfamethoxazole on 08/14/2014. He became more lethargic and febrile leading to admission. Urine and blood cultures here have been negative. It is possible that he has partially treated urinary tract infection or early basilar pneumonia. CT scan also revealed some mild, acute pancreatitis that could be causing some fever.  Plan: 1. Continue ertapenem for now 2. Repeat cultures if he remains febrile  Michel Bickers, MD [facility] [place] [phone] pager   [phone] cell 08/19/2014, 12:35 PM

## 2014-08-19 NOTE — Progress Notes (Signed)
UR Completed.  [phone]  

## 2014-08-19 NOTE — Progress Notes (Signed)
[facility] PROGRESS NOTE  Paul Callahan YIR:[id number] DOB: August 05, 1923 DOA: 08/15/2014 PCP: [name], MD  Summary Appreciate ID. Paul Callahan is an 81 year old male with Parkinson's disease/dementia multiple drug allergies, among other medical problems, who came in with generalized weakness associated with shortness of breath and was found to be septic with white count of 19,000 despite outpatient antibiotics for close to a week(?Augmentin) given for UTI. Chest x-ray was unrevealing but this could be because patient was dry at admission. There is element of aspiration suggested by swallow evaluation done on 08/18/2014 SLP. Patient was hospitalized in November 2015 and at that time blood cultures grew ENTEROBACTER AMNIGENUS. Urine culture/blood cultures so far unrevealing. [place] has improved to 15400 on Invanz. CT chest abdomen and pelvis showed "Bilateral lower lobe atelectasis versus infiltrates. Mild acute pancreatitis. 2.1 cm indeterminate low-attenuation lesion at the pancreatic neck has decreased in size from 2.8 cm previously. Differential diagnosis includes small pseudocyst, peripancreatic lymphadenopathy, or pancreatic neoplasm. Consider continued followup by CT versus abdomen MRI without and with contrast if patient can cooperate with breath holding". I spoke with [name] regarding EUS results-benign. Patient will need short-term rehabilitation placement. He should be on dysphagia diet per speech therapy recommendations. He feels better today. He responds to questions appropriately but remains lethargic. His white count has improved to 12,700. We'll therefore continue current antibiotics and hopefully transfusion to SNF in the next few days. Plan Sepsis/Aspiration pneumonia/Atelectasis/UTI (lower urinary tract infection)/Leukocytosis  Follow septic workup-negative so far  Bronchodilators/oxygen supplementation as needed/incentive spirometry  Antibiotics per infectious  diseases- on Invanz  Follow Speech therapy recommendations Acute encephalopathy/Weakness/Dementia without behavioral disturbance/Parkinsonism  Continue home medications  Physical therapy Mild acute pancreatitis/pancreatic lesion  No acute changes Essential hypertension  Norvasc, adjust to optimize control  Code Status: Full Family Communication: Updated patient's wife at bedside. Disposition Plan: SNF.   Consultants:  ID  Procedures:    Antibiotics:  Invanz 08/17/14>  HPI/Subjective: Denies any complaints.  Objective: Filed Vitals:   08/19/14 0912  BP: 152/71  Pulse: 90  Temp: 98.6 F (37 C)  Resp: 18    Intake/Output Summary (Last 24 hours) at 08/19/14 0932 Last data filed at 08/19/14 0913  Gross per 24 hour  Intake    770 ml  Output      0 ml  Net    770 ml   Filed Weights   08/15/14 2323 08/16/14 2003 08/17/14 2051  Weight: 83.3 kg (183 lb 10.3 oz) 86.1 kg (189 lb 13.1 oz) 85.5 kg (188 lb 7.9 oz)    Exam:   General:  Comfortable at rest.  Cardiovascular: S1-S2 normal. No murmurs. Pulse regular.  Respiratory: Good air entry bilaterally. No rhonchi or rales.  Abdomen: Soft and nontender. Normal bowel sounds. No organomegaly.  Musculoskeletal: No pedal edema   Neurological: More with it.  Data Reviewed: Basic Metabolic Panel:  Recent Labs Lab 08/15/14 1948 08/16/14 0511 08/17/14 0705 08/18/14 0719 08/19/14 0432  NA 136 138 137 138 139  K 4.5 5.2* 4.0 3.9 3.7  CL 105 104 103 108 104  CO2 24 26 25 23 23   GLUCOSE 128* 89 127* 137* 140*  BUN 27* 28* 26* 20 19  CREATININE 1.09 1.19 1.27 0.99 0.92  CALCIUM 8.9 8.7 8.6 8.1* 8.3*  MG  --   --  2.1  --   --   PHOS  --   --  2.4  --   --    Liver Function Tests:  Recent  Labs Lab 08/17/14 0705 08/18/14 0719  AST 41* 25  ALT 10 6  ALKPHOS 129* 132*  BILITOT 1.2 0.9  PROT 6.3 6.1  ALBUMIN 2.9* 2.6*    Recent Labs Lab 08/17/14 1630  LIPASE 85*   No results for input(s):  AMMONIA in the last 168 hours. CBC:  Recent Labs Lab 08/15/14 1948 08/16/14 0511 08/17/14 0705 08/18/14 0719 08/19/14 0432  WBC 19.3* 22.4* 21.4* 15.4* 12.7*  NEUTROABS 17.7*  --  19.4* 14.0*  --   HGB 14.8 14.1 12.8* 12.4* 12.0*  HCT 43.4 42.9 39.5 37.7* 36.3*  MCV 93.1 95.1 94.5 94.0 95.3  PLT 162 143* 150 132* 140*   Cardiac Enzymes: No results for input(s): CKTOTAL, CKMB, CKMBINDEX, TROPONINI in the last 168 hours. BNP (last 3 results) No results for input(s): BNP in the last 8760 hours.  ProBNP (last 3 results) No results for input(s): PROBNP in the last 8760 hours.  CBG:  Recent Labs Lab 08/16/14 2232  GLUCAP 134*    Recent Results (from the past 240 hour(s))  Culture, blood (routine x 2)     Status: None (Preliminary result)   Collection Time: 08/15/14  7:40 PM  Result Value Ref Range Status   Specimen Description BLOOD ARM RIGHT  Final   Special Requests BOTTLES DRAWN AEROBIC AND ANAEROBIC 5CC  Final   Culture   Final           BLOOD CULTURE RECEIVED NO GROWTH TO DATE CULTURE WILL BE HELD FOR 5 DAYS BEFORE ISSUING A FINAL NEGATIVE REPORT Performed at [facility]    Report Status PENDING  Incomplete  Culture, blood (routine x 2)     Status: None (Preliminary result)   Collection Time: 08/15/14  7:45 PM  Result Value Ref Range Status   Specimen Description BLOOD HAND RIGHT  Final   Special Requests BOTTLES DRAWN AEROBIC ONLY 3CC  Final   Culture   Final           BLOOD CULTURE RECEIVED NO GROWTH TO DATE CULTURE WILL BE HELD FOR 5 DAYS BEFORE ISSUING A FINAL NEGATIVE REPORT Performed at [facility]    Report Status PENDING  Incomplete  Urine culture     Status: None   Collection Time: 08/15/14  8:45 PM  Result Value Ref Range Status   Specimen Description URINE, CATHETERIZED  Final   Special Requests NONE  Final   Colony Count NO GROWTH Performed at [facility]   Final   Culture NO GROWTH Performed at [facility]   Final   Report Status 08/17/2014 FINAL  Final     Studies: Dg Chest Port 1 View  08/18/2014   CLINICAL DATA:  Colon cancer.  Hypoxia.  EXAM: PORTABLE CHEST - 1 VIEW  COMPARISON:  CT chest 08/16/2014.  FINDINGS: The heart size is exaggerated by low lung volumes. Mild pulmonary vascular congestion is again noted. Bilateral pleural effusions are evident. Mild bibasilar airspace disease is again noted the worse on the left. The right hemidiaphragm is elevated without change.  IMPRESSION: 1. Stable low volumes. 2. Small bilateral pleural effusions. 3. Persistent bibasilar airspace disease and. Given the effusions, this likely reflects atelectasis but infection is also considered.   Electronically Signed   By: San Morelle M.D.   On: 08/18/2014 09:18    Scheduled Meds: . amLODipine  5 mg Oral Daily  . beta carotene w/minerals  2 tablet Oral Daily  . carbidopa-levodopa  1 tablet  Oral QHS  . carbidopa-levodopa  3 tablet Oral TID  . cholecalciferol  2,000 Units Oral Daily  . DULoxetine  60 mg Oral BID  . enoxaparin (LOVENOX) injection  40 mg Subcutaneous Q24H  . ertapenem  1 g Intravenous Q24H  . multivitamin with minerals  1 tablet Oral Daily  . pregabalin  150 mg Oral BID  . Rivastigmine  13.3 mg Transdermal Daily  . zinc sulfate  220 mg Oral q1800   Continuous Infusions:    Time spent: 25 minutes    Ibtisam Benge  [facility] Pager [phone]. If 7PM-7AM, please contact night-coverage at [email or web address], password [facility] 08/19/2014, 9:32 AM  LOS: 4 days

## 2014-08-19 NOTE — Progress Notes (Signed)
Paged Dr. Sanjuana Letters FYI temp 102.8 and pts wife wants to speak with.

## 2014-08-20 ENCOUNTER — Inpatient Hospital Stay (HOSPITAL_COMMUNITY): Payer: Medicare Other

## 2014-08-20 NOTE — Progress Notes (Addendum)
[facility] PROGRESS NOTE  Paul Callahan ZMO:[id number] DOB: 1924/03/15 DOA: 08/15/2014 PCP: [name], MD  Summary Appreciate ID. Paul Callahan is an 81 year old male with Parkinson's disease/dementia multiple drug allergies, among other medical problems, who came in with generalized weakness associated with shortness of breath and was found to be septic with white count of 19,000 despite outpatient antibiotics for close to a week(?Augmentin) given for UTI. Chest x-ray was unrevealing but this could be because patient was dry at admission. There is element of aspiration suggested by swallow evaluation done on 08/18/2014 SLP. Patient was hospitalized in November 2015 and at that time blood cultures grew ENTEROBACTER AMNIGENUS. Urine culture/blood cultures so far unrevealing. [place] improved to [address]400 on Invanz. CT chest abdomen and pelvis showed "Bilateral lower lobe atelectasis versus infiltrates. Mild acute pancreatitis. 2.1 cm indeterminate low-attenuation lesion at the pancreatic neck has decreased in size from 2.8 cm previously. Differential diagnosis includes small pseudocyst, peripancreatic lymphadenopathy, or pancreatic neoplasm. Consider continued followup by CT versus abdomen MRI without and with contrast if patient can cooperate with breath holding". I spoke with Dr. Paulita Callahan regarding EUS results-benign. Patient will need short-term rehabilitation placement. He should be on dysphagia diet per speech therapy recommendations. He feels better today. He responds to questions appropriately but remains lethargic. His white count has improved to 12,700. We'll therefore continue current antibiotics and hopefully transfusion to SNF in the next few days.   Plan Sepsis/Aspiration pneumonia?/Atelectasis/UTI (lower urinary tract infection)?/Leukocytosis  Culture no growth so far, day 4 of ertapenem, infectious disease following, continues to have high-grade fever  Bronchodilators/oxygen  supplementation as needed/incentive spirometry  Antibiotics per infectious diseases- on Invanz  Speech therapy recommends dysphagia 3 diet with nectar thick liquids  Repeat chest x-ray 2/17 obtained-stable by basilar changes, no new focal abnormality  Repeat blood culture 2, today, repeat UA, previous UA -2/12 Acute encephalopathy/Weakness/Dementia without behavioral disturbance/Parkinsonism  Continue Sinemet, Cymbalta, Lyrica, rivastigmine  Physical therapy recommends SNF  Reviewed speech therapy recommendations Mild acute pancreatitis/pancreatic lesion  No acute changes Essential hypertension  Norvasc, adjust to optimize control  Continue prn  hydralazine  Code Status: Full Family Communication: Updated patient's wife at bedside. Disposition Plan: SNF. Disposition per infectious disease recommendations   Consultants:  ID  Procedures:    Antibiotics:  Invanz 08/17/14>  HPI/Subjective: Febrile again this morning with a fever of 102.2, blood pressure stable.  Objective: Filed Vitals:   08/20/14 1236  BP:   Pulse:   Temp: 98.4 F (36.9 C)  Resp:     Intake/Output Summary (Last 24 hours) at 08/20/14 1407 Last data filed at 08/20/14 0848  Gross per 24 hour  Intake    240 ml  Output    950 ml  Net   -710 ml   Filed Weights   08/16/14 2003 08/17/14 2051 08/19/14 2326  Weight: 86.1 kg (189 lb 13.1 oz) 85.5 kg (188 lb 7.9 oz) 84.6 kg (186 lb 8.2 oz)    Exam:  General: He is more alert today and answer simple questions. Skin: No rash. IV site normal Lungs: Clear. He has a loose cough Cor: Regular S1 and S2 with no murmur Abdomen: Soft and nontender. No diarrhea reported by nurse  Data Reviewed: Basic Metabolic Panel:  Recent Labs Lab 08/15/14 1948 08/16/14 0511 08/17/14 0705 08/18/14 0719 08/19/14 0432  NA 136 138 137 138 139  K 4.5 5.2* 4.0 3.9 3.7  CL 105 104 103 108 104  CO2 24 26 25 23  23  GLUCOSE 128* 89 127* 137* 140*  BUN 27* 28*  26* 20 19  CREATININE 1.09 1.19 1.27 0.99 0.92  CALCIUM 8.9 8.7 8.6 8.1* 8.3*  MG  --   --  2.1  --   --   PHOS  --   --  2.4  --   --    Liver Function Tests:  Recent Labs Lab 08/17/14 0705 08/18/14 0719  AST 41* 25  ALT 10 6  ALKPHOS 129* 132*  BILITOT 1.2 0.9  PROT 6.3 6.1  ALBUMIN 2.9* 2.6*    Recent Labs Lab 08/17/14 1630  LIPASE 85*   No results for input(s): AMMONIA in the last 168 hours. CBC:  Recent Labs Lab 08/15/14 1948 08/16/14 0511 08/17/14 0705 08/18/14 0719 08/19/14 0432  WBC 19.3* 22.4* 21.4* 15.4* 12.7*  NEUTROABS 17.7*  --  19.4* 14.0*  --   HGB 14.8 14.1 12.8* 12.4* 12.0*  HCT 43.4 42.9 39.5 37.7* 36.3*  MCV 93.1 95.1 94.5 94.0 95.3  PLT 162 143* 150 132* 140*   Cardiac Enzymes: No results for input(s): CKTOTAL, CKMB, CKMBINDEX, TROPONINI in the last 168 hours. BNP (last 3 results) No results for input(s): BNP in the last 8760 hours.  ProBNP (last 3 results) No results for input(s): PROBNP in the last 8760 hours.  CBG:  Recent Labs Lab 08/16/14 2232  GLUCAP 134*    Recent Results (from the past 240 hour(s))  Culture, blood (routine x 2)     Status: None (Preliminary result)   Collection Time: 08/15/14  7:40 PM  Result Value Ref Range Status   Specimen Description BLOOD ARM RIGHT  Final   Special Requests BOTTLES DRAWN AEROBIC AND ANAEROBIC 5CC  Final   Culture   Final           BLOOD CULTURE RECEIVED NO GROWTH TO DATE CULTURE WILL BE HELD FOR 5 DAYS BEFORE ISSUING A FINAL NEGATIVE REPORT Performed at [facility]    Report Status PENDING  Incomplete  Culture, blood (routine x 2)     Status: None (Preliminary result)   Collection Time: 08/15/14  7:45 PM  Result Value Ref Range Status   Specimen Description BLOOD HAND RIGHT  Final   Special Requests BOTTLES DRAWN AEROBIC ONLY 3CC  Final   Culture   Final           BLOOD CULTURE RECEIVED NO GROWTH TO DATE CULTURE WILL BE HELD FOR 5 DAYS BEFORE ISSUING A FINAL NEGATIVE  REPORT Performed at [facility]    Report Status PENDING  Incomplete  Urine culture     Status: None   Collection Time: 08/15/14  8:45 PM  Result Value Ref Range Status   Specimen Description URINE, CATHETERIZED  Final   Special Requests NONE  Final   Colony Count NO GROWTH Performed at [facility]   Final   Culture NO GROWTH Performed at [facility]   Final   Report Status 08/17/2014 FINAL  Final     Studies: Dg Chest Port 1 View  08/20/2014   CLINICAL DATA:  Cough and weakness  EXAM: PORTABLE CHEST - 1 VIEW  COMPARISON:  08/18/2014  FINDINGS: Cardiac shadow is within normal limits. Bibasilar atelectatic changes are again noted. No focal infiltrate  IMPRESSION: Stable bibasilar atelectatic changes. No new focal abnormality is seen.   Electronically Signed   By: Inez Catalina M.D.   On: 08/20/2014 11:34    Scheduled Meds: . amLODipine  5  mg Oral Daily  . beta carotene w/minerals  2 tablet Oral Daily  . carbidopa-levodopa  1 tablet Oral QHS  . carbidopa-levodopa  3 tablet Oral TID  . cholecalciferol  2,000 Units Oral Daily  . DULoxetine  60 mg Oral BID  . enoxaparin (LOVENOX) injection  40 mg Subcutaneous Q24H  . ertapenem  1 g Intravenous Q24H  . multivitamin with minerals  1 tablet Oral Daily  . pregabalin  150 mg Oral BID  . Rivastigmine  13.3 mg Transdermal Daily  . zinc sulfate  220 mg Oral q1800   Continuous Infusions:    Time spent: 25 minutes    [facility]  [facility] Pager [phone]. If 7PM-7AM, please contact night-coverage at [email or web address], password [facility] 08/20/2014, 2:07 PM  LOS: 5 days

## 2014-08-20 NOTE — Progress Notes (Signed)
Pt temp re-check 100.5

## 2014-08-20 NOTE — Progress Notes (Signed)
Speech Language Pathology Treatment: Dysphagia  Patient Details Name: Paul Callahan MRN: [id number] DOB: [birth year] Today's Date: 08/20/2014 Time: 4235-3614 SLP Time Calculation (min) (ACUTE ONLY): 15 min  Assessment / Plan / Recommendation Clinical Impression  Continued f/u for dysphagia.  Pt more alert today; seated upright in bed, assisted with nectar-consistency fluids.  Pt with intermittent, delayed, mild cough throughout session.  He requires total assist, hand-over-hand feeding at this point.   His wife reports coughing at lunch.  Given dx of pna, symptoms of aspiration despite diet modifications, and reliance on others for feeding, recommend pursuing MBS to determine safest PO diet and identify most appropriate therapeutic exercise.  Mrs. Rinker agrees with recommendations; she requires repeated information to ensure full understanding.  Will proceed with MBS next date per original orders written 2/15.     HPI HPI: Pt is an 81 y.o. male who was brought to the ED due to complaints of ABD pain since last night along with weakness and increased somnolence and fevers today.He had been placed on Amoxicillin initially for a UTI, which was changed to Augmentin/Clavulinic Acid 500 mg BID 1 days ago for a UT when his culture results returned.He had been having Urinary Frequency and urgency for the past 2-3 weeks and was seen by his PCP and started on an antibiotic Rx.   Pertinent Vitals Pain Assessment: No/denies pain Faces Pain Scale: No hurt  SLP Plan  Continue with current plan of care;MBS    Recommendations Diet recommendations: Dysphagia 3 (mechanical soft);Nectar-thick liquid Liquids provided via: Straw;Cup Medication Administration: Whole meds with puree Supervision: Staff to assist with self feeding;Full supervision/cueing for compensatory strategies Compensations: Slow rate;Small sips/bites Postural Changes and/or Swallow Maneuvers: Seated upright 90 degrees              Oral  Care Recommendations: Oral care BID Follow up Recommendations: Skilled Nursing facility Plan: Continue with current plan of care;MBS   Paul Callahan Paul Callahan, [place] CCC/SLP Pager [phone]      Paul Callahan 08/20/2014, 3:14 PM

## 2014-08-20 NOTE — Progress Notes (Signed)
Patient ID: Paul Callahan, male   DOB: [birth year], 81 y.o.   MRN: [id number]         [facility]    Date of Admission:  08/15/2014   Total days of antibiotics 12        Day 4 ertapenam         Principal Problem:   Sepsis Active Problems:   Parkinsonism   Dementia without behavioral disturbance   UTI (lower urinary tract infection)   Weakness   Leukocytosis   Acute encephalopathy   Blood poisoning   Enterobacter sepsis   Aspiration pneumonia   Atelectasis   Pancreatic lesion   Pancreatitis, acute   Dementia with behavioral disturbance   Dysphagia   Failure to thrive in adult   Essential hypertension   Pyrexia   . amLODipine  5 mg Oral Daily  . beta carotene w/minerals  2 tablet Oral Daily  . carbidopa-levodopa  1 tablet Oral QHS  . carbidopa-levodopa  3 tablet Oral TID  . cholecalciferol  2,000 Units Oral Daily  . DULoxetine  60 mg Oral BID  . enoxaparin (LOVENOX) injection  40 mg Subcutaneous Q24H  . ertapenem  1 g Intravenous Q24H  . multivitamin with minerals  1 tablet Oral Daily  . pregabalin  150 mg Oral BID  . Rivastigmine  13.3 mg Transdermal Daily  . zinc sulfate  220 mg Oral q1800    Past Medical History  Diagnosis Date  . Parkinson disease     'tremors are worsened at this time than before"  . Dementia   . Depression   . Bowel obstruction 12/15/2011  . Neuromuscular disorder     hx of parkinsons  . Arthritis   . Sepsis 05/20/2014  . Cancer     hx of colon cancer(surgery only)  . Impaired mobility     since hospital visit 11'15 overall weakness of legs -ambulates with walker" with assist    History  Substance Use Topics  . Smoking status: Former Research scientist (life sciences)  . Smokeless tobacco: Never Used  . Alcohol Use: No    History reviewed. No pertinent family history. Allergies  Allergen Reactions  . Amantadines Other (See Comments)    Weakness in legs and hallucinations  . Fentanyl Other (See Comments)    Confusion and  disorientation   . Gabapentin Other (See Comments)    Weakness in legs and hallucinations  . Oxycodone Other (See Comments)    Confusion and disorientation   . Phenergan [Promethazine Hcl] Other (See Comments)    Confusion and disorientation   . Vancomycin Itching, Rash and Other (See Comments)    Red man's syndrome   . Ciprofloxacin Diarrhea and Rash    SUBJECTIVE: He denies any abdominal pain, nausea or vomiting.  OBJECTIVE: Blood pressure 163/83, pulse 93, temperature 102.2 F (39 C), temperature source Oral, resp. rate 17, height 5\' 7"  (1.702 m), weight 186 lb 8.2 oz (84.6 kg), SpO2 94 %. General: He is more alert today and answer simple questions. Skin: No rash. IV site normal Lungs: Clear. He has a loose cough Cor: Regular S1 and S2 with no murmur Abdomen: Soft and nontender. No diarrhea reported by nurse.   Lab Results Lab Results  Component Value Date   WBC 12.7* 08/19/2014   HGB 12.0* 08/19/2014   HCT 36.3* 08/19/2014   MCV 95.3 08/19/2014   PLT 140* 08/19/2014    Lab Results  Component Value Date   CREATININE 0.92 08/19/2014  BUN 19 08/19/2014   NA 139 08/19/2014   K 3.7 08/19/2014   CL 104 08/19/2014   CO2 23 08/19/2014    Lab Results  Component Value Date   ALT 6 08/18/2014   AST 25 08/18/2014   ALKPHOS 132* 08/18/2014   BILITOT 0.9 08/18/2014     Microbiology: Recent Results (from the past 240 hour(s))  Culture, blood (routine x 2)     Status: None (Preliminary result)   Collection Time: 08/15/14  7:40 PM  Result Value Ref Range Status   Specimen Description BLOOD ARM RIGHT  Final   Special Requests BOTTLES DRAWN AEROBIC AND ANAEROBIC 5CC  Final   Culture   Final           BLOOD CULTURE RECEIVED NO GROWTH TO DATE CULTURE WILL BE HELD FOR 5 DAYS BEFORE ISSUING A FINAL NEGATIVE REPORT Performed at [facility]    Report Status PENDING  Incomplete  Culture, blood (routine x 2)     Status: None (Preliminary result)   Collection Time:  08/15/14  7:45 PM  Result Value Ref Range Status   Specimen Description BLOOD HAND RIGHT  Final   Special Requests BOTTLES DRAWN AEROBIC ONLY 3CC  Final   Culture   Final           BLOOD CULTURE RECEIVED NO GROWTH TO DATE CULTURE WILL BE HELD FOR 5 DAYS BEFORE ISSUING A FINAL NEGATIVE REPORT Performed at [facility]    Report Status PENDING  Incomplete  Urine culture     Status: None   Collection Time: 08/15/14  8:45 PM  Result Value Ref Range Status   Specimen Description URINE, CATHETERIZED  Final   Special Requests NONE  Final   Colony Count NO GROWTH Performed at [facility]   Final   Culture NO GROWTH Performed at [facility]   Final   Report Status 08/17/2014 FINAL  Final    Assessment: I suspect that he has some bibasilar pneumonia causing his current fevers. If he remains febrile overnight I would consider repeat chest x-ray and I will review antibiotic options tomorrow.  Plan: 1. Continue ertapenem for now 2. Check blood culture results  Michel Bickers, MD [facility] for [place] [phone] pager   [phone] cell 08/20/2014, 9:26 AM

## 2014-08-20 NOTE — Progress Notes (Signed)
Pt temp 102.2 gave tylenol 650 po, paged Dr. Jonathon Jordan and stated pt has been spiking fevers and spoke with Dr. Megan Salon infectious disease yesterday.  Dr. Allyson Sabal T/O repeat blood cultures x 2.

## 2014-08-20 NOTE — Progress Notes (Signed)
Physical Therapy Treatment Patient Details Name: Paul Callahan MRN: [id number] DOB: [birth year] Today's Date: 08/20/2014    History of Present Illness Pt is an 81 y.o. male who was brought to the ED due to complaints of ABD pain since last night along with weakness and increased somnolence and fevers today.He had been placed on Amoxicillin initially for a UTI, which was changed to Augmentin/Clavulinic Acid 500 mg BID 1 days ago for a UT when his culture results returned.He had been having Urinary Frequency and urgency for the past 2-3 weeks and was seen by his PCP and started on an antibiotic Rx.    PT Comments    Pt progressing slowly towards physical therapy goals. Wife was present during session and pt was more talkative than on PT eval, however continues to be very flat with delayed responses. Pt continues to require +2 assist for all mobility, and was not able to achieve full standing position even with walker and therapist assist. Gait training was not able to be initiated this session due to poor posture and +2 assist required for standing support. Recommended that nursing staff use Stedy for back to bed. Will continue to follow and progress as able per POC.   Follow Up Recommendations  SNF;Supervision/Assistance - 24 hour     Equipment Recommendations  None recommended by PT    Recommendations for Other Services       Precautions / Restrictions Precautions Precautions: Fall Restrictions Weight Bearing Restrictions: No    Mobility  Bed Mobility Overal bed mobility: Needs Assistance;+2 for physical assistance Bed Mobility: Supine to Sit     Supine to sit: Max assist;+2 for physical assistance     General bed mobility comments: Assist to elevate trunk to full sitting position and scoot out to EOB. Assist initially to maintain seated balance, however was able to hold himself up with UE's when hands were placed out to the side to increase BOS.   Transfers Overall transfer  level: Needs assistance Equipment used: Rolling walker (2 wheeled) Transfers: Sit to/from Stand Sit to Stand: Mod assist;+2 physical assistance Stand pivot transfers: Mod assist;+2 physical assistance       General transfer comment: Pt required assist to power-up to full standing position and maintain balance. Pt was cued for improved posture however maintained a very flexed posture with the walker. Pt required assist for walker movement as well as for support to take pivotal steps around to the recliner chair.   Ambulation/Gait             General Gait Details: Deferred due to increased assist required for SPT to chair.    Stairs            Wheelchair Mobility    Modified Rankin (Stroke Patients Only)       Balance Overall balance assessment: Needs assistance Sitting-balance support: Feet supported;No upper extremity supported Sitting balance-Leahy Scale: Poor     Standing balance support: Bilateral upper extremity supported;During functional activity Standing balance-Leahy Scale: Poor                      Cognition Arousal/Alertness: Awake/alert Behavior During Therapy: Flat affect Overall Cognitive Status: History of cognitive impairments - at baseline                      Exercises      General Comments        Pertinent Vitals/Pain Pain Assessment: Faces Faces Pain Scale: No hurt  Home Living                      Prior Function            PT Goals (current goals can now be found in the care plan section) Acute Rehab PT Goals Patient Stated Goal: Pt was unable to state goals at this time.  PT Goal Formulation: With family Time For Goal Achievement: 09/01/14 Potential to Achieve Goals: Fair Progress towards PT goals: Progressing toward goals    Frequency  Min 2X/week    PT Plan Current plan remains appropriate    Co-evaluation             End of Session Equipment Utilized During Treatment: Gait  belt;Oxygen Activity Tolerance: Patient limited by fatigue Patient left: in chair;with chair alarm set;with call bell/phone within reach;with family/visitor present     Time: 9417-4081 PT Time Calculation (min) (ACUTE ONLY): 19 min  Charges:  $Therapeutic Activity: 8-22 mins                    G Codes:      Rolinda Roan 2014-09-07, 1:46 PM  Rolinda Roan, PT, DPT Acute Rehabilitation Services Pager: [phone]

## 2014-08-20 NOTE — Progress Notes (Signed)
CARE MANAGEMENT NOTE 08/20/2014  Patient:  FREEDOM, PEDDY   Account Number:  [id number]  Date Initiated:  08/20/2014  Documentation initiated by:  [facility]  Subjective/Objective Assessment:   Sepsis, Asp Pneumonia, UTI, Acute Encephalopathy     Action/Plan:   plan SNF placement   Anticipated DC Date:  08/22/2014   Anticipated DC Plan:  SKILLED NURSING FACILITY  In-house referral  Clinical Social Worker      DC Planning Services  CM consult      Choice offered to / List presented to:             Status of service:  Completed, signed off Medicare Important Message given?  YES (If response is "NO", the following Medicare IM given date fields will be blank) Date Medicare IM given:  08/20/2014 Medicare IM given by:  [facility] Date Additional Medicare IM given:   Additional Medicare IM given by:    Discharge Disposition:  [place]  Per UR Regulation:  Reviewed for med. necessity/level of care/duration of stay  If discussed at [place] of Stay Meetings, dates discussed:    Comments:  08/20/2014 1415 Chart reviewed. CSW following for SNF placement. NCM spoke to wife and son. No NCM needs identified. Jonnie Finner RN CCM Case Mgmt phone [phone]

## 2014-08-21 ENCOUNTER — Inpatient Hospital Stay (HOSPITAL_COMMUNITY): Payer: Medicare Other

## 2014-08-21 LAB — URINALYSIS, ROUTINE W REFLEX MICROSCOPIC
Bilirubin Urine: NEGATIVE
Glucose, UA: NEGATIVE mg/dL
Hgb urine dipstick: NEGATIVE
Ketones, ur: 15 mg/dL — AB
LEUKOCYTES UA: NEGATIVE
NITRITE: NEGATIVE
PH: 5 (ref 5.0–8.0)
Protein, ur: 30 mg/dL — AB
Specific Gravity, Urine: 1.024 (ref 1.005–1.030)
Urobilinogen, UA: 0.2 mg/dL (ref 0.0–1.0)

## 2014-08-21 LAB — URINE MICROSCOPIC-ADD ON

## 2014-08-21 NOTE — Progress Notes (Signed)
Patient ID: Paul Callahan, male   DOB: [birth year], 81 y.o.   MRN: [id number]         [facility]    Date of Admission:  08/15/2014   Total days of antibiotics 13        Day 5 ertapenam         Principal Problem:   Sepsis Active Problems:   Parkinsonism   Dementia without behavioral disturbance   UTI (lower urinary tract infection)   Weakness   Leukocytosis   Acute encephalopathy   Blood poisoning   Enterobacter sepsis   Aspiration pneumonia   Atelectasis   Pancreatic lesion   Pancreatitis, acute   Dementia with behavioral disturbance   Dysphagia   Failure to thrive in adult   Essential hypertension   Pyrexia   . amLODipine  5 mg Oral Daily  . beta carotene w/minerals  2 tablet Oral Daily  . carbidopa-levodopa  1 tablet Oral QHS  . carbidopa-levodopa  3 tablet Oral TID  . cholecalciferol  2,000 Units Oral Daily  . DULoxetine  60 mg Oral BID  . enoxaparin (LOVENOX) injection  40 mg Subcutaneous Q24H  . ertapenem  1 g Intravenous Q24H  . multivitamin with minerals  1 tablet Oral Daily  . pregabalin  150 mg Oral BID  . Rivastigmine  13.3 mg Transdermal Daily  . zinc sulfate  220 mg Oral q1800    Past Medical History  Diagnosis Date  . Parkinson disease     'tremors are worsened at this time than before"  . Dementia   . Depression   . Bowel obstruction 12/15/2011  . Neuromuscular disorder     hx of parkinsons  . Arthritis   . Sepsis 05/20/2014  . Cancer     hx of colon cancer(surgery only)  . Impaired mobility     since hospital visit 11'15 overall weakness of legs -ambulates with walker" with assist    History  Substance Use Topics  . Smoking status: Former Research scientist (life sciences)  . Smokeless tobacco: Never Used  . Alcohol Use: No    History reviewed. No pertinent family history. Allergies  Allergen Reactions  . Amantadines Other (See Comments)    Weakness in legs and hallucinations  . Fentanyl Other (See Comments)    Confusion and  disorientation   . Gabapentin Other (See Comments)    Weakness in legs and hallucinations  . Oxycodone Other (See Comments)    Confusion and disorientation   . Phenergan [Promethazine Hcl] Other (See Comments)    Confusion and disorientation   . Vancomycin Itching, Rash and Other (See Comments)    Red man's syndrome   . Ciprofloxacin Diarrhea and Rash    OBJECTIVE: Blood pressure 140/79, pulse 88, temperature 99.7 F (37.6 C), temperature source Oral, resp. rate 16, height 5\' 7"  (1.702 m), weight 186 lb 11.7 oz (84.7 kg), SpO2 94 %.  He is currently out of his room for a barium swallow study. His wife is waiting for him. She has not seen him today but agrees that he was slightly more alert yesterday.   Lab Results Lab Results  Component Value Date   WBC 12.7* 08/19/2014   HGB 12.0* 08/19/2014   HCT 36.3* 08/19/2014   MCV 95.3 08/19/2014   PLT 140* 08/19/2014    Lab Results  Component Value Date   CREATININE 0.92 08/19/2014   BUN 19 08/19/2014   NA 139 08/19/2014   K 3.7 08/19/2014  CL 104 08/19/2014   CO2 23 08/19/2014    Lab Results  Component Value Date   ALT 6 08/18/2014   AST 25 08/18/2014   ALKPHOS 132* 08/18/2014   BILITOT 0.9 08/18/2014     Microbiology: Recent Results (from the past 240 hour(s))  Culture, blood (routine x 2)     Status: None (Preliminary result)   Collection Time: 08/15/14  7:40 PM  Result Value Ref Range Status   Specimen Description BLOOD ARM RIGHT  Final   Special Requests BOTTLES DRAWN AEROBIC AND ANAEROBIC 5CC  Final   Culture   Final           BLOOD CULTURE RECEIVED NO GROWTH TO DATE CULTURE WILL BE HELD FOR 5 DAYS BEFORE ISSUING A FINAL NEGATIVE REPORT Performed at [facility]    Report Status PENDING  Incomplete  Culture, blood (routine x 2)     Status: None (Preliminary result)   Collection Time: 08/15/14  7:45 PM  Result Value Ref Range Status   Specimen Description BLOOD HAND RIGHT  Final   Special Requests  BOTTLES DRAWN AEROBIC ONLY 3CC  Final   Culture   Final           BLOOD CULTURE RECEIVED NO GROWTH TO DATE CULTURE WILL BE HELD FOR 5 DAYS BEFORE ISSUING A FINAL NEGATIVE REPORT Performed at [facility]    Report Status PENDING  Incomplete  Urine culture     Status: None   Collection Time: 08/15/14  8:45 PM  Result Value Ref Range Status   Specimen Description URINE, CATHETERIZED  Final   Special Requests NONE  Final   Colony Count NO GROWTH Performed at [facility]   Final   Culture NO GROWTH Performed at [facility]   Final   Report Status 08/17/2014 FINAL  Final  Culture, blood (routine x 2)     Status: None (Preliminary result)   Collection Time: 08/20/14  9:02 AM  Result Value Ref Range Status   Specimen Description BLOOD RIGHT HAND  Final   Special Requests BOTTLES DRAWN AEROBIC AND ANAEROBIC 4CC  Final   Culture   Final           BLOOD CULTURE RECEIVED NO GROWTH TO DATE CULTURE WILL BE HELD FOR 5 DAYS BEFORE ISSUING A FINAL NEGATIVE REPORT Performed at [facility]    Report Status PENDING  Incomplete  Culture, blood (routine x 2)     Status: None (Preliminary result)   Collection Time: 08/20/14  9:07 AM  Result Value Ref Range Status   Specimen Description BLOOD LEFT ANTECUBITAL  Final   Special Requests BOTTLES DRAWN AEROBIC AND ANAEROBIC 4CC  Final   Culture   Final           BLOOD CULTURE RECEIVED NO GROWTH TO DATE CULTURE WILL BE HELD FOR 5 DAYS BEFORE ISSUING A FINAL NEGATIVE REPORT Performed at [facility]    Report Status PENDING  Incomplete   PORTABLE CHEST - 1 VIEW 08/20/2014  COMPARISON: 08/18/2014  FINDINGS: Cardiac shadow is within normal limits. Bibasilar atelectatic changes are again noted. No focal infiltrate  IMPRESSION: Stable bibasilar atelectatic changes. No new focal abnormality is seen.   Electronically Signed  By: Inez Catalina M.D.  On: 08/20/2014 11:34  Assessment: I suspect  that he has some bibasilar pneumonia causing his current fevers. All recent blood and urine cultures are negative. His temperature curve seems to be improving.  Plan: 1.  Continue ertapenem for now 2. Check blood culture results  Michel Bickers, MD [facility] for [place] [phone] pager   [phone] cell 08/21/2014, 11:23 AM

## 2014-08-21 NOTE — Progress Notes (Signed)
MBSS complete. Full report located under chart review in imaging section. Jhaniya Briski, MA CCC-SLP [phone]  

## 2014-08-21 NOTE — Progress Notes (Addendum)
[facility] PROGRESS NOTE  EBB CARELOCK KGY:[id number] DOB: 10-21-1923 DOA: 08/15/2014 PCP: [name], MD  Summary Appreciate ID. Paul Callahan is an 81 year old male with Parkinson's disease/dementia multiple drug allergies, among other medical problems, who came in with generalized weakness associated with shortness of breath and was found to be septic with white count of 19,000 despite outpatient antibiotics for close to a week(?Augmentin) given for UTI. Chest x-ray was unrevealing but this could be because patient was dry at admission. There is element of aspiration suggested by swallow evaluation done on 08/18/2014 SLP. Patient was hospitalized in November 2015 and at that time blood cultures grew ENTEROBACTER AMNIGENUS. Urine culture/blood cultures so far unrevealing. [place] to [address]400 on Invanz. CT chest abdomen and pelvis showed "Bilateral lower lobe atelectasis versus infiltrates. Mild acute pancreatitis. 2.1 cm indeterminate low-attenuation lesion at the pancreatic neck has decreased in size from 2.8 cm previously. Differential diagnosis includes small pseudocyst, peripancreatic lymphadenopathy, or pancreatic neoplasm. Consider continued followup by CT versus abdomen MRI without and with contrast if patient can cooperate with breath holding". I spoke with [name] regarding EUS results-benign. Patient will need short-term rehabilitation placement. He should be on dysphagia diet per speech therapy recommendations. He feels better today. He responds to questions appropriately but remains lethargic. His white count has improved to 12,700. We'll therefore continue current antibiotics and hopefully transfusion to SNF in the next few days.   Plan Sepsis/Aspiration pneumonia?/Atelectasis/UTI (lower urinary tract infection)?/Leukocytosis  Culture no growth so far, day #5 of ertapenem, infectious disease following, patient afebrile since 2/17, 10 AM, WBC count is  improving  Continue  Bronchodilators/oxygen supplementation as needed/incentive spirometry  Antibiotics per infectious diseases- on Invanz  Speech therapy recommends dysphagia 3 diet with nectar thick liquids  Repeat chest x-ray 2/17 obtained-stable by basilar changes, no new focal abnormality  Blood culture no growth so far, repeat UA pending, previous UA -2/12   Acute encephalopathy/Weakness/Dementia without behavioral disturbance/Parkinsonism  Continue Sinemet, Cymbalta, Lyrica, rivastigmine  Physical therapy recommends SNF  Reviewed speech therapy recommendations-dysphagia 3 diet with nectar thick liquids   Mild acute pancreatitis/pancreatic lesion  No acute changes   Essential hypertension  Norvasc, adjust to optimize control  Continue prn  hydralazine  Code Status: Full Family Communication: Updated patient's wife at bedside. Disposition Plan: SNF. Disposition per infectious disease recommendations   Consultants:  ID  Procedures:    Antibiotics:  Invanz 08/17/14>  HPI/Subjective: Febrile again this morning with a fever of 102.2, blood pressure stable.  Objective: Filed Vitals:   08/21/14 1238  BP: 148/77  Pulse: 85  Temp: 98.3 F (36.8 C)  Resp: 18    Intake/Output Summary (Last 24 hours) at 08/21/14 1345 Last data filed at 08/21/14 1329  Gross per 24 hour  Intake    120 ml  Output      0 ml  Net    120 ml   Filed Weights   08/17/14 2051 08/19/14 2326 08/20/14 2048  Weight: 85.5 kg (188 lb 7.9 oz) 84.6 kg (186 lb 8.2 oz) 84.7 kg (186 lb 11.7 oz)    Exam:  General: He is more alert today and answer simple questions. Skin: No rash. IV site normal Lungs: Clear. He has a loose cough Cor: Regular S1 and S2 with no murmur Abdomen: Soft and nontender. No diarrhea reported by nurse  Data Reviewed: Basic Metabolic Panel:  Recent Labs Lab 08/15/14 1948 08/16/14 0511 08/17/14 0705 08/18/14 0719 08/19/14 0432  NA 136 138 137  138  139  K 4.5 5.2* 4.0 3.9 3.7  CL 105 104 103 108 104  CO2 24 26 25 23 23   GLUCOSE 128* 89 127* 137* 140*  BUN 27* 28* 26* 20 19  CREATININE 1.09 1.19 1.27 0.99 0.92  CALCIUM 8.9 8.7 8.6 8.1* 8.3*  MG  --   --  2.1  --   --   PHOS  --   --  2.4  --   --    Liver Function Tests:  Recent Labs Lab 08/17/14 0705 08/18/14 0719  AST 41* 25  ALT 10 6  ALKPHOS 129* 132*  BILITOT 1.2 0.9  PROT 6.3 6.1  ALBUMIN 2.9* 2.6*    Recent Labs Lab 08/17/14 1630  LIPASE 85*   No results for input(s): AMMONIA in the last 168 hours. CBC:  Recent Labs Lab 08/15/14 1948 08/16/14 0511 08/17/14 0705 08/18/14 0719 08/19/14 0432  WBC 19.3* 22.4* 21.4* 15.4* 12.7*  NEUTROABS 17.7*  --  19.4* 14.0*  --   HGB 14.8 14.1 12.8* 12.4* 12.0*  HCT 43.4 42.9 39.5 37.7* 36.3*  MCV 93.1 95.1 94.5 94.0 95.3  PLT 162 143* 150 132* 140*   Cardiac Enzymes: No results for input(s): CKTOTAL, CKMB, CKMBINDEX, TROPONINI in the last 168 hours. BNP (last 3 results) No results for input(s): BNP in the last 8760 hours.  ProBNP (last 3 results) No results for input(s): PROBNP in the last 8760 hours.  CBG:  Recent Labs Lab 08/16/14 2232  GLUCAP 134*    Recent Results (from the past 240 hour(s))  Culture, blood (routine x 2)     Status: None (Preliminary result)   Collection Time: 08/15/14  7:40 PM  Result Value Ref Range Status   Specimen Description BLOOD ARM RIGHT  Final   Special Requests BOTTLES DRAWN AEROBIC AND ANAEROBIC 5CC  Final   Culture   Final           BLOOD CULTURE RECEIVED NO GROWTH TO DATE CULTURE WILL BE HELD FOR 5 DAYS BEFORE ISSUING A FINAL NEGATIVE REPORT Performed at [facility]    Report Status PENDING  Incomplete  Culture, blood (routine x 2)     Status: None (Preliminary result)   Collection Time: 08/15/14  7:45 PM  Result Value Ref Range Status   Specimen Description BLOOD HAND RIGHT  Final   Special Requests BOTTLES DRAWN AEROBIC ONLY 3CC  Final   Culture    Final           BLOOD CULTURE RECEIVED NO GROWTH TO DATE CULTURE WILL BE HELD FOR 5 DAYS BEFORE ISSUING A FINAL NEGATIVE REPORT Performed at [facility]    Report Status PENDING  Incomplete  Urine culture     Status: None   Collection Time: 08/15/14  8:45 PM  Result Value Ref Range Status   Specimen Description URINE, CATHETERIZED  Final   Special Requests NONE  Final   Colony Count NO GROWTH Performed at [facility]   Final   Culture NO GROWTH Performed at [facility]   Final   Report Status 08/17/2014 FINAL  Final  Culture, blood (routine x 2)     Status: None (Preliminary result)   Collection Time: 08/20/14  9:02 AM  Result Value Ref Range Status   Specimen Description BLOOD RIGHT HAND  Final   Special Requests BOTTLES DRAWN AEROBIC AND ANAEROBIC 4CC  Final   Culture   Final  BLOOD CULTURE RECEIVED NO GROWTH TO DATE CULTURE WILL BE HELD FOR 5 DAYS BEFORE ISSUING A FINAL NEGATIVE REPORT Performed at [facility]    Report Status PENDING  Incomplete  Culture, blood (routine x 2)     Status: None (Preliminary result)   Collection Time: 08/20/14  9:07 AM  Result Value Ref Range Status   Specimen Description BLOOD LEFT ANTECUBITAL  Final   Special Requests BOTTLES DRAWN AEROBIC AND ANAEROBIC 4CC  Final   Culture   Final           BLOOD CULTURE RECEIVED NO GROWTH TO DATE CULTURE WILL BE HELD FOR 5 DAYS BEFORE ISSUING A FINAL NEGATIVE REPORT Performed at [facility]    Report Status PENDING  Incomplete     Studies: Dg Chest Port 1 View  08/20/2014   CLINICAL DATA:  Cough and weakness  EXAM: PORTABLE CHEST - 1 VIEW  COMPARISON:  08/18/2014  FINDINGS: Cardiac shadow is within normal limits. Bibasilar atelectatic changes are again noted. No focal infiltrate  IMPRESSION: Stable bibasilar atelectatic changes. No new focal abnormality is seen.   Electronically Signed   By: Inez Catalina M.D.   On: 08/20/2014 11:34   Dg Swallowing  Func-speech Pathology  08/21/2014    Objective Swallowing Evaluation:    Patient Details  Name: Paul Callahan MRN: [id number] Date of Birth: [birth year]  Today's Date: 08/21/2014 Time: SLP Start Time (ACUTE ONLY): 1047-SLP Stop Time (ACUTE ONLY): 1103 SLP Time Calculation (min) (ACUTE ONLY): 16 min  Past Medical History:  Past Medical History  Diagnosis Date  . Parkinson disease     'tremors are worsened at this time than before"  . Dementia   . Depression   . Bowel obstruction 12/15/2011  . Neuromuscular disorder     hx of parkinsons  . Arthritis   . Sepsis 05/20/2014  . Cancer     hx of colon cancer(surgery only)  . Impaired mobility     since hospital visit 11'15 overall weakness of legs -ambulates with  walker" with assist   Past Surgical History:  Past Surgical History  Procedure Laterality Date  . Cataracts Bilateral     '94,'95  . Hernia repair    . Back surgery      x 5  . Frozen shoulder    . Vasectomy    . Total shoulder arthroplasty Left   . Lumbar fusion    . Spinal cord stimulator implant    . Colon surgery      '88 colon resection(cancer)  . Eye surgery      strabismus  repair '58, laser surgery also after cataract surgery  . Eus N/A 06/18/2014    Procedure: ESOPHAGEAL ENDOSCOPIC ULTRASOUND (EUS) RADIAL;  Surgeon:  Arta Silence, MD;  Location: [facility] END[facility];  Service: Endoscopy;   Laterality: N/A;   HPI:  HPI: Pt is an 81 y.o. male who was brought to the ED due to complaints of  ABD pain since last night along with weakness and increased somnolence and  fevers today.He had been placed on Amoxicillin initially for a UTI, which  was changed to Augmentin/Clavulinic Acid 500 mg BID 1 days ago for a UT  when his culture results returned.He had been having Urinary Frequency  and urgency for the past 2-3 weeks and was seen by his PCP and started on  an antibiotic Rx.  No Data Recorded  Assessment / Plan / Recommendation CHL IP CLINICAL IMPRESSIONS 08/21/2014  Dysphagia Diagnosis  Mild pharyngeal phase dysphagia   Clinical impression Pt presents mild pharyngeal phase dysphagia. Pt has a  mild delay in swallow initiation to the level of the valleculae. Pt did  not penetrate or aspirate when asked to take large consecutive sips of  thin liquids. Recommend pt upgrade to dys 3/ thin liquid diet. Recommend  pt stay upright during meals; pt tends to lean towards his right side when  in a seated position, which places him at risk for aspiration. Pt coughed  during session, however, cough not related to aspiration or penetration.  Speech will follow-up for diet tolerance.       CHL IP TREATMENT RECOMMENDATION 08/21/2014  Treatment Plan Recommendations Therapy as outlined in treatment plan below      CHL IP DIET RECOMMENDATION 08/21/2014  Diet Recommendations Dysphagia 3 (Mechanical Soft);Thin liquid  Liquid Administration via Cup;Straw  Medication Administration Whole meds with puree  Compensations Slow rate;Small sips/bites  Postural Changes and/or Swallow Maneuvers Seated upright 90 degrees     CHL IP OTHER RECOMMENDATIONS 08/21/2014  Recommended Consults (None)  Oral Care Recommendations Oral care BID  Other Recommendations (None)     CHL IP FOLLOW UP RECOMMENDATIONS 08/21/2014  Follow up Recommendations Skilled Nursing facility     [facility] IP FREQUENCY AND DURATION 08/21/2014  Speech Therapy Frequency (ACUTE ONLY) min 2x/week  Treatment Duration 2 weeks     Pertinent Vitals/Pain NA    SLP Swallow Goals No flowsheet data found.  No flowsheet data found.    CHL IP REASON FOR REFERRAL 08/21/2014  Reason for Referral Objectively evaluate swallowing function     CHL IP ORAL PHASE 08/21/2014  Lips (None)  Tongue (None)  Mucous membranes (None)  Nutritional status (None)  Other (None)  Oxygen therapy (None)  Oral Phase WFL  Oral - Pudding Teaspoon (None)  Oral - Pudding Cup (None)  Oral - Honey Teaspoon (None)  Oral - Honey Cup (None)  Oral - Honey Syringe (None)  Oral - Nectar Teaspoon (None)  Oral - Nectar Cup (None)  Oral - Nectar Straw (None)   Oral - Nectar Syringe (None)  Oral - Ice Chips (None)  Oral - Thin Teaspoon (None)  Oral - Thin Cup (None)  Oral - Thin Straw (None)  Oral - Thin Syringe (None)  Oral - Puree (None)  Oral - Mechanical Soft (None)  Oral - Regular (None)  Oral - Multi-consistency (None)  Oral - Pill (None)  Oral Phase - Comment (None)      CHL IP PHARYNGEAL PHASE 08/21/2014  Pharyngeal Phase Impaired  Pharyngeal - Pudding Teaspoon (None)  Penetration/Aspiration details (pudding teaspoon) (None)  Pharyngeal - Pudding Cup (None)  Penetration/Aspiration details (pudding cup) (None)  Pharyngeal - Honey Teaspoon (None)  Penetration/Aspiration details (honey teaspoon) (None)  Pharyngeal - Honey Cup (None)  Penetration/Aspiration details (honey cup) (None)  Pharyngeal - Honey Syringe (None)  Penetration/Aspiration details (honey syringe) (None)  Pharyngeal - Nectar Teaspoon (None)  Penetration/Aspiration details (nectar teaspoon) (None)  Pharyngeal - Nectar Cup Delayed swallow initiation  Penetration/Aspiration details (nectar cup) (None)  Pharyngeal - Nectar Straw (None)  Penetration/Aspiration details (nectar straw) (None)  Pharyngeal - Nectar Syringe (None)  Penetration/Aspiration details (nectar syringe) (None)  Pharyngeal - Ice Chips (None)  Penetration/Aspiration details (ice chips) (None)  Pharyngeal - Thin Teaspoon (None)  Penetration/Aspiration details (thin teaspoon) (None)  Pharyngeal - Thin Cup Delayed swallow initiation  Penetration/Aspiration details (thin cup) (None)  Pharyngeal - Thin Straw Delayed swallow initiation  Penetration/Aspiration details (thin straw) (  None)  Pharyngeal - Thin Syringe (None)  Penetration/Aspiration details (thin syringe') (None)  Pharyngeal - Puree Delayed swallow initiation  Penetration/Aspiration details (puree) (None)  Pharyngeal - Mechanical Soft (None)  Penetration/Aspiration details (mechanical soft) (None)  Pharyngeal - Regular Delayed swallow initiation  Penetration/Aspiration details  (regular) (None)  Pharyngeal - Multi-consistency (None)  Penetration/Aspiration details (multi-consistency) (None)  Pharyngeal - Pill Delayed swallow initiation  Penetration/Aspiration details (pill) (None)  Pharyngeal Comment (None)     No flowsheet data found.  No flowsheet data found.  Rodena Goldmann, SLP student Supervised and reviewed by Herbie Baltimore MA CCC-SLP        DeBlois, Katherene Ponto 08/21/2014, 12:04 PM     Scheduled Meds: . amLODipine  5 mg Oral Daily  . beta carotene w/minerals  2 tablet Oral Daily  . carbidopa-levodopa  1 tablet Oral QHS  . carbidopa-levodopa  3 tablet Oral TID  . cholecalciferol  2,000 Units Oral Daily  . DULoxetine  60 mg Oral BID  . enoxaparin (LOVENOX) injection  40 mg Subcutaneous Q24H  . ertapenem  1 g Intravenous Q24H  . multivitamin with minerals  1 tablet Oral Daily  . pregabalin  150 mg Oral BID  . Rivastigmine  13.3 mg Transdermal Daily  . zinc sulfate  220 mg Oral q1800   Continuous Infusions:    Time spent: 25 minutes    [facility]  [facility] Pager [phone]. If 7PM-7AM, please contact night-coverage at [email or web address], password [facility] 08/21/2014, 1:45 PM  LOS: 6 days

## 2014-08-22 LAB — CULTURE, BLOOD (ROUTINE X 2)
Culture: NO GROWTH
Culture: NO GROWTH

## 2014-08-22 LAB — COMPREHENSIVE METABOLIC PANEL
ALK PHOS: 138 U/L — AB (ref 39–117)
ALT: 11 U/L (ref 0–53)
ANION GAP: 9 (ref 5–15)
AST: 29 U/L (ref 0–37)
Albumin: 2.4 g/dL — ABNORMAL LOW (ref 3.5–5.2)
BUN: 22 mg/dL (ref 6–23)
CALCIUM: 8.3 mg/dL — AB (ref 8.4–10.5)
CO2: 30 mmol/L (ref 19–32)
Chloride: 100 mmol/L (ref 96–112)
Creatinine, Ser: 1.03 mg/dL (ref 0.50–1.35)
GFR calc non Af Amer: 63 mL/min — ABNORMAL LOW (ref >90)
GFR, EST AFRICAN AMERICAN: 73 mL/min — AB (ref >90)
Glucose, Bld: 149 mg/dL — ABNORMAL HIGH (ref 70–99)
Potassium: 3.6 mmol/L (ref 3.5–5.1)
Sodium: 139 mmol/L (ref 135–145)
TOTAL PROTEIN: 6 g/dL (ref 6.0–8.3)
Total Bilirubin: 0.8 mg/dL (ref 0.3–1.2)

## 2014-08-22 LAB — CBC
HCT: 36.5 % — ABNORMAL LOW (ref 39.0–52.0)
Hemoglobin: 12.1 g/dL — ABNORMAL LOW (ref 13.0–17.0)
MCH: 31 pg (ref 26.0–34.0)
MCHC: 33.2 g/dL (ref 30.0–36.0)
MCV: 93.6 fL (ref 78.0–100.0)
Platelets: 259 10*3/uL (ref 150–400)
RBC: 3.9 MIL/uL — ABNORMAL LOW (ref 4.22–5.81)
RDW: 14.7 % (ref 11.5–15.5)
WBC: 13.1 10*3/uL — AB (ref 4.0–10.5)

## 2014-08-22 MED ORDER — CETYLPYRIDINIUM CHLORIDE 0.05 % MT LIQD
7.0000 mL | Freq: Two times a day (BID) | OROMUCOSAL | Status: DC
Start: 2014-08-22 — End: 2014-08-26
  Administered 2014-08-22 – 2014-08-26 (×9): 7 mL via OROMUCOSAL

## 2014-08-22 MED ORDER — SODIUM CHLORIDE 0.9 % IV SOLN
INTRAVENOUS | Status: AC
  Administered 2014-08-22: 18:00:00 via INTRAVENOUS

## 2014-08-22 NOTE — Progress Notes (Addendum)
[facility] PROGRESS NOTE  Paul Callahan YHC:[id number] DOB: [birth year] DOA: 08/15/2014 PCP: [name], MD  Summary Appreciate ID. Paul Callahan is an 81 year old male with Parkinson's disease/dementia multiple drug allergies, among other medical problems, who came in with generalized weakness associated with shortness of breath and was found to be septic with white count of 19,000 despite outpatient antibiotics for close to a week(?Augmentin) given for UTI. Chest x-ray was unrevealing but this could be because patient was dry at admission. There is element of aspiration suggested by swallow evaluation done on 08/18/2014 SLP. Patient was hospitalized in November 2015 and at that time blood cultures grew ENTEROBACTER AMNIGENUS. Urine culture/blood cultures so far unrevealing. [place] improved to 15400 on Invanz. CT chest abdomen and pelvis showed "Bilateral lower lobe atelectasis versus infiltrates. Mild acute pancreatitis. 2.1 cm indeterminate low-attenuation lesion at the pancreatic neck has decreased in size from 2.8 cm previously. Differential diagnosis includes small pseudocyst, peripancreatic lymphadenopathy, or pancreatic neoplasm. Consider continued followup by CT versus abdomen MRI without and with contrast if patient can cooperate with breath holding". I spoke with [name] regarding EUS results-benign. Patient will need short-term rehabilitation placement. He should be on dysphagia diet per speech therapy recommendations. He feels better today. He responds to questions appropriately but remains lethargic. His white count has improved to 12,700. We'll therefore continue current antibiotics and hopefully transfusion to SNF in the next few days.   Plan Sepsis/Aspiration pneumonia?/Atelectasis/UTI (lower urinary tract infection)?/Leukocytosis  Culture no growth so far, day #6  of ertapenem, infectious disease following, he is to complete his antibiotics on 2/20  Continue   Bronchodilators/oxygen supplementation as needed/incentive spirometry  Antibiotics per infectious diseases- on Invanz to be stopped on 2/20  Continue dysphagia 3 diet with nectar thick liquids  Repeat chest x-ray 2/17 obtained-stable by basilar changes, no new focal abnormality  Blood culture, urine culture remain negative since admission  As per family pt more lethargic today will repeat cbc, cmp, and gently hydrate to see if he improves    Acute encephalopathy/Weakness/Dementia without behavioral disturbance/Parkinsonism  Continue Sinemet, Cymbalta, Lyrica, rivastigmine  Physical therapy recommends SNF  Reviewed speech therapy recommendations-dysphagia 3 diet with nectar thick liquids   Mild acute pancreatitis/pancreatic lesion  No acute changes   Essential hypertension  Norvasc, adjust to optimize control  Continue prn  hydralazine  Code Status: Full Family Communication: Updated patient's wife at bedside. Disposition Plan: SNF. Disposition per infectious disease recommendations, SNF   Consultants:  ID  Procedures:    Antibiotics:  Invanz 08/17/14>  HPI/Subjective: Patient has a fever of 100.7 this morning however no focal symptoms, no shortness of breath or cough no diarrhea  Objective: Filed Vitals:   08/22/14 1242  BP: 128/53  Pulse: 74  Temp: 99.4 F (37.4 C)  Resp: 17    Intake/Output Summary (Last 24 hours) at 08/22/14 1323 Last data filed at 08/22/14 1028  Gross per 24 hour  Intake    300 ml  Output    151 ml  Net    149 ml   Filed Weights   08/19/14 2326 08/20/14 2048 08/21/14 2120  Weight: 84.6 kg (186 lb 8.2 oz) 84.7 kg (186 lb 11.7 oz) 71.3 kg (157 lb 3 oz)    Exam:  General: He is more alert today and answer simple questions. Skin: No rash. IV site normal Lungs: Clear. He has a loose cough Cor: Regular S1 and S2 with no murmur Abdomen: Soft and nontender. No diarrhea reported by  nurse  Data Reviewed: Basic Metabolic  Panel:  Recent Labs Lab 08/15/14 1948 08/16/14 0511 08/17/14 0705 08/18/14 0719 08/19/14 0432  NA 136 138 137 138 139  K 4.5 5.2* 4.0 3.9 3.7  CL 105 104 103 108 104  CO2 24 26 25 23 23   GLUCOSE 128* 89 127* 137* 140*  BUN 27* 28* 26* 20 19  CREATININE 1.09 1.19 1.27 0.99 0.92  CALCIUM 8.9 8.7 8.6 8.1* 8.3*  MG  --   --  2.1  --   --   PHOS  --   --  2.4  --   --    Liver Function Tests:  Recent Labs Lab 08/17/14 0705 08/18/14 0719  AST 41* 25  ALT 10 6  ALKPHOS 129* 132*  BILITOT 1.2 0.9  PROT 6.3 6.1  ALBUMIN 2.9* 2.6*    Recent Labs Lab 08/17/14 1630  LIPASE 85*   No results for input(s): AMMONIA in the last 168 hours. CBC:  Recent Labs Lab 08/15/14 1948 08/16/14 0511 08/17/14 0705 08/18/14 0719 08/19/14 0432  WBC 19.3* 22.4* 21.4* 15.4* 12.7*  NEUTROABS 17.7*  --  19.4* 14.0*  --   HGB 14.8 14.1 12.8* 12.4* 12.0*  HCT 43.4 42.9 39.5 37.7* 36.3*  MCV 93.1 95.1 94.5 94.0 95.3  PLT 162 143* 150 132* 140*   Cardiac Enzymes: No results for input(s): CKTOTAL, CKMB, CKMBINDEX, TROPONINI in the last 168 hours. BNP (last 3 results) No results for input(s): BNP in the last 8760 hours.  ProBNP (last 3 results) No results for input(s): PROBNP in the last 8760 hours.  CBG:  Recent Labs Lab 08/16/14 2232  GLUCAP 134*    Recent Results (from the past 240 hour(s))  Culture, blood (routine x 2)     Status: None   Collection Time: 08/15/14  7:40 PM  Result Value Ref Range Status   Specimen Description BLOOD ARM RIGHT  Final   Special Requests BOTTLES DRAWN AEROBIC AND ANAEROBIC 5CC  Final   Culture   Final    NO GROWTH 5 DAYS Performed at [facility]    Report Status 08/22/2014 FINAL  Final  Culture, blood (routine x 2)     Status: None   Collection Time: 08/15/14  7:45 PM  Result Value Ref Range Status   Specimen Description BLOOD HAND RIGHT  Final   Special Requests BOTTLES DRAWN AEROBIC ONLY 3CC  Final   Culture   Final    NO  GROWTH 5 DAYS Performed at [facility]    Report Status 08/22/2014 FINAL  Final  Urine culture     Status: None   Collection Time: 08/15/14  8:45 PM  Result Value Ref Range Status   Specimen Description URINE, CATHETERIZED  Final   Special Requests NONE  Final   Colony Count NO GROWTH Performed at [facility]   Final   Culture NO GROWTH Performed at [facility]   Final   Report Status 08/17/2014 FINAL  Final  Culture, blood (routine x 2)     Status: None (Preliminary result)   Collection Time: 08/20/14  9:02 AM  Result Value Ref Range Status   Specimen Description BLOOD RIGHT HAND  Final   Special Requests BOTTLES DRAWN AEROBIC AND ANAEROBIC 4CC  Final   Culture   Final           BLOOD CULTURE RECEIVED NO GROWTH TO DATE CULTURE WILL BE HELD FOR 5 DAYS BEFORE ISSUING A FINAL  NEGATIVE REPORT Performed at [facility]    Report Status PENDING  Incomplete  Culture, blood (routine x 2)     Status: None (Preliminary result)   Collection Time: 08/20/14  9:07 AM  Result Value Ref Range Status   Specimen Description BLOOD LEFT ANTECUBITAL  Final   Special Requests BOTTLES DRAWN AEROBIC AND ANAEROBIC 4CC  Final   Culture   Final           BLOOD CULTURE RECEIVED NO GROWTH TO DATE CULTURE WILL BE HELD FOR 5 DAYS BEFORE ISSUING A FINAL NEGATIVE REPORT Performed at [facility]    Report Status PENDING  Incomplete     Studies: Dg Swallowing Func-speech Pathology  08/21/2014    Objective Swallowing Evaluation:    Patient Details  Name: Paul Callahan MRN: [id number] Date of Birth: 1923/10/30  Today's Date: 08/21/2014 Time: SLP Start Time (ACUTE ONLY): 1047-SLP Stop Time (ACUTE ONLY): 1103 SLP Time Calculation (min) (ACUTE ONLY): 16 min  Past Medical History:  Past Medical History  Diagnosis Date  . Parkinson disease     'tremors are worsened at this time than before"  . Dementia   . Depression   . Bowel obstruction 12/15/2011  . Neuromuscular disorder      hx of parkinsons  . Arthritis   . Sepsis 05/20/2014  . Cancer     hx of colon cancer(surgery only)  . Impaired mobility     since hospital visit 11'15 overall weakness of legs -ambulates with  walker" with assist   Past Surgical History:  Past Surgical History  Procedure Laterality Date  . Cataracts Bilateral     '94,'95  . Hernia repair    . Back surgery      x 5  . Frozen shoulder    . Vasectomy    . Total shoulder arthroplasty Left   . Lumbar fusion    . Spinal cord stimulator implant    . Colon surgery      '88 colon resection(cancer)  . Eye surgery      strabismus  repair '58, laser surgery also after cataract surgery  . Eus N/A 06/18/2014    Procedure: ESOPHAGEAL ENDOSCOPIC ULTRASOUND (EUS) RADIAL;  Surgeon:  Arta Silence, MD;  Location: [facility];  Service: Endoscopy;   Laterality: N/A;   HPI:  HPI: Pt is an 81 y.o. male who was brought to the ED due to complaints of  ABD pain since last night along with weakness and increased somnolence and  fevers today.He had been placed on Amoxicillin initially for a UTI, which  was changed to Augmentin/Clavulinic Acid 500 mg BID 1 days ago for a UT  when his culture results returned.He had been having Urinary Frequency  and urgency for the past 2-3 weeks and was seen by his PCP and started on  an antibiotic Rx.  No Data Recorded  Assessment / Plan / Recommendation CHL IP CLINICAL IMPRESSIONS 08/21/2014  Dysphagia Diagnosis Mild pharyngeal phase dysphagia  Clinical impression Pt presents mild pharyngeal phase dysphagia. Pt has a  mild delay in swallow initiation to the level of the valleculae. Pt did  not penetrate or aspirate when asked to take large consecutive sips of  thin liquids. Recommend pt upgrade to dys 3/ thin liquid diet. Recommend  pt stay upright during meals; pt tends to lean towards his right side when  in a seated position, which places him at risk for aspiration. Pt coughed  during session, however, cough not  related to aspiration or penetration.   Speech will follow-up for diet tolerance.       CHL IP TREATMENT RECOMMENDATION 08/21/2014  Treatment Plan Recommendations Therapy as outlined in treatment plan below      CHL IP DIET RECOMMENDATION 08/21/2014  Diet Recommendations Dysphagia 3 (Mechanical Soft);Thin liquid  Liquid Administration via Cup;Straw  Medication Administration Whole meds with puree  Compensations Slow rate;Small sips/bites  Postural Changes and/or Swallow Maneuvers Seated upright 90 degrees     CHL IP OTHER RECOMMENDATIONS 08/21/2014  Recommended Consults (None)  Oral Care Recommendations Oral care BID  Other Recommendations (None)     CHL IP FOLLOW UP RECOMMENDATIONS 08/21/2014  Follow up Recommendations Skilled Nursing facility     [facility] IP FREQUENCY AND DURATION 08/21/2014  Speech Therapy Frequency (ACUTE ONLY) min 2x/week  Treatment Duration 2 weeks     Pertinent Vitals/Pain NA    SLP Swallow Goals No flowsheet data found.  No flowsheet data found.    CHL IP REASON FOR REFERRAL 08/21/2014  Reason for Referral Objectively evaluate swallowing function     CHL IP ORAL PHASE 08/21/2014  Lips (None)  Tongue (None)  Mucous membranes (None)  Nutritional status (None)  Other (None)  Oxygen therapy (None)  Oral Phase WFL  Oral - Pudding Teaspoon (None)  Oral - Pudding Cup (None)  Oral - Honey Teaspoon (None)  Oral - Honey Cup (None)  Oral - Honey Syringe (None)  Oral - Nectar Teaspoon (None)  Oral - Nectar Cup (None)  Oral - Nectar Straw (None)  Oral - Nectar Syringe (None)  Oral - Ice Chips (None)  Oral - Thin Teaspoon (None)  Oral - Thin Cup (None)  Oral - Thin Straw (None)  Oral - Thin Syringe (None)  Oral - Puree (None)  Oral - Mechanical Soft (None)  Oral - Regular (None)  Oral - Multi-consistency (None)  Oral - Pill (None)  Oral Phase - Comment (None)      CHL IP PHARYNGEAL PHASE 08/21/2014  Pharyngeal Phase Impaired  Pharyngeal - Pudding Teaspoon (None)  Penetration/Aspiration details (pudding teaspoon) (None)  Pharyngeal - Pudding Cup (None)   Penetration/Aspiration details (pudding cup) (None)  Pharyngeal - Honey Teaspoon (None)  Penetration/Aspiration details (honey teaspoon) (None)  Pharyngeal - Honey Cup (None)  Penetration/Aspiration details (honey cup) (None)  Pharyngeal - Honey Syringe (None)  Penetration/Aspiration details (honey syringe) (None)  Pharyngeal - Nectar Teaspoon (None)  Penetration/Aspiration details (nectar teaspoon) (None)  Pharyngeal - Nectar Cup Delayed swallow initiation  Penetration/Aspiration details (nectar cup) (None)  Pharyngeal - Nectar Straw (None)  Penetration/Aspiration details (nectar straw) (None)  Pharyngeal - Nectar Syringe (None)  Penetration/Aspiration details (nectar syringe) (None)  Pharyngeal - Ice Chips (None)  Penetration/Aspiration details (ice chips) (None)  Pharyngeal - Thin Teaspoon (None)  Penetration/Aspiration details (thin teaspoon) (None)  Pharyngeal - Thin Cup Delayed swallow initiation  Penetration/Aspiration details (thin cup) (None)  Pharyngeal - Thin Straw Delayed swallow initiation  Penetration/Aspiration details (thin straw) (None)  Pharyngeal - Thin Syringe (None)  Penetration/Aspiration details (thin syringe') (None)  Pharyngeal - Puree Delayed swallow initiation  Penetration/Aspiration details (puree) (None)  Pharyngeal - Mechanical Soft (None)  Penetration/Aspiration details (mechanical soft) (None)  Pharyngeal - Regular Delayed swallow initiation  Penetration/Aspiration details (regular) (None)  Pharyngeal - Multi-consistency (None)  Penetration/Aspiration details (multi-consistency) (None)  Pharyngeal - Pill Delayed swallow initiation  Penetration/Aspiration details (pill) (None)  Pharyngeal Comment (None)     No flowsheet data found.  No flowsheet data found.  Maida Bermudez-Bosch,  SLP student Supervised and reviewed by Herbie Baltimore MA CCC-SLP        DeBlois, Katherene Ponto 08/21/2014, 12:04 PM     Scheduled Meds: . amLODipine  5 mg Oral Daily  . antiseptic oral rinse  7 mL Mouth  Rinse BID  . beta carotene w/minerals  2 tablet Oral Daily  . carbidopa-levodopa  1 tablet Oral QHS  . carbidopa-levodopa  3 tablet Oral TID  . cholecalciferol  2,000 Units Oral Daily  . DULoxetine  60 mg Oral BID  . enoxaparin (LOVENOX) injection  40 mg Subcutaneous Q24H  . ertapenem  1 g Intravenous Q24H  . multivitamin with minerals  1 tablet Oral Daily  . pregabalin  150 mg Oral BID  . Rivastigmine  13.3 mg Transdermal Daily  . zinc sulfate  220 mg Oral q1800   Continuous Infusions:    Time spent: 25 minutes    [facility]  [facility] Pager [phone]. If 7PM-7AM, please contact night-coverage at [email or web address], password [facility] 08/22/2014, 1:23 PM  LOS: 7 days

## 2014-08-22 NOTE — Progress Notes (Signed)
Patient ID: Paul Callahan, male   DOB: 1923/12/29, 81 y.o.   MRN: [id number]         [place] for Infectious Disease    Date of Admission:  08/15/2014   Total days of antibiotics 14        Day 6 ertapenem         Principal Problem:   Sepsis Active Problems:   Parkinsonism   Dementia without behavioral disturbance   UTI (lower urinary tract infection)   Weakness   Leukocytosis   Acute encephalopathy   Blood poisoning   Enterobacter sepsis   Aspiration pneumonia   Atelectasis   Pancreatic lesion   Pancreatitis, acute   Dementia with behavioral disturbance   Dysphagia   Failure to thrive in adult   Essential hypertension   Pyrexia   . amLODipine  5 mg Oral Daily  . antiseptic oral rinse  7 mL Mouth Rinse BID  . beta carotene w/minerals  2 tablet Oral Daily  . carbidopa-levodopa  1 tablet Oral QHS  . carbidopa-levodopa  3 tablet Oral TID  . cholecalciferol  2,000 Units Oral Daily  . DULoxetine  60 mg Oral BID  . enoxaparin (LOVENOX) injection  40 mg Subcutaneous Q24H  . ertapenem  1 g Intravenous Q24H  . multivitamin with minerals  1 tablet Oral Daily  . pregabalin  150 mg Oral BID  . Rivastigmine  13.3 mg Transdermal Daily  . zinc sulfate  220 mg Oral q1800    Subjective: He denies cough, shortness of breath, pain and diarrhea.  Review of Systems: Review of systems not obtained due to patient factors.  Past Medical History  Diagnosis Date  . Parkinson disease     'tremors are worsened at this time than before"  . Dementia   . Depression   . Bowel obstruction 12/15/2011  . Neuromuscular disorder     hx of parkinsons  . Arthritis   . Sepsis 05/20/2014  . Cancer     hx of colon cancer(surgery only)  . Impaired mobility     since hospital visit 11'15 overall weakness of legs -ambulates with walker" with assist    History  Substance Use Topics  . Smoking status: Former Research scientist (life sciences)  . Smokeless tobacco: Never Used  . Alcohol Use: No    History  reviewed. No pertinent family history. Allergies  Allergen Reactions  . Amantadines Other (See Comments)    Weakness in legs and hallucinations  . Fentanyl Other (See Comments)    Confusion and disorientation   . Gabapentin Other (See Comments)    Weakness in legs and hallucinations  . Oxycodone Other (See Comments)    Confusion and disorientation   . Phenergan [Promethazine Hcl] Other (See Comments)    Confusion and disorientation   . Vancomycin Itching, Rash and Other (See Comments)    Red man's syndrome   . Ciprofloxacin Diarrhea and Rash    OBJECTIVE: Blood pressure 125/108, pulse 78, temperature 99.2 F (37.3 C), temperature source Oral, resp. rate 17, height 5\' 7"  (1.702 m), weight 157 lb 3 oz (71.3 kg), SpO2 95 %. General: He is asleep but arouses easily and answers simple questions appropriately Skin: No rash Lungs: Coarse upper airway noise Cor: Regular S1 and S2 with no murmur  Abdomen: soft and nontender  Lab Results Lab Results  Component Value Date   WBC 12.7* 08/19/2014   HGB 12.0* 08/19/2014   HCT 36.3* 08/19/2014   MCV 95.3 08/19/2014   PLT  140* 08/19/2014    Lab Results  Component Value Date   CREATININE 0.92 08/19/2014   BUN 19 08/19/2014   NA 139 08/19/2014   K 3.7 08/19/2014   CL 104 08/19/2014   CO2 23 08/19/2014    Lab Results  Component Value Date   ALT 6 08/18/2014   AST 25 08/18/2014   ALKPHOS 132* 08/18/2014   BILITOT 0.9 08/18/2014     Microbiology: Recent Results (from the past 240 hour(s))  Culture, blood (routine x 2)     Status: None   Collection Time: 08/15/14  7:40 PM  Result Value Ref Range Status   Specimen Description BLOOD ARM RIGHT  Final   Special Requests BOTTLES DRAWN AEROBIC AND ANAEROBIC 5CC  Final   Culture   Final    NO GROWTH 5 DAYS Performed at [facility]    Report Status 08/22/2014 FINAL  Final  Culture, blood (routine x 2)     Status: None   Collection Time: 08/15/14  7:45 PM  Result Value  Ref Range Status   Specimen Description BLOOD HAND RIGHT  Final   Special Requests BOTTLES DRAWN AEROBIC ONLY 3CC  Final   Culture   Final    NO GROWTH 5 DAYS Performed at [facility]    Report Status 08/22/2014 FINAL  Final  Urine culture     Status: None   Collection Time: 08/15/14  8:45 PM  Result Value Ref Range Status   Specimen Description URINE, CATHETERIZED  Final   Special Requests NONE  Final   Colony Count NO GROWTH Performed at [facility]   Final   Culture NO GROWTH Performed at [facility]   Final   Report Status 08/17/2014 FINAL  Final  Culture, blood (routine x 2)     Status: None (Preliminary result)   Collection Time: 08/20/14  9:02 AM  Result Value Ref Range Status   Specimen Description BLOOD RIGHT HAND  Final   Special Requests BOTTLES DRAWN AEROBIC AND ANAEROBIC 4CC  Final   Culture   Final           BLOOD CULTURE RECEIVED NO GROWTH TO DATE CULTURE WILL BE HELD FOR 5 DAYS BEFORE ISSUING A FINAL NEGATIVE REPORT Performed at [facility]    Report Status PENDING  Incomplete  Culture, blood (routine x 2)     Status: None (Preliminary result)   Collection Time: 08/20/14  9:07 AM  Result Value Ref Range Status   Specimen Description BLOOD LEFT ANTECUBITAL  Final   Special Requests BOTTLES DRAWN AEROBIC AND ANAEROBIC 4CC  Final   Culture   Final           BLOOD CULTURE RECEIVED NO GROWTH TO DATE CULTURE WILL BE HELD FOR 5 DAYS BEFORE ISSUING A FINAL NEGATIVE REPORT Performed at [facility]    Report Status PENDING  Incomplete    Assessment: He seems to be defervesced seen and improving slowly on empiric antibiotic therapy for possible basilar pneumonia following treatment for recent Citrobacter UTI. I would recommend continuing ertapenem for 24 more hours and then stopping and observing off of antibiotics.  Plan: 1. Stop ertapenem tomorrow 2. Please call Dr. Lita Mains [phone]) for any infectious disease  questions this weekend  Michel Bickers, MD [facility] [place] [facility] [phone] pager   [phone] cell 08/22/2014, 10:07 AM

## 2014-08-22 NOTE — Progress Notes (Signed)
Speech Language Pathology Treatment: Dysphagia  Patient Details Name: JUANA MONTINI MRN: [id number] DOB: [birth year] Today's Date: 08/22/2014 Time: 9499-7182 SLP Time Calculation (min) (ACUTE ONLY): 20 min  Assessment / Plan / Recommendation Clinical Impression  F/u after yesterday's MBS.  Diet was advanced to dysphagia 3, thin liquids.  No aspiration observed during study; intermittent cough noted but not associated with penetration/aspiration.  Education completed with daughter today, who viewed portions of video.  Reviewed standard precautions, nature of Parkinsonian syndromes and impact upon swallow.  Discussed methods of safe eating and necessity of self-feeding as long as possible.  Consumed thin liquids today with no overt difficulty.  No further SLP f/u is warranted.  Goals met/education completed.    HPI HPI: Pt is an 81 y.o. male who was brought to the ED due to complaints of ABD pain since last night along with weakness and increased somnolence and fevers today.He had been placed on Amoxicillin initially for a UTI, which was changed to Augmentin/Clavulinic Acid 500 mg BID 1 days ago for a UT when his culture results returned.He had been having Urinary Frequency and urgency for the past 2-3 weeks and was seen by his PCP and started on an antibiotic Rx.   Pertinent Vitals Pain Assessment: No/denies pain  SLP Plan  All goals met    Recommendations Diet recommendations: Dysphagia 3 (mechanical soft);Thin liquid Liquids provided via: Straw;Cup Medication Administration: Whole meds with puree Supervision: Staff to assist with self feeding Compensations: Slow rate;Small sips/bites Postural Changes and/or Swallow Maneuvers: Seated upright 90 degrees              Oral Care Recommendations: Oral care BID Follow up Recommendations: Skilled Nursing facility Plan: All goals met   Kayson Bullis L. Tivis Ringer, [place] CCC/SLP Pager [phone]      Juan Quam Laurice 08/22/2014, 12:36 PM

## 2014-08-22 NOTE — Clinical Social Work Note (Signed)
CSW continuing to monitor patient's progress and he is not yet ready for d/c. CSW spoke with Claiborne Billings, admissions Mudlogger at [facility] today and provided update on patient. Claiborne Billings stated that the available bed she has will be filled and hopefully when patient ready for discharge, they will have availability.  CSW will continue to follow.   Lavoris Sparling Givens, MSW, LCSW Licensed Clinical Social Worker [place] [phone]

## 2014-08-22 NOTE — Progress Notes (Signed)
Wife concerned about pt receiving norvasc this week. Wife states pt's PCP states pt's "spike in BP can't be treated",  Noted documentation that pt's BP has been elevated and norvasc was started because of continued elevation of BP.   Wife feels BP med is causing pt to be so sleepy today. Sons at bedside also.  Unable to get wife to see reason for need of med. Will send message to Dr Allyson Sabal about concerns.

## 2014-08-23 MED ORDER — SODIUM CHLORIDE 0.9 % IV SOLN
1.0000 g | Freq: Once | INTRAVENOUS | Status: AC
  Administered 2014-08-23: 1 g via INTRAVENOUS
  Filled 2014-08-23: qty 1

## 2014-08-23 NOTE — Progress Notes (Addendum)
[facility] PROGRESS NOTE  Paul Callahan:[id number] DOB: 04-16-1924 DOA: 08/15/2014 PCP: [name], MD  Summary Appreciate ID. Paul Callahan is an 81 year old male with Parkinson's disease/dementia multiple drug allergies, among other medical problems, who came in with generalized weakness associated with shortness of breath and was found to be septic with white count of 19,000 despite outpatient antibiotics for close to a week(?Augmentin) given for UTI. Chest x-ray was unrevealing but this could be because patient was dry at admission. There is element of aspiration suggested by swallow evaluation done on 08/18/2014 SLP. Patient was hospitalized in November 2015 and at that time blood cultures grew ENTEROBACTER AMNIGENUS. Urine culture/blood cultures so far unrevealing. [place] improved to [address]400 on Invanz. CT chest abdomen and pelvis showed "Bilateral lower lobe atelectasis versus infiltrates. Mild acute pancreatitis. 2.1 cm indeterminate low-attenuation lesion at the pancreatic neck has decreased in size from 2.8 cm previously. Differential diagnosis includes small pseudocyst, peripancreatic lymphadenopathy, or pancreatic neoplasm. Consider continued followup by CT versus abdomen MRI without and with contrast if patient can cooperate with breath holding". I spoke with [name] regarding EUS results-benign. Patient will need short-term rehabilitation placement. He should be on dysphagia diet per speech therapy recommendations. He feels better today. He responds to questions appropriately but remains lethargic. His white count has improved to 13.1. Infectious disease making recommendations about his antibiotics   Plan Sepsis/Aspiration pneumonia?/Atelectasis/UTI (lower urinary tract infection)?/Leukocytosis  Culture no growth so far, day #7  of ertapenem, infectious disease following, he is to complete his antibiotics on 2/20, may need observation off antibiotics to see how he  does??  Continue  Bronchodilators/oxygen supplementation as needed/incentive spirometry  Continue dysphagia 3 diet with nectar thick liquids  Repeat chest x-ray 2/17 obtained-stable by basilar changes, no new focal abnormality  Blood culture, urine culture remain negative since admission  WBC count 13.1, relatively unchanged  Discussed with patient's wife by the bedside   Acute encephalopathy/Weakness/Dementia without behavioral disturbance/Parkinsonism  Continue Sinemet, Cymbalta, Lyrica, rivastigmine  Physical therapy recommends SNF, anticipate discharge Monday  Reviewed speech therapy recommendations-dysphagia 3 diet with nectar thick liquids   Mild acute pancreatitis/pancreatic lesion  No acute changes   Essential hypertension  Norvasc discontinued per family request, blood pressure stable  Continue prn  hydralazine  Code Status: Full Family Communication: Updated patient's wife at bedside. Disposition Plan: SNF. Likely Monday   Consultants:  ID  Procedures:    Antibiotics:  Invanz 08/17/14>  HPI/Subjective: Patient has a fever of 100.8 , denies chest pain, no shortness of breath or cough no diarrhea, wife is by the bedside  Objective: Filed Vitals:   08/23/14 1046  BP:   Pulse:   Temp: 99.8 F (37.7 C)  Resp:     Intake/Output Summary (Last 24 hours) at 08/23/14 1216 Last data filed at 08/23/14 0909  Gross per 24 hour  Intake   1445 ml  Output    751 ml  Net    694 ml   Filed Weights   08/20/14 2048 08/21/14 2120 08/22/14 2031  Weight: 84.7 kg (186 lb 11.7 oz) 71.3 kg (157 lb 3 oz) 81.9 kg (180 lb 8.9 oz)    Exam:  General: He is more alert today and answer simple questions. Does not know where he is Skin: No rash. IV site normal Lungs: Clear. He has a loose cough Cor: Regular S1 and S2 with no murmur Abdomen: Soft and nontender. No diarrhea reported by nurse  Data Reviewed: Basic Metabolic Panel:  Recent Labs Lab  08/17/14 0705 08/18/14 0719 08/19/14 0432 08/22/14 1745  NA 137 138 139 139  K 4.0 3.9 3.7 3.6  CL 103 108 104 100  CO2 25 23 23 30   GLUCOSE 127* 137* 140* 149*  BUN 26* 20 19 22   CREATININE 1.27 0.99 0.92 1.03  CALCIUM 8.6 8.1* 8.3* 8.3*  MG 2.1  --   --   --   PHOS 2.4  --   --   --    Liver Function Tests:  Recent Labs Lab 08/17/14 0705 08/18/14 0719 08/22/14 1745  AST 41* 25 29  ALT 10 6 11   ALKPHOS 129* 132* 138*  BILITOT 1.2 0.9 0.8  PROT 6.3 6.1 6.0  ALBUMIN 2.9* 2.6* 2.4*    Recent Labs Lab 08/17/14 1630  LIPASE 85*   No results for input(s): AMMONIA in the last 168 hours. CBC:  Recent Labs Lab 08/17/14 0705 08/18/14 0719 08/19/14 0432 08/22/14 1745  WBC 21.4* 15.4* 12.7* 13.1*  NEUTROABS 19.4* 14.0*  --   --   HGB 12.8* 12.4* 12.0* 12.1*  HCT 39.5 37.7* 36.3* 36.5*  MCV 94.5 94.0 95.3 93.6  PLT 150 132* 140* 259   Cardiac Enzymes: No results for input(s): CKTOTAL, CKMB, CKMBINDEX, TROPONINI in the last 168 hours. BNP (last 3 results) No results for input(s): BNP in the last 8760 hours.  ProBNP (last 3 results) No results for input(s): PROBNP in the last 8760 hours.  CBG:  Recent Labs Lab 08/16/14 2232  GLUCAP 134*    Recent Results (from the past 240 hour(s))  Culture, blood (routine x 2)     Status: None   Collection Time: 08/15/14  7:40 PM  Result Value Ref Range Status   Specimen Description BLOOD ARM RIGHT  Final   Special Requests BOTTLES DRAWN AEROBIC AND ANAEROBIC 5CC  Final   Culture   Final    NO GROWTH 5 DAYS Performed at [facility]    Report Status 08/22/2014 FINAL  Final  Culture, blood (routine x 2)     Status: None   Collection Time: 08/15/14  7:45 PM  Result Value Ref Range Status   Specimen Description BLOOD HAND RIGHT  Final   Special Requests BOTTLES DRAWN AEROBIC ONLY 3CC  Final   Culture   Final    NO GROWTH 5 DAYS Performed at [facility]    Report Status 08/22/2014 FINAL  Final   Urine culture     Status: None   Collection Time: 08/15/14  8:45 PM  Result Value Ref Range Status   Specimen Description URINE, CATHETERIZED  Final   Special Requests NONE  Final   Colony Count NO GROWTH Performed at [facility]   Final   Culture NO GROWTH Performed at [facility]   Final   Report Status 08/17/2014 FINAL  Final  Culture, blood (routine x 2)     Status: None (Preliminary result)   Collection Time: 08/20/14  9:02 AM  Result Value Ref Range Status   Specimen Description BLOOD RIGHT HAND  Final   Special Requests BOTTLES DRAWN AEROBIC AND ANAEROBIC 4CC  Final   Culture   Final           BLOOD CULTURE RECEIVED NO GROWTH TO DATE CULTURE WILL BE HELD FOR 5 DAYS BEFORE ISSUING A FINAL NEGATIVE REPORT Performed at [facility]    Report Status PENDING  Incomplete  Culture, blood (routine x 2)  Status: None (Preliminary result)   Collection Time: 08/20/14  9:07 AM  Result Value Ref Range Status   Specimen Description BLOOD LEFT ANTECUBITAL  Final   Special Requests BOTTLES DRAWN AEROBIC AND ANAEROBIC 4CC  Final   Culture   Final           BLOOD CULTURE RECEIVED NO GROWTH TO DATE CULTURE WILL BE HELD FOR 5 DAYS BEFORE ISSUING A FINAL NEGATIVE REPORT Performed at [facility]    Report Status PENDING  Incomplete     Studies: No results found.  Scheduled Meds: . antiseptic oral rinse  7 mL Mouth Rinse BID  . beta carotene w/minerals  2 tablet Oral Daily  . carbidopa-levodopa  1 tablet Oral QHS  . carbidopa-levodopa  3 tablet Oral TID  . cholecalciferol  2,000 Units Oral Daily  . DULoxetine  60 mg Oral BID  . enoxaparin (LOVENOX) injection  40 mg Subcutaneous Q24H  . ertapenem  1 g Intravenous Q24H  . multivitamin with minerals  1 tablet Oral Daily  . pregabalin  150 mg Oral BID  . Rivastigmine  13.3 mg Transdermal Daily  . zinc sulfate  220 mg Oral q1800   Continuous Infusions:    Time spent: 25  minutes    [facility]  [facility] Pager [phone]. If 7PM-7AM, please contact night-coverage at [email or web address], password [facility] 08/23/2014, 12:16 PM  LOS: 8 days

## 2014-08-24 ENCOUNTER — Inpatient Hospital Stay (HOSPITAL_COMMUNITY): Payer: Medicare Other

## 2014-08-24 ENCOUNTER — Encounter (HOSPITAL_COMMUNITY): Payer: Self-pay | Admitting: Radiology

## 2014-08-24 DIAGNOSIS — J690 Pneumonitis due to inhalation of food and vomit: Secondary | ICD-10-CM

## 2014-08-24 DIAGNOSIS — K868 Other specified diseases of pancreas: Secondary | ICD-10-CM

## 2014-08-24 DIAGNOSIS — B9689 Other specified bacterial agents as the cause of diseases classified elsewhere: Secondary | ICD-10-CM

## 2014-08-24 DIAGNOSIS — R5081 Fever presenting with conditions classified elsewhere: Secondary | ICD-10-CM

## 2014-08-24 LAB — AMYLASE: AMYLASE: 28 U/L (ref 0–105)

## 2014-08-24 LAB — LIPASE, BLOOD: LIPASE: 38 U/L (ref 11–59)

## 2014-08-24 MED ORDER — IOHEXOL 300 MG/ML  SOLN: 25.0000 mL | INTRAMUSCULAR | Status: AC

## 2014-08-24 MED ORDER — IOHEXOL 300 MG/ML  SOLN
100.0000 mL | Freq: Once | INTRAMUSCULAR | Status: AC | PRN
  Administered 2014-08-24: 100 mL via INTRAVENOUS

## 2014-08-24 MED ORDER — SODIUM CHLORIDE 0.9 % IV SOLN
INTRAVENOUS | Status: DC
  Administered 2014-08-24 – 2014-08-26 (×2): via INTRAVENOUS

## 2014-08-24 NOTE — Plan of Care (Signed)
Problem: Phase I Progression Outcomes Goal: OOB as tolerated unless otherwise ordered Outcome: Not Progressing Patient is bedrest

## 2014-08-24 NOTE — Progress Notes (Addendum)
[facility] PROGRESS NOTE  Paul Callahan ZOX:[id number] DOB: 21-Jan-1924 DOA: 08/15/2014 PCP: [name], MD  Summary Appreciate ID. Paul Callahan is an 81 year old male with Parkinson's disease/dementia multiple drug allergies, among other medical problems, who came in with generalized weakness associated with shortness of breath and was found to be septic with white count of 19,000 despite outpatient antibiotics for close to a week(?Augmentin) given for UTI. Chest x-ray was unrevealing but this could be because patient was dry at admission. There is element of aspiration suggested by swallow evaluation done on 08/18/2014 SLP. Patient was hospitalized in November 2015 and at that time blood cultures grew ENTEROBACTER AMNIGENUS. Urine culture/blood cultures so far unrevealing. [place] to [address]400 on Invanz. CT chest abdomen and pelvis showed "Bilateral lower lobe atelectasis versus infiltrates. Mild acute pancreatitis. 2.1 cm indeterminate low-attenuation lesion at the pancreatic neck has decreased in size from 2.8 cm previously. Differential diagnosis includes small pseudocyst, peripancreatic lymphadenopathy, or pancreatic neoplasm. Consider continued followup by CT versus abdomen MRI without and with contrast if patient can cooperate with breath holding". I spoke with [name] regarding EUS results-benign. Patient will need short-term rehabilitation placement. He should be on dysphagia diet per speech therapy recommendations. He feels better today. He responds to questions appropriately but remains lethargic. His white count has improved to 13.1. Infectious disease making recommendations about his antibiotics   Plan Sepsis/Aspiration pneumonia?/Atelectasis/UTI (outpatient culture showed Citrobacter)?/Leukocytosis improving  Culture no growth so far, completed 7 days  of ertapenem, due to elevated CA-19-9 will repeat imaging of the abdomen. Repeat CT abdomen to evaluate for  pancreatic mass/abscess, check a mildly the lipase, cannot get an MRI because he has a stimulator back  Abdomen:could be the source of his fever per infectious disease Discussed with infectious disease GI consultation, Dr. Oletta Lamas to see the patient today   Aspiration pneumonia, hopefully has resolved by now  Continue  Bronchodilators/oxygen supplementation as needed/incentive spirometry  Continue dysphagia 3 diet with nectar thick liquids  Repeat chest x-ray 2/17 obtained-stable by basilar changes, no new focal abnormality  Blood culture, urine culture remain negative since admission     Acute encephalopathy/Weakness/Dementia without behavioral disturbance/Parkinsonism  Continue Sinemet, Cymbalta, Lyrica, rivastigmine  Physical therapy recommends SNF, anticipate discharge Monday  Reviewed speech therapy recommendations-dysphagia 3 diet with nectar thick liquids   Mild acute pancreatitis/pancreatic lesion  No acute changes   Essential hypertension  Norvasc discontinued per family request, blood pressure stable  Continue prn  hydralazine  Code Status: Full Family Communication: Updated patient's wife at bedside. Disposition Plan: SNF. Likely Monday   Consultants:  ID  Procedures:    Antibiotics:  Invanz 08/17/14>  HPI/Subjective: 101 this morning, complaining of abdominal pain and distention  Objective: Filed Vitals:   08/24/14 1205  BP: 141/54  Pulse: 72  Temp: 99 F (37.2 C)  Resp: 17    Intake/Output Summary (Last 24 hours) at 08/24/14 1219 Last data filed at 08/24/14 0640  Gross per 24 hour  Intake    720 ml  Output    600 ml  Net    120 ml   Filed Weights   08/21/14 2120 08/22/14 2031 08/23/14 2014  Weight: 71.3 kg (157 lb 3 oz) 81.9 kg (180 lb 8.9 oz) 81 kg (178 lb 9.2 oz)    Exam:  General: Awake and answering questions Skin: No rash. IV site normal Lungs: Clear. He has a loose cough Cor: Regular S1 and S2 with no  murmur Abdomen: Soft and  diffusely tender. No diarrhea reported by nurse  Data Reviewed: Basic Metabolic Panel:  Recent Labs Lab 08/18/14 0719 08/19/14 0432 08/22/14 1745  NA 138 139 139  K 3.9 3.7 3.6  CL 108 104 100  CO2 23 23 30   GLUCOSE 137* 140* 149*  BUN 20 19 22   CREATININE 0.99 0.92 1.03  CALCIUM 8.1* 8.3* 8.3*   Liver Function Tests:  Recent Labs Lab 08/18/14 0719 08/22/14 1745  AST 25 29  ALT 6 11  ALKPHOS 132* 138*  BILITOT 0.9 0.8  PROT 6.1 6.0  ALBUMIN 2.6* 2.4*    Recent Labs Lab 08/17/14 1630  LIPASE 85*   No results for input(s): AMMONIA in the last 168 hours. CBC:  Recent Labs Lab 08/18/14 0719 08/19/14 0432 08/22/14 1745  WBC 15.4* 12.7* 13.1*  NEUTROABS 14.0*  --   --   HGB 12.4* 12.0* 12.1*  HCT 37.7* 36.3* 36.5*  MCV 94.0 95.3 93.6  PLT 132* 140* 259   Cardiac Enzymes: No results for input(s): CKTOTAL, CKMB, CKMBINDEX, TROPONINI in the last 168 hours. BNP (last 3 results) No results for input(s): BNP in the last 8760 hours.  ProBNP (last 3 results) No results for input(s): PROBNP in the last 8760 hours.  CBG: No results for input(s): GLUCAP in the last 168 hours.  Recent Results (from the past 240 hour(s))  Culture, blood (routine x 2)     Status: None   Collection Time: 08/15/14  7:40 PM  Result Value Ref Range Status   Specimen Description BLOOD ARM RIGHT  Final   Special Requests BOTTLES DRAWN AEROBIC AND ANAEROBIC 5CC  Final   Culture   Final    NO GROWTH 5 DAYS Performed at [facility]    Report Status 08/22/2014 FINAL  Final  Culture, blood (routine x 2)     Status: None   Collection Time: 08/15/14  7:45 PM  Result Value Ref Range Status   Specimen Description BLOOD HAND RIGHT  Final   Special Requests BOTTLES DRAWN AEROBIC ONLY 3CC  Final   Culture   Final    NO GROWTH 5 DAYS Performed at [facility]    Report Status 08/22/2014 FINAL  Final  Urine culture     Status: None   Collection  Time: 08/15/14  8:45 PM  Result Value Ref Range Status   Specimen Description URINE, CATHETERIZED  Final   Special Requests NONE  Final   Colony Count NO GROWTH Performed at [facility]   Final   Culture NO GROWTH Performed at [facility]   Final   Report Status 08/17/2014 FINAL  Final  Culture, blood (routine x 2)     Status: None (Preliminary result)   Collection Time: 08/20/14  9:02 AM  Result Value Ref Range Status   Specimen Description BLOOD RIGHT HAND  Final   Special Requests BOTTLES DRAWN AEROBIC AND ANAEROBIC 4CC  Final   Culture   Final           BLOOD CULTURE RECEIVED NO GROWTH TO DATE CULTURE WILL BE HELD FOR 5 DAYS BEFORE ISSUING A FINAL NEGATIVE REPORT Performed at [facility]    Report Status PENDING  Incomplete  Culture, blood (routine x 2)     Status: None (Preliminary result)   Collection Time: 08/20/14  9:07 AM  Result Value Ref Range Status   Specimen Description BLOOD LEFT ANTECUBITAL  Final   Special Requests BOTTLES DRAWN AEROBIC AND ANAEROBIC 4CC  Final   Culture   Final           BLOOD CULTURE RECEIVED NO GROWTH TO DATE CULTURE WILL BE HELD FOR 5 DAYS BEFORE ISSUING A FINAL NEGATIVE REPORT Performed at [facility]    Report Status PENDING  Incomplete     Studies: No results found.  Scheduled Meds: . antiseptic oral rinse  7 mL Mouth Rinse BID  . beta carotene w/minerals  2 tablet Oral Daily  . carbidopa-levodopa  1 tablet Oral QHS  . carbidopa-levodopa  3 tablet Oral TID  . cholecalciferol  2,000 Units Oral Daily  . DULoxetine  60 mg Oral BID  . enoxaparin (LOVENOX) injection  40 mg Subcutaneous Q24H  . multivitamin with minerals  1 tablet Oral Daily  . pregabalin  150 mg Oral BID  . Rivastigmine  13.3 mg Transdermal Daily  . zinc sulfate  220 mg Oral q1800   Continuous Infusions:    Time spent: 25 minutes    [facility]  [facility] Pager [phone]. If 7PM-7AM, please contact  night-coverage at [email or web address], password [facility] 08/24/2014, 12:19 PM  LOS: 9 days

## 2014-08-24 NOTE — Progress Notes (Signed)
INFECTIOUS DISEASE PROGRESS NOTE  ID: Paul Callahan is a 81 y.o. male with  Principal Problem:   Sepsis Active Problems:   Parkinsonism   Dementia without behavioral disturbance   UTI (lower urinary tract infection)   Weakness   Leukocytosis   Acute encephalopathy   Blood poisoning   Enterobacter sepsis   Aspiration pneumonia   Atelectasis   Pancreatic lesion   Pancreatitis, acute   Dementia with behavioral disturbance   Dysphagia   Failure to thrive in adult   Essential hypertension   Pyrexia  Subjective: 81 yo M with hx of pancreatic mass, recent UTI as outpt, adm on 2-12 with fever, abd pain, and lethargy. He was treated with anbx for 2 weeks (6 days of ertapenem), which ended 2-19. He had mild decrease in temps (< 100.9 since 2-17), until this AM when he had temp 101.3.  He c/o abd pain, distension. Had normal BM this AM.   Abtx:  Anti-infectives    Start     Dose/Rate Route Frequency Ordered Stop   08/23/14 1400  ertapenem (INVANZ) 1 g in sodium chloride 0.9 % 50 mL IVPB     1 g 100 mL/hr over 30 Minutes Intravenous  Once 08/23/14 1219 08/23/14 1317   08/17/14 1400  ertapenem [facility]) injection 1 g  Status:  Discontinued     1 g Intramuscular Every 24 hours 08/17/14 1309 08/17/14 1352   08/17/14 1400  ertapenem (INVANZ) 1 g in sodium chloride 0.9 % 50 mL IVPB  Status:  Discontinued     1 g 100 mL/hr over 30 Minutes Intravenous Every 24 hours 08/17/14 1353 08/23/14 1219   08/16/14 1000  ceFEPIme (MAXIPIME) 2 g in dextrose 5 % 50 mL IVPB  Status:  Discontinued     2 g 100 mL/hr over 30 Minutes Intravenous Every 12 hours 08/16/14 0013 08/16/14 0841   08/16/14 1000  piperacillin-tazobactam (ZOSYN) IVPB 3.375 g  Status:  Discontinued     3.375 g 12.5 mL/hr over 240 Minutes Intravenous Every 8 hours 08/16/14 0852 08/16/14 1922   08/16/14 1000  metroNIDAZOLE (FLAGYL) IVPB 500 mg  Status:  Discontinued     500 mg 100 mL/hr over 60 Minutes Intravenous Every 8 hours  08/16/14 0923 08/16/14 1922   08/16/14 0915  tobramycin (NEBCIN) 510 mg in dextrose 5 % 100 mL IVPB  Status:  Discontinued     7 mg/kg  73 kg (Adjusted) 112.8 mL/hr over 60 Minutes Intravenous  Once 08/16/14 0914 08/16/14 0920   08/15/14 2215  ceFEPIme (MAXIPIME) 2 g in dextrose 5 % 50 mL IVPB     2 g 100 mL/hr over 30 Minutes Intravenous  Once 08/15/14 2210 08/15/14 2254      Medications:  Scheduled: . antiseptic oral rinse  7 mL Mouth Rinse BID  . beta carotene w/minerals  2 tablet Oral Daily  . carbidopa-levodopa  1 tablet Oral QHS  . carbidopa-levodopa  3 tablet Oral TID  . cholecalciferol  2,000 Units Oral Daily  . DULoxetine  60 mg Oral BID  . enoxaparin (LOVENOX) injection  40 mg Subcutaneous Q24H  . multivitamin with minerals  1 tablet Oral Daily  . pregabalin  150 mg Oral BID  . Rivastigmine  13.3 mg Transdermal Daily  . zinc sulfate  220 mg Oral q1800    Objective: Vital signs in last 24 hours: Temp:  [98 F (36.7 C)-101.1 F (38.4 C)] 98 F (36.7 C) (02/21 0941) Pulse Rate:  [76-95]  95 (02/21 0941) Resp:  [16-17] 17 (02/21 0941) BP: (127-140)/(60-84) 136/75 mmHg (02/21 0941) SpO2:  [94 %-99 %] 95 % (02/21 0941) Weight:  [81 kg (178 lb 9.2 oz)] 81 kg (178 lb 9.2 oz) (02/20 2014)   General appearance: answers question.s fatigued. per family this is worse.  Resp: diminished breath sounds anterior - bilateral Cardio: regular rate and rhythm GI: normal findings: soft and abnormal findings:  distended and minimal tenderness  No palpable DVT UE or LE  Lab Results  Recent Labs  08/22/14 1745  WBC 13.1*  HGB 12.1*  HCT 36.5*  NA 139  K 3.6  CL 100  CO2 30  BUN 22  CREATININE 1.03   Liver Panel  Recent Labs  08/22/14 1745  PROT 6.0  ALBUMIN 2.4*  AST 29  ALT 11  ALKPHOS 138*  BILITOT 0.8   Sedimentation Rate No results for input(s): ESRSEDRATE in the last 72 hours. C-Reactive Protein No results for input(s): CRP in the last 72  hours.  Microbiology: Recent Results (from the past 240 hour(s))  Culture, blood (routine x 2)     Status: None   Collection Time: 08/15/14  7:40 PM  Result Value Ref Range Status   Specimen Description BLOOD ARM RIGHT  Final   Special Requests BOTTLES DRAWN AEROBIC AND ANAEROBIC 5CC  Final   Culture   Final    NO GROWTH 5 DAYS Performed at [facility]    Report Status 08/22/2014 FINAL  Final  Culture, blood (routine x 2)     Status: None   Collection Time: 08/15/14  7:45 PM  Result Value Ref Range Status   Specimen Description BLOOD HAND RIGHT  Final   Special Requests BOTTLES DRAWN AEROBIC ONLY 3CC  Final   Culture   Final    NO GROWTH 5 DAYS Performed at [facility]    Report Status 08/22/2014 FINAL  Final  Urine culture     Status: None   Collection Time: 08/15/14  8:45 PM  Result Value Ref Range Status   Specimen Description URINE, CATHETERIZED  Final   Special Requests NONE  Final   Colony Count NO GROWTH Performed at [facility]   Final   Culture NO GROWTH Performed at [facility]   Final   Report Status 08/17/2014 FINAL  Final  Culture, blood (routine x 2)     Status: None (Preliminary result)   Collection Time: 08/20/14  9:02 AM  Result Value Ref Range Status   Specimen Description BLOOD RIGHT HAND  Final   Special Requests BOTTLES DRAWN AEROBIC AND ANAEROBIC 4CC  Final   Culture   Final           BLOOD CULTURE RECEIVED NO GROWTH TO DATE CULTURE WILL BE HELD FOR 5 DAYS BEFORE ISSUING A FINAL NEGATIVE REPORT Performed at [facility]    Report Status PENDING  Incomplete  Culture, blood (routine x 2)     Status: None (Preliminary result)   Collection Time: 08/20/14  9:07 AM  Result Value Ref Range Status   Specimen Description BLOOD LEFT ANTECUBITAL  Final   Special Requests BOTTLES DRAWN AEROBIC AND ANAEROBIC 4CC  Final   Culture   Final           BLOOD CULTURE RECEIVED NO GROWTH TO DATE CULTURE WILL BE HELD FOR 5  DAYS BEFORE ISSUING A FINAL NEGATIVE REPORT Performed at [facility]    Report Status PENDING  Incomplete    Studies/Results: No results found.   Assessment/Plan: Fever CA 19-9 98 Pancreatitis, Pancreatic Mass (2.8 cm 05-2014), Pancreatic Cyst (Bx - at [facility]?)  CT- 08-16-14 Diffuse pancreatic swelling is seen with mild  peripancreatic inflammatory changes, consistent with acute  pancreatitis. Ill-defined low-attenuation lesion at the pancreatic  neck measures 1.9 x 2.1 cm on image 59/series 2, which is decreased  in size from 2.4 x 2.8 cm previously. This could represent a  pancreatic pseudocyst, although pancreatic mass or peripancreatic  lymphadenopathy cannot be excluded. Pneumonia, basilar, aspiration UTI (outpt Cx citrobacter) Parkinson's  Total days of antibiotics: off 24h BCx 2-17 pending  Check amylase, lipase Ask Dr Watt Climes to see him Obtain records of previous bx Consider repeat CT or MRI of abd.  Hold anbx for now  Comment- would suggest his abd as the source of his fever. Suspect he has malignancy but his decreasing tumor markers (and previous negative bx) argue against this.  I spoke with pt and family at length regarding this.          Bobby Rumpf Infectious Diseases (pager) [phone] [email or web address] 08/24/2014, 11:04 AM  LOS: 9 days

## 2014-08-24 NOTE — Consult Note (Signed)
[facility] CONSULT Reason for consult: Unexplained Fever Referring Physician: [facility] hospitalist. PCP: Dr. Noah Delaine. Primary G.I.: Dr. Sherrian Divers is an 81 y.o. male.  HPI: he has a very complicated history and multiple medical problems. He has severe Parkinson's disease and dementia. He was hospitalized 11/15 and apparently had bacteremia at that time the CT scan showing a questionable cystic mass and the head of the pancreas. He sees [name] and had had previous colonoscopies last 2010. There was concern that this could be a pancreatic cancer and he underwent EUS 12/15 by [name]. 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 This was aspirated for cytology and for CEA. These results are not available in epic but were reported to be negative for cancer. I called the lab and apparently these type of aspirations that are sent out for cytology are not available on epic and are only available from the pathology department and are not available on weekends. We called the pathology department there is no one there to answer the phone to obtain this information at the current time. The records indicate as some people were told that this was negative.   the patient was readmitted to/13 with the WBC of 19,000 despite being on antibiotics for a week as an outpatient. CT of the chest and abdomen showed bilateral atelectasis versus pneumonia. He also had mild acute pancreatitis with the cystic lesion somewhat smaller than previously. The question of pancreatic pseudocyst versus cystic cancer was raised. He has continued to have some mild discomfort and there was some inflammation of the pancreas on CT. He has been treated with antibiotics despite the fact that is cultures of had no growth. In spite of this he has continued despite fevers and his wife is concerned that he has an infection of his pancreas. He has been followed by ID.  They feel that the abdomen may be the source of fever since it does not appear that this is coming from his lungs. His CA 19 9 was elevated but slowly come down. The patient's wife notes that he is not really been eating and is not had a lot of abdominal pain but has complained of some mild abdominal pain.   Past Medical History  Diagnosis Date  . Parkinson disease     'tremors are worsened at this time than before"  . Dementia   . Depression   . Bowel obstruction 12/15/2011  . Neuromuscular disorder     hx of parkinsons  . Arthritis   . Sepsis 05/20/2014  . Cancer     hx of colon cancer(surgery only)  . Impaired mobility     since hospital visit 11'15 overall weakness of legs -ambulates with walker" with assist    Past Surgical History  Procedure Laterality Date  . Cataracts Bilateral     '94,'95  . Hernia repair    . Back surgery      x 5  . Frozen shoulder    . Vasectomy    . Total shoulder arthroplasty Left   . Lumbar fusion    . Spinal cord stimulator implant    . Colon surgery      '88 colon resection(cancer)  . Eye surgery      strabismus  repair '58, laser surgery also after cataract surgery  . Eus N/A 06/18/2014    Procedure: ESOPHAGEAL ENDOSCOPIC ULTRASOUND (EUS) RADIAL;  Surgeon: Gwyndolyn Saxon  Paulita Fujita, MD;  Location: [facility];  Service: Endoscopy;  Laterality: N/A;    History reviewed. No pertinent family history.  Social History:  reports that he has quit smoking. He has never used smokeless tobacco. He reports that he does not drink alcohol or use illicit drugs.  Allergies:  Allergies  Allergen Reactions  . Amantadines Other (See Comments)    Weakness in legs and hallucinations  . Fentanyl Other (See Comments)    Confusion and disorientation   . Gabapentin Other (See Comments)    Weakness in legs and hallucinations  . Oxycodone Other (See Comments)    Confusion and disorientation   . Phenergan [Promethazine Hcl] Other (See Comments)    Confusion and  disorientation   . Vancomycin Itching, Rash and Other (See Comments)    Red man's syndrome   . Ciprofloxacin Diarrhea and Rash    Medications; Prior to Admission medications   Medication Sig Start Date End Date Taking? Authorizing Provider  acetaminophen (TYLENOL) 500 MG tablet Take 1,000 mg by mouth every morning.    Yes Historical Provider, MD  acetaminophen (TYLENOL) 650 MG CR tablet Take 650 mg by mouth every evening.   Yes Historical Provider, MD  amoxicillin-clavulanate (AUGMENTIN) 875-125 MG per tablet Take 1 tablet by mouth 2 (two) times daily. Patient taking differently: Take 1 tablet by mouth 2 (two) times daily. Started 08/14/14, for 7 days ending 08/20/14 06/18/14  Yes Arta Silence, MD  Biotin 3 MG TABS Take 6 mg by mouth every evening.   Yes Historical Provider, MD  Calcium Carb-Cholecalciferol (CALCIUM 600 + D PO) Take 1 tablet by mouth 2 (two) times daily.   Yes Historical Provider, MD  carbidopa-levodopa (SINEMET CR) 25-100 MG per tablet Take 1 tablet by mouth at bedtime.   Yes Historical Provider, MD  carbidopa-levodopa (SINEMET IR) 25-100 MG per tablet Take 3 tablets by mouth 3 (three) times daily.   Yes Historical Provider, MD  celecoxib (CELEBREX) 200 MG capsule Take 200 mg by mouth daily with supper.   Yes Historical Provider, MD  cholecalciferol (VITAMIN D) 1000 UNITS tablet Take 2,000 Units by mouth daily.    Yes Historical Provider, MD  DULoxetine (CYMBALTA) 60 MG capsule Take 60 mg by mouth 2 (two) times daily.   Yes Historical Provider, MD  EXELON 13.3 MG/24HR PT24 Place 13.3 mg onto the skin daily.  05/27/14  Yes Historical Provider, MD  Multiple Vitamin (MULTIVITAMIN WITH MINERALS) TABS Take 1 tablet by mouth daily.   Yes Historical Provider, MD  Multiple Vitamins-Minerals (PRESERVISION AREDS 2) CAPS Take 2 capsules by mouth every morning.   Yes Historical Provider, MD  pregabalin (LYRICA) 150 MG capsule Take 150 mg by mouth 2 (two) times daily.   Yes Historical  Provider, MD  zinc gluconate 50 MG tablet Take 100 mg by mouth every evening.    Yes Historical Provider, MD  carvedilol (COREG) 3.125 MG tablet Take 1 tablet (3.125 mg total) by mouth 2 (two) times daily with a meal. Patient taking differently: Take 3.125 mg by mouth 2 (two) times daily with a meal. 06-21-14 Pt spouse states he is not taking this med. 05/24/14   Thurnell Lose, MD  Probiotic Product (RISAQUAD-2) CAPS 1 PO twice a day Patient taking differently: Take 1 capsule by mouth daily.  05/24/14   Thurnell Lose, MD   . antiseptic oral rinse  7 mL Mouth Rinse BID  . beta carotene w/minerals  2 tablet Oral Daily  . carbidopa-levodopa  1 tablet Oral QHS  . carbidopa-levodopa  3 tablet Oral TID  . cholecalciferol  2,000 Units Oral Daily  . DULoxetine  60 mg Oral BID  . enoxaparin (LOVENOX) injection  40 mg Subcutaneous Q24H  . iohexol  25 mL Oral Q1 Hr x 2  . multivitamin with minerals  1 tablet Oral Daily  . pregabalin  150 mg Oral BID  . Rivastigmine  13.3 mg Transdermal Daily  . zinc sulfate  220 mg Oral q1800   PRN Meds acetaminophen **OR** acetaminophen, albuterol, alum & mag hydroxide-simeth, food thickener, hydrALAZINE, HYDROmorphone (DILAUDID) injection, ondansetron **OR** ondansetron (ZOFRAN) IV Results for orders placed or performed during the hospital encounter of 08/15/14 (from the past 48 hour(s))  Comprehensive metabolic panel     Status: Abnormal   Collection Time: 08/22/14  5:45 PM  Result Value Ref Range   Sodium 139 135 - 145 mmol/L   Potassium 3.6 3.5 - 5.1 mmol/L   Chloride 100 96 - 112 mmol/L   CO2 30 19 - 32 mmol/L   Glucose, Bld 149 (H) 70 - 99 mg/dL   BUN 22 6 - 23 mg/dL   Creatinine, Ser 1.03 0.50 - 1.35 mg/dL   Calcium 8.3 (L) 8.4 - 10.5 mg/dL   Total Protein 6.0 6.0 - 8.3 g/dL   Albumin 2.4 (L) 3.5 - 5.2 g/dL   AST 29 0 - 37 U/L   ALT 11 0 - 53 U/L   Alkaline Phosphatase 138 (H) 39 - 117 U/L   Total Bilirubin 0.8 0.3 - 1.2 mg/dL   GFR calc  non Af Amer 63 (L) >90 mL/min   GFR calc Af Amer 73 (L) >90 mL/min    Comment: (NOTE) The eGFR has been calculated using the CKD EPI equation. This calculation has not been validated in all clinical situations. eGFR's persistently <90 mL/min signify possible Chronic Kidney Disease.    Anion gap 9 5 - 15  CBC     Status: Abnormal   Collection Time: 08/22/14  5:45 PM  Result Value Ref Range   WBC 13.1 (H) 4.0 - 10.5 K/uL   RBC 3.90 (L) 4.22 - 5.81 MIL/uL   Hemoglobin 12.1 (L) 13.0 - 17.0 g/dL   HCT 36.5 (L) 39.0 - 52.0 %   MCV 93.6 78.0 - 100.0 fL   MCH 31.0 26.0 - 34.0 pg   MCHC 33.2 30.0 - 36.0 g/dL   RDW 14.7 11.5 - 15.5 %   Platelets 259 150 - 400 K/uL    No results found.             Blood pressure 141/54, pulse 72, temperature 99 F (37.2 C), temperature source Oral, resp. rate 17, height 5' 7"  (1.702 m), weight 81 kg (178 lb 9.2 oz), SpO2 95 %.  Physical exam:   General-- pleasantly confused white male obvious Parkinson's features ENT--nonicteric  Neck-- no lymphadenopathy Heart-- regular rate and rhythm without murmurs are gallops Lungs--clear  Abdomen-- nontender, none distended with good bowel sounds.     Assessment: 1. Fever. Not clear what the source of this is. He has been treated with antibiotics without any real improvement and cultures are negative. The issue has been raised if this is all coming from a pancreatic tumor. 2. Cystic lesion in the pancreas. There is also diffuse swelling of the pancreas and some peripancreatic edema. Lipase slightly elevated on admission. Patient has no gallstones with recent ultrasound but hepatobiliary scan with ejection fraction recently was normal. The cystic  lesion was aspirated by [name] and the results are reported to be negative but currently are not available in epic and only available from the pathology office according to the information I received from the laboratory.  Plan: agree with repeating CT scan  with attention to the pancreas. Would follow amylase and lipase. Unfortunately MRCP is not an option to the metal stimulator. May need to have the EUS repeated.    Knight Oelkers JR,Meric Joye L 08/24/2014, 2:11 PM

## 2014-08-25 DIAGNOSIS — R630 Anorexia: Secondary | ICD-10-CM

## 2014-08-25 DIAGNOSIS — M79605 Pain in left leg: Secondary | ICD-10-CM

## 2014-08-25 DIAGNOSIS — Z515 Encounter for palliative care: Secondary | ICD-10-CM

## 2014-08-25 DIAGNOSIS — M79604 Pain in right leg: Secondary | ICD-10-CM

## 2014-08-25 LAB — COMPREHENSIVE METABOLIC PANEL
ALT: 5 U/L (ref 0–53)
ANION GAP: 5 (ref 5–15)
AST: 36 U/L (ref 0–37)
Albumin: 2.3 g/dL — ABNORMAL LOW (ref 3.5–5.2)
Alkaline Phosphatase: 139 U/L — ABNORMAL HIGH (ref 39–117)
BUN: 15 mg/dL (ref 6–23)
CALCIUM: 8.1 mg/dL — AB (ref 8.4–10.5)
CO2: 28 mmol/L (ref 19–32)
CREATININE: 0.82 mg/dL (ref 0.50–1.35)
Chloride: 101 mmol/L (ref 96–112)
GFR, EST AFRICAN AMERICAN: 89 mL/min — AB (ref >90)
GFR, EST NON AFRICAN AMERICAN: 77 mL/min — AB (ref >90)
Glucose, Bld: 173 mg/dL — ABNORMAL HIGH (ref 70–99)
Potassium: 3.9 mmol/L (ref 3.5–5.1)
SODIUM: 134 mmol/L — AB (ref 135–145)
Total Bilirubin: 0.7 mg/dL (ref 0.3–1.2)
Total Protein: 5.9 g/dL — ABNORMAL LOW (ref 6.0–8.3)

## 2014-08-25 LAB — CBC
HCT: 35.3 % — ABNORMAL LOW (ref 39.0–52.0)
Hemoglobin: 11.7 g/dL — ABNORMAL LOW (ref 13.0–17.0)
MCH: 30.4 pg (ref 26.0–34.0)
MCHC: 33.1 g/dL (ref 30.0–36.0)
MCV: 91.7 fL (ref 78.0–100.0)
PLATELETS: 305 10*3/uL (ref 150–400)
RBC: 3.85 MIL/uL — ABNORMAL LOW (ref 4.22–5.81)
RDW: 14.5 % (ref 11.5–15.5)
WBC: 12.4 10*3/uL — AB (ref 4.0–10.5)

## 2014-08-25 LAB — LIPASE, BLOOD: Lipase: 41 U/L (ref 11–59)

## 2014-08-25 MED ORDER — ACETAMINOPHEN 325 MG PO TABS: 650.0000 mg | ORAL_TABLET | Freq: Four times a day (QID) | ORAL | Status: AC | PRN

## 2014-08-25 MED ORDER — STARCH (THICKENING) PO POWD: 1.0000 | ORAL | Status: DC | PRN

## 2014-08-25 MED ORDER — LORAZEPAM 2 MG/ML PO CONC: 1.0000 mg | Freq: Three times a day (TID) | ORAL | Status: DC | PRN

## 2014-08-25 MED ORDER — MORPHINE SULFATE (CONCENTRATE) 10 MG /0.5 ML PO SOLN: 5.0000 mg | ORAL | Status: DC | PRN

## 2014-08-25 NOTE — Clinical Social Work Note (Signed)
CSW received consult today regarding patient and residential hospice placement and was informed that family requesting [employer]. Paul Callahan, CSW with [facility] has talked with family today and patient will discharge Tuesday to residential hospice versus SNF depending on his medical status.  Jaiyah Beining Givens, MSW, LCSW Licensed Clinical Social Worker [place] [phone]

## 2014-08-25 NOTE — Progress Notes (Signed)
Subjective: Per wife:  Patient not eating much, but not in apparent pain.  Objective: Vital signs in last 24 hours: Temp:  [97.6 F (36.4 C)-99.5 F (37.5 C)] 97.6 F (36.4 C) (02/22 0554) Pulse Rate:  [72-81] 79 (02/22 0554) Resp:  [15-17] 15 (02/22 0554) BP: (137-177)/(54-75) 137/59 mmHg (02/22 0554) SpO2:  [95 %-98 %] 95 % (02/22 0554) Weight:  [81.3 kg (179 lb 3.7 oz)] 81.3 kg (179 lb 3.7 oz) (02/21 2114) Weight change: 0.3 kg (10.6 oz) Last BM Date: 08/17/14  PE: GEN:  Somnolent, not communicative ABD:  Slight abdominal distention, non-tender  Lab Results: CBC    Component Value Date/Time   WBC 12.4* 08/25/2014 0617   RBC 3.85* 08/25/2014 0617   HGB 11.7* 08/25/2014 0617   HCT 35.3* 08/25/2014 0617   PLT 305 08/25/2014 0617   MCV 91.7 08/25/2014 0617   MCH 30.4 08/25/2014 0617   MCHC 33.1 08/25/2014 0617   RDW 14.5 08/25/2014 0617   LYMPHSABS 0.6* 08/18/2014 0719   MONOABS 0.8 08/18/2014 0719   EOSABS 0.0 08/18/2014 0719   BASOSABS 0.0 08/18/2014 0719   CMP     Component Value Date/Time   NA 134* 08/25/2014 0617   K 3.9 08/25/2014 0617   CL 101 08/25/2014 0617   CO2 28 08/25/2014 0617   GLUCOSE 173* 08/25/2014 0617   BUN 15 08/25/2014 0617   CREATININE 0.82 08/25/2014 0617   CALCIUM 8.1* 08/25/2014 0617   PROT 5.9* 08/25/2014 0617   ALBUMIN 2.3* 08/25/2014 0617   AST 36 08/25/2014 0617   ALT 5 08/25/2014 0617   ALKPHOS 139* 08/25/2014 0617   BILITOT 0.7 08/25/2014 0617   GFRNONAA 77* 08/25/2014 0617   GFRAA 89* 08/25/2014 0617   CMP     Component Value Date/Time   NA 134* 08/25/2014 0617   K 3.9 08/25/2014 0617   CL 101 08/25/2014 0617   CO2 28 08/25/2014 0617   GLUCOSE 173* 08/25/2014 0617   BUN 15 08/25/2014 0617   CREATININE 0.82 08/25/2014 0617   CALCIUM 8.1* 08/25/2014 0617   PROT 5.9* 08/25/2014 0617   ALBUMIN 2.3* 08/25/2014 0617   AST 36 08/25/2014 0617   ALT 5 08/25/2014 0617   ALKPHOS 139* 08/25/2014 0617   BILITOT 0.7  08/25/2014 0617   GFRNONAA 77* 08/25/2014 0617   GFRAA 89* 08/25/2014 0617   Studies/Results: Ct Abdomen Pelvis W Wo Contrast  08/24/2014   CLINICAL DATA:  Generalized abdominal pain. Previous CT demonstrated bilateral lower lobe atelectasis versus infiltrates. Mild acute pancreatitis. Indeterminate lesion in the pancreatic neck. Followup CT is obtained.  EXAM: CT ABDOMEN AND PELVIS WITHOUT AND WITH CONTRAST  TECHNIQUE: Multidetector CT imaging of the abdomen and pelvis was performed following the standard protocol before and following the bolus administration of intravenous contrast.  CONTRAST:  128mL OMNIPAQUE IOHEXOL 300 MG/ML  SOLN  COMPARISON:  08/16/2014  FINDINGS: Small bilateral pleural effusions with bilateral basilar atelectasis or consolidation, increasing since the prior study.  Residual contrast material in the stomach and bowel as well as surgical hardware in the spine creates streak artifact, limiting evaluation. Examination is also technically limited due to motion artifact. Noncontrast imaging demonstrates no significant pancreatic calcifications. Vascular calcifications in the aorta and branch vessels.  Diffuse infiltration and edema in around the pancreas. The is is consistent with progression of changes of acute pancreatitis since previous study. Parenchymal enhancement pattern is somewhat heterogeneous. Small focal areas of necrosis are not excluded. No pancreatic ductal dilatation. No developing  pseudocysts, walled-off necrosis, or peripancreatic fluid collections. The low-attenuation seen previously in the pancreatic neck is obscured by the diffuse pancreatic process. Small amount of free fluid and edema extending along the anterior perirenal fascia and along the pericolic gutters bilaterally.  Visualized liver, spleen, gallbladder, adrenal glands, kidneys, inferior vena cava, and retroperitoneal lymph nodes are unremarkable. Abdominal aorta is patent without evidence of aneurysm.  Abdominal aortic calcifications are present. Stomach, small bowel, and colon are not abnormally distended. No free air in the abdomen. Anterior abdominal wall defect containing fat herniation.  Pelvis: No free or loculated pelvic fluid collections. Prostate gland is enlarged with calcifications. Bladder wall is thickened and may suggest cystitis. Appendix is normal. No pelvic mass or lymphadenopathy. Postoperative changes with posterior fixation of the lumbar spine. Degenerative changes throughout the lumbar spine.  IMPRESSION: Progression of changes of acute pancreatitis with increasing pancreatic and peripancreatic edema. Can't exclude early focal areas of pancreatic necrosis. No developing pseudo cyst or walled-off necrosis. Increasing bilateral pleural effusions and basilar atelectasis/ consolidation.   Electronically Signed   By: Lucienne Capers M.D.   On: 08/24/2014 21:27   Assessment:  1.  Fevers. 2.  CT scan showing progression of pancreatitis with possible early necrosis. 3.  Overall, suspect patient's fevers and decline are likely in large part due to pancreatitis. 4.  Pancreatic cystic area, prior cyst aspiration not definitive for cancer.  Unclear if there is underlying cancer at this point.  Plan:  1.  I had lengthy discussion with patient's wife.  I counseled her that we likely would never know for certain whether her husband has pancreatic cancer, unless we did invasive further testing, which both she and I don't think are in her husband's best interest. 2.  I also told wife that I think pancreatitis is at least in large part contributing to patient's fever and decline.  I told wife that we could consider NPO status, nasojejunal feeds as treatment for pancreatitis, but, again, both she and I don't think such invasive measures would be in concordance with patient's wishes. 3.  Overall, I expressed my skepticism that patient would ever improve to his pre-hospitalization functional level.   I have discussed the option of palliative care consultation as part of the management for her husband.  She will think this over. 4.  Prognosis overall guarded/poor. 5.  Will follow.   Landry Dyke 08/25/2014, 9:42 AM

## 2014-08-25 NOTE — Progress Notes (Signed)
Physical Therapy Treatment Patient Details Name: Paul Callahan MRN: [id number] DOB: [birth year] Today's Date: 08/25/2014    History of Present Illness Pt is an 81 y.o. male who was brought to the ED due to complaints of ABD pain since last night along with weakness and increased somnolence and fevers today.He had been placed on Amoxicillin initially for a UTI, which was changed to Augmentin/Clavulinic Acid 500 mg BID 1 days ago for a UT when his culture results returned.He had been having Urinary Frequency and urgency for the past 2-3 weeks and was seen by his PCP and started on an antibiotic Rx.    PT Comments    Focus of session was transition back to bed from recliner chair. Pt required max assist for anterior lean in the chair and repositioning to get lift pad under patient. Once back in bed, mod assist for rolling activity, and +2 assist to scoot to [facility]. Pt overall very lethargic and fatigued. Family present and were educated on current mobility status, goals of PT, and frequency of visits. Will continue to follow.  Follow Up Recommendations  SNF;Supervision/Assistance - 24 hour     Equipment Recommendations  None recommended by PT    Recommendations for Other Services       Precautions / Restrictions Precautions Precautions: Fall Restrictions Weight Bearing Restrictions: No    Mobility  Bed Mobility Overal bed mobility: Needs Assistance Bed Mobility: Rolling Rolling: Max assist   Supine to sit: Max assist;+2 for physical assistance     General bed mobility comments: Pt required hand-over-hand assist to reach for bed rail to initiate rolling activity. Pt required physical assist and guidance from the hip and knee to complete full roll bilaterally.   Transfers Overall transfer level: Needs assistance Equipment used: 2 person hand held assist (To [place]) Transfers: Sit to/from Stand Sit to Stand: Total assist;+2 physical assistance;From elevated surface          General transfer comment: Maxi-move from chair to bed  Ambulation/Gait             General Gait Details: Unable at this time.    Stairs            Wheelchair Mobility    Modified Rankin (Stroke Patients Only)       Balance Overall balance assessment: Needs assistance Sitting-balance support: Feet supported;Bilateral upper extremity supported Sitting balance-Leahy Scale: Poor     Standing balance support: Bilateral upper extremity supported;During functional activity Standing balance-Leahy Scale: Zero                      Cognition Arousal/Alertness: Awake/alert Behavior During Therapy: Flat affect Overall Cognitive Status: History of cognitive impairments - at baseline                      Exercises      General Comments General comments (skin integrity, edema, etc.): Family education on current mobility status today vs. last visit, and goals of care      Pertinent Vitals/Pain Pain Assessment: Faces Faces Pain Scale: No hurt Pain Location: During transfers Pain Intervention(s): Limited activity within patient's tolerance;Monitored during session;Repositioned    Home Living                      Prior Function            PT Goals (current goals can now be found in the care plan section) Acute Rehab PT Goals Patient  Stated Goal: Pt was unable to state goals at this time.  PT Goal Formulation: With family Time For Goal Achievement: 09/01/14 Potential to Achieve Goals: Fair Progress towards PT goals: Not progressing toward goals - comment    Frequency  Min 2X/week    PT Plan Current plan remains appropriate    Co-evaluation             End of Session Equipment Utilized During Treatment: Other (comment);Oxygen (Maxi-Move) Activity Tolerance: Patient limited by lethargy;Patient limited by fatigue Patient left: in bed;with call bell/phone within reach;with nursing/sitter in room     Time: 1430-1442 PT Time  Calculation (min) (ACUTE ONLY): 12 min  Charges:  $Therapeutic Activity: 8-22 mins                    G Codes:      Rolinda Roan 09/10/14, 3:04 PM   Rolinda Roan, PT, DPT Acute Rehabilitation Services Pager: [phone]

## 2014-08-25 NOTE — Progress Notes (Signed)
Physical Therapy Treatment Patient Details Name: Paul Callahan MRN: [id number] DOB: [birth year] Today's Date: 08/25/2014    History of Present Illness Pt is an 81 y.o. male who was brought to the ED due to complaints of ABD pain since last night along with weakness and increased somnolence and fevers today.He had been placed on Amoxicillin initially for a UTI, which was changed to Augmentin/Clavulinic Acid 500 mg BID 1 days ago for a UT when his culture results returned.He had been having Urinary Frequency and urgency for the past 2-3 weeks and was seen by his PCP and started on an antibiotic Rx.    PT Comments    Pt required increased assistance this session for all mobility. In previous session he was able to take steps from bed to chair with +2 assist, however today was unable to achieve full standing. +2 total assist was required to get the patient sitting on hip supports of the [place]. A third person was required to assist pt into semi-standing from the [facility], to raise hip support flaps and return pt to sitting in the recliner chair. As pt continues to decline in function, feel that SNF is the most appropriate d/c disposition, and pt/family would benefit from a palliative care consult. Recommended to nursing staff that the [facility] lift to be utilized for back to bed. PT placed lift pad in room.   Follow Up Recommendations  SNF;Supervision/Assistance - 24 hour     Equipment Recommendations  None recommended by PT    Recommendations for Other Services       Precautions / Restrictions Precautions Precautions: Fall Restrictions Weight Bearing Restrictions: No    Mobility  Bed Mobility Overal bed mobility: Needs Assistance;+2 for physical assistance Bed Mobility: Supine to Sit     Supine to sit: Max assist;+2 for physical assistance     General bed mobility comments: Assist to elevate trunk to full sitting position and scoot out to EOB. Assist for sitting balance required  throughout EOB activity.   Transfers Overall transfer level: Needs assistance Equipment used: 2 person hand held assist (To [place]) Transfers: Sit to/from Stand Sit to Stand: Total assist;+2 physical assistance;From elevated surface         General transfer comment: Pt was not able to achieve full standing with +2 assist. Was able to stand enough from bed to get [place] flaps down for pt to sit on, however third person was required to achieve stand enough to raise flaps on [place] to sit pt in chair. Maxi-move will be required for back to bed with nursing staff.   Ambulation/Gait             General Gait Details: Unable at this time.    Stairs            Wheelchair Mobility    Modified Rankin (Stroke Patients Only)       Balance Overall balance assessment: Needs assistance Sitting-balance support: Feet supported;Bilateral upper extremity supported Sitting balance-Leahy Scale: Poor     Standing balance support: Bilateral upper extremity supported;During functional activity Standing balance-Leahy Scale: Zero                      Cognition Arousal/Alertness: Awake/alert Behavior During Therapy: Flat affect Overall Cognitive Status: History of cognitive impairments - at baseline                      Exercises      General Comments General comments (skin  integrity, edema, etc.): Wife appeared upset during session to see her husband struggle with transition from bed to chair.       Pertinent Vitals/Pain Pain Assessment: Faces Faces Pain Scale: Hurts even more Pain Location: During transfers Pain Intervention(s): Limited activity within patient's tolerance;Monitored during session;Repositioned    Home Living                      Prior Function            PT Goals (current goals can now be found in the care plan section) Acute Rehab PT Goals Patient Stated Goal: Pt was unable to state goals at this time.  PT Goal Formulation: With  family Time For Goal Achievement: 09/01/14 Potential to Achieve Goals: Fair Progress towards PT goals: Not progressing toward goals - comment    Frequency  Min 2X/week    PT Plan Current plan remains appropriate    Co-evaluation             End of Session Equipment Utilized During Treatment: Gait belt;Oxygen Activity Tolerance: Patient limited by fatigue (General weakness) Patient left: in chair;with call bell/phone within reach;with family/visitor present     Time: 8185-6314 PT Time Calculation (min) (ACUTE ONLY): 48 min  Charges:  $Therapeutic Activity: 38-52 mins                    G Codes:      Rolinda Roan 02-Sep-2014, 11:34 AM  Rolinda Roan, PT, DPT Acute Rehabilitation Services Pager: [phone]

## 2014-08-25 NOTE — Discharge Summary (Addendum)
Physician Discharge Summary  Paul Callahan MRN: [id number] DOB/AGE: 07-06-1923 81 y.o.  PCP: [name], MD   Admit date: 08/15/2014 Discharge date: 08/25/2014  Discharge Diagnoses:  Patient hospice eligible   Sepsis Active Problems:   Parkinsonism   Dementia without behavioral disturbance   UTI (lower urinary tract infection)   Weakness   Leukocytosis   Acute encephalopathy   Blood poisoning   Enterobacter sepsis   Aspiration pneumonia   Atelectasis   Pancreatic lesion   Pancreatitis, acute   Dementia with behavioral disturbance   Dysphagia   Failure to thrive in adult   Essential hypertension   Pyrexia  Follow-up recommendations Hospice to follow patient upon discharge Patient will discharge to [facility] No repeat hospitalizations, lab draws, invasive workup     Medication List    STOP taking these medications        amoxicillin-clavulanate 875-125 MG per tablet  Commonly known as:  AUGMENTIN     CALCIUM 600 + D PO     celecoxib 200 MG capsule  Commonly known as:  CELEBREX     multivitamin with minerals Tabs tablet     PRESERVISION AREDS 2 Caps     zinc gluconate 50 MG tablet      TAKE these medications        acetaminophen 325 MG tablet  Commonly known as:  TYLENOL  Take 2 tablets (650 mg total) by mouth every 6 (six) hours as needed for mild pain (or Fever >/= 101).     Biotin 3 MG Tabs  Take 6 mg by mouth every evening.     carbidopa-levodopa 25-100 MG per tablet  Commonly known as:  SINEMET IR  Take 3 tablets by mouth 3 (three) times daily.     carbidopa-levodopa 25-100 MG per tablet  Commonly known as:  SINEMET CR  Take 1 tablet by mouth at bedtime.     carvedilol 3.125 MG tablet  Commonly known as:  COREG  Take 1 tablet (3.125 mg total) by mouth 2 (two) times daily with a meal.     cholecalciferol 1000 UNITS tablet  Commonly known as:  VITAMIN D  Take 2,000 Units by mouth daily.     DULoxetine 60 MG capsule   Commonly known as:  CYMBALTA  Take 60 mg by mouth 2 (two) times daily.     EXELON 13.3 MG/24HR Pt24  Generic drug:  Rivastigmine  Place 13.3 mg onto the skin daily.     food thickener Powd  Commonly known as:  THICK IT  Take 1 Container by mouth as needed (For nectar thick liquids).     LORazepam 2 MG/ML concentrated solution  Commonly known as:  LORAZEPAM INTENSOL  Take 0.5 mLs (1 mg total) by mouth every 8 (eight) hours as needed for anxiety.     morphine CONCENTRATE 10 mg / 0.5 ml concentrated solution  Take 0.25 mLs (5 mg total) by mouth every 2 (two) hours as needed for severe pain.     pregabalin 150 MG capsule  Commonly known as:  LYRICA  Take 150 mg by mouth 2 (two) times daily.     RISAQUAD-2 Caps  1 PO twice a day        Discharge Condition: Disposition: 01-Home or Self Care   Consults: * Gastroenterology Infectious disease Palliative care    Significant Diagnostic Studies: Ct Abdomen Pelvis W Wo Contrast  08/24/2014   CLINICAL DATA:  Generalized abdominal pain. Previous CT demonstrated bilateral lower  lobe atelectasis versus infiltrates. Mild acute pancreatitis. Indeterminate lesion in the pancreatic neck. Followup CT is obtained.  EXAM: CT ABDOMEN AND PELVIS WITHOUT AND WITH CONTRAST  TECHNIQUE: Multidetector CT imaging of the abdomen and pelvis was performed following the standard protocol before and following the bolus administration of intravenous contrast.  CONTRAST:  170m OMNIPAQUE IOHEXOL 300 MG/ML  SOLN  COMPARISON:  08/16/2014  FINDINGS: Small bilateral pleural effusions with bilateral basilar atelectasis or consolidation, increasing since the prior study.  Residual contrast material in the stomach and bowel as well as surgical hardware in the spine creates streak artifact, limiting evaluation. Examination is also technically limited due to motion artifact. Noncontrast imaging demonstrates no significant pancreatic calcifications. Vascular  calcifications in the aorta and branch vessels.  Diffuse infiltration and edema in around the pancreas. The is is consistent with progression of changes of acute pancreatitis since previous study. Parenchymal enhancement pattern is somewhat heterogeneous. Small focal areas of necrosis are not excluded. No pancreatic ductal dilatation. No developing pseudocysts, walled-off necrosis, or peripancreatic fluid collections. The low-attenuation seen previously in the pancreatic neck is obscured by the diffuse pancreatic process. Small amount of free fluid and edema extending along the anterior perirenal fascia and along the pericolic gutters bilaterally.  Visualized liver, spleen, gallbladder, adrenal glands, kidneys, inferior vena cava, and retroperitoneal lymph nodes are unremarkable. Abdominal aorta is patent without evidence of aneurysm. Abdominal aortic calcifications are present. Stomach, small bowel, and colon are not abnormally distended. No free air in the abdomen. Anterior abdominal wall defect containing fat herniation.  Pelvis: No free or loculated pelvic fluid collections. Prostate gland is enlarged with calcifications. Bladder wall is thickened and may suggest cystitis. Appendix is normal. No pelvic mass or lymphadenopathy. Postoperative changes with posterior fixation of the lumbar spine. Degenerative changes throughout the lumbar spine.  IMPRESSION: Progression of changes of acute pancreatitis with increasing pancreatic and peripancreatic edema. Can't exclude early focal areas of pancreatic necrosis. No developing pseudo cyst or walled-off necrosis. Increasing bilateral pleural effusions and basilar atelectasis/ consolidation.   Electronically Signed   By: WLucienne CapersM.D.   On: 08/24/2014 21:27   Dg Chest 2 View  08/15/2014   CLINICAL DATA:  Dementia.  Urosepsis.  Weakness.  EXAM: CHEST  2 VIEW  COMPARISON:  05/22/14  FINDINGS: Low lung volumes are again noted. Both lungs remain clear. No evidence  of pleural effusion. Left shoulder prosthesis is again demonstrated.  IMPRESSION: Stable low lung volumes.  No acute findings.   Electronically Signed   By: JEarle GellM.D.   On: 08/15/2014 20:48   Ct Chest W Contrast  08/16/2014   CLINICAL DATA:  Sepsis.  Bowel obstruction.  Colon carcinoma.  EXAM: CT CHEST, ABDOMEN, AND PELVIS WITH CONTRAST  TECHNIQUE: Multidetector CT imaging of the chest, abdomen and pelvis was performed following the standard protocol during bolus administration of intravenous contrast.  CONTRAST:  1058mOMNIPAQUE IOHEXOL 300 MG/ML  SOLN  COMPARISON:  Abdomen pelvis CT only on 05/20/2014  FINDINGS: CT CHEST FINDINGS  Mediastinum/Lymph Nodes: No masses or pathologically enlarged lymph nodes identified.  Lungs/Pleura: Atelectasis or infiltrate is seen in the lower lobes bilaterally. No evidence of pleural effusion.  Musculoskeletal/Soft Tissues: No suspicious bone lesions or other significant chest wall abnormality.  CT ABDOMEN AND PELVIS FINDINGS  Hepatobiliary: No masses or other significant abnormality identified. Gallbladder is unremarkable.  Pancreas: Diffuse pancreatic swelling is seen with mild peripancreatic inflammatory changes, consistent with acute pancreatitis. Ill-defined low-attenuation lesion at the pancreatic  neck measures 1.9 x 2.1 cm on image 59/series 2, which is decreased in size from 2.4 x 2.8 cm previously. This could represent a pancreatic pseudocyst, although pancreatic mass or peripancreatic lymphadenopathy cannot be excluded.  Spleen:  Within normal limits in size and appearance.  Adrenal Glands:  No mass identified.  Kidneys/Urinary Tract: No masses identified. No evidence of hydronephrosis.  Stomach/Bowel/Peritoneum: No evidence of wall thickening, mass, or obstruction. Minimal amount of free fluid noted in left lower quadrant mesentery, without associated bowel wall thickening.  Vascular/Lymphatic: No pathologically enlarged lymph nodes identified. No other  significant abnormality identified.  Reproductive:  No mass or other significant abnormality identified.  Other:  None.  Musculoskeletal: No suspicious bone lesions identified. Previous lumbar spine fusion and severe right hip osteoarthritis again noted  IMPRESSION: Bilateral lower lobe atelectasis versus infiltrates.  Mild acute pancreatitis.  2.1 cm indeterminate low-attenuation lesion at the pancreatic neck has decreased in size from 2.8 cm previously. Differential diagnosis includes small pseudocyst, peripancreatic lymphadenopathy, or pancreatic neoplasm. Consider continued followup by CT versus abdomen MRI without and with contrast if patient can cooperate with breath holding.   Electronically Signed   By: Earle Gell M.D.   On: 08/16/2014 15:29   Ct Abdomen Pelvis W Contrast  08/16/2014   CLINICAL DATA:  Sepsis.  Bowel obstruction.  Colon carcinoma.  EXAM: CT CHEST, ABDOMEN, AND PELVIS WITH CONTRAST  TECHNIQUE: Multidetector CT imaging of the chest, abdomen and pelvis was performed following the standard protocol during bolus administration of intravenous contrast.  CONTRAST:  128m OMNIPAQUE IOHEXOL 300 MG/ML  SOLN  COMPARISON:  Abdomen pelvis CT only on 05/20/2014  FINDINGS: CT CHEST FINDINGS  Mediastinum/Lymph Nodes: No masses or pathologically enlarged lymph nodes identified.  Lungs/Pleura: Atelectasis or infiltrate is seen in the lower lobes bilaterally. No evidence of pleural effusion.  Musculoskeletal/Soft Tissues: No suspicious bone lesions or other significant chest wall abnormality.  CT ABDOMEN AND PELVIS FINDINGS  Hepatobiliary: No masses or other significant abnormality identified. Gallbladder is unremarkable.  Pancreas: Diffuse pancreatic swelling is seen with mild peripancreatic inflammatory changes, consistent with acute pancreatitis. Ill-defined low-attenuation lesion at the pancreatic neck measures 1.9 x 2.1 cm on image 59/series 2, which is decreased in size from 2.4 x 2.8 cm previously.  This could represent a pancreatic pseudocyst, although pancreatic mass or peripancreatic lymphadenopathy cannot be excluded.  Spleen:  Within normal limits in size and appearance.  Adrenal Glands:  No mass identified.  Kidneys/Urinary Tract: No masses identified. No evidence of hydronephrosis.  Stomach/Bowel/Peritoneum: No evidence of wall thickening, mass, or obstruction. Minimal amount of free fluid noted in left lower quadrant mesentery, without associated bowel wall thickening.  Vascular/Lymphatic: No pathologically enlarged lymph nodes identified. No other significant abnormality identified.  Reproductive:  No mass or other significant abnormality identified.  Other:  None.  Musculoskeletal: No suspicious bone lesions identified. Previous lumbar spine fusion and severe right hip osteoarthritis again noted  IMPRESSION: Bilateral lower lobe atelectasis versus infiltrates.  Mild acute pancreatitis.  2.1 cm indeterminate low-attenuation lesion at the pancreatic neck has decreased in size from 2.8 cm previously. Differential diagnosis includes small pseudocyst, peripancreatic lymphadenopathy, or pancreatic neoplasm. Consider continued followup by CT versus abdomen MRI without and with contrast if patient can cooperate with breath holding.   Electronically Signed   By: JEarle GellM.D.   On: 08/16/2014 15:29   Dg Chest Port 1 View  08/20/2014   CLINICAL DATA:  Cough and weakness  EXAM: PORTABLE CHEST -  1 VIEW  COMPARISON:  08/18/2014  FINDINGS: Cardiac shadow is within normal limits. Bibasilar atelectatic changes are again noted. No focal infiltrate  IMPRESSION: Stable bibasilar atelectatic changes. No new focal abnormality is seen.   Electronically Signed   By: Inez Catalina M.D.   On: 08/20/2014 11:34   Dg Chest Port 1 View  08/18/2014   CLINICAL DATA:  Colon cancer.  Hypoxia.  EXAM: PORTABLE CHEST - 1 VIEW  COMPARISON:  CT chest 08/16/2014.  FINDINGS: The heart size is exaggerated by low lung volumes. Mild  pulmonary vascular congestion is again noted. Bilateral pleural effusions are evident. Mild bibasilar airspace disease is again noted the worse on the left. The right hemidiaphragm is elevated without change.  IMPRESSION: 1. Stable low volumes. 2. Small bilateral pleural effusions. 3. Persistent bibasilar airspace disease and. Given the effusions, this likely reflects atelectasis but infection is also considered.   Electronically Signed   By: San Morelle M.D.   On: 08/18/2014 09:18   Dg Chest Port 1 View  08/16/2014   CLINICAL DATA:  Shortness of breath.  Sepsis.  EXAM: PORTABLE CHEST - 1 VIEW  COMPARISON:  08/15/2014  FINDINGS: Low lung volumes again noted. Increased opacity is seen in the left retrocardiac lung base which may be due to atelectasis or pneumonia. Right lung remains clear. Heart size is within normal limits.  IMPRESSION: Increased left retrocardiac atelectasis versus infiltrate.   Electronically Signed   By: Earle Gell M.D.   On: 08/16/2014 13:16   Dg Swallowing Func-speech Pathology  08/21/2014    Objective Swallowing Evaluation:    Patient Details  Name: Paul Callahan MRN: [id number] Date of Birth: 1923/10/13  Today's Date: 08/21/2014 Time: SLP Start Time (ACUTE ONLY): 1047-SLP Stop Time (ACUTE ONLY): 1103 SLP Time Calculation (min) (ACUTE ONLY): 16 min  Past Medical History:  Past Medical History  Diagnosis Date  . Parkinson disease     'tremors are worsened at this time than before"  . Dementia   . Depression   . Bowel obstruction 12/15/2011  . Neuromuscular disorder     hx of parkinsons  . Arthritis   . Sepsis 05/20/2014  . Cancer     hx of colon cancer(surgery only)  . Impaired mobility     since hospital visit 11'15 overall weakness of legs -ambulates with  walker" with assist   Past Surgical History:  Past Surgical History  Procedure Laterality Date  . Cataracts Bilateral     '94,'95  . Hernia repair    . Back surgery      x 5  . Frozen shoulder    . Vasectomy    . Total shoulder  arthroplasty Left   . Lumbar fusion    . Spinal cord stimulator implant    . Colon surgery      '88 colon resection(cancer)  . Eye surgery      strabismus  repair '58, laser surgery also after cataract surgery  . Eus N/A 06/18/2014    Procedure: ESOPHAGEAL ENDOSCOPIC ULTRASOUND (EUS) RADIAL;  Surgeon:  Arta Silence, MD;  Location: [facility];  Service: Endoscopy;   Laterality: N/A;   HPI:  HPI: Pt is an 81 y.o. male who was brought to the ED due to complaints of  ABD pain since last night along with weakness and increased somnolence and  fevers today.He had been placed on Amoxicillin initially for a UTI, which  was changed to Augmentin/Clavulinic Acid 500 mg BID 1 days ago for  a UT  when his culture results returned.He had been having Urinary Frequency  and urgency for the past 2-3 weeks and was seen by his PCP and started on  an antibiotic Rx.  No Data Recorded  Assessment / Plan / Recommendation CHL IP CLINICAL IMPRESSIONS 08/21/2014  Dysphagia Diagnosis Mild pharyngeal phase dysphagia  Clinical impression Pt presents mild pharyngeal phase dysphagia. Pt has a  mild delay in swallow initiation to the level of the valleculae. Pt did  not penetrate or aspirate when asked to take large consecutive sips of  thin liquids. Recommend pt upgrade to dys 3/ thin liquid diet. Recommend  pt stay upright during meals; pt tends to lean towards his right side when  in a seated position, which places him at risk for aspiration. Pt coughed  during session, however, cough not related to aspiration or penetration.  Speech will follow-up for diet tolerance.       CHL IP TREATMENT RECOMMENDATION 08/21/2014  Treatment Plan Recommendations Therapy as outlined in treatment plan below      CHL IP DIET RECOMMENDATION 08/21/2014  Diet Recommendations Dysphagia 3 (Mechanical Soft);Thin liquid  Liquid Administration via Cup;Straw  Medication Administration Whole meds with puree  Compensations Slow rate;Small sips/bites  Postural Changes  and/or Swallow Maneuvers Seated upright 90 degrees     CHL IP OTHER RECOMMENDATIONS 08/21/2014  Recommended Consults (None)  Oral Care Recommendations Oral care BID  Other Recommendations (None)     CHL IP FOLLOW UP RECOMMENDATIONS 08/21/2014  Follow up Recommendations Skilled Nursing facility     [facility] IP FREQUENCY AND DURATION 08/21/2014  Speech Therapy Frequency (ACUTE ONLY) min 2x/week  Treatment Duration 2 weeks     Pertinent Vitals/Pain NA    SLP Swallow Goals No flowsheet data found.  No flowsheet data found.    CHL IP REASON FOR REFERRAL 08/21/2014  Reason for Referral Objectively evaluate swallowing function     CHL IP ORAL PHASE 08/21/2014  Lips (None)  Tongue (None)  Mucous membranes (None)  Nutritional status (None)  Other (None)  Oxygen therapy (None)  Oral Phase WFL  Oral - Pudding Teaspoon (None)  Oral - Pudding Cup (None)  Oral - Honey Teaspoon (None)  Oral - Honey Cup (None)  Oral - Honey Syringe (None)  Oral - Nectar Teaspoon (None)  Oral - Nectar Cup (None)  Oral - Nectar Straw (None)  Oral - Nectar Syringe (None)  Oral - Ice Chips (None)  Oral - Thin Teaspoon (None)  Oral - Thin Cup (None)  Oral - Thin Straw (None)  Oral - Thin Syringe (None)  Oral - Puree (None)  Oral - Mechanical Soft (None)  Oral - Regular (None)  Oral - Multi-consistency (None)  Oral - Pill (None)  Oral Phase - Comment (None)      CHL IP PHARYNGEAL PHASE 08/21/2014  Pharyngeal Phase Impaired  Pharyngeal - Pudding Teaspoon (None)  Penetration/Aspiration details (pudding teaspoon) (None)  Pharyngeal - Pudding Cup (None)  Penetration/Aspiration details (pudding cup) (None)  Pharyngeal - Honey Teaspoon (None)  Penetration/Aspiration details (honey teaspoon) (None)  Pharyngeal - Honey Cup (None)  Penetration/Aspiration details (honey cup) (None)  Pharyngeal - Honey Syringe (None)  Penetration/Aspiration details (honey syringe) (None)  Pharyngeal - Nectar Teaspoon (None)  Penetration/Aspiration details (nectar teaspoon) (None)  Pharyngeal  - Nectar Cup Delayed swallow initiation  Penetration/Aspiration details (nectar cup) (None)  Pharyngeal - Nectar Straw (None)  Penetration/Aspiration details (nectar straw) (None)  Pharyngeal - Nectar Syringe (None)  Penetration/Aspiration details (nectar syringe) (  None)  Pharyngeal - Ice Chips (None)  Penetration/Aspiration details (ice chips) (None)  Pharyngeal - Thin Teaspoon (None)  Penetration/Aspiration details (thin teaspoon) (None)  Pharyngeal - Thin Cup Delayed swallow initiation  Penetration/Aspiration details (thin cup) (None)  Pharyngeal - Thin Straw Delayed swallow initiation  Penetration/Aspiration details (thin straw) (None)  Pharyngeal - Thin Syringe (None)  Penetration/Aspiration details (thin syringe') (None)  Pharyngeal - Puree Delayed swallow initiation  Penetration/Aspiration details (puree) (None)  Pharyngeal - Mechanical Soft (None)  Penetration/Aspiration details (mechanical soft) (None)  Pharyngeal - Regular Delayed swallow initiation  Penetration/Aspiration details (regular) (None)  Pharyngeal - Multi-consistency (None)  Penetration/Aspiration details (multi-consistency) (None)  Pharyngeal - Pill Delayed swallow initiation  Penetration/Aspiration details (pill) (None)  Pharyngeal Comment (None)     No flowsheet data found.  No flowsheet data found.  Rodena Goldmann, SLP student Supervised and reviewed by Herbie Baltimore MA CCC-SLP        DeBlois, Katherene Ponto 08/21/2014, 12:04 PM       Microbiology: Recent Results (from the past 240 hour(s))  Culture, blood (routine x 2)     Status: None   Collection Time: 08/15/14  7:40 PM  Result Value Ref Range Status   Specimen Description BLOOD ARM RIGHT  Final   Special Requests BOTTLES DRAWN AEROBIC AND ANAEROBIC 5CC  Final   Culture   Final    NO GROWTH 5 DAYS Performed at [facility]    Report Status 08/22/2014 FINAL  Final  Culture, blood (routine x 2)     Status: None   Collection Time: 08/15/14  7:45 PM  Result  Value Ref Range Status   Specimen Description BLOOD HAND RIGHT  Final   Special Requests BOTTLES DRAWN AEROBIC ONLY 3CC  Final   Culture   Final    NO GROWTH 5 DAYS Performed at [facility]    Report Status 08/22/2014 FINAL  Final  Urine culture     Status: None   Collection Time: 08/15/14  8:45 PM  Result Value Ref Range Status   Specimen Description URINE, CATHETERIZED  Final   Special Requests NONE  Final   Colony Count NO GROWTH Performed at [facility]   Final   Culture NO GROWTH Performed at [facility]   Final   Report Status 08/17/2014 FINAL  Final  Culture, blood (routine x 2)     Status: None (Preliminary result)   Collection Time: 08/20/14  9:02 AM  Result Value Ref Range Status   Specimen Description BLOOD RIGHT HAND  Final   Special Requests BOTTLES DRAWN AEROBIC AND ANAEROBIC 4CC  Final   Culture   Final           BLOOD CULTURE RECEIVED NO GROWTH TO DATE CULTURE WILL BE HELD FOR 5 DAYS BEFORE ISSUING A FINAL NEGATIVE REPORT Performed at [facility]    Report Status PENDING  Incomplete  Culture, blood (routine x 2)     Status: None (Preliminary result)   Collection Time: 08/20/14  9:07 AM  Result Value Ref Range Status   Specimen Description BLOOD LEFT ANTECUBITAL  Final   Special Requests BOTTLES DRAWN AEROBIC AND ANAEROBIC 4CC  Final   Culture   Final           BLOOD CULTURE RECEIVED NO GROWTH TO DATE CULTURE WILL BE HELD FOR 5 DAYS BEFORE ISSUING A FINAL NEGATIVE REPORT Performed at [facility]    Report Status PENDING  Incomplete  Labs: Results for orders placed or performed during the hospital encounter of 08/15/14 (from the past 48 hour(s))  Amylase     Status: None   Collection Time: 08/24/14  4:35 PM  Result Value Ref Range   Amylase 28 0 - 105 U/L  Lipase, blood     Status: None   Collection Time: 08/24/14  4:35 PM  Result Value Ref Range   Lipase 38 11 - 59 U/L  CBC     Status: Abnormal    Collection Time: 08/25/14  6:17 AM  Result Value Ref Range   WBC 12.4 (H) 4.0 - 10.5 K/uL   RBC 3.85 (L) 4.22 - 5.81 MIL/uL   Hemoglobin 11.7 (L) 13.0 - 17.0 g/dL   HCT 35.3 (L) 39.0 - 52.0 %   MCV 91.7 78.0 - 100.0 fL   MCH 30.4 26.0 - 34.0 pg   MCHC 33.1 30.0 - 36.0 g/dL   RDW 14.5 11.5 - 15.5 %   Platelets 305 150 - 400 K/uL  Comprehensive metabolic panel     Status: Abnormal   Collection Time: 08/25/14  6:17 AM  Result Value Ref Range   Sodium 134 (L) 135 - 145 mmol/L   Potassium 3.9 3.5 - 5.1 mmol/L   Chloride 101 96 - 112 mmol/L   CO2 28 19 - 32 mmol/L   Glucose, Bld 173 (H) 70 - 99 mg/dL   BUN 15 6 - 23 mg/dL   Creatinine, Ser 0.82 0.50 - 1.35 mg/dL   Calcium 8.1 (L) 8.4 - 10.5 mg/dL   Total Protein 5.9 (L) 6.0 - 8.3 g/dL   Albumin 2.3 (L) 3.5 - 5.2 g/dL   AST 36 0 - 37 U/L   ALT 5 0 - 53 U/L   Alkaline Phosphatase 139 (H) 39 - 117 U/L   Total Bilirubin 0.7 0.3 - 1.2 mg/dL   GFR calc non Af Amer 77 (L) >90 mL/min   GFR calc Af Amer 89 (L) >90 mL/min    Comment: (NOTE) The eGFR has been calculated using the CKD EPI equation. This calculation has not been validated in all clinical situations. eGFR's persistently <90 mL/min signify possible Chronic Kidney Disease.    Anion gap 5 5 - 15  Lipase, blood     Status: None   Collection Time: 08/25/14  6:17 AM  Result Value Ref Range   Lipase 41 11 - 59 U/L     HPI :Appreciate ID. Paul Callahan is an 81 year old male with Parkinson's disease/dementia multiple drug allergies, among other medical problems, who came in with generalized weakness associated with shortness of breath and was found to be septic with white count of 19,000 despite outpatient antibiotics for close to a week(?Augmentin) given for UTI. Chest x-ray was unrevealing but this could be because patient was dry at admission. There is element of aspiration suggested by swallow evaluation done on 08/18/2014 SLP. Patient was hospitalized in November 2015 and at that  time blood cultures grew ENTEROBACTER AMNIGENUS. Urine culture/blood cultures so far unrevealing. [place] [address] on In[address]z. CT chest abdomen and pelvis showed "Bilateral lower lobe atelectasis versus infiltrates. Mild acute pancreatitis. 2.1 cm indeterminate low-attenuation lesion at the pancreatic neck has decreased in size from 2.8 cm previously. Differential diagnosis includes small pseudocyst, peripancreatic lymphadenopathy, or pancreatic neoplasm. Consider continued followup by CT versus abdomen MRI without and with contrast if patient can cooperate with breath holding". I spoke with [name] regarding EUS results-benign. Patient will need  short-term rehabilitation placement. He should be on dysphagia diet per speech therapy recommendations. He feels better today. He responds to questions appropriately but remains lethargic. His white count has improved to 13.1   He has continued to have some mild discomfort and there was some inflammation of the pancreas on CT.   He has been treated with Invanz 7 days as recommended by infectious disease, despite the fact that his cultures have had no growth. Infectious disease, Dr. Johnnye Sima was concerned about intra-abdominal process and infection in the pancreas.  They feel that the abdomen may be the source of fever since it does not appear that this is coming from his lungs. His CA 19 9 was elevated but slowly come down. The patient's wife notes that he is not really been eating and is not had a lot of abdominal pain but has complained of some mild abdominal pain.   HOSPITAL COURSE:  Sepsis/Aspiration pneumonia? Acute on chronic pancreatitis Pancreatic malignancy not ruled out /Atelectasis/UTI (outpatient culture showed Citrobacter)?/Leukocytosis improving  Culture no growth so far, completed 7 days of ertapenem for suspected aspiration pneumonia, due to elevated CA-19-9, repeat CT scan of the abdomen and pelvis showed progression of  changes of acute pancreatitis with increasing peripancreatic edema, cannot exclude pancreatic necrosis, now developing pseudocyst. , cannot get an MRI because he has a stimulator back  Abdomen:could be the source of his fever per infectious disease Discussed with infectious disease Discussed with [name], gastroenterology. He strongly recommended no further invasive testing. He suggested that pancreatitis could be playing a role in the patient's fever and his overall decline. We could consider making the patient nothing by mouth and start nasojejunal feeding, however the patient's family declined. According to him his overall prognosis is poor. Subsequently palliative care consultation was obtained and patient was found to be eligible for hospice Because of no beds available, patient will probably be discharged to SNF Patient started on Roxanol and lorazepam for comfort   Recurrent aspiration pneumonia  Continue Bronchodilators/oxygen supplementation as needed/incentive spirometry  Continue dysphagia 3 diet with nectar thick liquids  Repeat chest x-ray 2/17 obtained-stable by basilar changes, no new focal abnormality  Blood culture, urine culture remain negative since admission     Acute encephalopathy/Weakness/Dementia without behavioral disturbance/Parkinsonism  Continue Sinemet, Cymbalta, Lyrica, rivastigmine  Physical therapy recommends SNF,  [facility] a bed is available   Reviewed speech therapy recommendations-dysphagia 3 diet with nectar thick liquids   Mild acute pancreatitis/pancreatic lesion  No acute changes   Essential hypertension  Norvasc discontinued per family request, blood pressure stable   Discharge Exam: Blood pressure 125/75, pulse 80, temperature 98.6 F (37 C), temperature source Oral, resp. rate 16, height _0  (1.702 m), weight 81.3 kg (179 lb 3.7 oz), SpO2 96 %. General: Awake and answering questions Skin: No rash. IV site  normal Lungs: Clear. He has a loose cough Cor: Regular S1 and S2 with no murmur Abdomen: Soft and diffusely tender. No diarrhea reported by nurse        Discharge Instructions    Diet - low sodium heart healthy    Complete by:  As directed      Increase activity slowly    Complete by:  As directed            Follow-up Information    Follow up with [name], MD. Schedule an appointment as soon as possible for a visit in 1 week.   Specialty:  Internal Medicine   Contact  information:   [place], [place] [address] [phone]       Signed: Reyne Dumas 08/25/2014, 12:53 PM

## 2014-08-25 NOTE — Progress Notes (Signed)
Patient ID: Paul Callahan, male   DOB: January 03, 1924, 81 y.o.   MRN: [id number]         [facility]    Date of Admission:  08/15/2014   Off antibiotics for 48 hours         Principal Problem:   Sepsis Active Problems:   Parkinsonism   Dementia without behavioral disturbance   UTI (lower urinary tract infection)   Weakness   Leukocytosis   Acute encephalopathy   Blood poisoning   Enterobacter sepsis   Aspiration pneumonia   Atelectasis   Pancreatic lesion   Pancreatitis, acute   Dementia with behavioral disturbance   Dysphagia   Failure to thrive in adult   Essential hypertension   Pyrexia   . antiseptic oral rinse  7 mL Mouth Rinse BID  . beta carotene w/minerals  2 tablet Oral Daily  . carbidopa-levodopa  1 tablet Oral QHS  . carbidopa-levodopa  3 tablet Oral TID  . cholecalciferol  2,000 Units Oral Daily  . DULoxetine  60 mg Oral BID  . enoxaparin (LOVENOX) injection  40 mg Subcutaneous Q24H  . multivitamin with minerals  1 tablet Oral Daily  . pregabalin  150 mg Oral BID  . Rivastigmine  13.3 mg Transdermal Daily  . zinc sulfate  220 mg Oral q1800    Review of Systems: Review of systems not obtained due to patient factors.  Past Medical History  Diagnosis Date  . Parkinson disease     'tremors are worsened at this time than before"  . Dementia   . Depression   . Bowel obstruction 12/15/2011  . Neuromuscular disorder     hx of parkinsons  . Arthritis   . Sepsis 05/20/2014  . Cancer     hx of colon cancer(surgery only)  . Impaired mobility     since hospital visit 11'15 overall weakness of legs -ambulates with walker" with assist    History  Substance Use Topics  . Smoking status: Former Research scientist (life sciences)  . Smokeless tobacco: Never Used  . Alcohol Use: No    History reviewed. No pertinent family history. Allergies  Allergen Reactions  . Amantadines Other (See Comments)    Weakness in legs and hallucinations  . Fentanyl Other  (See Comments)    Confusion and disorientation   . Gabapentin Other (See Comments)    Weakness in legs and hallucinations  . Oxycodone Other (See Comments)    Confusion and disorientation   . Phenergan [Promethazine Hcl] Other (See Comments)    Confusion and disorientation   . Vancomycin Itching, Rash and Other (See Comments)    Red man's syndrome   . Ciprofloxacin Diarrhea and Rash    OBJECTIVE: Blood pressure 125/75, pulse 80, temperature 98.6 F (37 C), temperature source Oral, resp. rate 16, height 5\' 7"  (1.702 m), weight 179 lb 3.7 oz (81.3 kg), SpO2 96 %. General: He does not respond to questions. His wife is at the bedside Skin: No rash Lungs: Clear anteriorly Cor: Regular S1 and S2 with no murmur  Abdomen: soft and not obviously tender  Lab Results Lab Results  Component Value Date   WBC 12.4* 08/25/2014   HGB 11.7* 08/25/2014   HCT 35.3* 08/25/2014   MCV 91.7 08/25/2014   PLT 305 08/25/2014    Lab Results  Component Value Date   CREATININE 0.82 08/25/2014   BUN 15 08/25/2014   NA 134* 08/25/2014   K 3.9 08/25/2014   CL 101 08/25/2014  CO2 28 08/25/2014    Lab Results  Component Value Date   ALT 5 08/25/2014   AST 36 08/25/2014   ALKPHOS 139* 08/25/2014   BILITOT 0.7 08/25/2014     Microbiology: Recent Results (from the past 240 hour(s))  Culture, blood (routine x 2)     Status: None   Collection Time: 08/15/14  7:40 PM  Result Value Ref Range Status   Specimen Description BLOOD ARM RIGHT  Final   Special Requests BOTTLES DRAWN AEROBIC AND ANAEROBIC 5CC  Final   Culture   Final    NO GROWTH 5 DAYS Performed at [facility]    Report Status 08/22/2014 FINAL  Final  Culture, blood (routine x 2)     Status: None   Collection Time: 08/15/14  7:45 PM  Result Value Ref Range Status   Specimen Description BLOOD HAND RIGHT  Final   Special Requests BOTTLES DRAWN AEROBIC ONLY 3CC  Final   Culture   Final    NO GROWTH 5 DAYS Performed at  [facility]    Report Status 08/22/2014 FINAL  Final  Urine culture     Status: None   Collection Time: 08/15/14  8:45 PM  Result Value Ref Range Status   Specimen Description URINE, CATHETERIZED  Final   Special Requests NONE  Final   Colony Count NO GROWTH Performed at [facility]   Final   Culture NO GROWTH Performed at [facility]   Final   Report Status 08/17/2014 FINAL  Final  Culture, blood (routine x 2)     Status: None (Preliminary result)   Collection Time: 08/20/14  9:02 AM  Result Value Ref Range Status   Specimen Description BLOOD RIGHT HAND  Final   Special Requests BOTTLES DRAWN AEROBIC AND ANAEROBIC 4CC  Final   Culture   Final           BLOOD CULTURE RECEIVED NO GROWTH TO DATE CULTURE WILL BE HELD FOR 5 DAYS BEFORE ISSUING A FINAL NEGATIVE REPORT Performed at [facility]    Report Status PENDING  Incomplete  Culture, blood (routine x 2)     Status: None (Preliminary result)   Collection Time: 08/20/14  9:07 AM  Result Value Ref Range Status   Specimen Description BLOOD LEFT ANTECUBITAL  Final   Special Requests BOTTLES DRAWN AEROBIC AND ANAEROBIC 4CC  Final   Culture   Final           BLOOD CULTURE RECEIVED NO GROWTH TO DATE CULTURE WILL BE HELD FOR 5 DAYS BEFORE ISSUING A FINAL NEGATIVE REPORT Performed at [facility]    Report Status PENDING  Incomplete   CT ABDOMEN AND PELVIS WITHOUT AND WITH CONTRAST 08/24/2014  IMPRESSION: Progression of changes of acute pancreatitis with increasing pancreatic and peripancreatic edema. Can't exclude early focal areas of pancreatic necrosis. No developing pseudo cyst or walled-off necrosis. Increasing bilateral pleural effusions and basilar atelectasis/ consolidation.   Electronically Signed  By: Lucienne Capers M.D.  On: 08/24/2014 21:27   Assessment: I agree that recent fevers are probably due at least in part to worsening pancreatitis. I recommend  continuing observation off of antibiotics. His wife is eager to meet with the palliative care team and wants to consider transfer to [facility].  Plan: 1. Observe off of antibiotics 2. I will follow-up on Wednesday, 08/27/2014 if he is still here  Michel Bickers, [place] for [facility] [phone]  pager   [phone] cell 08/25/2014, 12:11 PM

## 2014-08-25 NOTE — Consult Note (Signed)
Patient Paul Callahan      DOB: [birth year]      WEX:[id number]     Consult Note from the Palliative Medicine Team at [facility] Requested by: Dr. Allyson Sabal     PCP: [name], MD Reason for Consultation: [place]     Phone [phone]  Assessment of patients Current state: I met today with Mrs. Gardella and their daughter outside the room. They are very confused with differing opinions from providers - one recommending hospice and comfort care and the other recommending SNF rehab. I explained that I focus more on functional status and they tell me he is not eating (he will eat his desserts but not much more) or drinking much and sleeping a lot. His daughter tells me that she thought that he was going to die in November when he was admitted with severe sepsis. They tell me that he recovered from that illness much better than anticipated. He was needing assistance with all ADLs but could ambulate to the bathroom with assistance and feed himself. With his h/o Parkinsonism and dementia we know that recovery would be difficult and I share their concern about him being able to participate in rehab. Mrs. Ehrman tells me that he has so many health issues that she foresees worsening quality of life and suffering and would rather focus on comfort now and if he declines they accept this as his time. They are very realistic and want what is best for Mr. Guardia although they have been very confused about mixed information this hospitalization. I agree he is a difficult case but we also know that recovery would be very difficult for him. Agree with family that hospice facility would be appropriate for him and will ask CSW to help consult with hospice.    Goals of Care: 1.  Code Status: DNR   2. Disposition: Hopeful for hospice facility.    3. Symptom Management:   1. BLE Neuropathy Pain: Lyrica has helped with this pain per family - continue. He does say "my legs hurt" during my assessment  but drifts back to sleep. May utilize low dose roxanol if needed for pain.  2. Decreased appetite: Encourage small frequent meals/foods he likes. Give feeding supplement if he is willing.   4. Psychosocial: Emotional support provided to patient and family.    Brief HPI: 81 yo male admitted with fever and weakness r/t UTI and pancreatitis with possible early necrosis. He has been treated for UTI and possible aspiration pneumonia with continued weakness and decreased intake with worsening pancreatitis. They do not want to consider NPO or feeding tubes. PMH reviewed below and with family.    ROS: + bilat leg pain    PMH:  Past Medical History  Diagnosis Date  . Parkinson disease     'tremors are worsened at this time than before"  . Dementia   . Depression   . Bowel obstruction 12/15/2011  . Neuromuscular disorder     hx of parkinsons  . Arthritis   . Sepsis 05/20/2014  . Cancer     hx of colon cancer(surgery only)  . Impaired mobility     since hospital visit 11'15 overall weakness of legs -ambulates with walker" with assist     PSH: Past Surgical History  Procedure Laterality Date  . Cataracts Bilateral     '94,'95  . Hernia repair    . Back surgery      x 5  . Frozen shoulder    .  Vasectomy    . Total shoulder arthroplasty Left   . Lumbar fusion    . Spinal cord stimulator implant    . Colon surgery      '88 colon resection(cancer)  . Eye surgery      strabismus  repair '58, laser surgery also after cataract surgery  . Eus N/A 06/18/2014    Procedure: ESOPHAGEAL ENDOSCOPIC ULTRASOUND (EUS) RADIAL;  Surgeon: Arta Silence, MD;  Location: [facility];  Service: Endoscopy;  Laterality: N/A;   I have reviewed the [place] and SH and  If appropriate update it with new information. Allergies  Allergen Reactions  . Amantadines Other (See Comments)    Weakness in legs and hallucinations  . Fentanyl Other (See Comments)    Confusion and disorientation   . Gabapentin Other  (See Comments)    Weakness in legs and hallucinations  . Oxycodone Other (See Comments)    Confusion and disorientation   . Phenergan [Promethazine Hcl] Other (See Comments)    Confusion and disorientation   . Vancomycin Itching, Rash and Other (See Comments)    Red man's syndrome   . Ciprofloxacin Diarrhea and Rash   Scheduled Meds: . antiseptic oral rinse  7 mL Mouth Rinse BID  . beta carotene w/minerals  2 tablet Oral Daily  . carbidopa-levodopa  1 tablet Oral QHS  . carbidopa-levodopa  3 tablet Oral TID  . cholecalciferol  2,000 Units Oral Daily  . DULoxetine  60 mg Oral BID  . enoxaparin (LOVENOX) injection  40 mg Subcutaneous Q24H  . multivitamin with minerals  1 tablet Oral Daily  . pregabalin  150 mg Oral BID  . Rivastigmine  13.3 mg Transdermal Daily  . zinc sulfate  220 mg Oral q1800   Continuous Infusions: . sodium chloride 75 mL/hr at 08/24/14 1245   PRN Meds:.acetaminophen **OR** acetaminophen, albuterol, alum & mag hydroxide-simeth, food thickener, hydrALAZINE, HYDROmorphone (DILAUDID) injection, ondansetron **OR** ondansetron (ZOFRAN) IV    BP 125/75 mmHg  Pulse 80  Temp(Src) 98.6 F (37 C) (Oral)  Resp 16  Ht _0  (1.702 m)  Wt 81.3 kg (179 lb 3.7 oz)  BMI 28.07 kg/m2  SpO2 96%   PPS: 20% at best   Intake/Output Summary (Last 24 hours) at 08/25/14 1324 Last data filed at 08/25/14 0810  Gross per 24 hour  Intake    240 ml  Output   1025 ml  Net   -785 ml   LBM: 2/22                       Physical Exam:  General: NAD, lying in recliner HEENT: [place]/AT, no JVD, moist mucous membranes Chest: No labored breathing, symmetric CVS: RRR Abdomen: Soft, NT, ND Ext: MAE, trace BLE edema, warm to touch Neuro: Arousable, oriented to person only but pleasant and appropriate  Labs: CBC    Component Value Date/Time   WBC 12.4* 08/25/2014 0617   RBC 3.85* 08/25/2014 0617   HGB 11.7* 08/25/2014 0617   HCT 35.3* 08/25/2014 0617   PLT 305 08/25/2014 0617    MCV 91.7 08/25/2014 0617   MCH 30.4 08/25/2014 0617   MCHC 33.1 08/25/2014 0617   RDW 14.5 08/25/2014 0617   LYMPHSABS 0.6* 08/18/2014 0719   MONOABS 0.8 08/18/2014 0719   EOSABS 0.0 08/18/2014 0719   BASOSABS 0.0 08/18/2014 0719    BMET    Component Value Date/Time   NA 134* 08/25/2014 0617   K 3.9 08/25/2014 0617  CL 101 08/25/2014 0617   CO2 28 08/25/2014 0617   GLUCOSE 173* 08/25/2014 0617   BUN 15 08/25/2014 0617   CREATININE 0.82 08/25/2014 0617   CALCIUM 8.1* 08/25/2014 0617   GFRNONAA 77* 08/25/2014 0617   GFRAA 89* 08/25/2014 0617    CMP     Component Value Date/Time   NA 134* 08/25/2014 0617   K 3.9 08/25/2014 0617   CL 101 08/25/2014 0617   CO2 28 08/25/2014 0617   GLUCOSE 173* 08/25/2014 0617   BUN 15 08/25/2014 0617   CREATININE 0.82 08/25/2014 0617   CALCIUM 8.1* 08/25/2014 0617   PROT 5.9* 08/25/2014 0617   ALBUMIN 2.3* 08/25/2014 0617   AST 36 08/25/2014 0617   ALT 5 08/25/2014 0617   ALKPHOS 139* 08/25/2014 0617   BILITOT 0.7 08/25/2014 0617   GFRNONAA 77* 08/25/2014 0617   GFRAA 45* 08/25/2014 0617     Time In Time Out Total Time Spent with Patient Total Overall Time  1200 1320 16mn 864m    Greater than 50%  of this time was spent counseling and coordinating care related to the above assessment and plan.  AlVinie SillNP Palliative Medicine Team Pager # [phone]-F 8a-5p) Team Phone # [phone]/Weekends)

## 2014-08-26 LAB — CULTURE, BLOOD (ROUTINE X 2)
CULTURE: NO GROWTH
Culture: NO GROWTH

## 2014-08-26 NOTE — Progress Notes (Signed)
CARE MANAGEMENT NOTE 08/26/2014  Patient:  Paul Callahan, Paul Callahan   Account Number:  [id number]  Date Initiated:  08/20/2014  Documentation initiated by:  [facility]  Subjective/Objective Assessment:   Sepsis, Asp Pneumonia, UTI, Acute Encephalopathy     Action/Plan:   plan SNF placement   Anticipated DC Date:  08/22/2014   Anticipated DC Plan:  [place]  In-house referral  Clinical Social Worker      DC Planning Services  CM consult      Choice offered to / List presented to:             Status of service:  Completed, signed off Medicare Important Message given?  YES (If response is "NO", the following Medicare IM given date fields will be blank) Date Medicare IM given:  08/20/2014 Medicare IM given by:  [facility] Date Additional Medicare IM given:  08/26/2014 Additional Medicare IM given by:  Jonnie Finner  Discharge Disposition:  [place]  Per UR Regulation:  Reviewed for med. necessity/level of care/duration of stay  If discussed at [place] of Stay Meetings, dates discussed:    Comments:  08/20/2014 1415 Chart reviewed. CSW following for SNF placement. NCM spoke to wife and son. No NCM needs identified. Jonnie Finner RN CCM Case Mgmt phone [phone]

## 2014-08-26 NOTE — Clinical Social Work Note (Signed)
Per MD, patient is ready to be  discharged to [facility] today. Transport has been called. The updated  discharge summary has been faxed to [facility] also.     Gwenevere Abbot CSW-Intern [phone]

## 2014-08-26 NOTE — Discharge Summary (Signed)
Physician Discharge Summary  Paul Callahan MRN: [id number] DOB/AGE: [id number] y.o.  PCP: [name], MD   Admit date: 08/15/2014 Discharge date: 08/26/2014  Discharge Diagnoses:  Patient hospice eligible   Sepsis Active Problems:   Parkinsonism   Dementia without behavioral disturbance   UTI (lower urinary tract infection)   Weakness   Leukocytosis   Acute encephalopathy   Blood poisoning   Enterobacter sepsis   Aspiration pneumonia   Atelectasis   Pancreatic lesion   Pancreatitis, acute   Dementia with behavioral disturbance   Dysphagia   Failure to thrive in adult   Essential hypertension   Pyrexia  Follow-up recommendations Hospice to follow patient upon discharge Patient will discharge to [facility] No repeat hospitalizations, lab draws, invasive workup     Medication List    STOP taking these medications        amoxicillin-clavulanate 875-125 MG per tablet  Commonly known as:  AUGMENTIN     CALCIUM 600 + D PO     celecoxib 200 MG capsule  Commonly known as:  CELEBREX     multivitamin with minerals Tabs tablet     PRESERVISION AREDS 2 Caps     zinc gluconate 50 MG tablet      TAKE these medications        acetaminophen 325 MG tablet  Commonly known as:  TYLENOL  Take 2 tablets (650 mg total) by mouth every 6 (six) hours as needed for mild pain (or Fever >/= 101).     Biotin 3 MG Tabs  Take 6 mg by mouth every evening.     carbidopa-levodopa 25-100 MG per tablet  Commonly known as:  SINEMET IR  Take 3 tablets by mouth 3 (three) times daily.     carbidopa-levodopa 25-100 MG per tablet  Commonly known as:  SINEMET CR  Take 1 tablet by mouth at bedtime.     carvedilol 3.125 MG tablet  Commonly known as:  COREG  Take 1 tablet (3.125 mg total) by mouth 2 (two) times daily with a meal.     cholecalciferol 1000 UNITS tablet  Commonly known as:  VITAMIN D  Take 2,000 Units by mouth daily.     DULoxetine 60 MG capsule   Commonly known as:  CYMBALTA  Take 60 mg by mouth 2 (two) times daily.     EXELON 13.3 MG/24HR Pt24  Generic drug:  Rivastigmine  Place 13.3 mg onto the skin daily.     food thickener Powd  Commonly known as:  THICK IT  Take 1 Container by mouth as needed (For nectar thick liquids).     LORazepam 2 MG/ML concentrated solution  Commonly known as:  LORAZEPAM INTENSOL  Take 0.5 mLs (1 mg total) by mouth every 8 (eight) hours as needed for anxiety.     morphine CONCENTRATE 10 mg / 0.5 ml concentrated solution  Take 0.25 mLs (5 mg total) by mouth every 2 (two) hours as needed for severe pain.     pregabalin 150 MG capsule  Commonly known as:  LYRICA  Take 150 mg by mouth 2 (two) times daily.     RISAQUAD-2 Caps  1 PO twice a day        Discharge Condition: Disposition: 01-Home or Self Care   Consults: * Gastroenterology Infectious disease Palliative care    Significant Diagnostic Studies: Ct Abdomen Pelvis W Wo Contrast  08/24/2014   CLINICAL DATA:  Generalized abdominal pain. Previous CT demonstrated bilateral lower  lobe atelectasis versus infiltrates. Mild acute pancreatitis. Indeterminate lesion in the pancreatic neck. Followup CT is obtained.  EXAM: CT ABDOMEN AND PELVIS WITHOUT AND WITH CONTRAST  TECHNIQUE: Multidetector CT imaging of the abdomen and pelvis was performed following the standard protocol before and following the bolus administration of intravenous contrast.  CONTRAST:  170m OMNIPAQUE IOHEXOL 300 MG/ML  SOLN  COMPARISON:  08/16/2014  FINDINGS: Small bilateral pleural effusions with bilateral basilar atelectasis or consolidation, increasing since the prior study.  Residual contrast material in the stomach and bowel as well as surgical hardware in the spine creates streak artifact, limiting evaluation. Examination is also technically limited due to motion artifact. Noncontrast imaging demonstrates no significant pancreatic calcifications. Vascular  calcifications in the aorta and branch vessels.  Diffuse infiltration and edema in around the pancreas. The is is consistent with progression of changes of acute pancreatitis since previous study. Parenchymal enhancement pattern is somewhat heterogeneous. Small focal areas of necrosis are not excluded. No pancreatic ductal dilatation. No developing pseudocysts, walled-off necrosis, or peripancreatic fluid collections. The low-attenuation seen previously in the pancreatic neck is obscured by the diffuse pancreatic process. Small amount of free fluid and edema extending along the anterior perirenal fascia and along the pericolic gutters bilaterally.  Visualized liver, spleen, gallbladder, adrenal glands, kidneys, inferior vena cava, and retroperitoneal lymph nodes are unremarkable. Abdominal aorta is patent without evidence of aneurysm. Abdominal aortic calcifications are present. Stomach, small bowel, and colon are not abnormally distended. No free air in the abdomen. Anterior abdominal wall defect containing fat herniation.  Pelvis: No free or loculated pelvic fluid collections. Prostate gland is enlarged with calcifications. Bladder wall is thickened and may suggest cystitis. Appendix is normal. No pelvic mass or lymphadenopathy. Postoperative changes with posterior fixation of the lumbar spine. Degenerative changes throughout the lumbar spine.  IMPRESSION: Progression of changes of acute pancreatitis with increasing pancreatic and peripancreatic edema. Can't exclude early focal areas of pancreatic necrosis. No developing pseudo cyst or walled-off necrosis. Increasing bilateral pleural effusions and basilar atelectasis/ consolidation.   Electronically Signed   By: WLucienne CapersM.D.   On: 08/24/2014 21:27   Dg Chest 2 View  08/15/2014   CLINICAL DATA:  Dementia.  Urosepsis.  Weakness.  EXAM: CHEST  2 VIEW  COMPARISON:  05/22/14  FINDINGS: Low lung volumes are again noted. Both lungs remain clear. No evidence  of pleural effusion. Left shoulder prosthesis is again demonstrated.  IMPRESSION: Stable low lung volumes.  No acute findings.   Electronically Signed   By: JEarle GellM.D.   On: 08/15/2014 20:48   Ct Chest W Contrast  08/16/2014   CLINICAL DATA:  Sepsis.  Bowel obstruction.  Colon carcinoma.  EXAM: CT CHEST, ABDOMEN, AND PELVIS WITH CONTRAST  TECHNIQUE: Multidetector CT imaging of the chest, abdomen and pelvis was performed following the standard protocol during bolus administration of intravenous contrast.  CONTRAST:  1058mOMNIPAQUE IOHEXOL 300 MG/ML  SOLN  COMPARISON:  Abdomen pelvis CT only on 05/20/2014  FINDINGS: CT CHEST FINDINGS  Mediastinum/Lymph Nodes: No masses or pathologically enlarged lymph nodes identified.  Lungs/Pleura: Atelectasis or infiltrate is seen in the lower lobes bilaterally. No evidence of pleural effusion.  Musculoskeletal/Soft Tissues: No suspicious bone lesions or other significant chest wall abnormality.  CT ABDOMEN AND PELVIS FINDINGS  Hepatobiliary: No masses or other significant abnormality identified. Gallbladder is unremarkable.  Pancreas: Diffuse pancreatic swelling is seen with mild peripancreatic inflammatory changes, consistent with acute pancreatitis. Ill-defined low-attenuation lesion at the pancreatic  neck measures 1.9 x 2.1 cm on image 59/series 2, which is decreased in size from 2.4 x 2.8 cm previously. This could represent a pancreatic pseudocyst, although pancreatic mass or peripancreatic lymphadenopathy cannot be excluded.  Spleen:  Within normal limits in size and appearance.  Adrenal Glands:  No mass identified.  Kidneys/Urinary Tract: No masses identified. No evidence of hydronephrosis.  Stomach/Bowel/Peritoneum: No evidence of wall thickening, mass, or obstruction. Minimal amount of free fluid noted in left lower quadrant mesentery, without associated bowel wall thickening.  Vascular/Lymphatic: No pathologically enlarged lymph nodes identified. No other  significant abnormality identified.  Reproductive:  No mass or other significant abnormality identified.  Other:  None.  Musculoskeletal: No suspicious bone lesions identified. Previous lumbar spine fusion and severe right hip osteoarthritis again noted  IMPRESSION: Bilateral lower lobe atelectasis versus infiltrates.  Mild acute pancreatitis.  2.1 cm indeterminate low-attenuation lesion at the pancreatic neck has decreased in size from 2.8 cm previously. Differential diagnosis includes small pseudocyst, peripancreatic lymphadenopathy, or pancreatic neoplasm. Consider continued followup by CT versus abdomen MRI without and with contrast if patient can cooperate with breath holding.   Electronically Signed   By: Earle Gell M.D.   On: 08/16/2014 15:29   Ct Abdomen Pelvis W Contrast  08/16/2014   CLINICAL DATA:  Sepsis.  Bowel obstruction.  Colon carcinoma.  EXAM: CT CHEST, ABDOMEN, AND PELVIS WITH CONTRAST  TECHNIQUE: Multidetector CT imaging of the chest, abdomen and pelvis was performed following the standard protocol during bolus administration of intravenous contrast.  CONTRAST:  128m OMNIPAQUE IOHEXOL 300 MG/ML  SOLN  COMPARISON:  Abdomen pelvis CT only on 05/20/2014  FINDINGS: CT CHEST FINDINGS  Mediastinum/Lymph Nodes: No masses or pathologically enlarged lymph nodes identified.  Lungs/Pleura: Atelectasis or infiltrate is seen in the lower lobes bilaterally. No evidence of pleural effusion.  Musculoskeletal/Soft Tissues: No suspicious bone lesions or other significant chest wall abnormality.  CT ABDOMEN AND PELVIS FINDINGS  Hepatobiliary: No masses or other significant abnormality identified. Gallbladder is unremarkable.  Pancreas: Diffuse pancreatic swelling is seen with mild peripancreatic inflammatory changes, consistent with acute pancreatitis. Ill-defined low-attenuation lesion at the pancreatic neck measures 1.9 x 2.1 cm on image 59/series 2, which is decreased in size from 2.4 x 2.8 cm previously.  This could represent a pancreatic pseudocyst, although pancreatic mass or peripancreatic lymphadenopathy cannot be excluded.  Spleen:  Within normal limits in size and appearance.  Adrenal Glands:  No mass identified.  Kidneys/Urinary Tract: No masses identified. No evidence of hydronephrosis.  Stomach/Bowel/Peritoneum: No evidence of wall thickening, mass, or obstruction. Minimal amount of free fluid noted in left lower quadrant mesentery, without associated bowel wall thickening.  Vascular/Lymphatic: No pathologically enlarged lymph nodes identified. No other significant abnormality identified.  Reproductive:  No mass or other significant abnormality identified.  Other:  None.  Musculoskeletal: No suspicious bone lesions identified. Previous lumbar spine fusion and severe right hip osteoarthritis again noted  IMPRESSION: Bilateral lower lobe atelectasis versus infiltrates.  Mild acute pancreatitis.  2.1 cm indeterminate low-attenuation lesion at the pancreatic neck has decreased in size from 2.8 cm previously. Differential diagnosis includes small pseudocyst, peripancreatic lymphadenopathy, or pancreatic neoplasm. Consider continued followup by CT versus abdomen MRI without and with contrast if patient can cooperate with breath holding.   Electronically Signed   By: JEarle GellM.D.   On: 08/16/2014 15:29   Dg Chest Port 1 View  08/20/2014   CLINICAL DATA:  Cough and weakness  EXAM: PORTABLE CHEST -  1 VIEW  COMPARISON:  08/18/2014  FINDINGS: Cardiac shadow is within normal limits. Bibasilar atelectatic changes are again noted. No focal infiltrate  IMPRESSION: Stable bibasilar atelectatic changes. No new focal abnormality is seen.   Electronically Signed   By: Inez Catalina M.D.   On: 08/20/2014 11:34   Dg Chest Port 1 View  08/18/2014   CLINICAL DATA:  Colon cancer.  Hypoxia.  EXAM: PORTABLE CHEST - 1 VIEW  COMPARISON:  CT chest 08/16/2014.  FINDINGS: The heart size is exaggerated by low lung volumes. Mild  pulmonary vascular congestion is again noted. Bilateral pleural effusions are evident. Mild bibasilar airspace disease is again noted the worse on the left. The right hemidiaphragm is elevated without change.  IMPRESSION: 1. Stable low volumes. 2. Small bilateral pleural effusions. 3. Persistent bibasilar airspace disease and. Given the effusions, this likely reflects atelectasis but infection is also considered.   Electronically Signed   By: San Morelle M.D.   On: 08/18/2014 09:18   Dg Chest Port 1 View  08/16/2014   CLINICAL DATA:  Shortness of breath.  Sepsis.  EXAM: PORTABLE CHEST - 1 VIEW  COMPARISON:  08/15/2014  FINDINGS: Low lung volumes again noted. Increased opacity is seen in the left retrocardiac lung base which may be due to atelectasis or pneumonia. Right lung remains clear. Heart size is within normal limits.  IMPRESSION: Increased left retrocardiac atelectasis versus infiltrate.   Electronically Signed   By: Earle Gell M.D.   On: 08/16/2014 13:16   Dg Swallowing Func-speech Pathology  08/21/2014    Objective Swallowing Evaluation:    Patient Details  Name: Paul Callahan MRN: [id number] Date of Birth: 1923/10/13  Today's Date: 08/21/2014 Time: SLP Start Time (ACUTE ONLY): 1047-SLP Stop Time (ACUTE ONLY): 1103 SLP Time Calculation (min) (ACUTE ONLY): 16 min  Past Medical History:  Past Medical History  Diagnosis Date  . Parkinson disease     'tremors are worsened at this time than before"  . Dementia   . Depression   . Bowel obstruction 12/15/2011  . Neuromuscular disorder     hx of parkinsons  . Arthritis   . Sepsis 05/20/2014  . Cancer     hx of colon cancer(surgery only)  . Impaired mobility     since hospital visit 11'15 overall weakness of legs -ambulates with  walker" with assist   Past Surgical History:  Past Surgical History  Procedure Laterality Date  . Cataracts Bilateral     '94,'95  . Hernia repair    . Back surgery      x 5  . Frozen shoulder    . Vasectomy    . Total shoulder  arthroplasty Left   . Lumbar fusion    . Spinal cord stimulator implant    . Colon surgery      '88 colon resection(cancer)  . Eye surgery      strabismus  repair '58, laser surgery also after cataract surgery  . Eus N/A 06/18/2014    Procedure: ESOPHAGEAL ENDOSCOPIC ULTRASOUND (EUS) RADIAL;  Surgeon:  Arta Silence, MD;  Location: [facility] ENDOSCOPY;  Service: Endoscopy;   Laterality: N/A;   HPI:  HPI: Pt is an 81 y.o. male who was brought to the ED due to complaints of  ABD pain since last night along with weakness and increased somnolence and  fevers today.He had been placed on Amoxicillin initially for a UTI, which  was changed to Augmentin/Clavulinic Acid 500 mg BID 1 days ago for  a UT  when his culture results returned.He had been having Urinary Frequency  and urgency for the past 2-3 weeks and was seen by his PCP and started on  an antibiotic Rx.  No Data Recorded  Assessment / Plan / Recommendation CHL IP CLINICAL IMPRESSIONS 08/21/2014  Dysphagia Diagnosis Mild pharyngeal phase dysphagia  Clinical impression Pt presents mild pharyngeal phase dysphagia. Pt has a  mild delay in swallow initiation to the level of the valleculae. Pt did  not penetrate or aspirate when asked to take large consecutive sips of  thin liquids. Recommend pt upgrade to dys 3/ thin liquid diet. Recommend  pt stay upright during meals; pt tends to lean towards his right side when  in a seated position, which places him at risk for aspiration. Pt coughed  during session, however, cough not related to aspiration or penetration.  Speech will follow-up for diet tolerance.       CHL IP TREATMENT RECOMMENDATION 08/21/2014  Treatment Plan Recommendations Therapy as outlined in treatment plan below      CHL IP DIET RECOMMENDATION 08/21/2014  Diet Recommendations Dysphagia 3 (Mechanical Soft);Thin liquid  Liquid Administration via Cup;Straw  Medication Administration Whole meds with puree  Compensations Slow rate;Small sips/bites  Postural Changes  and/or Swallow Maneuvers Seated upright 90 degrees     CHL IP OTHER RECOMMENDATIONS 08/21/2014  Recommended Consults (None)  Oral Care Recommendations Oral care BID  Other Recommendations (None)     CHL IP FOLLOW UP RECOMMENDATIONS 08/21/2014  Follow up Recommendations Skilled Nursing facility     [facility] IP FREQUENCY AND DURATION 08/21/2014  Speech Therapy Frequency (ACUTE ONLY) min 2x/week  Treatment Duration 2 weeks     Pertinent Vitals/Pain NA    SLP Swallow Goals No flowsheet data found.  No flowsheet data found.    CHL IP REASON FOR REFERRAL 08/21/2014  Reason for Referral Objectively evaluate swallowing function     CHL IP ORAL PHASE 08/21/2014  Lips (None)  Tongue (None)  Mucous membranes (None)  Nutritional status (None)  Other (None)  Oxygen therapy (None)  Oral Phase WFL  Oral - Pudding Teaspoon (None)  Oral - Pudding Cup (None)  Oral - Honey Teaspoon (None)  Oral - Honey Cup (None)  Oral - Honey Syringe (None)  Oral - Nectar Teaspoon (None)  Oral - Nectar Cup (None)  Oral - Nectar Straw (None)  Oral - Nectar Syringe (None)  Oral - Ice Chips (None)  Oral - Thin Teaspoon (None)  Oral - Thin Cup (None)  Oral - Thin Straw (None)  Oral - Thin Syringe (None)  Oral - Puree (None)  Oral - Mechanical Soft (None)  Oral - Regular (None)  Oral - Multi-consistency (None)  Oral - Pill (None)  Oral Phase - Comment (None)      CHL IP PHARYNGEAL PHASE 08/21/2014  Pharyngeal Phase Impaired  Pharyngeal - Pudding Teaspoon (None)  Penetration/Aspiration details (pudding teaspoon) (None)  Pharyngeal - Pudding Cup (None)  Penetration/Aspiration details (pudding cup) (None)  Pharyngeal - Honey Teaspoon (None)  Penetration/Aspiration details (honey teaspoon) (None)  Pharyngeal - Honey Cup (None)  Penetration/Aspiration details (honey cup) (None)  Pharyngeal - Honey Syringe (None)  Penetration/Aspiration details (honey syringe) (None)  Pharyngeal - Nectar Teaspoon (None)  Penetration/Aspiration details (nectar teaspoon) (None)  Pharyngeal  - Nectar Cup Delayed swallow initiation  Penetration/Aspiration details (nectar cup) (None)  Pharyngeal - Nectar Straw (None)  Penetration/Aspiration details (nectar straw) (None)  Pharyngeal - Nectar Syringe (None)  Penetration/Aspiration details (nectar syringe) (  None)  Pharyngeal - Ice Chips (None)  Penetration/Aspiration details (ice chips) (None)  Pharyngeal - Thin Teaspoon (None)  Penetration/Aspiration details (thin teaspoon) (None)  Pharyngeal - Thin Cup Delayed swallow initiation  Penetration/Aspiration details (thin cup) (None)  Pharyngeal - Thin Straw Delayed swallow initiation  Penetration/Aspiration details (thin straw) (None)  Pharyngeal - Thin Syringe (None)  Penetration/Aspiration details (thin syringe') (None)  Pharyngeal - Puree Delayed swallow initiation  Penetration/Aspiration details (puree) (None)  Pharyngeal - Mechanical Soft (None)  Penetration/Aspiration details (mechanical soft) (None)  Pharyngeal - Regular Delayed swallow initiation  Penetration/Aspiration details (regular) (None)  Pharyngeal - Multi-consistency (None)  Penetration/Aspiration details (multi-consistency) (None)  Pharyngeal - Pill Delayed swallow initiation  Penetration/Aspiration details (pill) (None)  Pharyngeal Comment (None)     No flowsheet data found.  No flowsheet data found.  Rodena Goldmann, SLP student Supervised and reviewed by Herbie Baltimore MA CCC-SLP        DeBlois, Katherene Ponto 08/21/2014, 12:04 PM       Microbiology: Recent Results (from the past 240 hour(s))  Culture, blood (routine x 2)     Status: None   Collection Time: 08/20/14  9:02 AM  Result Value Ref Range Status   Specimen Description BLOOD RIGHT HAND  Final   Special Requests BOTTLES DRAWN AEROBIC AND ANAEROBIC 4CC  Final   Culture   Final    NO GROWTH 5 DAYS Performed at [facility]    Report Status 08/26/2014 FINAL  Final  Culture, blood (routine x 2)     Status: None   Collection Time: 08/20/14  9:07 AM  Result  Value Ref Range Status   Specimen Description BLOOD LEFT ANTECUBITAL  Final   Special Requests BOTTLES DRAWN AEROBIC AND ANAEROBIC 4CC  Final   Culture   Final    NO GROWTH 5 DAYS Performed at [facility]    Report Status 08/26/2014 FINAL  Final     Labs: Results for orders placed or performed during the hospital encounter of 08/15/14 (from the past 48 hour(s))  Amylase     Status: None   Collection Time: 08/24/14  4:35 PM  Result Value Ref Range   Amylase 28 0 - 105 U/L  Lipase, blood     Status: None   Collection Time: 08/24/14  4:35 PM  Result Value Ref Range   Lipase 38 11 - 59 U/L  CBC     Status: Abnormal   Collection Time: 08/25/14  6:17 AM  Result Value Ref Range   WBC 12.4 (H) 4.0 - 10.5 K/uL   RBC 3.85 (L) 4.22 - 5.81 MIL/uL   Hemoglobin 11.7 (L) 13.0 - 17.0 g/dL   HCT 35.3 (L) 39.0 - 52.0 %   MCV 91.7 78.0 - 100.0 fL   MCH 30.4 26.0 - 34.0 pg   MCHC 33.1 30.0 - 36.0 g/dL   RDW 14.5 11.5 - 15.5 %   Platelets 305 150 - 400 K/uL  Comprehensive metabolic panel     Status: Abnormal   Collection Time: 08/25/14  6:17 AM  Result Value Ref Range   Sodium 134 (L) 135 - 145 mmol/L   Potassium 3.9 3.5 - 5.1 mmol/L   Chloride 101 96 - 112 mmol/L   CO2 28 19 - 32 mmol/L   Glucose, Bld 173 (H) 70 - 99 mg/dL   BUN 15 6 - 23 mg/dL   Creatinine, Ser 0.82 0.50 - 1.35 mg/dL   Calcium 8.1 (L) 8.4 - 10.5 mg/dL  Total Protein 5.9 (L) 6.0 - 8.3 g/dL   Albumin 2.3 (L) 3.5 - 5.2 g/dL   AST 36 0 - 37 U/L   ALT 5 0 - 53 U/L   Alkaline Phosphatase 139 (H) 39 - 117 U/L   Total Bilirubin 0.7 0.3 - 1.2 mg/dL   GFR calc non Af Amer 77 (L) >90 mL/min   GFR calc Af Amer 89 (L) >90 mL/min    Comment: (NOTE) The eGFR has been calculated using the CKD EPI equation. This calculation has not been validated in all clinical situations. eGFR's persistently <90 mL/min signify possible Chronic Kidney Disease.    Anion gap 5 5 - 15  Lipase, blood     Status: None   Collection Time:  08/25/14  6:17 AM  Result Value Ref Range   Lipase 41 11 - 59 U/L     HPI :Appreciate ID. Mortimer Bair is an 81 year old male with Parkinson's disease/dementia multiple drug allergies, among other medical problems, who came in with generalized weakness associated with shortness of breath and was found to be septic with white count of 19,000 despite outpatient antibiotics for close to a week(?Augmentin) given for UTI. Chest x-ray was unrevealing but this could be because patient was dry at admission. There is element of aspiration suggested by swallow evaluation done on 08/18/2014 SLP. Patient was hospitalized in November 2015 and at that time blood cultures grew ENTEROBACTER AMNIGENUS. Urine culture/blood cultures so far unrevealing. [place] [address] on Invanz. CT chest abdomen and pelvis showed "Bilateral lower lobe atelectasis versus infiltrates. Mild acute pancreatitis. 2.1 cm indeterminate low-attenuation lesion at the pancreatic neck has decreased in size from 2.8 cm previously. Differential diagnosis includes small pseudocyst, peripancreatic lymphadenopathy, or pancreatic neoplasm. Consider continued followup by CT versus abdomen MRI without and with contrast if patient can cooperate with breath holding". I spoke with [name] regarding EUS results-benign. Patient will need short-term rehabilitation placement. He should be on dysphagia diet per speech therapy recommendations. He feels better today. He responds to questions appropriately but remains lethargic. His white count has improved to 13.1   He has continued to have some mild discomfort and there was some inflammation of the pancreas on CT.   He has been treated with Invanz 7 days as recommended by infectious disease, despite the fact that his cultures have had no growth. Infectious disease, Dr. Johnnye Sima was concerned about intra-abdominal process and infection in the pancreas.  They feel that the abdomen may be the source of  fever since it does not appear that this is coming from his lungs. His CA 19 9 was elevated but slowly come down. The patient's wife notes that he is not really been eating and is not had a lot of abdominal pain but has complained of some mild abdominal pain.   HOSPITAL COURSE:  Sepsis/Aspiration pneumonia? Acute on chronic pancreatitis Pancreatic malignancy not ruled out /Atelectasis/UTI (outpatient culture showed Citrobacter)?/Leukocytosis improving  Culture no growth so far, completed 7 days of ertapenem for suspected aspiration pneumonia, due to elevated CA-19-9, repeat CT scan of the abdomen and pelvis showed progression of changes of acute pancreatitis with increasing peripancreatic edema, cannot exclude pancreatic necrosis, now developing pseudocyst. , cannot get an MRI because he has a stimulator back  Abdomen:could be the source of his fever per infectious disease Discussed with infectious disease Discussed with [name], gastroenterology. He strongly recommended no further invasive testing. He suggested that pancreatitis could be playing a role in  the patient's fever and his overall decline. We could consider making the patient nothing by mouth and start nasojejunal feeding, however the patient's family declined. According to him his overall prognosis is poor. Subsequently palliative care consultation was obtained and patient was found to be eligible for hospice Because of no beds available, patient will probably be discharged to SNF Patient started on Roxanol and lorazepam for comfort   Recurrent aspiration pneumonia  Continue Bronchodilators/oxygen supplementation as needed/incentive spirometry  Continue dysphagia 3 diet with nectar thick liquids  Repeat chest x-ray 2/17 obtained-stable by basilar changes, no new focal abnormality  Blood culture, urine culture remain negative since admission     Acute encephalopathy/Weakness/Dementia without behavioral  disturbance/Parkinsonism  Continue Sinemet, Cymbalta, Lyrica, rivastigmine  Physical therapy recommends SNF,  [facility] a bed is available   Reviewed speech therapy recommendations-dysphagia 3 diet with nectar thick liquids   Mild acute pancreatitis/pancreatic lesion  No acute changes   Essential hypertension  Norvasc discontinued per family request, blood pressure stable   Discharge Exam: Blood pressure 140/65, pulse 68, temperature 98.5 F (36.9 C), temperature source Oral, resp. rate 20, height $RemoveBe'5\' 1"'OJeNIBWMf$  (1.549 m), weight 82.07 kg (180 lb 14.9 oz), SpO2 98 %. General: Awake and answering questions Skin: No rash. IV site normal Lungs: Clear. He has a loose cough Cor: Regular S1 and S2 with no murmur Abdomen: Soft and diffusely tender. No diarrhea reported by nurse    Discharge Instructions    Diet - low sodium heart healthy    Complete by:  As directed      Increase activity slowly    Complete by:  As directed            Follow-up Information    Follow up with [name], MD. Schedule an appointment as soon as possible for a visit in 1 week.   Specialty:  Internal Medicine   Contact information:   [address] [place] [address] [phone]       Signed: Reyne Dumas 08/26/2014, 11:28 AM

## 2014-08-26 NOTE — Progress Notes (Signed)
Report was called to [facility]. All questions by family answered.Transportation provided with discharge papers. Family present. Pt escorted out via stretcher by EMS.

## 2014-09-02 DIAGNOSIS — A419 Sepsis, unspecified organism: Secondary | ICD-10-CM | POA: Diagnosis not present

## 2014-09-02 DIAGNOSIS — F339 Major depressive disorder, recurrent, unspecified: Secondary | ICD-10-CM | POA: Diagnosis not present

## 2014-09-02 DIAGNOSIS — I10 Essential (primary) hypertension: Secondary | ICD-10-CM | POA: Diagnosis not present

## 2014-09-02 DIAGNOSIS — G20 Parkinson's disease: Secondary | ICD-10-CM | POA: Diagnosis not present

## 2014-09-02 DIAGNOSIS — K859 Acute pancreatitis, unspecified: Secondary | ICD-10-CM | POA: Diagnosis not present

## 2014-09-03 DIAGNOSIS — F339 Major depressive disorder, recurrent, unspecified: Secondary | ICD-10-CM | POA: Diagnosis not present

## 2014-09-03 DIAGNOSIS — K859 Acute pancreatitis, unspecified: Secondary | ICD-10-CM | POA: Diagnosis not present

## 2014-09-03 DIAGNOSIS — A419 Sepsis, unspecified organism: Secondary | ICD-10-CM | POA: Diagnosis not present

## 2014-09-03 DIAGNOSIS — I10 Essential (primary) hypertension: Secondary | ICD-10-CM | POA: Diagnosis not present

## 2014-09-03 DIAGNOSIS — G20 Parkinson's disease: Secondary | ICD-10-CM | POA: Diagnosis not present

## 2014-09-04 DIAGNOSIS — I10 Essential (primary) hypertension: Secondary | ICD-10-CM | POA: Diagnosis not present

## 2014-09-04 DIAGNOSIS — K859 Acute pancreatitis, unspecified: Secondary | ICD-10-CM | POA: Diagnosis not present

## 2014-09-04 DIAGNOSIS — G20 Parkinson's disease: Secondary | ICD-10-CM | POA: Diagnosis not present

## 2014-09-04 DIAGNOSIS — F339 Major depressive disorder, recurrent, unspecified: Secondary | ICD-10-CM | POA: Diagnosis not present

## 2014-09-04 DIAGNOSIS — A419 Sepsis, unspecified organism: Secondary | ICD-10-CM | POA: Diagnosis not present

## 2014-09-05 DIAGNOSIS — F339 Major depressive disorder, recurrent, unspecified: Secondary | ICD-10-CM | POA: Diagnosis not present

## 2014-09-05 DIAGNOSIS — I10 Essential (primary) hypertension: Secondary | ICD-10-CM | POA: Diagnosis not present

## 2014-09-05 DIAGNOSIS — K859 Acute pancreatitis, unspecified: Secondary | ICD-10-CM | POA: Diagnosis not present

## 2014-09-05 DIAGNOSIS — A419 Sepsis, unspecified organism: Secondary | ICD-10-CM | POA: Diagnosis not present

## 2014-09-05 DIAGNOSIS — G20 Parkinson's disease: Secondary | ICD-10-CM | POA: Diagnosis not present

## 2014-09-06 DIAGNOSIS — I10 Essential (primary) hypertension: Secondary | ICD-10-CM | POA: Diagnosis not present

## 2014-09-06 DIAGNOSIS — G20 Parkinson's disease: Secondary | ICD-10-CM | POA: Diagnosis not present

## 2014-09-06 DIAGNOSIS — A419 Sepsis, unspecified organism: Secondary | ICD-10-CM | POA: Diagnosis not present

## 2014-09-06 DIAGNOSIS — K859 Acute pancreatitis, unspecified: Secondary | ICD-10-CM | POA: Diagnosis not present

## 2014-09-06 DIAGNOSIS — F339 Major depressive disorder, recurrent, unspecified: Secondary | ICD-10-CM | POA: Diagnosis not present

## 2014-09-07 DIAGNOSIS — I10 Essential (primary) hypertension: Secondary | ICD-10-CM | POA: Diagnosis not present

## 2014-09-07 DIAGNOSIS — A419 Sepsis, unspecified organism: Secondary | ICD-10-CM | POA: Diagnosis not present

## 2014-09-07 DIAGNOSIS — F339 Major depressive disorder, recurrent, unspecified: Secondary | ICD-10-CM | POA: Diagnosis not present

## 2014-09-07 DIAGNOSIS — G20 Parkinson's disease: Secondary | ICD-10-CM | POA: Diagnosis not present

## 2014-09-07 DIAGNOSIS — K859 Acute pancreatitis, unspecified: Secondary | ICD-10-CM | POA: Diagnosis not present

## 2014-09-08 DIAGNOSIS — A419 Sepsis, unspecified organism: Secondary | ICD-10-CM | POA: Diagnosis not present

## 2014-09-08 DIAGNOSIS — I10 Essential (primary) hypertension: Secondary | ICD-10-CM | POA: Diagnosis not present

## 2014-09-08 DIAGNOSIS — K859 Acute pancreatitis, unspecified: Secondary | ICD-10-CM | POA: Diagnosis not present

## 2014-09-08 DIAGNOSIS — F339 Major depressive disorder, recurrent, unspecified: Secondary | ICD-10-CM | POA: Diagnosis not present

## 2014-09-08 DIAGNOSIS — G20 Parkinson's disease: Secondary | ICD-10-CM | POA: Diagnosis not present

## 2014-09-09 DIAGNOSIS — K859 Acute pancreatitis, unspecified: Secondary | ICD-10-CM | POA: Diagnosis not present

## 2014-09-09 DIAGNOSIS — I10 Essential (primary) hypertension: Secondary | ICD-10-CM | POA: Diagnosis not present

## 2014-09-09 DIAGNOSIS — F339 Major depressive disorder, recurrent, unspecified: Secondary | ICD-10-CM | POA: Diagnosis not present

## 2014-09-09 DIAGNOSIS — G20 Parkinson's disease: Secondary | ICD-10-CM | POA: Diagnosis not present

## 2014-09-09 DIAGNOSIS — A419 Sepsis, unspecified organism: Secondary | ICD-10-CM | POA: Diagnosis not present

## 2014-09-10 DIAGNOSIS — G608 Other hereditary and idiopathic neuropathies: Secondary | ICD-10-CM | POA: Diagnosis not present

## 2014-09-10 DIAGNOSIS — G8929 Other chronic pain: Secondary | ICD-10-CM | POA: Diagnosis not present

## 2014-09-10 DIAGNOSIS — F339 Major depressive disorder, recurrent, unspecified: Secondary | ICD-10-CM | POA: Diagnosis not present

## 2014-09-10 DIAGNOSIS — R451 Restlessness and agitation: Secondary | ICD-10-CM | POA: Diagnosis not present

## 2014-09-10 DIAGNOSIS — K859 Acute pancreatitis, unspecified: Secondary | ICD-10-CM | POA: Diagnosis not present

## 2014-09-10 DIAGNOSIS — F0390 Unspecified dementia without behavioral disturbance: Secondary | ICD-10-CM | POA: Diagnosis not present

## 2014-09-10 DIAGNOSIS — G20 Parkinson's disease: Secondary | ICD-10-CM | POA: Diagnosis not present

## 2014-09-10 DIAGNOSIS — A419 Sepsis, unspecified organism: Secondary | ICD-10-CM | POA: Diagnosis not present

## 2014-09-10 DIAGNOSIS — F419 Anxiety disorder, unspecified: Secondary | ICD-10-CM | POA: Diagnosis not present

## 2014-09-10 DIAGNOSIS — I10 Essential (primary) hypertension: Secondary | ICD-10-CM | POA: Diagnosis not present

## 2014-09-11 DIAGNOSIS — F0390 Unspecified dementia without behavioral disturbance: Secondary | ICD-10-CM | POA: Diagnosis not present

## 2014-09-11 DIAGNOSIS — G20 Parkinson's disease: Secondary | ICD-10-CM | POA: Diagnosis not present

## 2014-09-11 DIAGNOSIS — F322 Major depressive disorder, single episode, severe without psychotic features: Secondary | ICD-10-CM | POA: Diagnosis not present

## 2014-09-11 DIAGNOSIS — R262 Difficulty in walking, not elsewhere classified: Secondary | ICD-10-CM | POA: Diagnosis not present

## 2014-09-11 DIAGNOSIS — R279 Unspecified lack of coordination: Secondary | ICD-10-CM | POA: Diagnosis not present

## 2014-09-11 DIAGNOSIS — K859 Acute pancreatitis, unspecified: Secondary | ICD-10-CM | POA: Diagnosis not present

## 2014-09-11 DIAGNOSIS — G319 Degenerative disease of nervous system, unspecified: Secondary | ICD-10-CM | POA: Diagnosis not present

## 2014-09-11 DIAGNOSIS — M6281 Muscle weakness (generalized): Secondary | ICD-10-CM | POA: Diagnosis not present

## 2014-09-11 DIAGNOSIS — N390 Urinary tract infection, site not specified: Secondary | ICD-10-CM | POA: Diagnosis not present

## 2014-09-11 DIAGNOSIS — A419 Sepsis, unspecified organism: Secondary | ICD-10-CM | POA: Diagnosis not present

## 2014-09-11 DIAGNOSIS — E46 Unspecified protein-calorie malnutrition: Secondary | ICD-10-CM | POA: Diagnosis not present

## 2014-09-11 DIAGNOSIS — R1909 Other intra-abdominal and pelvic swelling, mass and lump: Secondary | ICD-10-CM | POA: Diagnosis not present

## 2014-09-11 DIAGNOSIS — I10 Essential (primary) hypertension: Secondary | ICD-10-CM | POA: Diagnosis not present

## 2014-09-11 DIAGNOSIS — F339 Major depressive disorder, recurrent, unspecified: Secondary | ICD-10-CM | POA: Diagnosis not present

## 2014-09-11 DIAGNOSIS — R627 Adult failure to thrive: Secondary | ICD-10-CM | POA: Diagnosis not present

## 2014-09-11 DIAGNOSIS — R1313 Dysphagia, pharyngeal phase: Secondary | ICD-10-CM | POA: Diagnosis not present

## 2014-09-11 DIAGNOSIS — F0391 Unspecified dementia with behavioral disturbance: Secondary | ICD-10-CM | POA: Diagnosis not present

## 2014-09-12 DIAGNOSIS — G319 Degenerative disease of nervous system, unspecified: Secondary | ICD-10-CM | POA: Diagnosis not present

## 2014-09-12 DIAGNOSIS — A419 Sepsis, unspecified organism: Secondary | ICD-10-CM | POA: Diagnosis not present

## 2014-09-12 DIAGNOSIS — I10 Essential (primary) hypertension: Secondary | ICD-10-CM | POA: Diagnosis not present

## 2014-09-12 DIAGNOSIS — K859 Acute pancreatitis, unspecified: Secondary | ICD-10-CM | POA: Diagnosis not present

## 2014-09-12 DIAGNOSIS — F322 Major depressive disorder, single episode, severe without psychotic features: Secondary | ICD-10-CM | POA: Diagnosis not present

## 2014-09-12 DIAGNOSIS — E46 Unspecified protein-calorie malnutrition: Secondary | ICD-10-CM | POA: Diagnosis not present

## 2014-09-18 DIAGNOSIS — G20 Parkinson's disease: Secondary | ICD-10-CM | POA: Diagnosis not present

## 2014-09-18 DIAGNOSIS — K859 Acute pancreatitis, unspecified: Secondary | ICD-10-CM | POA: Diagnosis not present

## 2014-09-18 DIAGNOSIS — R1909 Other intra-abdominal and pelvic swelling, mass and lump: Secondary | ICD-10-CM | POA: Diagnosis not present

## 2014-09-18 DIAGNOSIS — F0390 Unspecified dementia without behavioral disturbance: Secondary | ICD-10-CM | POA: Diagnosis not present

## 2014-10-03 DIAGNOSIS — F339 Major depressive disorder, recurrent, unspecified: Secondary | ICD-10-CM | POA: Diagnosis not present

## 2014-10-03 DIAGNOSIS — R279 Unspecified lack of coordination: Secondary | ICD-10-CM | POA: Diagnosis not present

## 2014-10-03 DIAGNOSIS — G20 Parkinson's disease: Secondary | ICD-10-CM | POA: Diagnosis not present

## 2014-10-03 DIAGNOSIS — R627 Adult failure to thrive: Secondary | ICD-10-CM | POA: Diagnosis not present

## 2014-10-03 DIAGNOSIS — A419 Sepsis, unspecified organism: Secondary | ICD-10-CM | POA: Diagnosis not present

## 2014-10-03 DIAGNOSIS — F0391 Unspecified dementia with behavioral disturbance: Secondary | ICD-10-CM | POA: Diagnosis not present

## 2014-10-03 DIAGNOSIS — R1909 Other intra-abdominal and pelvic swelling, mass and lump: Secondary | ICD-10-CM | POA: Diagnosis not present

## 2014-10-03 DIAGNOSIS — R262 Difficulty in walking, not elsewhere classified: Secondary | ICD-10-CM | POA: Diagnosis not present

## 2014-10-03 DIAGNOSIS — N390 Urinary tract infection, site not specified: Secondary | ICD-10-CM | POA: Diagnosis not present

## 2014-10-03 DIAGNOSIS — K859 Acute pancreatitis, unspecified: Secondary | ICD-10-CM | POA: Diagnosis not present

## 2014-10-03 DIAGNOSIS — I10 Essential (primary) hypertension: Secondary | ICD-10-CM | POA: Diagnosis not present

## 2014-10-03 DIAGNOSIS — M6281 Muscle weakness (generalized): Secondary | ICD-10-CM | POA: Diagnosis not present

## 2014-10-10 DIAGNOSIS — K859 Acute pancreatitis, unspecified: Secondary | ICD-10-CM | POA: Diagnosis not present

## 2014-10-10 DIAGNOSIS — G20 Parkinson's disease: Secondary | ICD-10-CM | POA: Diagnosis not present

## 2014-10-10 DIAGNOSIS — A419 Sepsis, unspecified organism: Secondary | ICD-10-CM | POA: Diagnosis not present

## 2014-10-10 DIAGNOSIS — R627 Adult failure to thrive: Secondary | ICD-10-CM | POA: Diagnosis not present

## 2014-10-16 DIAGNOSIS — G20 Parkinson's disease: Secondary | ICD-10-CM | POA: Diagnosis not present

## 2014-10-16 DIAGNOSIS — R627 Adult failure to thrive: Secondary | ICD-10-CM | POA: Diagnosis not present

## 2014-10-16 DIAGNOSIS — R1909 Other intra-abdominal and pelvic swelling, mass and lump: Secondary | ICD-10-CM | POA: Diagnosis not present

## 2014-10-16 DIAGNOSIS — F0391 Unspecified dementia with behavioral disturbance: Secondary | ICD-10-CM | POA: Diagnosis not present

## 2014-10-24 DIAGNOSIS — G608 Other hereditary and idiopathic neuropathies: Secondary | ICD-10-CM | POA: Diagnosis not present

## 2014-10-24 DIAGNOSIS — G20 Parkinson's disease: Secondary | ICD-10-CM | POA: Diagnosis not present

## 2014-10-24 DIAGNOSIS — E43 Unspecified severe protein-calorie malnutrition: Secondary | ICD-10-CM | POA: Diagnosis not present

## 2014-10-24 DIAGNOSIS — F339 Major depressive disorder, recurrent, unspecified: Secondary | ICD-10-CM | POA: Diagnosis not present

## 2014-10-24 DIAGNOSIS — I10 Essential (primary) hypertension: Secondary | ICD-10-CM | POA: Diagnosis not present

## 2014-10-24 DIAGNOSIS — M6281 Muscle weakness (generalized): Secondary | ICD-10-CM | POA: Diagnosis not present

## 2014-10-29 DIAGNOSIS — M6281 Muscle weakness (generalized): Secondary | ICD-10-CM | POA: Diagnosis not present

## 2014-10-29 DIAGNOSIS — I10 Essential (primary) hypertension: Secondary | ICD-10-CM | POA: Diagnosis not present

## 2014-10-29 DIAGNOSIS — G608 Other hereditary and idiopathic neuropathies: Secondary | ICD-10-CM | POA: Diagnosis not present

## 2014-10-29 DIAGNOSIS — E43 Unspecified severe protein-calorie malnutrition: Secondary | ICD-10-CM | POA: Diagnosis not present

## 2014-10-29 DIAGNOSIS — F339 Major depressive disorder, recurrent, unspecified: Secondary | ICD-10-CM | POA: Diagnosis not present

## 2014-10-29 DIAGNOSIS — G20 Parkinson's disease: Secondary | ICD-10-CM | POA: Diagnosis not present

## 2014-10-31 DIAGNOSIS — I10 Essential (primary) hypertension: Secondary | ICD-10-CM | POA: Diagnosis not present

## 2014-10-31 DIAGNOSIS — F339 Major depressive disorder, recurrent, unspecified: Secondary | ICD-10-CM | POA: Diagnosis not present

## 2014-10-31 DIAGNOSIS — G20 Parkinson's disease: Secondary | ICD-10-CM | POA: Diagnosis not present

## 2014-10-31 DIAGNOSIS — M6281 Muscle weakness (generalized): Secondary | ICD-10-CM | POA: Diagnosis not present

## 2014-10-31 DIAGNOSIS — E43 Unspecified severe protein-calorie malnutrition: Secondary | ICD-10-CM | POA: Diagnosis not present

## 2014-10-31 DIAGNOSIS — G608 Other hereditary and idiopathic neuropathies: Secondary | ICD-10-CM | POA: Diagnosis not present

## 2014-11-03 DIAGNOSIS — G20 Parkinson's disease: Secondary | ICD-10-CM | POA: Diagnosis not present

## 2014-11-03 DIAGNOSIS — E43 Unspecified severe protein-calorie malnutrition: Secondary | ICD-10-CM | POA: Diagnosis not present

## 2014-11-03 DIAGNOSIS — M6281 Muscle weakness (generalized): Secondary | ICD-10-CM | POA: Diagnosis not present

## 2014-11-03 DIAGNOSIS — G608 Other hereditary and idiopathic neuropathies: Secondary | ICD-10-CM | POA: Diagnosis not present

## 2014-11-03 DIAGNOSIS — F339 Major depressive disorder, recurrent, unspecified: Secondary | ICD-10-CM | POA: Diagnosis not present

## 2014-11-03 DIAGNOSIS — I10 Essential (primary) hypertension: Secondary | ICD-10-CM | POA: Diagnosis not present

## 2014-11-05 DIAGNOSIS — G20 Parkinson's disease: Secondary | ICD-10-CM | POA: Diagnosis not present

## 2014-11-05 DIAGNOSIS — M6281 Muscle weakness (generalized): Secondary | ICD-10-CM | POA: Diagnosis not present

## 2014-11-05 DIAGNOSIS — I10 Essential (primary) hypertension: Secondary | ICD-10-CM | POA: Diagnosis not present

## 2014-11-05 DIAGNOSIS — G608 Other hereditary and idiopathic neuropathies: Secondary | ICD-10-CM | POA: Diagnosis not present

## 2014-11-05 DIAGNOSIS — E43 Unspecified severe protein-calorie malnutrition: Secondary | ICD-10-CM | POA: Diagnosis not present

## 2014-11-05 DIAGNOSIS — F339 Major depressive disorder, recurrent, unspecified: Secondary | ICD-10-CM | POA: Diagnosis not present

## 2014-11-06 DIAGNOSIS — G608 Other hereditary and idiopathic neuropathies: Secondary | ICD-10-CM | POA: Diagnosis not present

## 2014-11-06 DIAGNOSIS — I10 Essential (primary) hypertension: Secondary | ICD-10-CM | POA: Diagnosis not present

## 2014-11-06 DIAGNOSIS — F339 Major depressive disorder, recurrent, unspecified: Secondary | ICD-10-CM | POA: Diagnosis not present

## 2014-11-06 DIAGNOSIS — M6281 Muscle weakness (generalized): Secondary | ICD-10-CM | POA: Diagnosis not present

## 2014-11-06 DIAGNOSIS — E43 Unspecified severe protein-calorie malnutrition: Secondary | ICD-10-CM | POA: Diagnosis not present

## 2014-11-06 DIAGNOSIS — G20 Parkinson's disease: Secondary | ICD-10-CM | POA: Diagnosis not present

## 2014-11-11 DIAGNOSIS — F339 Major depressive disorder, recurrent, unspecified: Secondary | ICD-10-CM | POA: Diagnosis not present

## 2014-11-11 DIAGNOSIS — I10 Essential (primary) hypertension: Secondary | ICD-10-CM | POA: Diagnosis not present

## 2014-11-11 DIAGNOSIS — G20 Parkinson's disease: Secondary | ICD-10-CM | POA: Diagnosis not present

## 2014-11-11 DIAGNOSIS — G608 Other hereditary and idiopathic neuropathies: Secondary | ICD-10-CM | POA: Diagnosis not present

## 2014-11-11 DIAGNOSIS — M6281 Muscle weakness (generalized): Secondary | ICD-10-CM | POA: Diagnosis not present

## 2014-11-11 DIAGNOSIS — E43 Unspecified severe protein-calorie malnutrition: Secondary | ICD-10-CM | POA: Diagnosis not present

## 2014-11-14 DIAGNOSIS — G608 Other hereditary and idiopathic neuropathies: Secondary | ICD-10-CM | POA: Diagnosis not present

## 2014-11-14 DIAGNOSIS — E43 Unspecified severe protein-calorie malnutrition: Secondary | ICD-10-CM | POA: Diagnosis not present

## 2014-11-14 DIAGNOSIS — F339 Major depressive disorder, recurrent, unspecified: Secondary | ICD-10-CM | POA: Diagnosis not present

## 2014-11-14 DIAGNOSIS — G20 Parkinson's disease: Secondary | ICD-10-CM | POA: Diagnosis not present

## 2014-11-14 DIAGNOSIS — M6281 Muscle weakness (generalized): Secondary | ICD-10-CM | POA: Diagnosis not present

## 2014-11-14 DIAGNOSIS — I10 Essential (primary) hypertension: Secondary | ICD-10-CM | POA: Diagnosis not present

## 2014-11-17 DIAGNOSIS — F339 Major depressive disorder, recurrent, unspecified: Secondary | ICD-10-CM | POA: Diagnosis not present

## 2014-11-17 DIAGNOSIS — E43 Unspecified severe protein-calorie malnutrition: Secondary | ICD-10-CM | POA: Diagnosis not present

## 2014-11-17 DIAGNOSIS — G608 Other hereditary and idiopathic neuropathies: Secondary | ICD-10-CM | POA: Diagnosis not present

## 2014-11-17 DIAGNOSIS — M6281 Muscle weakness (generalized): Secondary | ICD-10-CM | POA: Diagnosis not present

## 2014-11-17 DIAGNOSIS — G20 Parkinson's disease: Secondary | ICD-10-CM | POA: Diagnosis not present

## 2014-11-17 DIAGNOSIS — I10 Essential (primary) hypertension: Secondary | ICD-10-CM | POA: Diagnosis not present

## 2014-11-24 DIAGNOSIS — G608 Other hereditary and idiopathic neuropathies: Secondary | ICD-10-CM | POA: Diagnosis not present

## 2014-11-24 DIAGNOSIS — E43 Unspecified severe protein-calorie malnutrition: Secondary | ICD-10-CM | POA: Diagnosis not present

## 2014-11-24 DIAGNOSIS — G20 Parkinson's disease: Secondary | ICD-10-CM | POA: Diagnosis not present

## 2014-11-24 DIAGNOSIS — M6281 Muscle weakness (generalized): Secondary | ICD-10-CM | POA: Diagnosis not present

## 2014-11-24 DIAGNOSIS — I10 Essential (primary) hypertension: Secondary | ICD-10-CM | POA: Diagnosis not present

## 2014-11-24 DIAGNOSIS — F339 Major depressive disorder, recurrent, unspecified: Secondary | ICD-10-CM | POA: Diagnosis not present

## 2014-11-25 DIAGNOSIS — R410 Disorientation, unspecified: Secondary | ICD-10-CM | POA: Diagnosis not present

## 2014-11-25 DIAGNOSIS — Z881 Allergy status to other antibiotic agents status: Secondary | ICD-10-CM | POA: Diagnosis not present

## 2014-11-25 DIAGNOSIS — G3185 Corticobasal degeneration: Secondary | ICD-10-CM | POA: Diagnosis not present

## 2014-11-25 DIAGNOSIS — Z9181 History of falling: Secondary | ICD-10-CM | POA: Diagnosis not present

## 2014-11-25 DIAGNOSIS — I10 Essential (primary) hypertension: Secondary | ICD-10-CM | POA: Diagnosis not present

## 2014-11-25 DIAGNOSIS — J690 Pneumonitis due to inhalation of food and vomit: Secondary | ICD-10-CM | POA: Diagnosis not present

## 2014-11-25 DIAGNOSIS — Z9049 Acquired absence of other specified parts of digestive tract: Secondary | ICD-10-CM | POA: Diagnosis present

## 2014-11-25 DIAGNOSIS — I6789 Other cerebrovascular disease: Secondary | ICD-10-CM | POA: Diagnosis not present

## 2014-11-25 DIAGNOSIS — J159 Unspecified bacterial pneumonia: Secondary | ICD-10-CM | POA: Diagnosis not present

## 2014-11-25 DIAGNOSIS — Z8719 Personal history of other diseases of the digestive system: Secondary | ICD-10-CM | POA: Diagnosis not present

## 2014-11-25 DIAGNOSIS — Z87440 Personal history of urinary (tract) infections: Secondary | ICD-10-CM | POA: Diagnosis not present

## 2014-11-25 DIAGNOSIS — R55 Syncope and collapse: Secondary | ICD-10-CM | POA: Diagnosis not present

## 2014-11-25 DIAGNOSIS — E785 Hyperlipidemia, unspecified: Secondary | ICD-10-CM | POA: Diagnosis present

## 2014-11-25 DIAGNOSIS — R509 Fever, unspecified: Secondary | ICD-10-CM | POA: Diagnosis not present

## 2014-11-25 DIAGNOSIS — Z66 Do not resuscitate: Secondary | ICD-10-CM | POA: Diagnosis present

## 2014-11-25 DIAGNOSIS — F4489 Other dissociative and conversion disorders: Secondary | ICD-10-CM | POA: Diagnosis not present

## 2014-11-25 DIAGNOSIS — R4182 Altered mental status, unspecified: Secondary | ICD-10-CM | POA: Diagnosis not present

## 2014-11-25 DIAGNOSIS — A419 Sepsis, unspecified organism: Secondary | ICD-10-CM | POA: Diagnosis not present

## 2014-11-25 DIAGNOSIS — R400 Somnolence: Secondary | ICD-10-CM | POA: Diagnosis not present

## 2014-11-25 DIAGNOSIS — D72829 Elevated white blood cell count, unspecified: Secondary | ICD-10-CM | POA: Diagnosis not present

## 2014-11-25 DIAGNOSIS — J189 Pneumonia, unspecified organism: Secondary | ICD-10-CM | POA: Diagnosis not present

## 2014-11-25 DIAGNOSIS — R1013 Epigastric pain: Secondary | ICD-10-CM | POA: Diagnosis present

## 2014-11-25 DIAGNOSIS — Z87891 Personal history of nicotine dependence: Secondary | ICD-10-CM | POA: Diagnosis not present

## 2014-11-25 DIAGNOSIS — F0280 Dementia in other diseases classified elsewhere without behavioral disturbance: Secondary | ICD-10-CM | POA: Diagnosis present

## 2014-11-25 DIAGNOSIS — R918 Other nonspecific abnormal finding of lung field: Secondary | ICD-10-CM | POA: Diagnosis not present

## 2014-11-25 DIAGNOSIS — G252 Other specified forms of tremor: Secondary | ICD-10-CM | POA: Diagnosis present

## 2014-11-25 DIAGNOSIS — G319 Degenerative disease of nervous system, unspecified: Secondary | ICD-10-CM | POA: Diagnosis not present

## 2014-11-25 DIAGNOSIS — R05 Cough: Secondary | ICD-10-CM | POA: Diagnosis not present

## 2014-11-25 DIAGNOSIS — Z85038 Personal history of other malignant neoplasm of large intestine: Secondary | ICD-10-CM | POA: Diagnosis not present

## 2014-11-30 DIAGNOSIS — G608 Other hereditary and idiopathic neuropathies: Secondary | ICD-10-CM | POA: Diagnosis not present

## 2014-11-30 DIAGNOSIS — E43 Unspecified severe protein-calorie malnutrition: Secondary | ICD-10-CM | POA: Diagnosis not present

## 2014-11-30 DIAGNOSIS — F339 Major depressive disorder, recurrent, unspecified: Secondary | ICD-10-CM | POA: Diagnosis not present

## 2014-11-30 DIAGNOSIS — M6281 Muscle weakness (generalized): Secondary | ICD-10-CM | POA: Diagnosis not present

## 2014-11-30 DIAGNOSIS — G20 Parkinson's disease: Secondary | ICD-10-CM | POA: Diagnosis not present

## 2014-11-30 DIAGNOSIS — I10 Essential (primary) hypertension: Secondary | ICD-10-CM | POA: Diagnosis not present

## 2014-12-03 DIAGNOSIS — G608 Other hereditary and idiopathic neuropathies: Secondary | ICD-10-CM | POA: Diagnosis not present

## 2014-12-03 DIAGNOSIS — E43 Unspecified severe protein-calorie malnutrition: Secondary | ICD-10-CM | POA: Diagnosis not present

## 2014-12-03 DIAGNOSIS — I10 Essential (primary) hypertension: Secondary | ICD-10-CM | POA: Diagnosis not present

## 2014-12-03 DIAGNOSIS — F339 Major depressive disorder, recurrent, unspecified: Secondary | ICD-10-CM | POA: Diagnosis not present

## 2014-12-03 DIAGNOSIS — M6281 Muscle weakness (generalized): Secondary | ICD-10-CM | POA: Diagnosis not present

## 2014-12-03 DIAGNOSIS — G20 Parkinson's disease: Secondary | ICD-10-CM | POA: Diagnosis not present

## 2014-12-05 DIAGNOSIS — F339 Major depressive disorder, recurrent, unspecified: Secondary | ICD-10-CM | POA: Diagnosis not present

## 2014-12-05 DIAGNOSIS — G608 Other hereditary and idiopathic neuropathies: Secondary | ICD-10-CM | POA: Diagnosis not present

## 2014-12-05 DIAGNOSIS — E43 Unspecified severe protein-calorie malnutrition: Secondary | ICD-10-CM | POA: Diagnosis not present

## 2014-12-05 DIAGNOSIS — M6281 Muscle weakness (generalized): Secondary | ICD-10-CM | POA: Diagnosis not present

## 2014-12-05 DIAGNOSIS — G20 Parkinson's disease: Secondary | ICD-10-CM | POA: Diagnosis not present

## 2014-12-05 DIAGNOSIS — I10 Essential (primary) hypertension: Secondary | ICD-10-CM | POA: Diagnosis not present

## 2014-12-09 DIAGNOSIS — G20 Parkinson's disease: Secondary | ICD-10-CM | POA: Diagnosis not present

## 2014-12-09 DIAGNOSIS — M6281 Muscle weakness (generalized): Secondary | ICD-10-CM | POA: Diagnosis not present

## 2014-12-09 DIAGNOSIS — E43 Unspecified severe protein-calorie malnutrition: Secondary | ICD-10-CM | POA: Diagnosis not present

## 2014-12-09 DIAGNOSIS — I10 Essential (primary) hypertension: Secondary | ICD-10-CM | POA: Diagnosis not present

## 2014-12-09 DIAGNOSIS — F339 Major depressive disorder, recurrent, unspecified: Secondary | ICD-10-CM | POA: Diagnosis not present

## 2014-12-09 DIAGNOSIS — G608 Other hereditary and idiopathic neuropathies: Secondary | ICD-10-CM | POA: Diagnosis not present

## 2014-12-12 DIAGNOSIS — G219 Secondary parkinsonism, unspecified: Secondary | ICD-10-CM | POA: Diagnosis not present

## 2014-12-12 DIAGNOSIS — G3185 Corticobasal degeneration: Secondary | ICD-10-CM | POA: Diagnosis not present

## 2014-12-18 DIAGNOSIS — Z961 Presence of intraocular lens: Secondary | ICD-10-CM | POA: Diagnosis not present

## 2014-12-18 DIAGNOSIS — H3531 Nonexudative age-related macular degeneration: Secondary | ICD-10-CM | POA: Diagnosis not present

## 2014-12-22 DIAGNOSIS — G20 Parkinson's disease: Secondary | ICD-10-CM | POA: Diagnosis not present

## 2014-12-22 DIAGNOSIS — E43 Unspecified severe protein-calorie malnutrition: Secondary | ICD-10-CM | POA: Diagnosis not present

## 2014-12-22 DIAGNOSIS — I10 Essential (primary) hypertension: Secondary | ICD-10-CM | POA: Diagnosis not present

## 2014-12-22 DIAGNOSIS — M6281 Muscle weakness (generalized): Secondary | ICD-10-CM | POA: Diagnosis not present

## 2014-12-22 DIAGNOSIS — G608 Other hereditary and idiopathic neuropathies: Secondary | ICD-10-CM | POA: Diagnosis not present

## 2014-12-22 DIAGNOSIS — F339 Major depressive disorder, recurrent, unspecified: Secondary | ICD-10-CM | POA: Diagnosis not present

## 2015-01-13 ENCOUNTER — Encounter: Payer: Self-pay | Admitting: *Deleted

## 2015-01-14 DIAGNOSIS — M25561 Pain in right knee: Secondary | ICD-10-CM | POA: Diagnosis not present

## 2015-02-18 ENCOUNTER — Encounter: Payer: Self-pay | Admitting: Cardiovascular Disease

## 2015-03-25 DIAGNOSIS — R7301 Impaired fasting glucose: Secondary | ICD-10-CM | POA: Diagnosis not present

## 2015-03-25 DIAGNOSIS — I509 Heart failure, unspecified: Secondary | ICD-10-CM | POA: Diagnosis not present

## 2015-03-25 DIAGNOSIS — E785 Hyperlipidemia, unspecified: Secondary | ICD-10-CM | POA: Diagnosis not present

## 2015-04-06 DIAGNOSIS — R5383 Other fatigue: Secondary | ICD-10-CM | POA: Diagnosis not present

## 2015-04-06 DIAGNOSIS — R531 Weakness: Secondary | ICD-10-CM | POA: Diagnosis not present

## 2015-04-06 DIAGNOSIS — R269 Unspecified abnormalities of gait and mobility: Secondary | ICD-10-CM | POA: Diagnosis not present

## 2015-04-27 DIAGNOSIS — R531 Weakness: Secondary | ICD-10-CM | POA: Diagnosis not present

## 2015-05-05 DIAGNOSIS — R531 Weakness: Secondary | ICD-10-CM | POA: Diagnosis not present

## 2015-05-07 DIAGNOSIS — R531 Weakness: Secondary | ICD-10-CM | POA: Diagnosis not present

## 2015-05-12 DIAGNOSIS — R531 Weakness: Secondary | ICD-10-CM | POA: Diagnosis not present

## 2015-05-13 DIAGNOSIS — M25561 Pain in right knee: Secondary | ICD-10-CM | POA: Diagnosis not present

## 2015-05-13 DIAGNOSIS — M25562 Pain in left knee: Secondary | ICD-10-CM | POA: Diagnosis not present

## 2015-05-13 NOTE — Patient Outreach (Signed)
[facility]) Care Management  05/13/2015  Paul Callahan 1923-11-23 [id number]   Referral from [employer] Tier 4, assigned Enzo Montgomery, RN to outreach for [name] [facility] services.  Thanks, Ronnell Freshwater. [place], [place] Assistant Phone: [phone] Fax: [phone]

## 2015-05-14 ENCOUNTER — Other Ambulatory Visit: Payer: Self-pay

## 2015-05-14 DIAGNOSIS — R531 Weakness: Secondary | ICD-10-CM | POA: Diagnosis not present

## 2015-05-14 NOTE — Patient Outreach (Signed)
[facility]) Care Management  05/14/2015  NUEL COOKSEY [birth year] [id number]   Notification from Enzo Montgomery, RN to close case due to patient refused [place] Management services.  Thanks, Ronnell Freshwater. [place], [place] Assistant Phone: [phone] Fax: [phone]

## 2015-05-14 NOTE — Patient Outreach (Signed)
[facility]) Care Management  05/14/2015  DORRELL ROTHSTEIN 01-27-1924 [id number]   Telephone Screen  Referral Date: 05/13/15 Referral Source: [employer] Tier 4 Referral Reason: CHF, 2 admits   Outreach attempt # 1 to patient. Spoke with spouse who was adamant and would not let RN CM speak with patient. She reports she handles his medical affairs. She verified DOB and address. Brief description of call and [facility] services given to spouse. No PHI disclosed to spouse. She immediately became upset in regards to last hospitalization at [facility] and did not want to discuss [facility] any further due to its affiliation. She reported that patient did not have any case management needs and concerns at this. Declined any further calls and hung up the phone.  Plan: RN CM will notify [facility] administrative assistant of refusal for services and case closed.  Enzo Montgomery, RN,BSN,CCM [place] Management Telephonic Care Management Coordinator Direct Phone: [phone] Toll Free: [phone] Fax: [phone]

## 2015-05-19 DIAGNOSIS — R531 Weakness: Secondary | ICD-10-CM | POA: Diagnosis not present

## 2015-05-21 DIAGNOSIS — R531 Weakness: Secondary | ICD-10-CM | POA: Diagnosis not present

## 2015-05-25 DIAGNOSIS — R531 Weakness: Secondary | ICD-10-CM | POA: Diagnosis not present

## 2015-06-01 DIAGNOSIS — M25561 Pain in right knee: Secondary | ICD-10-CM | POA: Diagnosis not present

## 2015-06-01 DIAGNOSIS — M25562 Pain in left knee: Secondary | ICD-10-CM | POA: Diagnosis not present

## 2015-06-02 DIAGNOSIS — R531 Weakness: Secondary | ICD-10-CM | POA: Diagnosis not present

## 2015-06-04 DIAGNOSIS — R531 Weakness: Secondary | ICD-10-CM | POA: Diagnosis not present

## 2015-06-08 DIAGNOSIS — R531 Weakness: Secondary | ICD-10-CM | POA: Diagnosis not present

## 2015-06-10 DIAGNOSIS — R531 Weakness: Secondary | ICD-10-CM | POA: Diagnosis not present

## 2015-07-08 DIAGNOSIS — D225 Melanocytic nevi of trunk: Secondary | ICD-10-CM | POA: Diagnosis not present

## 2015-07-08 DIAGNOSIS — L820 Inflamed seborrheic keratosis: Secondary | ICD-10-CM | POA: Diagnosis not present

## 2015-07-08 DIAGNOSIS — Z85828 Personal history of other malignant neoplasm of skin: Secondary | ICD-10-CM | POA: Diagnosis not present

## 2015-07-08 DIAGNOSIS — D1801 Hemangioma of skin and subcutaneous tissue: Secondary | ICD-10-CM | POA: Diagnosis not present

## 2015-07-09 DIAGNOSIS — F17211 Nicotine dependence, cigarettes, in remission: Secondary | ICD-10-CM | POA: Diagnosis not present

## 2015-07-09 DIAGNOSIS — G20 Parkinson's disease: Secondary | ICD-10-CM | POA: Diagnosis not present

## 2015-07-09 DIAGNOSIS — N183 Chronic kidney disease, stage 3 (moderate): Secondary | ICD-10-CM | POA: Diagnosis not present

## 2015-07-09 DIAGNOSIS — E785 Hyperlipidemia, unspecified: Secondary | ICD-10-CM | POA: Diagnosis not present

## 2015-07-09 DIAGNOSIS — F339 Major depressive disorder, recurrent, unspecified: Secondary | ICD-10-CM | POA: Diagnosis not present

## 2015-09-24 DIAGNOSIS — G219 Secondary parkinsonism, unspecified: Secondary | ICD-10-CM | POA: Diagnosis not present

## 2015-11-09 DIAGNOSIS — R531 Weakness: Secondary | ICD-10-CM | POA: Diagnosis not present

## 2015-11-16 DIAGNOSIS — R531 Weakness: Secondary | ICD-10-CM | POA: Diagnosis not present

## 2015-11-19 DIAGNOSIS — R531 Weakness: Secondary | ICD-10-CM | POA: Diagnosis not present

## 2015-11-23 DIAGNOSIS — R531 Weakness: Secondary | ICD-10-CM | POA: Diagnosis not present

## 2015-11-26 DIAGNOSIS — R531 Weakness: Secondary | ICD-10-CM | POA: Diagnosis not present

## 2015-12-03 DIAGNOSIS — R531 Weakness: Secondary | ICD-10-CM | POA: Diagnosis not present

## 2015-12-08 DIAGNOSIS — R531 Weakness: Secondary | ICD-10-CM | POA: Diagnosis not present

## 2015-12-10 DIAGNOSIS — R531 Weakness: Secondary | ICD-10-CM | POA: Diagnosis not present

## 2015-12-15 DIAGNOSIS — R531 Weakness: Secondary | ICD-10-CM | POA: Diagnosis not present

## 2015-12-17 DIAGNOSIS — R531 Weakness: Secondary | ICD-10-CM | POA: Diagnosis not present

## 2015-12-21 DIAGNOSIS — H01001 Unspecified blepharitis right upper eyelid: Secondary | ICD-10-CM | POA: Diagnosis not present

## 2015-12-21 DIAGNOSIS — H52203 Unspecified astigmatism, bilateral: Secondary | ICD-10-CM | POA: Diagnosis not present

## 2015-12-21 DIAGNOSIS — H04123 Dry eye syndrome of bilateral lacrimal glands: Secondary | ICD-10-CM | POA: Diagnosis not present

## 2015-12-21 DIAGNOSIS — H353132 Nonexudative age-related macular degeneration, bilateral, intermediate dry stage: Secondary | ICD-10-CM | POA: Diagnosis not present

## 2016-01-04 DIAGNOSIS — C44311 Basal cell carcinoma of skin of nose: Secondary | ICD-10-CM | POA: Diagnosis not present

## 2016-01-04 DIAGNOSIS — D485 Neoplasm of uncertain behavior of skin: Secondary | ICD-10-CM | POA: Diagnosis not present

## 2016-01-04 DIAGNOSIS — Z85828 Personal history of other malignant neoplasm of skin: Secondary | ICD-10-CM | POA: Diagnosis not present

## 2016-01-12 DIAGNOSIS — R8290 Unspecified abnormal findings in urine: Secondary | ICD-10-CM | POA: Diagnosis not present

## 2016-01-12 DIAGNOSIS — R05 Cough: Secondary | ICD-10-CM | POA: Diagnosis not present

## 2016-01-13 ENCOUNTER — Other Ambulatory Visit (HOSPITAL_COMMUNITY): Payer: Self-pay | Admitting: Internal Medicine

## 2016-01-13 DIAGNOSIS — R1310 Dysphagia, unspecified: Secondary | ICD-10-CM

## 2016-01-21 ENCOUNTER — Ambulatory Visit (HOSPITAL_COMMUNITY)
Admission: RE | Admit: 2016-01-21 | Discharge: 2016-01-21 | Disposition: A | Discharge status: Home / Self Care | Payer: Medicare Other | Source: Ambulatory Visit | Attending: Internal Medicine | Admitting: Internal Medicine

## 2016-01-21 DIAGNOSIS — R1312 Dysphagia, oropharyngeal phase: Secondary | ICD-10-CM | POA: Diagnosis not present

## 2016-01-21 DIAGNOSIS — F329 Major depressive disorder, single episode, unspecified: Secondary | ICD-10-CM | POA: Insufficient documentation

## 2016-01-21 DIAGNOSIS — R1310 Dysphagia, unspecified: Secondary | ICD-10-CM

## 2016-01-21 DIAGNOSIS — G20 Parkinson's disease: Secondary | ICD-10-CM | POA: Diagnosis not present

## 2016-01-21 DIAGNOSIS — T17300A Unspecified foreign body in larynx causing asphyxiation, initial encounter: Secondary | ICD-10-CM | POA: Diagnosis not present

## 2016-01-21 DIAGNOSIS — F0390 Unspecified dementia without behavioral disturbance: Secondary | ICD-10-CM | POA: Insufficient documentation

## 2016-01-28 DIAGNOSIS — Z85828 Personal history of other malignant neoplasm of skin: Secondary | ICD-10-CM | POA: Diagnosis not present

## 2016-01-28 DIAGNOSIS — C44311 Basal cell carcinoma of skin of nose: Secondary | ICD-10-CM | POA: Diagnosis not present

## 2016-02-11 DIAGNOSIS — M25562 Pain in left knee: Secondary | ICD-10-CM | POA: Diagnosis not present

## 2016-02-11 DIAGNOSIS — M25561 Pain in right knee: Secondary | ICD-10-CM | POA: Diagnosis not present

## 2016-03-04 ENCOUNTER — Other Ambulatory Visit: Payer: Self-pay

## 2016-03-17 NOTE — Progress Notes (Signed)
   01/21/16 1400  SLP G-Codes **NOT FOR INPATIENT CLASS**  Functional Assessment Tool Used clinical judgement  Functional Limitations Swallowing  Swallow Current Status [id number]) CJ  Swallow Goal Status [id number]) CJ  Swallow Discharge Status [id number]) CJ  SLP Evaluations  $ SLP Speech Visit 1 Procedure  SLP Evaluations  $MBS Swallow 1 Procedure  $Swallowing Treatment 1 Procedure

## 2016-03-17 NOTE — Addendum Note (Signed)
Encounter addended by: Jerene Pitch, CCC-SLP on: 03/17/2016  8:30 AM<BR>    Actions taken: Sign clinical note

## 2016-03-28 DIAGNOSIS — Z23 Encounter for immunization: Secondary | ICD-10-CM | POA: Diagnosis not present

## 2016-08-16 DIAGNOSIS — M549 Dorsalgia, unspecified: Secondary | ICD-10-CM | POA: Diagnosis not present

## 2016-08-16 DIAGNOSIS — G20 Parkinson's disease: Secondary | ICD-10-CM | POA: Diagnosis not present

## 2016-09-02 ENCOUNTER — Encounter: Payer: Self-pay | Admitting: Neurology

## 2016-09-02 DIAGNOSIS — R1310 Dysphagia, unspecified: Secondary | ICD-10-CM | POA: Diagnosis not present

## 2016-09-02 DIAGNOSIS — G20 Parkinson's disease: Secondary | ICD-10-CM | POA: Diagnosis not present

## 2016-09-02 DIAGNOSIS — M6281 Muscle weakness (generalized): Secondary | ICD-10-CM | POA: Diagnosis not present

## 2016-09-02 DIAGNOSIS — Z8701 Personal history of pneumonia (recurrent): Secondary | ICD-10-CM | POA: Diagnosis not present

## 2016-09-02 DIAGNOSIS — I10 Essential (primary) hypertension: Secondary | ICD-10-CM | POA: Diagnosis not present

## 2016-09-02 DIAGNOSIS — F329 Major depressive disorder, single episode, unspecified: Secondary | ICD-10-CM | POA: Diagnosis not present

## 2016-09-05 DIAGNOSIS — Z8701 Personal history of pneumonia (recurrent): Secondary | ICD-10-CM | POA: Diagnosis not present

## 2016-09-05 DIAGNOSIS — R1310 Dysphagia, unspecified: Secondary | ICD-10-CM | POA: Diagnosis not present

## 2016-09-05 DIAGNOSIS — F329 Major depressive disorder, single episode, unspecified: Secondary | ICD-10-CM | POA: Diagnosis not present

## 2016-09-05 DIAGNOSIS — M6281 Muscle weakness (generalized): Secondary | ICD-10-CM | POA: Diagnosis not present

## 2016-09-05 DIAGNOSIS — I10 Essential (primary) hypertension: Secondary | ICD-10-CM | POA: Diagnosis not present

## 2016-09-05 DIAGNOSIS — G20 Parkinson's disease: Secondary | ICD-10-CM | POA: Diagnosis not present

## 2016-09-07 DIAGNOSIS — R1310 Dysphagia, unspecified: Secondary | ICD-10-CM | POA: Diagnosis not present

## 2016-09-07 DIAGNOSIS — M6281 Muscle weakness (generalized): Secondary | ICD-10-CM | POA: Diagnosis not present

## 2016-09-07 DIAGNOSIS — F329 Major depressive disorder, single episode, unspecified: Secondary | ICD-10-CM | POA: Diagnosis not present

## 2016-09-07 DIAGNOSIS — G20 Parkinson's disease: Secondary | ICD-10-CM | POA: Diagnosis not present

## 2016-09-07 DIAGNOSIS — I10 Essential (primary) hypertension: Secondary | ICD-10-CM | POA: Diagnosis not present

## 2016-09-07 DIAGNOSIS — Z8701 Personal history of pneumonia (recurrent): Secondary | ICD-10-CM | POA: Diagnosis not present

## 2016-09-08 DIAGNOSIS — R1310 Dysphagia, unspecified: Secondary | ICD-10-CM | POA: Diagnosis not present

## 2016-09-08 DIAGNOSIS — F329 Major depressive disorder, single episode, unspecified: Secondary | ICD-10-CM | POA: Diagnosis not present

## 2016-09-08 DIAGNOSIS — Z8701 Personal history of pneumonia (recurrent): Secondary | ICD-10-CM | POA: Diagnosis not present

## 2016-09-08 DIAGNOSIS — M6281 Muscle weakness (generalized): Secondary | ICD-10-CM | POA: Diagnosis not present

## 2016-09-08 DIAGNOSIS — I10 Essential (primary) hypertension: Secondary | ICD-10-CM | POA: Diagnosis not present

## 2016-09-08 DIAGNOSIS — G20 Parkinson's disease: Secondary | ICD-10-CM | POA: Diagnosis not present

## 2016-09-09 DIAGNOSIS — Z8701 Personal history of pneumonia (recurrent): Secondary | ICD-10-CM | POA: Diagnosis not present

## 2016-09-09 DIAGNOSIS — G20 Parkinson's disease: Secondary | ICD-10-CM | POA: Diagnosis not present

## 2016-09-09 DIAGNOSIS — M6281 Muscle weakness (generalized): Secondary | ICD-10-CM | POA: Diagnosis not present

## 2016-09-09 DIAGNOSIS — F329 Major depressive disorder, single episode, unspecified: Secondary | ICD-10-CM | POA: Diagnosis not present

## 2016-09-09 DIAGNOSIS — R1310 Dysphagia, unspecified: Secondary | ICD-10-CM | POA: Diagnosis not present

## 2016-09-09 DIAGNOSIS — I10 Essential (primary) hypertension: Secondary | ICD-10-CM | POA: Diagnosis not present

## 2016-09-12 DIAGNOSIS — M6281 Muscle weakness (generalized): Secondary | ICD-10-CM | POA: Diagnosis not present

## 2016-09-12 DIAGNOSIS — I10 Essential (primary) hypertension: Secondary | ICD-10-CM | POA: Diagnosis not present

## 2016-09-12 DIAGNOSIS — Z8701 Personal history of pneumonia (recurrent): Secondary | ICD-10-CM | POA: Diagnosis not present

## 2016-09-12 DIAGNOSIS — R1310 Dysphagia, unspecified: Secondary | ICD-10-CM | POA: Diagnosis not present

## 2016-09-12 DIAGNOSIS — F329 Major depressive disorder, single episode, unspecified: Secondary | ICD-10-CM | POA: Diagnosis not present

## 2016-09-12 DIAGNOSIS — G20 Parkinson's disease: Secondary | ICD-10-CM | POA: Diagnosis not present

## 2016-09-13 DIAGNOSIS — G20 Parkinson's disease: Secondary | ICD-10-CM | POA: Diagnosis not present

## 2016-09-13 DIAGNOSIS — F329 Major depressive disorder, single episode, unspecified: Secondary | ICD-10-CM | POA: Diagnosis not present

## 2016-09-13 DIAGNOSIS — I10 Essential (primary) hypertension: Secondary | ICD-10-CM | POA: Diagnosis not present

## 2016-09-13 DIAGNOSIS — R1310 Dysphagia, unspecified: Secondary | ICD-10-CM | POA: Diagnosis not present

## 2016-09-13 DIAGNOSIS — M6281 Muscle weakness (generalized): Secondary | ICD-10-CM | POA: Diagnosis not present

## 2016-09-13 DIAGNOSIS — Z8701 Personal history of pneumonia (recurrent): Secondary | ICD-10-CM | POA: Diagnosis not present

## 2016-09-14 DIAGNOSIS — I10 Essential (primary) hypertension: Secondary | ICD-10-CM | POA: Diagnosis not present

## 2016-09-14 DIAGNOSIS — F329 Major depressive disorder, single episode, unspecified: Secondary | ICD-10-CM | POA: Diagnosis not present

## 2016-09-14 DIAGNOSIS — M6281 Muscle weakness (generalized): Secondary | ICD-10-CM | POA: Diagnosis not present

## 2016-09-14 DIAGNOSIS — G20 Parkinson's disease: Secondary | ICD-10-CM | POA: Diagnosis not present

## 2016-09-14 DIAGNOSIS — R1310 Dysphagia, unspecified: Secondary | ICD-10-CM | POA: Diagnosis not present

## 2016-09-14 DIAGNOSIS — Z8701 Personal history of pneumonia (recurrent): Secondary | ICD-10-CM | POA: Diagnosis not present

## 2016-09-15 DIAGNOSIS — J209 Acute bronchitis, unspecified: Secondary | ICD-10-CM | POA: Diagnosis not present

## 2016-09-15 DIAGNOSIS — F329 Major depressive disorder, single episode, unspecified: Secondary | ICD-10-CM | POA: Diagnosis not present

## 2016-09-15 DIAGNOSIS — Z8701 Personal history of pneumonia (recurrent): Secondary | ICD-10-CM | POA: Diagnosis not present

## 2016-09-15 DIAGNOSIS — R1310 Dysphagia, unspecified: Secondary | ICD-10-CM | POA: Diagnosis not present

## 2016-09-15 DIAGNOSIS — I10 Essential (primary) hypertension: Secondary | ICD-10-CM | POA: Diagnosis not present

## 2016-09-15 DIAGNOSIS — M6281 Muscle weakness (generalized): Secondary | ICD-10-CM | POA: Diagnosis not present

## 2016-09-15 DIAGNOSIS — J40 Bronchitis, not specified as acute or chronic: Secondary | ICD-10-CM | POA: Diagnosis not present

## 2016-09-15 DIAGNOSIS — G20 Parkinson's disease: Secondary | ICD-10-CM | POA: Diagnosis not present

## 2016-09-16 DIAGNOSIS — I10 Essential (primary) hypertension: Secondary | ICD-10-CM | POA: Diagnosis not present

## 2016-09-16 DIAGNOSIS — R1310 Dysphagia, unspecified: Secondary | ICD-10-CM | POA: Diagnosis not present

## 2016-09-16 DIAGNOSIS — F329 Major depressive disorder, single episode, unspecified: Secondary | ICD-10-CM | POA: Diagnosis not present

## 2016-09-16 DIAGNOSIS — G20 Parkinson's disease: Secondary | ICD-10-CM | POA: Diagnosis not present

## 2016-09-16 DIAGNOSIS — Z8701 Personal history of pneumonia (recurrent): Secondary | ICD-10-CM | POA: Diagnosis not present

## 2016-09-16 DIAGNOSIS — M6281 Muscle weakness (generalized): Secondary | ICD-10-CM | POA: Diagnosis not present

## 2016-09-19 DIAGNOSIS — G20 Parkinson's disease: Secondary | ICD-10-CM | POA: Diagnosis not present

## 2016-09-19 DIAGNOSIS — F329 Major depressive disorder, single episode, unspecified: Secondary | ICD-10-CM | POA: Diagnosis not present

## 2016-09-19 DIAGNOSIS — Z8701 Personal history of pneumonia (recurrent): Secondary | ICD-10-CM | POA: Diagnosis not present

## 2016-09-19 DIAGNOSIS — M6281 Muscle weakness (generalized): Secondary | ICD-10-CM | POA: Diagnosis not present

## 2016-09-19 DIAGNOSIS — I10 Essential (primary) hypertension: Secondary | ICD-10-CM | POA: Diagnosis not present

## 2016-09-19 DIAGNOSIS — R1310 Dysphagia, unspecified: Secondary | ICD-10-CM | POA: Diagnosis not present

## 2016-09-20 DIAGNOSIS — G20 Parkinson's disease: Secondary | ICD-10-CM | POA: Diagnosis not present

## 2016-09-20 DIAGNOSIS — Z8701 Personal history of pneumonia (recurrent): Secondary | ICD-10-CM | POA: Diagnosis not present

## 2016-09-20 DIAGNOSIS — I10 Essential (primary) hypertension: Secondary | ICD-10-CM | POA: Diagnosis not present

## 2016-09-20 DIAGNOSIS — R1310 Dysphagia, unspecified: Secondary | ICD-10-CM | POA: Diagnosis not present

## 2016-09-20 DIAGNOSIS — M6281 Muscle weakness (generalized): Secondary | ICD-10-CM | POA: Diagnosis not present

## 2016-09-20 DIAGNOSIS — F329 Major depressive disorder, single episode, unspecified: Secondary | ICD-10-CM | POA: Diagnosis not present

## 2016-09-22 DIAGNOSIS — F329 Major depressive disorder, single episode, unspecified: Secondary | ICD-10-CM | POA: Diagnosis not present

## 2016-09-22 DIAGNOSIS — R1310 Dysphagia, unspecified: Secondary | ICD-10-CM | POA: Diagnosis not present

## 2016-09-22 DIAGNOSIS — Z8701 Personal history of pneumonia (recurrent): Secondary | ICD-10-CM | POA: Diagnosis not present

## 2016-09-22 DIAGNOSIS — G20 Parkinson's disease: Secondary | ICD-10-CM | POA: Diagnosis not present

## 2016-09-22 DIAGNOSIS — M6281 Muscle weakness (generalized): Secondary | ICD-10-CM | POA: Diagnosis not present

## 2016-09-22 DIAGNOSIS — I10 Essential (primary) hypertension: Secondary | ICD-10-CM | POA: Diagnosis not present

## 2016-09-23 DIAGNOSIS — R1310 Dysphagia, unspecified: Secondary | ICD-10-CM | POA: Diagnosis not present

## 2016-09-23 DIAGNOSIS — Z8701 Personal history of pneumonia (recurrent): Secondary | ICD-10-CM | POA: Diagnosis not present

## 2016-09-23 DIAGNOSIS — G20 Parkinson's disease: Secondary | ICD-10-CM | POA: Diagnosis not present

## 2016-09-23 DIAGNOSIS — F329 Major depressive disorder, single episode, unspecified: Secondary | ICD-10-CM | POA: Diagnosis not present

## 2016-09-23 DIAGNOSIS — M6281 Muscle weakness (generalized): Secondary | ICD-10-CM | POA: Diagnosis not present

## 2016-09-23 DIAGNOSIS — I10 Essential (primary) hypertension: Secondary | ICD-10-CM | POA: Diagnosis not present

## 2016-09-26 DIAGNOSIS — F329 Major depressive disorder, single episode, unspecified: Secondary | ICD-10-CM | POA: Diagnosis not present

## 2016-09-26 DIAGNOSIS — I10 Essential (primary) hypertension: Secondary | ICD-10-CM | POA: Diagnosis not present

## 2016-09-26 DIAGNOSIS — G20 Parkinson's disease: Secondary | ICD-10-CM | POA: Diagnosis not present

## 2016-09-26 DIAGNOSIS — M6281 Muscle weakness (generalized): Secondary | ICD-10-CM | POA: Diagnosis not present

## 2016-09-26 DIAGNOSIS — Z8701 Personal history of pneumonia (recurrent): Secondary | ICD-10-CM | POA: Diagnosis not present

## 2016-09-26 DIAGNOSIS — R1310 Dysphagia, unspecified: Secondary | ICD-10-CM | POA: Diagnosis not present

## 2016-09-27 DIAGNOSIS — F329 Major depressive disorder, single episode, unspecified: Secondary | ICD-10-CM | POA: Diagnosis not present

## 2016-09-27 DIAGNOSIS — G20 Parkinson's disease: Secondary | ICD-10-CM | POA: Diagnosis not present

## 2016-09-27 DIAGNOSIS — M6281 Muscle weakness (generalized): Secondary | ICD-10-CM | POA: Diagnosis not present

## 2016-09-27 DIAGNOSIS — I10 Essential (primary) hypertension: Secondary | ICD-10-CM | POA: Diagnosis not present

## 2016-09-27 DIAGNOSIS — Z8701 Personal history of pneumonia (recurrent): Secondary | ICD-10-CM | POA: Diagnosis not present

## 2016-09-27 DIAGNOSIS — R1310 Dysphagia, unspecified: Secondary | ICD-10-CM | POA: Diagnosis not present

## 2016-09-28 DIAGNOSIS — R1310 Dysphagia, unspecified: Secondary | ICD-10-CM | POA: Diagnosis not present

## 2016-09-28 DIAGNOSIS — F329 Major depressive disorder, single episode, unspecified: Secondary | ICD-10-CM | POA: Diagnosis not present

## 2016-09-28 DIAGNOSIS — M6281 Muscle weakness (generalized): Secondary | ICD-10-CM | POA: Diagnosis not present

## 2016-09-28 DIAGNOSIS — G20 Parkinson's disease: Secondary | ICD-10-CM | POA: Diagnosis not present

## 2016-09-28 DIAGNOSIS — I10 Essential (primary) hypertension: Secondary | ICD-10-CM | POA: Diagnosis not present

## 2016-09-28 DIAGNOSIS — Z8701 Personal history of pneumonia (recurrent): Secondary | ICD-10-CM | POA: Diagnosis not present

## 2016-09-30 DIAGNOSIS — R1310 Dysphagia, unspecified: Secondary | ICD-10-CM | POA: Diagnosis not present

## 2016-09-30 DIAGNOSIS — M6281 Muscle weakness (generalized): Secondary | ICD-10-CM | POA: Diagnosis not present

## 2016-09-30 DIAGNOSIS — Z8701 Personal history of pneumonia (recurrent): Secondary | ICD-10-CM | POA: Diagnosis not present

## 2016-09-30 DIAGNOSIS — F329 Major depressive disorder, single episode, unspecified: Secondary | ICD-10-CM | POA: Diagnosis not present

## 2016-09-30 DIAGNOSIS — I10 Essential (primary) hypertension: Secondary | ICD-10-CM | POA: Diagnosis not present

## 2016-09-30 DIAGNOSIS — G20 Parkinson's disease: Secondary | ICD-10-CM | POA: Diagnosis not present

## 2016-10-03 DIAGNOSIS — I10 Essential (primary) hypertension: Secondary | ICD-10-CM | POA: Diagnosis not present

## 2016-10-03 DIAGNOSIS — Z8701 Personal history of pneumonia (recurrent): Secondary | ICD-10-CM | POA: Diagnosis not present

## 2016-10-03 DIAGNOSIS — M6281 Muscle weakness (generalized): Secondary | ICD-10-CM | POA: Diagnosis not present

## 2016-10-03 DIAGNOSIS — F329 Major depressive disorder, single episode, unspecified: Secondary | ICD-10-CM | POA: Diagnosis not present

## 2016-10-03 DIAGNOSIS — R1310 Dysphagia, unspecified: Secondary | ICD-10-CM | POA: Diagnosis not present

## 2016-10-03 DIAGNOSIS — G20 Parkinson's disease: Secondary | ICD-10-CM | POA: Diagnosis not present

## 2016-10-03 NOTE — Progress Notes (Signed)
Paul Callahan was seen today in the movement disorders clinic for neurologic consultation at the request of [name], MD.  The consultation is for the evaluation of PD.   Pt accompanied by wife and a caregiver who supply most of history.   Pt previously under the care of [name] and I have reviewed his records.  Before that, he saw [name] at [facility].  Started seeing [name] in March, 2013.  He reported having difficulty with gait after a back surgery in 2009.  He was referred to see [name] and ultimately was placed on levodopa, which she did not think was a tremendous value.  He was placed on amantadine, but that caused further falls.  Upon transferring to [name] in March, 2013, he thought his presentation was atypical for idiopathic Parkinson's disease and perhaps had Lewy body dementia or vascular parkinsonism or one of the atypical states.  DaT scan was done in 2014 and demonstrated likely abnormal image grade 1, given patient positioning limitations: Asymmetric uptake with normal or almost normal putamen activity in one hemisphere and with a more marked reduction in the contralateral putamen. patient was last seen by [name] on 09/24/2015.  With the course of time, 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  The patient is now on Exelon patch.  Still on carbidopa/levodopa 25/100, 3 po tid (10:30am/2:30pm/6:30pm) and carbidopa/levodopa 25/100 CR qhs.  Wife asks about potentially decreasing this.  She does state that when he was in a nursing facility in 2016, she asked then to discontinue it and he had an emergence of tremor so it was restarted.     Specific Symptoms:  Tremor: Yes.  , just occasionally (not sure if one or both hands).  Noted rarely with eating rather than rest tremor.   Voice: doesn't verbally talk much with family Sleep: sleeps in hospital bed in same room as wife.  Melatonin gummies, 5mg , 1/2 q hs has  helped, but seemed to be making him a little too sleepy and wife asks me about this today.  She thinks that is why he is overly sleepy today. Postural symptoms:  Yes.  , currently participating in home health physical therapy and that has greatly helped.  Falls?  Rare because someone always with him and if falls the fall is assisted. Urinary Incontinence:  Does well during day but wife cannot get him up at night and so uses diapers.  Caregivers present for 7 days a week since 2016 and they are there 13 hours in the daytime but not there at night. Trouble with ADL's:  Yes.   (he is assisted with dressing, bathing but does brush own teeth, able to feed himself)  Trouble buttoning clothing: Yes.   Memory changes:  Yes.   (wife states that he was dx with dementia perhaps in 2010; wife asks about potentially discontinuing his Exelon) Hallucinations:  Yes.  (mostly when first wakes up in the AM and will see horses, etc out the window.  They are not scary or disturbing)   ALLERGIES:   Allergies  Allergen Reactions  . Amantadines Other (See Comments)    Weakness in legs and hallucinations  . Fentanyl Other (See Comments)    Confusion and disorientation   . Gabapentin Other (See Comments)    Weakness in legs and hallucinations  . Oxycodone Other (See Comments)    Confusion and disorientation   . Phenergan [Promethazine Hcl]  Other (See Comments)    Confusion and disorientation   . Vancomycin Itching, Rash and Other (See Comments)    Red man's syndrome   . Ciprofloxacin Diarrhea and Rash  . Augmentin [Amoxicillin-Pot Clavulanate]     CURRENT MEDICATIONS:  Outpatient Encounter Prescriptions as of 10/04/2016  Medication Sig  . acetaminophen (TYLENOL) 325 MG tablet Take 2 tablets (650 mg total) by mouth every 6 (six) hours as needed for mild pain (or Fever >/= 101).  . Biotin 3 MG TABS Take 6 mg by mouth every evening.  . carbidopa-levodopa (SINEMET CR) 25-100 MG per tablet Take 1 tablet by mouth  at bedtime.  . carbidopa-levodopa (SINEMET IR) 25-100 MG per tablet Take 3 tablets by mouth 3 (three) times daily.  . cholecalciferol (VITAMIN D) 1000 UNITS tablet Take 2,000 Units by mouth daily.   . DULoxetine (CYMBALTA) 60 MG capsule Take 60 mg by mouth 2 (two) times daily.  . EXELON 13.3 MG/24HR PT24 Place 13.3 mg onto the skin daily.   . Melatonin 2.5 MG CAPS Take by mouth.  . Multiple Vitamin (MULTIVITAMIN) tablet Take 1 tablet by mouth daily.  . Multiple Vitamins-Minerals (ICAPS AREDS 2 PO) Take 1 tablet by mouth 2 (two) times daily.  . TURMERIC PO Take by mouth.  . [DISCONTINUED] carvedilol (COREG) 3.125 MG tablet Take 1 tablet (3.125 mg total) by mouth 2 (two) times daily with a meal. (Patient taking differently: Take 3.125 mg by mouth 2 (two) times daily with a meal. 06-21-14 Pt spouse states he is not taking this med.)  . [DISCONTINUED] food thickener (THICK IT) POWD Take 1 Container by mouth as needed (For nectar thick liquids).  . [DISCONTINUED] LORazepam (LORAZEPAM INTENSOL) 2 MG/ML concentrated solution Take 0.5 mLs (1 mg total) by mouth every 8 (eight) hours as needed for anxiety.  . [DISCONTINUED] Morphine Sulfate (MORPHINE CONCENTRATE) 10 mg / 0.5 ml concentrated solution Take 0.25 mLs (5 mg total) by mouth every 2 (two) hours as needed for severe pain.  . [DISCONTINUED] pregabalin (LYRICA) 150 MG capsule Take 150 mg by mouth 2 (two) times daily.  . [DISCONTINUED] Probiotic Product (RISAQUAD-2) CAPS 1 PO twice a day (Patient taking differently: Take 1 capsule by mouth daily. )   No facility-administered encounter medications on file as of 10/04/2016.     PAST MEDICAL HISTORY:   Past Medical History:  Diagnosis Date  . Arthritis   . Bowel obstruction 12/15/2011  . Cancer ([place])    hx of colon cancer(surgery only)  . Dementia   . Depression   . H/O myocardial perfusion scan 07/23/2007   There is evidence of mild ischemia in the apical, mid inferior an apical inferior  region(s). abnormal myocardial perfusion imaging. The post-stress ejection fraction is 52%. This is a high risk scan.  Marland Kitchen History of Holter monitoring 10/03/2012   Syncope and collapse. highest heart rate: 136 bpm at 10/25/2012 9:18. Lowest heart rate: 50 bpm at 4/302014 16:13. Average heart Rate: 66 bpm  . Impaired mobility    since hospital visit 11'15 overall weakness of legs -ambulates with walker" with assist  . Neuromuscular disorder (HCC)    hx of parkinsons  . Parkinson disease [facility])    'tremors are worsened at this time than before"  . Sepsis ([place]) 05/20/2014    PAST SURGICAL HISTORY:   Past Surgical History:  Procedure Laterality Date  . BACK SURGERY     x 5  . CARDIAC CATHETERIZATION  08/02/2007, 01/11/6268   Aortic systolic pressure  034, diastolic pressure 70. Left ventricular systolic pressure 742, end-diastolic pressure 16. There is no apparent pullbck presuures were recorded.  . cataracts Bilateral    '94,'95  . COLON SURGERY     '88 colon resection(cancer)  . EUS N/A 06/18/2014   Procedure: ESOPHAGEAL ENDOSCOPIC ULTRASOUND (EUS) RADIAL;  Surgeon: Arta Silence, MD;  Location: [facility] ENDOSCOPY;  Service: Endoscopy;  Laterality: N/A;  . EYE SURGERY     strabismus  repair '58, laser surgery also after cataract surgery  . FROZEN SHOULDER    . HERNIA REPAIR    . LUMBAR FUSION    . SPINAL CORD STIMULATOR IMPLANT    . TOTAL SHOULDER ARTHROPLASTY Left   . VASECTOMY      SOCIAL HISTORY:   Social History   Social History  . Marital status: Married    Spouse name: N/A  . Number of children: 3  . Years of education: N/A   Occupational History  . Not on file.   Social History Main Topics  . Smoking status: Former Research scientist (life sciences)  . Smokeless tobacco: Never Used  . Alcohol use No  . Drug use: No  . Sexual activity: No   Other Topics Concern  . Not on file   Social History Narrative  . No narrative on file    FAMILY HISTORY:   Family Status  Relation Status  . Mother  Deceased  . Father Deceased  . Sister Alive  . Brother Alive  . Maternal Grandmother Deceased  . Maternal Grandfather Deceased  . Paternal Grandmother Deceased  . Paternal Grandfather Deceased  . Brother Deceased  . Brother Deceased  . Brother Deceased    ROS:  A complete 10 system review of systems was obtained and was unremarkable apart from what is mentioned above.  PHYSICAL EXAMINATION:    VITALS:   Vitals:   10/04/16 1313  BP: 110/60  Pulse: 80    GEN:  The patient appears stated age and is in NAD.  Pt is very somnolent and sleeps much of the visit.  He is arousable and will follow simple commands.   HEENT:  Normocephalic, atraumatic.  The mucous membranes are moist. The superficial temporal arteries are without ropiness or tenderness. CV:  RRR Lungs:  CTAB Neck/HEME:  There are no carotid bruits bilaterally.  Neurological examination:  Orientation:  Montreal Cognitive Assessment  10/04/2016  Visuospatial/ Executive (0/5) 0  Naming (0/3) 2  Attention: Read list of digits (0/2) 2  Attention: Read list of letters (0/1) 0  Attention: Serial 7 subtraction starting at 100 (0/3) 0  Language: Repeat phrase (0/2) 1  Language : Fluency (0/1) 0  Abstraction (0/2) 0  Delayed Recall (0/5) 0  Orientation (0/6) 0  Total 5  Adjusted Score (based on education) 5   Cranial nerves: There is good facial symmetry. I attempted several times to look at his pupils, but he closes the eyes tightly shut and I could not visualize the pupils were the fundus.  The eyes would roll back into the head when I attempted to actively open the eyelids.  He does not participate with extraocular muscle testing or visual field testing.  His speech is limited, but it is clear when it comes out.  It is fluent.   Soft palate rises symmetrically and there is no tongue deviation. Hearing appears to be somewhat decreased to conversational tone. Sensation: Sensation is intact to light and pinprick throughout  (facial, trunk, extremities).  He does not participate with more sensitive  aspects of the sensory testing. Motor: Strength is 5/5 in the bilateral upper and lower extremities.   Shoulder shrug is equal and symmetric.  There is no pronator drift. Deep tendon reflexes: Deep tendon reflexes are 1/4 at the bilateral biceps, triceps, brachioradialis, patella and absent at the bilateral achilles. Plantar responses are downgoing bilaterally.  Movement examination: Tone: There is slightly increased in both upper extremities, right greater than left.  Abnormal movements: None noted Coordination:   He is slow with all RAMs but not necessarily decremation.  Foot taps are slower on the right than the left. Gait and Station: The patient is unable to get up today per caregiver but does usually ambulate/transfer with walker.     ASSESSMENT/PLAN:  1.  Parkinsonism  -Sounds like the patient began to have a problem in 2009.  His DaT scan in 2014 was weakly positive, stating "likely abnormal image grade 1."  [name] felt that this was most likely cortical basal ganglionic degeneration, although the symmetry of findings would make this somewhat unusual.  CT of the brain was done in 2016 demonstrating white matter disease.  Exact type of parkinsonian syndrome is likely irrelevant at this time given level of dementia.     - Patient's wife and like to attempt to decrease levodopa load from carbidopa/levodopa 25/100, 3 tablets 3 times per day and carbidopa/levodopa 25/100 CR q hs.  I think that is reasonable.  We will drop from 3 tablets 3 times per day to 2 tablets 3 times per day and continue the carbidopa/levodopa 25/100 CR at bedtime.  She does tell me that she tried to discontinue it in the past and he had some tremor.    2.  Severe dementia  -Wife would like to stop the Exelon patch, partially because it is expensive and partially because she is not sure if it is helping.  I really did not want to change two things  at one time, so we will call her in 6 weeks to see how he is doing.  I talked to her about the risks of stopping the Exelon patch, although I am not sure if it is helping given the degree of dementia.  I did tell her that if it was helping hallucinations, it is possible that these could increase when it is stopped, although decreasing levodopa load may help this as well.  3.  Insomnia  -Patient did well with melatonin and was previously having day/night reversal.  However, he seems to be having a hangover effect even if he gets 2.5 mg of melatonin and is sleeping more during the day.  Told his wife to try to find the 1 mg gummies or sublingual melatonin.  4.   His wife and like him to be seen in 3 months, although I told him he did not need to be seen for 6 months.  I'm happy to see him back in 3 months if that makes his wife were comfortable.  Greater than 50% of the 60 minute visit spent in counseling/coordinating care.  Cc:  [name], MD

## 2016-10-04 ENCOUNTER — Encounter: Payer: Self-pay | Admitting: Neurology

## 2016-10-04 ENCOUNTER — Ambulatory Visit (INDEPENDENT_AMBULATORY_CARE_PROVIDER_SITE_OTHER): Payer: Medicare Other | Admitting: Neurology

## 2016-10-04 VITALS — BP 110/60 | HR 80

## 2016-10-04 DIAGNOSIS — G20A1 Parkinson's disease without dyskinesia, without mention of fluctuations: Secondary | ICD-10-CM

## 2016-10-04 DIAGNOSIS — F0280 Dementia in other diseases classified elsewhere without behavioral disturbance: Secondary | ICD-10-CM | POA: Diagnosis not present

## 2016-10-04 DIAGNOSIS — R443 Hallucinations, unspecified: Secondary | ICD-10-CM

## 2016-10-04 DIAGNOSIS — G20 Parkinson's disease: Secondary | ICD-10-CM

## 2016-10-04 DIAGNOSIS — G20C Parkinsonism, unspecified: Secondary | ICD-10-CM

## 2016-10-04 NOTE — Patient Instructions (Addendum)
Decrease Levodopa as follows:  Carbidopa Levodopa 25/100 IR - 2 in the morning, 2 in the afternoon, 2 in the evening Continue Carbidopa Levodopa 25/100 CR - 1 at bedtime I will call in 6 weeks to see if this is okay and we may stop Exelon patches  Try to find gummy/liquid vitamins for Melatonin 1 mg and see if this helps

## 2016-10-05 DIAGNOSIS — Z8701 Personal history of pneumonia (recurrent): Secondary | ICD-10-CM | POA: Diagnosis not present

## 2016-10-05 DIAGNOSIS — M6281 Muscle weakness (generalized): Secondary | ICD-10-CM | POA: Diagnosis not present

## 2016-10-05 DIAGNOSIS — I10 Essential (primary) hypertension: Secondary | ICD-10-CM | POA: Diagnosis not present

## 2016-10-05 DIAGNOSIS — R1310 Dysphagia, unspecified: Secondary | ICD-10-CM | POA: Diagnosis not present

## 2016-10-05 DIAGNOSIS — G20 Parkinson's disease: Secondary | ICD-10-CM | POA: Diagnosis not present

## 2016-10-05 DIAGNOSIS — F329 Major depressive disorder, single episode, unspecified: Secondary | ICD-10-CM | POA: Diagnosis not present

## 2016-10-08 DIAGNOSIS — Z8701 Personal history of pneumonia (recurrent): Secondary | ICD-10-CM | POA: Diagnosis not present

## 2016-10-08 DIAGNOSIS — F329 Major depressive disorder, single episode, unspecified: Secondary | ICD-10-CM | POA: Diagnosis not present

## 2016-10-08 DIAGNOSIS — M6281 Muscle weakness (generalized): Secondary | ICD-10-CM | POA: Diagnosis not present

## 2016-10-08 DIAGNOSIS — G20 Parkinson's disease: Secondary | ICD-10-CM | POA: Diagnosis not present

## 2016-10-08 DIAGNOSIS — R1310 Dysphagia, unspecified: Secondary | ICD-10-CM | POA: Diagnosis not present

## 2016-10-08 DIAGNOSIS — I10 Essential (primary) hypertension: Secondary | ICD-10-CM | POA: Diagnosis not present

## 2016-10-11 DIAGNOSIS — M6281 Muscle weakness (generalized): Secondary | ICD-10-CM | POA: Diagnosis not present

## 2016-10-11 DIAGNOSIS — R1310 Dysphagia, unspecified: Secondary | ICD-10-CM | POA: Diagnosis not present

## 2016-10-11 DIAGNOSIS — Z8701 Personal history of pneumonia (recurrent): Secondary | ICD-10-CM | POA: Diagnosis not present

## 2016-10-11 DIAGNOSIS — F329 Major depressive disorder, single episode, unspecified: Secondary | ICD-10-CM | POA: Diagnosis not present

## 2016-10-11 DIAGNOSIS — G20 Parkinson's disease: Secondary | ICD-10-CM | POA: Diagnosis not present

## 2016-10-11 DIAGNOSIS — I10 Essential (primary) hypertension: Secondary | ICD-10-CM | POA: Diagnosis not present

## 2016-10-12 DIAGNOSIS — J189 Pneumonia, unspecified organism: Secondary | ICD-10-CM | POA: Diagnosis not present

## 2016-10-13 DIAGNOSIS — M6281 Muscle weakness (generalized): Secondary | ICD-10-CM | POA: Diagnosis not present

## 2016-10-13 DIAGNOSIS — I10 Essential (primary) hypertension: Secondary | ICD-10-CM | POA: Diagnosis not present

## 2016-10-13 DIAGNOSIS — G20 Parkinson's disease: Secondary | ICD-10-CM | POA: Diagnosis not present

## 2016-10-13 DIAGNOSIS — Z8701 Personal history of pneumonia (recurrent): Secondary | ICD-10-CM | POA: Diagnosis not present

## 2016-10-13 DIAGNOSIS — R1310 Dysphagia, unspecified: Secondary | ICD-10-CM | POA: Diagnosis not present

## 2016-10-13 DIAGNOSIS — F329 Major depressive disorder, single episode, unspecified: Secondary | ICD-10-CM | POA: Diagnosis not present

## 2016-10-17 DIAGNOSIS — R1310 Dysphagia, unspecified: Secondary | ICD-10-CM | POA: Diagnosis not present

## 2016-10-17 DIAGNOSIS — F329 Major depressive disorder, single episode, unspecified: Secondary | ICD-10-CM | POA: Diagnosis not present

## 2016-10-17 DIAGNOSIS — Z8701 Personal history of pneumonia (recurrent): Secondary | ICD-10-CM | POA: Diagnosis not present

## 2016-10-17 DIAGNOSIS — G20 Parkinson's disease: Secondary | ICD-10-CM | POA: Diagnosis not present

## 2016-10-17 DIAGNOSIS — I10 Essential (primary) hypertension: Secondary | ICD-10-CM | POA: Diagnosis not present

## 2016-10-17 DIAGNOSIS — M6281 Muscle weakness (generalized): Secondary | ICD-10-CM | POA: Diagnosis not present

## 2016-10-19 ENCOUNTER — Other Ambulatory Visit: Payer: Self-pay | Admitting: Internal Medicine

## 2016-10-19 DIAGNOSIS — R9389 Abnormal findings on diagnostic imaging of other specified body structures: Secondary | ICD-10-CM

## 2016-10-19 DIAGNOSIS — F329 Major depressive disorder, single episode, unspecified: Secondary | ICD-10-CM | POA: Diagnosis not present

## 2016-10-19 DIAGNOSIS — Z8701 Personal history of pneumonia (recurrent): Secondary | ICD-10-CM | POA: Diagnosis not present

## 2016-10-19 DIAGNOSIS — I10 Essential (primary) hypertension: Secondary | ICD-10-CM | POA: Diagnosis not present

## 2016-10-19 DIAGNOSIS — R1310 Dysphagia, unspecified: Secondary | ICD-10-CM | POA: Diagnosis not present

## 2016-10-19 DIAGNOSIS — G20 Parkinson's disease: Secondary | ICD-10-CM | POA: Diagnosis not present

## 2016-10-19 DIAGNOSIS — M6281 Muscle weakness (generalized): Secondary | ICD-10-CM | POA: Diagnosis not present

## 2016-10-24 ENCOUNTER — Ambulatory Visit (INDEPENDENT_AMBULATORY_CARE_PROVIDER_SITE_OTHER): Payer: Medicare Other | Admitting: Physician Assistant

## 2016-10-24 ENCOUNTER — Encounter (INDEPENDENT_AMBULATORY_CARE_PROVIDER_SITE_OTHER): Payer: Self-pay | Admitting: Physician Assistant

## 2016-10-24 DIAGNOSIS — M170 Bilateral primary osteoarthritis of knee: Secondary | ICD-10-CM | POA: Diagnosis not present

## 2016-10-24 MED ORDER — METHYLPREDNISOLONE ACETATE 40 MG/ML IJ SUSP
40.0000 mg | INTRAMUSCULAR | Status: AC | PRN
  Administered 2016-10-24: 40 mg via INTRA_ARTICULAR

## 2016-10-24 MED ORDER — LIDOCAINE HCL 1 % IJ SOLN
3.0000 mL | INTRAMUSCULAR | Status: AC | PRN
  Administered 2016-10-24: 3 mL

## 2016-10-24 NOTE — Progress Notes (Signed)
Office Visit Note   Patient: Paul Callahan           Date of Birth: Jul 26, 1923           MRN: [id number] Visit Date: 10/24/2016              Requested by: [name], MD [address], [place], [place] [address] PCP: [name], MD   Assessment & Plan: Visit Diagnoses:  1. Primary osteoarthritis of both knees     Plan: He will follow with [place] on an as-needed basis for injections in the knees. Did discuss with him working on range of motion knees is his extension lacks about 10 both knees  Follow-Up Instructions: Return if symptoms worsen or fail to improve.   Orders:  Orders Placed This Encounter  Procedures  . Large Joint Injection/Arthrocentesis  . Large Joint Injection/Arthrocentesis   No orders of the defined types were placed in this encounter.     Procedures: Large Joint Inj Date/Time: 10/24/2016 2:23 PM Performed by: Pete Pelt Authorized by: Pete Pelt   Consent Given by:  Patient Indications:  Pain Location:  Knee Site:  L knee Needle Size:  25 G Needle Length:  3.5 inches Approach:  Anterolateral Ultrasound Guidance: No   Fluoroscopic Guidance: No   Medications:  40 mg methylPREDNISolone acetate 40 MG/ML; 3 mL lidocaine 1 % Aspiration Attempted: No   Patient tolerance:  Patient tolerated the procedure well with no immediate complications Large Joint Inj Date/Time: 10/24/2016 2:24 PM Performed by: Pete Pelt Authorized by: Pete Pelt   Consent Given by:  Patient Indications:  Pain Location:  Knee Site:  R knee Needle Size:  22 G Approach:  Anterolateral Ultrasound Guidance: No   Fluoroscopic Guidance: No   Medications:  40 mg methylPREDNISolone acetate 40 MG/ML; 3 mL lidocaine 1 % Aspiration Attempted: No   Patient tolerance:  Patient tolerated the procedure well with no immediate complications     Clinical Data: No additional findings.   Subjective: Chief Complaint  Patient presents  with  . Left Knee - Pain  . Right Knee - Pain    HPI Paul Callahan returns today due to bilateral knee pain is requesting injections in both knees. Patient has known arthritis of both knees. Last was given injections 02/11/2016. Takes Tylenol for pain sometimes Celebrex or Aleve. Does not walk much. he has Parkinson's. Review of Systems   Objective: Vital Signs: There were no vitals taken for this visit.  Physical Exam Well-developed well-nourished male seen in wheelchair in no acute distress. Ortho Exam Bilateral knees he has approximately 110 flexion both knees. Lacks about 10 full extension of both knees. No instability of either knee. No effusion no abnormal warmth of either knee. Specialty Comments:  No specialty comments available.  Imaging: No results found.   PMFS History: Patient Active Problem List   Diagnosis Date Noted  . Palliative care encounter 08/25/2014  . Decreased appetite 08/25/2014  . Leg pain, bilateral 08/25/2014  . Pyrexia   . Dysphagia 08/18/2014  . Essential hypertension 08/18/2014  . Failure to thrive in adult   . Aspiration pneumonia ([place]) 08/17/2014  . Atelectasis 08/17/2014  . Pancreatic lesion 08/17/2014  . Pancreatitis, acute 08/17/2014  . Dementia with behavioral disturbance   . Weakness 08/16/2014  . Leukocytosis 08/16/2014  . Acute encephalopathy 08/16/2014  . Blood poisoning   . Enterobacter sepsis ([place])   . UTI (lower urinary tract infection) 08/15/2014  . Pancreatic  mass 05/21/2014  . Bacteremia 05/21/2014  . Altered mental status   . Sepsis ([place]) 05/18/2014  . Transaminitis 05/18/2014  . Dementia without behavioral disturbance   . Abnormal nuclear stress test in 2009 - subsequent cath revealed normal coronaries w/ EF >60%, no WMA 09/28/2012  . Syncope 09/27/2012  . Parkinsonism ([place]) 09/27/2012  . Physical deconditioning 12/22/2011  . SBO (small bowel obstruction) ([place]) 12/16/2011  . Acute kidney injury ([place]) 12/16/2011  .  Thrombocytopenia ([place]) 12/16/2011   Past Medical History:  Diagnosis Date  . Arthritis   . Bowel obstruction ([place]) 12/15/2011  . Cancer ([place])    hx of colon cancer(surgery only)  . Dementia   . Depression   . H/O myocardial perfusion scan 07/23/2007   There is evidence of mild ischemia in the apical, mid inferior an apical inferior region(s). abnormal myocardial perfusion imaging. The post-stress ejection fraction is 52%. This is a high risk scan.  Marland Kitchen History of Holter monitoring 10/03/2012   Syncope and collapse. highest heart rate: 136 bpm at 10/25/2012 9:18. Lowest heart rate: 50 bpm at 4/302014 16:13. Average heart Rate: 66 bpm  . Impaired mobility    since hospital visit 11'15 overall weakness of legs -ambulates with walker" with assist  . Neuromuscular disorder (HCC)    hx of parkinsons  . Parkinson disease [facility])    'tremors are worsened at this time than before"  . Sepsis ([place]) 05/20/2014    Family History  Problem Relation Age of Onset  . Hypertension Mother   . Stroke Mother   . Heart attack Father   . Hypertension Sister   . Kidney disease Brother   . Lung cancer Brother   . Heart disease Brother   . Rheum arthritis Maternal Grandmother   . ALS Brother   . Heart attack Brother   . Hypertension Brother   . Lung cancer Brother     Past Surgical History:  Procedure Laterality Date  . BACK SURGERY     x 5  . CARDIAC CATHETERIZATION  08/02/2007, 9/37/1696   Aortic systolic pressure 789, diastolic pressure 70. Left ventricular systolic pressure 381, end-diastolic pressure 16. There is no apparent pullbck presuures were recorded.  . cataracts Bilateral    '94,'95  . COLON SURGERY     '88 colon resection(cancer)  . EUS N/A 06/18/2014   Procedure: ESOPHAGEAL ENDOSCOPIC ULTRASOUND (EUS) RADIAL;  Surgeon: Arta Silence, MD;  Location: [facility] ENDOSCOPY;  Service: Endoscopy;  Laterality: N/A;  . EYE SURGERY     strabismus  repair '58, laser surgery also after cataract surgery  .  FROZEN SHOULDER    . HERNIA REPAIR    . LUMBAR FUSION    . SPINAL CORD STIMULATOR IMPLANT    . TOTAL SHOULDER ARTHROPLASTY Left   . VASECTOMY     Social History   Occupational History  . Not on file.   Social History Main Topics  . Smoking status: Former Research scientist (life sciences)  . Smokeless tobacco: Never Used  . Alcohol use No  . Drug use: No  . Sexual activity: No

## 2016-10-25 ENCOUNTER — Ambulatory Visit
Admission: RE | Admit: 2016-10-25 | Discharge: 2016-10-25 | Disposition: A | Discharge status: Home / Self Care | Payer: Medicare Other | Source: Ambulatory Visit | Attending: Internal Medicine | Admitting: Internal Medicine

## 2016-10-25 DIAGNOSIS — R9389 Abnormal findings on diagnostic imaging of other specified body structures: Secondary | ICD-10-CM

## 2016-10-25 DIAGNOSIS — F329 Major depressive disorder, single episode, unspecified: Secondary | ICD-10-CM | POA: Diagnosis not present

## 2016-10-25 DIAGNOSIS — G20 Parkinson's disease: Secondary | ICD-10-CM | POA: Diagnosis not present

## 2016-10-25 DIAGNOSIS — Z8701 Personal history of pneumonia (recurrent): Secondary | ICD-10-CM | POA: Diagnosis not present

## 2016-10-25 DIAGNOSIS — R1310 Dysphagia, unspecified: Secondary | ICD-10-CM | POA: Diagnosis not present

## 2016-10-25 DIAGNOSIS — R918 Other nonspecific abnormal finding of lung field: Secondary | ICD-10-CM | POA: Diagnosis not present

## 2016-10-25 DIAGNOSIS — M6281 Muscle weakness (generalized): Secondary | ICD-10-CM | POA: Diagnosis not present

## 2016-10-25 DIAGNOSIS — I10 Essential (primary) hypertension: Secondary | ICD-10-CM | POA: Diagnosis not present

## 2016-10-27 DIAGNOSIS — G20 Parkinson's disease: Secondary | ICD-10-CM | POA: Diagnosis not present

## 2016-10-27 DIAGNOSIS — Z8701 Personal history of pneumonia (recurrent): Secondary | ICD-10-CM | POA: Diagnosis not present

## 2016-10-27 DIAGNOSIS — R1310 Dysphagia, unspecified: Secondary | ICD-10-CM | POA: Diagnosis not present

## 2016-10-27 DIAGNOSIS — M6281 Muscle weakness (generalized): Secondary | ICD-10-CM | POA: Diagnosis not present

## 2016-10-27 DIAGNOSIS — F329 Major depressive disorder, single episode, unspecified: Secondary | ICD-10-CM | POA: Diagnosis not present

## 2016-10-27 DIAGNOSIS — I10 Essential (primary) hypertension: Secondary | ICD-10-CM | POA: Diagnosis not present

## 2016-11-01 DIAGNOSIS — G20 Parkinson's disease: Secondary | ICD-10-CM | POA: Diagnosis not present

## 2016-11-01 DIAGNOSIS — M6281 Muscle weakness (generalized): Secondary | ICD-10-CM | POA: Diagnosis not present

## 2016-11-01 DIAGNOSIS — Z8701 Personal history of pneumonia (recurrent): Secondary | ICD-10-CM | POA: Diagnosis not present

## 2016-11-01 DIAGNOSIS — R1310 Dysphagia, unspecified: Secondary | ICD-10-CM | POA: Diagnosis not present

## 2016-11-01 DIAGNOSIS — F329 Major depressive disorder, single episode, unspecified: Secondary | ICD-10-CM | POA: Diagnosis not present

## 2016-11-01 DIAGNOSIS — I10 Essential (primary) hypertension: Secondary | ICD-10-CM | POA: Diagnosis not present

## 2016-11-03 DIAGNOSIS — R1310 Dysphagia, unspecified: Secondary | ICD-10-CM | POA: Diagnosis not present

## 2016-11-03 DIAGNOSIS — F329 Major depressive disorder, single episode, unspecified: Secondary | ICD-10-CM | POA: Diagnosis not present

## 2016-11-03 DIAGNOSIS — M6281 Muscle weakness (generalized): Secondary | ICD-10-CM | POA: Diagnosis not present

## 2016-11-03 DIAGNOSIS — G20 Parkinson's disease: Secondary | ICD-10-CM | POA: Diagnosis not present

## 2016-11-03 DIAGNOSIS — I10 Essential (primary) hypertension: Secondary | ICD-10-CM | POA: Diagnosis not present

## 2016-11-03 DIAGNOSIS — Z8701 Personal history of pneumonia (recurrent): Secondary | ICD-10-CM | POA: Diagnosis not present

## 2016-11-07 DIAGNOSIS — M6281 Muscle weakness (generalized): Secondary | ICD-10-CM | POA: Diagnosis not present

## 2016-11-07 DIAGNOSIS — G20 Parkinson's disease: Secondary | ICD-10-CM | POA: Diagnosis not present

## 2016-11-07 DIAGNOSIS — R1310 Dysphagia, unspecified: Secondary | ICD-10-CM | POA: Diagnosis not present

## 2016-11-07 DIAGNOSIS — F329 Major depressive disorder, single episode, unspecified: Secondary | ICD-10-CM | POA: Diagnosis not present

## 2016-11-07 DIAGNOSIS — I10 Essential (primary) hypertension: Secondary | ICD-10-CM | POA: Diagnosis not present

## 2016-11-07 DIAGNOSIS — Z8701 Personal history of pneumonia (recurrent): Secondary | ICD-10-CM | POA: Diagnosis not present

## 2016-11-09 DIAGNOSIS — R1310 Dysphagia, unspecified: Secondary | ICD-10-CM | POA: Diagnosis not present

## 2016-11-09 DIAGNOSIS — G20 Parkinson's disease: Secondary | ICD-10-CM | POA: Diagnosis not present

## 2016-11-09 DIAGNOSIS — Z8701 Personal history of pneumonia (recurrent): Secondary | ICD-10-CM | POA: Diagnosis not present

## 2016-11-09 DIAGNOSIS — I10 Essential (primary) hypertension: Secondary | ICD-10-CM | POA: Diagnosis not present

## 2016-11-09 DIAGNOSIS — F329 Major depressive disorder, single episode, unspecified: Secondary | ICD-10-CM | POA: Diagnosis not present

## 2016-11-09 DIAGNOSIS — M6281 Muscle weakness (generalized): Secondary | ICD-10-CM | POA: Diagnosis not present

## 2016-11-14 DIAGNOSIS — G20 Parkinson's disease: Secondary | ICD-10-CM | POA: Diagnosis not present

## 2016-11-14 DIAGNOSIS — I10 Essential (primary) hypertension: Secondary | ICD-10-CM | POA: Diagnosis not present

## 2016-11-14 DIAGNOSIS — M6281 Muscle weakness (generalized): Secondary | ICD-10-CM | POA: Diagnosis not present

## 2016-11-14 DIAGNOSIS — R1310 Dysphagia, unspecified: Secondary | ICD-10-CM | POA: Diagnosis not present

## 2016-11-14 DIAGNOSIS — Z8701 Personal history of pneumonia (recurrent): Secondary | ICD-10-CM | POA: Diagnosis not present

## 2016-11-14 DIAGNOSIS — F329 Major depressive disorder, single episode, unspecified: Secondary | ICD-10-CM | POA: Diagnosis not present

## 2016-11-16 ENCOUNTER — Emergency Department (HOSPITAL_COMMUNITY)
Admission: EM | Admit: 2016-11-16 | Discharge: 2016-11-16 | Disposition: A | Discharge status: Home / Self Care | Payer: Medicare Other | Attending: Emergency Medicine | Admitting: Emergency Medicine

## 2016-11-16 ENCOUNTER — Emergency Department (HOSPITAL_COMMUNITY): Payer: Medicare Other

## 2016-11-16 ENCOUNTER — Telehealth: Payer: Self-pay | Admitting: Neurology

## 2016-11-16 DIAGNOSIS — Z87891 Personal history of nicotine dependence: Secondary | ICD-10-CM | POA: Insufficient documentation

## 2016-11-16 DIAGNOSIS — Z96612 Presence of left artificial shoulder joint: Secondary | ICD-10-CM | POA: Diagnosis not present

## 2016-11-16 DIAGNOSIS — T676XXA Heat fatigue, transient, initial encounter: Secondary | ICD-10-CM | POA: Diagnosis not present

## 2016-11-16 DIAGNOSIS — I10 Essential (primary) hypertension: Secondary | ICD-10-CM | POA: Diagnosis not present

## 2016-11-16 DIAGNOSIS — J9811 Atelectasis: Secondary | ICD-10-CM | POA: Diagnosis not present

## 2016-11-16 DIAGNOSIS — R4182 Altered mental status, unspecified: Secondary | ICD-10-CM | POA: Diagnosis not present

## 2016-11-16 DIAGNOSIS — Z79899 Other long term (current) drug therapy: Secondary | ICD-10-CM | POA: Insufficient documentation

## 2016-11-16 DIAGNOSIS — R531 Weakness: Secondary | ICD-10-CM | POA: Diagnosis not present

## 2016-11-16 DIAGNOSIS — R5383 Other fatigue: Secondary | ICD-10-CM | POA: Diagnosis not present

## 2016-11-16 DIAGNOSIS — G20 Parkinson's disease: Secondary | ICD-10-CM | POA: Insufficient documentation

## 2016-11-16 DIAGNOSIS — Z85038 Personal history of other malignant neoplasm of large intestine: Secondary | ICD-10-CM | POA: Insufficient documentation

## 2016-11-16 DIAGNOSIS — R402441 Other coma, without documented Glasgow coma scale score, or with partial score reported, in the field [EMT or ambulance]: Secondary | ICD-10-CM | POA: Diagnosis not present

## 2016-11-16 LAB — CBC WITH DIFFERENTIAL/PLATELET
Basophils Absolute: 0 10*3/uL (ref 0.0–0.1)
Basophils Relative: 0 %
EOS ABS: 0.1 10*3/uL (ref 0.0–0.7)
Eosinophils Relative: 1 %
HEMATOCRIT: 43.5 % (ref 39.0–52.0)
HEMOGLOBIN: 14.2 g/dL (ref 13.0–17.0)
LYMPHS ABS: 1.7 10*3/uL (ref 0.7–4.0)
Lymphocytes Relative: 22 %
MCH: 30.7 pg (ref 26.0–34.0)
MCHC: 32.6 g/dL (ref 30.0–36.0)
MCV: 94.2 fL (ref 78.0–100.0)
MONOS PCT: 6 %
Monocytes Absolute: 0.5 10*3/uL (ref 0.1–1.0)
NEUTROS ABS: 5.7 10*3/uL (ref 1.7–7.7)
NEUTROS PCT: 71 %
Platelets: 200 10*3/uL (ref 150–400)
RBC: 4.62 MIL/uL (ref 4.22–5.81)
RDW: 14.3 % (ref 11.5–15.5)
WBC: 8.1 10*3/uL (ref 4.0–10.5)

## 2016-11-16 LAB — COMPREHENSIVE METABOLIC PANEL
ALBUMIN: 4.4 g/dL (ref 3.5–5.0)
ALK PHOS: 68 U/L (ref 38–126)
ALT: 18 U/L (ref 17–63)
ANION GAP: 8 (ref 5–15)
AST: 20 U/L (ref 15–41)
BUN: 24 mg/dL — ABNORMAL HIGH (ref 6–20)
CHLORIDE: 102 mmol/L (ref 101–111)
CO2: 29 mmol/L (ref 22–32)
CREATININE: 0.91 mg/dL (ref 0.61–1.24)
Calcium: 9.5 mg/dL (ref 8.9–10.3)
GFR calc Af Amer: >60 mL/min (ref >60)
GFR calc non Af Amer: >60 mL/min (ref >60)
Glucose, Bld: 112 mg/dL — ABNORMAL HIGH (ref 65–99)
Potassium: 4.3 mmol/L (ref 3.5–5.1)
SODIUM: 139 mmol/L (ref 135–145)
Total Bilirubin: 0.8 mg/dL (ref 0.3–1.2)
Total Protein: 7.6 g/dL (ref 6.5–8.1)

## 2016-11-16 LAB — I-STAT TROPONIN, ED: TROPONIN I, POC: 0.01 ng/mL (ref 0.00–0.08)

## 2016-11-16 LAB — URINALYSIS, ROUTINE W REFLEX MICROSCOPIC
BACTERIA UA: NONE SEEN
BILIRUBIN URINE: NEGATIVE
Glucose, UA: NEGATIVE mg/dL
KETONES UR: NEGATIVE mg/dL
Leukocytes, UA: NEGATIVE
Nitrite: NEGATIVE
Protein, ur: NEGATIVE mg/dL
Specific Gravity, Urine: 1.009 (ref 1.005–1.030)
pH: 7 (ref 5.0–8.0)

## 2016-11-16 NOTE — ED Notes (Signed)
BLADDER SCAN RESULTS 35mL's

## 2016-11-16 NOTE — ED Notes (Signed)
2 unsuccessful attempts to collect blood samples

## 2016-11-16 NOTE — Care Management (Addendum)
ED CM noted note from [facility] with medication changes dated today. ED CM alerted [facility] ED RN to follow up with note prior to discharge. [place] Referral was faxed to [facility].  No further ED needs identified.

## 2016-11-16 NOTE — ED Triage Notes (Signed)
Per EMS, pt is from home and coming in due to daughter calling and stating that pt is lethargic and not "acting like himself" today. CNA on site stated that this is baseline for pt until pt arises and starts the day. Daughter also states pt has a productive cough. Pt is prone to aspiration.

## 2016-11-16 NOTE — Telephone Encounter (Signed)
Left message on machine for patient to call back.  Calling to see how he is doing after this change:  Decrease Levodopa as follows:  Carbidopa Levodopa 25/100 IR - 2 in the morning, 2 in the afternoon, 2 in the evening Continue Carbidopa Levodopa 25/100 CR - 1 at bedtime I will call in 6 weeks to see if this is okay and we may stop Exelon patches  Awaiting call back to discuss.

## 2016-11-16 NOTE — Progress Notes (Signed)
ED CM spoke with patient and daughter over the phone patient presented to [facility] ED due to  Weakness  As per daughter.  Patient lives at home with wife primary care giver who is now hospitalized.  Patient has PCA attendants 3 days per week private paid.  EDP is recommending [place] services. Daughter is agreeable. She believes that patient has had [facility] in the past and would like to establish services with them again. Referral faxed to [facility] via [place].  CM explained that a nurse from [facility] will contact them at the verified number 24- 48 hours post discharge. She verbalized understanding teach back done. Information placed on his AVS. No further ED CM needs identified.

## 2016-11-16 NOTE — ED Notes (Signed)
Bed: TF57 Expected date:  Expected time:  Means of arrival:  Comments: EMS- 83yo M, AMS

## 2016-11-16 NOTE — ED Notes (Signed)
[facility] arrived.  

## 2016-11-16 NOTE — ED Notes (Signed)
Condom catheter applied.

## 2016-11-16 NOTE — ED Notes (Signed)
[facility] called  

## 2016-11-16 NOTE — ED Notes (Signed)
CM called to reports changes in medication and to review note. Review note stating  changes to Carbidopa dosage, no MD was indicated on whom made the changes. Discussed with Dr. Wilson Singer who stated that pt needs to f/u with PCP regarding changes. Discussed this with Pt Son, to contact PCP regarding medication changes understanding  Verbalized. Pt son is at bedside during discharge.

## 2016-11-16 NOTE — ED Notes (Signed)
Attempted blood draw x2, was unsuccessful 

## 2016-11-16 NOTE — ED Provider Notes (Signed)
[facility] DEPT Provider Note   CSN: [id number] Arrival date & time: 11/16/16  1107     History   Chief Complaint Chief Complaint  Patient presents with  . Weakness    HPI Paul Callahan is a 83 y.o. male.  The history is provided by the EMS personnel. No language interpreter was used.  Weakness     Paul Callahan is a 83 y.o. male who presents to the Emergency Department complaining of AMS.  Level V caveat due to dementia.  History is provided by EMS. They state that the daughter called 911 today due to change in mental status. Patient resides at home with his wife and is cared for by his wife and a CNA. EMS states that the wife is very sick and will not be able to care for her husband. Daughter was concerned that patient's mental status was changed from baseline with increased confusion and lethargy compared to normal. CNA at the residence states that this is the patient's baseline mental status at this time of day. Daughter endorses productive cough, no additional complaints. At baseline the patient requires assistance with ADLs and is wheelchair and bedbound.  Past Medical History:  Diagnosis Date  . Arthritis   . Bowel obstruction ([place]) 12/15/2011  . Cancer ([place])    hx of colon cancer(surgery only)  . Dementia   . Depression   . H/O myocardial perfusion scan 07/23/2007   There is evidence of mild ischemia in the apical, mid inferior an apical inferior region(s). abnormal myocardial perfusion imaging. The post-stress ejection fraction is 52%. This is a high risk scan.  Marland Kitchen History of Holter monitoring 10/03/2012   Syncope and collapse. highest heart rate: 136 bpm at 10/25/2012 9:18. Lowest heart rate: 50 bpm at 4/302014 16:13. Average heart Rate: 66 bpm  . Impaired mobility    since hospital visit 11'15 overall weakness of legs -ambulates with walker" with assist  . Neuromuscular disorder (HCC)    hx of parkinsons  . Parkinson disease [facility])    'tremors are worsened at this  time than before"  . Sepsis ([place]) 05/20/2014    Patient Active Problem List   Diagnosis Date Noted  . Palliative care encounter 08/25/2014  . Decreased appetite 08/25/2014  . Leg pain, bilateral 08/25/2014  . Pyrexia   . Dysphagia 08/18/2014  . Essential hypertension 08/18/2014  . Failure to thrive in adult   . Aspiration pneumonia ([place]) 08/17/2014  . Atelectasis 08/17/2014  . Pancreatic lesion 08/17/2014  . Pancreatitis, acute 08/17/2014  . Dementia with behavioral disturbance   . Weakness 08/16/2014  . Leukocytosis 08/16/2014  . Acute encephalopathy 08/16/2014  . Blood poisoning   . Enterobacter sepsis ([place])   . UTI (lower urinary tract infection) 08/15/2014  . Pancreatic mass 05/21/2014  . Bacteremia 05/21/2014  . Altered mental status   . Sepsis ([place]) 05/18/2014  . Transaminitis 05/18/2014  . Dementia without behavioral disturbance   . Abnormal nuclear stress test in 2009 - subsequent cath revealed normal coronaries w/ EF >60%, no WMA 09/28/2012  . Syncope 09/27/2012  . Parkinsonism ([place]) 09/27/2012  . Physical deconditioning 12/22/2011  . SBO (small bowel obstruction) ([place]) 12/16/2011  . Acute kidney injury ([place]) 12/16/2011  . Thrombocytopenia ([place]) 12/16/2011    Past Surgical History:  Procedure Laterality Date  . BACK SURGERY     x 5  . CARDIAC CATHETERIZATION  08/02/2007, 5/42/7062   Aortic systolic pressure 376, diastolic pressure 70. Left ventricular systolic pressure 283, end-diastolic  pressure 16. There is no apparent pullbck presuures were recorded.  . cataracts Bilateral    '94,'95  . COLON SURGERY     '88 colon resection(cancer)  . EUS N/A 06/18/2014   Procedure: ESOPHAGEAL ENDOSCOPIC ULTRASOUND (EUS) RADIAL;  Surgeon: Arta Silence, MD;  Location[facility];  Service: Endoscopy;  Laterality: N/A;  . EYE SURGERY     strabismus  repair '58, laser surgery also after cataract surgery  . FROZEN SHOULDER    . HERNIA REPAIR    . LUMBAR FUSION    .  SPINAL CORD STIMULATOR IMPLANT    . TOTAL SHOULDER ARTHROPLASTY Left   . VASECTOMY         Home Medications    Prior to Admission medications   Medication Sig Start Date End Date Taking? Authorizing Provider  acetaminophen (TYLENOL) 325 MG tablet Take 2 tablets (650 mg total) by mouth every 6 (six) hours as needed for mild pain (or Fever >/= 101). 08/25/14  Yes Abrol, Ascencion Dike, MD  Biotin 3 MG TABS Take 6 mg by mouth every evening.   Yes [provider]  carbidopa-levodopa (SINEMET CR) 25-100 MG per tablet Take 1 tablet by mouth at bedtime.   Yes [provider]  carbidopa-levodopa (SINEMET IR) 25-100 MG per tablet Take 3 tablets by mouth 3 (three) times daily.   Yes [provider]  celecoxib (CELEBREX) 200 MG capsule Take 200 mg by mouth 2 (two) times daily.   Yes [provider]  cholecalciferol (VITAMIN D) 1000 UNITS tablet Take 2,000 Units by mouth daily.    Yes [provider]  DULoxetine (CYMBALTA) 60 MG capsule Take 60 mg by mouth 2 (two) times daily.   Yes [provider]  EXELON 13.3 MG/24HR PT24 Place 13.3 mg onto the skin daily.  05/27/14  Yes [provider]  Multiple Vitamin (MULTIVITAMIN) tablet Take 1 tablet by mouth daily.   Yes [provider]  Multiple Vitamins-Minerals (ICAPS AREDS 2 PO) Take 1 capsule by mouth 2 (two) times daily.   Yes [provider]  naproxen sodium (ANAPROX) 220 MG tablet Take 220 mg by mouth 2 (two) times daily with a meal.   Yes [provider]  TURMERIC PO Take by mouth.   Yes [provider]    Family History Family History  Problem Relation Age of Onset  . Hypertension Mother   . Stroke Mother   . Heart attack Father   . Hypertension Sister   . Kidney disease Brother   . Lung cancer Brother   . Heart disease Brother   . Rheum arthritis Maternal Grandmother   . ALS Brother   . Heart attack Brother   . Hypertension Brother   . Lung cancer  Brother     Social History Social History  Substance Use Topics  . Smoking status: Former Research scientist (life sciences)  . Smokeless tobacco: Never Used  . Alcohol use No     Allergies   Amantadines; Fentanyl; Gabapentin; Oxycodone; Phenergan [promethazine hcl]; Vancomycin; Ciprofloxacin; and Augmentin [amoxicillin-pot clavulanate]   Review of Systems Review of Systems  Unable to perform ROS: Dementia  Neurological: Positive for weakness.     Physical Exam Updated Vital Signs BP (!) 174/101 (BP Location: Right Arm)   Pulse 76   Temp 98 F (36.7 C) (Oral)   Resp 18   SpO2 98%   Physical Exam  Constitutional: He appears well-developed and well-nourished.  Chronically ill-appearing  HENT:  Head: Normocephalic and atraumatic.  Dry  mucous membranes  Cardiovascular: Normal rate and regular rhythm.   No murmur heard. Pulmonary/Chest: Effort normal and breath sounds normal. No respiratory distress.  Abdominal: Soft. There is no tenderness. There is no rebound and no guarding.  Musculoskeletal: He exhibits no edema or tenderness.  Neurological: He is alert.  Confused. Follows simple commands. 4+ out of 5 strength in all 4 extremities. Weakness of facial muscles.  Skin: Skin is warm and dry.  Psychiatric: He has a normal mood and affect. His behavior is normal.  Nursing note and vitals reviewed.    ED Treatments / Results  Labs (all labs ordered are listed, but only abnormal results are displayed) Labs Reviewed  COMPREHENSIVE METABOLIC PANEL - Abnormal; Notable for the following:       Result Value   Glucose, Bld 112 (*)    BUN 24 (*)    All other components within normal limits  URINALYSIS, ROUTINE W REFLEX MICROSCOPIC - Abnormal; Notable for the following:    Hgb urine dipstick MODERATE (*)    Squamous Epithelial / LPF 0-5 (*)    All other components within normal limits  CBC WITH DIFFERENTIAL/PLATELET  Randolm Idol, ED    EKG  EKG Interpretation None        Radiology Dg Chest 2 View  Result Date: 11/16/2016 CLINICAL DATA:  Altered mental status. EXAM: CHEST  2 VIEW COMPARISON:  10/12/2016 FINDINGS: Low volume chest with chronic linear opacities at the bases. Partly calcified scar-like opacity noted over the right diaphragm, confirmed on chest CT 10/25/2016. There is chronic asymmetric elevation of the right diaphragm. Normal heart size and stable mediastinal contours. There is no edema, consolidation, effusion, or pneumothorax. Severe glenohumeral osteoarthritis on the right. Left glenohumeral arthroplasty with high prosthetic humeral head. Dorsal column stimulator. IMPRESSION: Scarring/chronic atelectasis at the bases. No acute superimposed finding. Electronically Signed   By: Monte Fantasia M.D.   On: 11/16/2016 11:51   Ct Head Wo Contrast  Result Date: 11/16/2016 CLINICAL DATA:  Altered mental status EXAM: CT HEAD WITHOUT CONTRAST TECHNIQUE: Contiguous axial images were obtained from the base of the skull through the vertex without intravenous contrast. COMPARISON:  05/18/2014 FINDINGS: Brain: No evidence of acute infarction, hemorrhage, extra-axial collection, ventriculomegaly, or mass effect. Generalized cerebral atrophy. Periventricular white matter low attenuation likely secondary to microangiopathy. Vascular: Cerebrovascular atherosclerotic calcifications are noted. Skull: Negative for fracture or focal lesion. Sinuses/Orbits: Visualized portions of the orbits are unremarkable. Visualized portions of the paranasal sinuses and mastoid air cells are unremarkable. Other: None. IMPRESSION: 1. No acute intracranial pathology. 2. Chronic microvascular disease and cerebral atrophy. Electronically Signed   By: Kathreen Devoid   On: 11/16/2016 12:02    Procedures Procedures (including critical care time)  Medications Ordered in ED Medications - No data to display   Initial Impression / Assessment and Plan / ED Course  I have reviewed the triage  vital signs and the nursing notes.  Pertinent labs & imaging results that were available during my care of the patient were reviewed by me and considered in my medical decision making (see chart for details).     Patient with history of dementia here with confusion. Additional history from family state that he has had some cough intermittently over the last few days and his wife reported tactile fever 2 days ago, currently gone. He seems to be sleeping more today and usual but has had progressive decline in his mental status over the last several weeks. The patient has no  complaints in the emergency department and appears to be chronically debilitated.  He is taking po in ED without difficulty.  He is noted to be hypertensive in ED and daughter states that this is typical in ED/hospital setting and resolves when at home.  No evidence of serious bacterial infection, CVA.  Plan to d/c home with increased home health assistance, possible transition to SNF if available.     Final Clinical Impressions(s) / ED Diagnoses   Final diagnoses:  Generalized weakness    New Prescriptions Discharge Medication List as of 11/16/2016  7:14 PM       Quintella Reichert, MD 11/17/16 [id number]

## 2016-11-21 DIAGNOSIS — I10 Essential (primary) hypertension: Secondary | ICD-10-CM | POA: Diagnosis not present

## 2016-11-21 DIAGNOSIS — R1310 Dysphagia, unspecified: Secondary | ICD-10-CM | POA: Diagnosis not present

## 2016-11-21 DIAGNOSIS — M6281 Muscle weakness (generalized): Secondary | ICD-10-CM | POA: Diagnosis not present

## 2016-11-21 DIAGNOSIS — G20 Parkinson's disease: Secondary | ICD-10-CM | POA: Diagnosis not present

## 2016-11-21 DIAGNOSIS — F329 Major depressive disorder, single episode, unspecified: Secondary | ICD-10-CM | POA: Diagnosis not present

## 2016-11-21 DIAGNOSIS — Z8701 Personal history of pneumonia (recurrent): Secondary | ICD-10-CM | POA: Diagnosis not present

## 2016-11-23 DIAGNOSIS — Z8701 Personal history of pneumonia (recurrent): Secondary | ICD-10-CM | POA: Diagnosis not present

## 2016-11-23 DIAGNOSIS — I10 Essential (primary) hypertension: Secondary | ICD-10-CM | POA: Diagnosis not present

## 2016-11-23 DIAGNOSIS — F329 Major depressive disorder, single episode, unspecified: Secondary | ICD-10-CM | POA: Diagnosis not present

## 2016-11-23 DIAGNOSIS — R1310 Dysphagia, unspecified: Secondary | ICD-10-CM | POA: Diagnosis not present

## 2016-11-23 DIAGNOSIS — G20 Parkinson's disease: Secondary | ICD-10-CM | POA: Diagnosis not present

## 2016-11-23 DIAGNOSIS — M6281 Muscle weakness (generalized): Secondary | ICD-10-CM | POA: Diagnosis not present

## 2016-11-29 DIAGNOSIS — I10 Essential (primary) hypertension: Secondary | ICD-10-CM | POA: Diagnosis not present

## 2016-11-29 DIAGNOSIS — M6281 Muscle weakness (generalized): Secondary | ICD-10-CM | POA: Diagnosis not present

## 2016-11-29 DIAGNOSIS — F329 Major depressive disorder, single episode, unspecified: Secondary | ICD-10-CM | POA: Diagnosis not present

## 2016-11-29 DIAGNOSIS — R1310 Dysphagia, unspecified: Secondary | ICD-10-CM | POA: Diagnosis not present

## 2016-11-29 DIAGNOSIS — Z8701 Personal history of pneumonia (recurrent): Secondary | ICD-10-CM | POA: Diagnosis not present

## 2016-11-29 DIAGNOSIS — G20 Parkinson's disease: Secondary | ICD-10-CM | POA: Diagnosis not present

## 2016-11-30 NOTE — Telephone Encounter (Signed)
Patient's wife returned my call from 11/15/16.   She states patient was on Carbidopa Levodopa 25/100 IR 2 tablets three times daily for about a month. He had increased tremors and freezing. She went back to giving him 3 tablets TID (previous dosage). His tremors are better but he is still having freezing. He is still on exelon patches.   Dr. Carles Collet Juluis Rainier.

## 2016-11-30 NOTE — Telephone Encounter (Signed)
That is fine to leave him on it if she thinks that it is helping him.

## 2016-12-01 DIAGNOSIS — R1310 Dysphagia, unspecified: Secondary | ICD-10-CM | POA: Diagnosis not present

## 2016-12-01 DIAGNOSIS — I10 Essential (primary) hypertension: Secondary | ICD-10-CM | POA: Diagnosis not present

## 2016-12-01 DIAGNOSIS — M6281 Muscle weakness (generalized): Secondary | ICD-10-CM | POA: Diagnosis not present

## 2016-12-01 DIAGNOSIS — F329 Major depressive disorder, single episode, unspecified: Secondary | ICD-10-CM | POA: Diagnosis not present

## 2016-12-01 DIAGNOSIS — G20 Parkinson's disease: Secondary | ICD-10-CM | POA: Diagnosis not present

## 2016-12-01 DIAGNOSIS — Z8701 Personal history of pneumonia (recurrent): Secondary | ICD-10-CM | POA: Diagnosis not present

## 2016-12-05 DIAGNOSIS — I10 Essential (primary) hypertension: Secondary | ICD-10-CM | POA: Diagnosis not present

## 2016-12-05 DIAGNOSIS — G20 Parkinson's disease: Secondary | ICD-10-CM | POA: Diagnosis not present

## 2016-12-05 DIAGNOSIS — Z8701 Personal history of pneumonia (recurrent): Secondary | ICD-10-CM | POA: Diagnosis not present

## 2016-12-05 DIAGNOSIS — F329 Major depressive disorder, single episode, unspecified: Secondary | ICD-10-CM | POA: Diagnosis not present

## 2016-12-05 DIAGNOSIS — M6281 Muscle weakness (generalized): Secondary | ICD-10-CM | POA: Diagnosis not present

## 2016-12-05 DIAGNOSIS — R1310 Dysphagia, unspecified: Secondary | ICD-10-CM | POA: Diagnosis not present

## 2016-12-07 ENCOUNTER — Telehealth: Payer: Self-pay | Admitting: Neurology

## 2016-12-07 NOTE — Telephone Encounter (Signed)
VM-PT's wife left a voicemail message asking to speak to Dr Tat or Luvenia Starch in regards to PT"s current condition

## 2016-12-09 NOTE — Telephone Encounter (Signed)
I called patient's wife back and she said that he had gotten to where his legs would not move.  She said that he stayed in bed until 3:00 pm yesterday and is now doing better.  Instructed her to keep an eye on his condition this weekend and if this happens again, she can call back on Monday.

## 2016-12-13 ENCOUNTER — Other Ambulatory Visit: Payer: Self-pay | Admitting: Neurology

## 2016-12-13 MED ORDER — CARBIDOPA-LEVODOPA ER 25-100 MG PO TBCR: 1.0000 | EXTENDED_RELEASE_TABLET | Freq: Every day | ORAL | 1 refills | Status: AC

## 2016-12-20 ENCOUNTER — Ambulatory Visit (INDEPENDENT_AMBULATORY_CARE_PROVIDER_SITE_OTHER): Payer: Medicare Other | Admitting: Family Medicine

## 2016-12-20 ENCOUNTER — Encounter: Payer: Self-pay | Admitting: Family Medicine

## 2016-12-20 ENCOUNTER — Telehealth: Payer: Self-pay | Admitting: Surgical

## 2016-12-20 VITALS — BP 118/70 | HR 73 | Temp 98.6°F

## 2016-12-20 DIAGNOSIS — G20 Parkinson's disease: Secondary | ICD-10-CM | POA: Diagnosis not present

## 2016-12-20 DIAGNOSIS — G3185 Corticobasal degeneration: Secondary | ICD-10-CM

## 2016-12-20 DIAGNOSIS — R1310 Dysphagia, unspecified: Secondary | ICD-10-CM

## 2016-12-20 DIAGNOSIS — R627 Adult failure to thrive: Secondary | ICD-10-CM | POA: Diagnosis not present

## 2016-12-20 DIAGNOSIS — Z022 Encounter for examination for admission to residential institution: Secondary | ICD-10-CM

## 2016-12-20 DIAGNOSIS — I10 Essential (primary) hypertension: Secondary | ICD-10-CM | POA: Diagnosis not present

## 2016-12-20 DIAGNOSIS — G20C Parkinsonism, unspecified: Secondary | ICD-10-CM

## 2016-12-20 NOTE — Progress Notes (Signed)
Paul Callahan is a 83 y.o. male is here to [facility].   Patient Care Team: Briscoe Deutscher, DO as PCP - General (Family Medicine)   History of Present Illness:   [name] CMA acting as scribe for Dr. Juleen China.  HPI: He presents with his wife and caregiver for Nursing Home Placement. Parkinsonism with end-stage dementia and deconditioning. Currently, they have a CMA with them daily but she is leaving. See AP for Problem Based charting.   Health Maintenance Due  Topic Date Due  . TETANUS/TDAP  12/03/1942  . PNA vac Low Risk Adult (2 of 2 - PCV13) 03/21/2011   Immunization History  Administered Date(s) Administered  . Influenza-Unspecified 04/03/2014  . Pneumococcal-Unspecified 03/20/2010   PMHx, SurgHx, SocialHx, Medications, and Allergies were reviewed in the Visit Navigator and updated as appropriate.   Past Medical History:  Diagnosis Date  . Arthritis   . Bowel obstruction ([place]) 12/15/2011  . Dementia   . Depression   . History of colon cancer   . Impaired mobility   . Parkinson disease ([place])   . Sepsis ([place]) 05/20/2014   Past Surgical History:  Procedure Laterality Date  . BACK SURGERY     x 5  . CATARACT EXTRACTION, BILATERAL    . COLON CANCER RESECTION    . EUS N/A 06/18/2014  . FROZEN SHOULDER    . HERNIA REPAIR    . LUMBAR FUSION    . SPINAL CORD STIMULATOR IMPLANT    . TOTAL SHOULDER ARTHROPLASTY Left   . VASECTOMY     Family History  Problem Relation Age of Onset  . Hypertension Mother   . Stroke Mother   . Heart attack Father   . Hypertension Sister   . Kidney disease Brother   . Lung cancer Brother   . Heart disease Brother   . Rheum arthritis Maternal Grandmother   . ALS Brother   . Heart attack Brother   . Hypertension Brother   . Lung cancer Brother    Social History  Substance Use Topics  . Smoking status: Former Research scientist (life sciences)  . Smokeless tobacco: Never Used  . Alcohol use No   Current Medications and Allergies:   .   acetaminophen (TYLENOL) 325 MG tablet, Take 2 tablets (650 mg total) by mouth every 6 (six) hours as needed for mild pain (or Fever >/= 101)., Disp: 60 tablet, Rfl: 0 .  Biotin 3 MG TABS, Take 6 mg by mouth every evening., Disp: , Rfl:  .  carbidopa-levodopa (SINEMET IR) 25-100 MG per tablet, Take 3 tablets by mouth 3 (three) times daily., Disp: , Rfl:  .  Carbidopa-Levodopa ER (SINEMET CR) 25-100 MG tablet controlled release, Take 1 tablet by mouth at bedtime., Disp: 90 tablet, Rfl: 1 .  celecoxib (CELEBREX) 200 MG capsule, Take 200 mg by mouth 2 (two) times daily., Disp: , Rfl:  .  cholecalciferol (VITAMIN D) 1000 UNITS tablet, Take 2,000 Units by mouth daily. , Disp: , Rfl:  .  DULoxetine (CYMBALTA) 60 MG capsule, Take 60 mg by mouth 2 (two) times daily., Disp: , Rfl:  .  EXELON 13.3 MG/24HR PT24, Place 13.3 mg onto the skin daily. , Disp: , Rfl: 11 .  Multiple Vitamin (MULTIVITAMIN) tablet, Take 1 tablet by mouth daily., Disp: , Rfl:  .  naproxen sodium (ANAPROX) 220 MG tablet, Take 220 mg by mouth 2 (two) times daily with a meal., Disp: , Rfl:  .  TURMERIC PO, Take by mouth., Disp: ,  Rfl:   Allergies  Allergen Reactions  . Amantadines Other (See Comments)    Weakness in legs and hallucinations  . Fentanyl Other (See Comments)    Confusion and disorientation   . Gabapentin Other (See Comments)    Weakness in legs and hallucinations  . Oxycodone Other (See Comments)    Confusion and disorientation   . Phenergan [Promethazine Hcl] Other (See Comments)    Confusion and disorientation   . Vancomycin Itching, Rash and Other (See Comments)    Red man's syndrome   . Ciprofloxacin Diarrhea and Rash  . Augmentin [Amoxicillin-Pot Clavulanate]     Has patient had a PCN reaction causing immediate rash, facial/tongue/throat swelling, SOB or lightheadedness with hypotension: no Has patient had a PCN reaction causing severe rash involving mucus membranes or skin necrosis: no Has patient had a PCN  reaction that required hospitalization no Has patient had a PCN reaction occurring within the last 10 years: no If all of the above answers are "NO", then may proceed with Cephalosporin use.   Review of Systems:   Review of Systems  Constitutional: Negative for chills, fever and malaise/fatigue.  HENT: Negative for ear pain, sinus pain and sore throat.   Eyes: Negative for blurred vision and double vision.  Respiratory: Negative for cough, shortness of breath and wheezing.   Cardiovascular: Negative for chest pain and leg swelling.  Gastrointestinal: Negative for abdominal pain, nausea and vomiting.  Musculoskeletal: Negative for back pain, joint pain and neck pain.  Neurological: Negative for dizziness and headaches.  Psychiatric/Behavioral: Positive for memory loss. Negative for depression and hallucinations.   Vitals:   Vitals:   12/20/16 1051  BP: 118/70  Pulse: 73  Temp: 98.6 F (37 C)  TempSrc: Oral  SpO2: 98%     There is no height or weight on file to calculate BMI.  Physical Exam:   Physical Exam  Constitutional: He appears well-developed and well-nourished. No distress.  HENT:  Head: Normocephalic and atraumatic.  Right Ear: External ear normal.  Left Ear: External ear normal.  Nose: Nose normal.  Eyes: Conjunctivae and EOM are normal. Pupils are equal, round, and reactive to light.  Neck: Neck supple. No thyromegaly present.  Cardiovascular: Normal rate, regular rhythm and intact distal pulses.   Pulmonary/Chest: Effort normal and breath sounds normal.  Abdominal: Soft. Bowel sounds are normal.  Lymphadenopathy:    He has no cervical adenopathy.  Neurological: He is alert.  Skin: Skin is warm and dry.  Psychiatric: He has a normal mood and affect. Judgment and thought content normal. His speech is delayed. He is slowed.  Nursing note and vitals reviewed.  Results for orders placed or performed during the hospital encounter of 11/16/16  Comprehensive  metabolic panel  Result Value Ref Range   Sodium 139 135 - 145 mmol/L   Potassium 4.3 3.5 - 5.1 mmol/L   Chloride 102 101 - 111 mmol/L   CO2 29 22 - 32 mmol/L   Glucose, Bld 112 (H) 65 - 99 mg/dL   BUN 24 (H) 6 - 20 mg/dL   Creatinine, Ser 0.91 0.61 - 1.24 mg/dL   Calcium 9.5 8.9 - 10.3 mg/dL   Total Protein 7.6 6.5 - 8.1 g/dL   Albumin 4.4 3.5 - 5.0 g/dL   AST 20 15 - 41 U/L   ALT 18 17 - 63 U/L   Alkaline Phosphatase 68 38 - 126 U/L   Total Bilirubin 0.8 0.3 - 1.2 mg/dL   GFR calc  non Af Amer >60 >60 mL/min   GFR calc Af Amer >60 >60 mL/min   Anion gap 8 5 - 15  CBC with Differential  Result Value Ref Range   WBC 8.1 4.0 - 10.5 K/uL   RBC 4.62 4.22 - 5.81 MIL/uL   Hemoglobin 14.2 13.0 - 17.0 g/dL   HCT 43.5 39.0 - 52.0 %   MCV 94.2 78.0 - 100.0 fL   MCH 30.7 26.0 - 34.0 pg   MCHC 32.6 30.0 - 36.0 g/dL   RDW 14.3 11.5 - 15.5 %   Platelets 200 150 - 400 K/uL   Neutrophils Relative % 71 %   Neutro Abs 5.7 1.7 - 7.7 K/uL   Lymphocytes Relative 22 %   Lymphs Abs 1.7 0.7 - 4.0 K/uL   Monocytes Relative 6 %   Monocytes Absolute 0.5 0.1 - 1.0 K/uL   Eosinophils Relative 1 %   Eosinophils Absolute 0.1 0.0 - 0.7 K/uL   Basophils Relative 0 %   Basophils Absolute 0.0 0.0 - 0.1 K/uL  Urinalysis, Routine w reflex microscopic  Result Value Ref Range   Color, Urine YELLOW YELLOW   APPearance CLEAR CLEAR   Specific Gravity, Urine 1.009 1.005 - 1.030   pH 7.0 5.0 - 8.0   Glucose, UA NEGATIVE NEGATIVE mg/dL   Hgb urine dipstick MODERATE (A) NEGATIVE   Bilirubin Urine NEGATIVE NEGATIVE   Ketones, ur NEGATIVE NEGATIVE mg/dL   Protein, ur NEGATIVE NEGATIVE mg/dL   Nitrite NEGATIVE NEGATIVE   Leukocytes, UA NEGATIVE NEGATIVE   RBC / HPF TOO NUMEROUS TO COUNT 0 - 5 RBC/hpf   WBC, UA 6-30 0 - 5 WBC/hpf   Bacteria, UA NONE SEEN NONE SEEN   Squamous Epithelial / LPF 0-5 (A) NONE SEEN   Mucous PRESENT    Budding Yeast PRESENT    Hyaline Casts, UA PRESENT   I-stat troponin, ED    Result Value Ref Range   Troponin i, poc 0.01 0.00 - 0.08 ng/mL   Comment 3            Assessment and Plan:   Karder was seen today for establish care.  Diagnoses and all orders for this visit:  Encounter for examination for admission to nursing home Comments: Will complete FL2.  Dysphagia, unspecified type Comments: Currently: cut up foods, thickened liquids, gravy "on everything." No recent aspiration.  Parkinsonism, unspecified Parkinsonism type ([place])  Corticobasal degeneration  Failure to thrive in adult  Essential hypertension   . Reviewed expectations re: course of current medical issues. . Discussed self-management of symptoms. . Outlined signs and symptoms indicating need for more acute intervention. . Patient verbalized understanding and all questions were answered. Marland Kitchen Health Maintenance issues including appropriate healthy diet, exercise, and smoking avoidance were discussed with patient. . See orders for this visit as documented in the electronic medical record. . Patient received an After Visit Summary.  CMA served as Education administrator during this visit. History, Physical, and Plan performed by medical provider. The above documentation has been reviewed and is accurate and complete. Briscoe Deutscher, D.O.  Briscoe Deutscher, DO [place] [place] 12/26/2016  Future Appointments Date Time Provider Farmer  01/19/2017 11:15 AM Tat, Eustace Quail, DO [facility] None

## 2016-12-20 NOTE — Telephone Encounter (Signed)
I have called [facility] on the patients behalf to find out the process of admission to the facility. Please transfer to Roselyn Reef if they return call.

## 2016-12-21 NOTE — Telephone Encounter (Signed)
Spoke with Kirstin at [employer]. I am going ot fill out the [facility] form and fax to facility. She is going to call the patients wife to find out more details about the financial part of it. One I have faxed the form she will contact me with any other questions.

## 2016-12-26 ENCOUNTER — Encounter: Payer: Self-pay | Admitting: Family Medicine

## 2016-12-26 DIAGNOSIS — H353132 Nonexudative age-related macular degeneration, bilateral, intermediate dry stage: Secondary | ICD-10-CM | POA: Diagnosis not present

## 2016-12-26 DIAGNOSIS — H01001 Unspecified blepharitis right upper eyelid: Secondary | ICD-10-CM | POA: Diagnosis not present

## 2016-12-26 DIAGNOSIS — H04123 Dry eye syndrome of bilateral lacrimal glands: Secondary | ICD-10-CM | POA: Diagnosis not present

## 2016-12-26 DIAGNOSIS — H52203 Unspecified astigmatism, bilateral: Secondary | ICD-10-CM | POA: Diagnosis not present

## 2016-12-29 DIAGNOSIS — R1312 Dysphagia, oropharyngeal phase: Secondary | ICD-10-CM | POA: Diagnosis not present

## 2016-12-29 DIAGNOSIS — R41841 Cognitive communication deficit: Secondary | ICD-10-CM | POA: Diagnosis not present

## 2016-12-29 DIAGNOSIS — R278 Other lack of coordination: Secondary | ICD-10-CM | POA: Diagnosis not present

## 2016-12-29 DIAGNOSIS — G20 Parkinson's disease: Secondary | ICD-10-CM | POA: Diagnosis not present

## 2016-12-30 DIAGNOSIS — R41841 Cognitive communication deficit: Secondary | ICD-10-CM | POA: Diagnosis not present

## 2016-12-30 DIAGNOSIS — R1312 Dysphagia, oropharyngeal phase: Secondary | ICD-10-CM | POA: Diagnosis not present

## 2016-12-30 DIAGNOSIS — G20 Parkinson's disease: Secondary | ICD-10-CM | POA: Diagnosis not present

## 2016-12-30 DIAGNOSIS — R278 Other lack of coordination: Secondary | ICD-10-CM | POA: Diagnosis not present

## 2016-12-30 DIAGNOSIS — F0391 Unspecified dementia with behavioral disturbance: Secondary | ICD-10-CM | POA: Diagnosis not present

## 2017-01-02 DIAGNOSIS — R1312 Dysphagia, oropharyngeal phase: Secondary | ICD-10-CM | POA: Diagnosis not present

## 2017-01-02 DIAGNOSIS — M6281 Muscle weakness (generalized): Secondary | ICD-10-CM | POA: Diagnosis not present

## 2017-01-02 DIAGNOSIS — R278 Other lack of coordination: Secondary | ICD-10-CM | POA: Diagnosis not present

## 2017-01-03 DIAGNOSIS — M6281 Muscle weakness (generalized): Secondary | ICD-10-CM | POA: Diagnosis not present

## 2017-01-03 DIAGNOSIS — R5381 Other malaise: Secondary | ICD-10-CM | POA: Diagnosis not present

## 2017-01-03 DIAGNOSIS — R1312 Dysphagia, oropharyngeal phase: Secondary | ICD-10-CM | POA: Diagnosis not present

## 2017-01-03 DIAGNOSIS — R278 Other lack of coordination: Secondary | ICD-10-CM | POA: Diagnosis not present

## 2017-01-03 DIAGNOSIS — G20 Parkinson's disease: Secondary | ICD-10-CM | POA: Diagnosis not present

## 2017-01-03 DIAGNOSIS — F0391 Unspecified dementia with behavioral disturbance: Secondary | ICD-10-CM | POA: Diagnosis not present

## 2017-01-04 DIAGNOSIS — R278 Other lack of coordination: Secondary | ICD-10-CM | POA: Diagnosis not present

## 2017-01-04 DIAGNOSIS — M6281 Muscle weakness (generalized): Secondary | ICD-10-CM | POA: Diagnosis not present

## 2017-01-04 DIAGNOSIS — R1312 Dysphagia, oropharyngeal phase: Secondary | ICD-10-CM | POA: Diagnosis not present

## 2017-01-05 DIAGNOSIS — R278 Other lack of coordination: Secondary | ICD-10-CM | POA: Diagnosis not present

## 2017-01-05 DIAGNOSIS — R1312 Dysphagia, oropharyngeal phase: Secondary | ICD-10-CM | POA: Diagnosis not present

## 2017-01-05 DIAGNOSIS — M6281 Muscle weakness (generalized): Secondary | ICD-10-CM | POA: Diagnosis not present

## 2017-01-06 DIAGNOSIS — M6281 Muscle weakness (generalized): Secondary | ICD-10-CM | POA: Diagnosis not present

## 2017-01-06 DIAGNOSIS — R278 Other lack of coordination: Secondary | ICD-10-CM | POA: Diagnosis not present

## 2017-01-06 DIAGNOSIS — R1312 Dysphagia, oropharyngeal phase: Secondary | ICD-10-CM | POA: Diagnosis not present

## 2017-01-09 DIAGNOSIS — F0391 Unspecified dementia with behavioral disturbance: Secondary | ICD-10-CM | POA: Diagnosis not present

## 2017-01-09 DIAGNOSIS — G20 Parkinson's disease: Secondary | ICD-10-CM | POA: Diagnosis not present

## 2017-01-09 DIAGNOSIS — R1312 Dysphagia, oropharyngeal phase: Secondary | ICD-10-CM | POA: Diagnosis not present

## 2017-01-09 DIAGNOSIS — M6281 Muscle weakness (generalized): Secondary | ICD-10-CM | POA: Diagnosis not present

## 2017-01-09 DIAGNOSIS — R278 Other lack of coordination: Secondary | ICD-10-CM | POA: Diagnosis not present

## 2017-01-10 DIAGNOSIS — R1312 Dysphagia, oropharyngeal phase: Secondary | ICD-10-CM | POA: Diagnosis not present

## 2017-01-10 DIAGNOSIS — R278 Other lack of coordination: Secondary | ICD-10-CM | POA: Diagnosis not present

## 2017-01-10 DIAGNOSIS — M6281 Muscle weakness (generalized): Secondary | ICD-10-CM | POA: Diagnosis not present

## 2017-01-11 DIAGNOSIS — M6281 Muscle weakness (generalized): Secondary | ICD-10-CM | POA: Diagnosis not present

## 2017-01-11 DIAGNOSIS — R261 Paralytic gait: Secondary | ICD-10-CM | POA: Diagnosis not present

## 2017-01-11 DIAGNOSIS — R41841 Cognitive communication deficit: Secondary | ICD-10-CM | POA: Diagnosis not present

## 2017-01-11 DIAGNOSIS — I10 Essential (primary) hypertension: Secondary | ICD-10-CM | POA: Diagnosis not present

## 2017-01-11 DIAGNOSIS — R1312 Dysphagia, oropharyngeal phase: Secondary | ICD-10-CM | POA: Diagnosis not present

## 2017-01-11 DIAGNOSIS — G20 Parkinson's disease: Secondary | ICD-10-CM | POA: Diagnosis not present

## 2017-01-12 DIAGNOSIS — I10 Essential (primary) hypertension: Secondary | ICD-10-CM | POA: Diagnosis not present

## 2017-01-12 DIAGNOSIS — R1312 Dysphagia, oropharyngeal phase: Secondary | ICD-10-CM | POA: Diagnosis not present

## 2017-01-12 DIAGNOSIS — M6281 Muscle weakness (generalized): Secondary | ICD-10-CM | POA: Diagnosis not present

## 2017-01-12 DIAGNOSIS — G20 Parkinson's disease: Secondary | ICD-10-CM | POA: Diagnosis not present

## 2017-01-12 DIAGNOSIS — R41841 Cognitive communication deficit: Secondary | ICD-10-CM | POA: Diagnosis not present

## 2017-01-12 DIAGNOSIS — R261 Paralytic gait: Secondary | ICD-10-CM | POA: Diagnosis not present

## 2017-01-12 NOTE — Telephone Encounter (Signed)
Verbal order given  

## 2017-01-12 NOTE — Telephone Encounter (Signed)
Pete, PT 2x a week for 6 weeks.  Verbal order needed.  Thank you,  -LL

## 2017-01-13 DIAGNOSIS — R1312 Dysphagia, oropharyngeal phase: Secondary | ICD-10-CM | POA: Diagnosis not present

## 2017-01-13 DIAGNOSIS — R41841 Cognitive communication deficit: Secondary | ICD-10-CM | POA: Diagnosis not present

## 2017-01-13 DIAGNOSIS — G20 Parkinson's disease: Secondary | ICD-10-CM | POA: Diagnosis not present

## 2017-01-13 DIAGNOSIS — R261 Paralytic gait: Secondary | ICD-10-CM | POA: Diagnosis not present

## 2017-01-13 DIAGNOSIS — M6281 Muscle weakness (generalized): Secondary | ICD-10-CM | POA: Diagnosis not present

## 2017-01-13 DIAGNOSIS — I10 Essential (primary) hypertension: Secondary | ICD-10-CM | POA: Diagnosis not present

## 2017-01-16 DIAGNOSIS — R261 Paralytic gait: Secondary | ICD-10-CM | POA: Diagnosis not present

## 2017-01-16 DIAGNOSIS — I10 Essential (primary) hypertension: Secondary | ICD-10-CM | POA: Diagnosis not present

## 2017-01-16 DIAGNOSIS — G20 Parkinson's disease: Secondary | ICD-10-CM | POA: Diagnosis not present

## 2017-01-16 DIAGNOSIS — R41841 Cognitive communication deficit: Secondary | ICD-10-CM | POA: Diagnosis not present

## 2017-01-16 DIAGNOSIS — M6281 Muscle weakness (generalized): Secondary | ICD-10-CM | POA: Diagnosis not present

## 2017-01-16 DIAGNOSIS — R1312 Dysphagia, oropharyngeal phase: Secondary | ICD-10-CM | POA: Diagnosis not present

## 2017-01-17 DIAGNOSIS — R41841 Cognitive communication deficit: Secondary | ICD-10-CM | POA: Diagnosis not present

## 2017-01-17 DIAGNOSIS — G20 Parkinson's disease: Secondary | ICD-10-CM | POA: Diagnosis not present

## 2017-01-17 DIAGNOSIS — I10 Essential (primary) hypertension: Secondary | ICD-10-CM | POA: Diagnosis not present

## 2017-01-17 DIAGNOSIS — R261 Paralytic gait: Secondary | ICD-10-CM | POA: Diagnosis not present

## 2017-01-17 DIAGNOSIS — R1312 Dysphagia, oropharyngeal phase: Secondary | ICD-10-CM | POA: Diagnosis not present

## 2017-01-17 DIAGNOSIS — M6281 Muscle weakness (generalized): Secondary | ICD-10-CM | POA: Diagnosis not present

## 2017-01-17 NOTE — Progress Notes (Signed)
Paul Callahan was seen today in the movement disorders clinic for neurologic consultation at the request of Briscoe Deutscher, DO.  The consultation is for the evaluation of PD.   Pt accompanied by wife and a caregiver who supply most of history.   Pt previously under the care of [name] and I have reviewed his records.  Before that, he saw [name] at [facility].  Started seeing [name] in March, 2013.  He reported having difficulty with gait after a back surgery in 2009.  He was referred to see [name] and ultimately was placed on levodopa, which she did not think was a tremendous value.  He was placed on amantadine, but that caused further falls.  Upon transferring to [name] in March, 2013, he thought his presentation was atypical for idiopathic Parkinson's disease and perhaps had Lewy body dementia or vascular parkinsonism or one of the atypical states.  DaT scan was done in 2014 and demonstrated likely abnormal image grade 1, given patient positioning limitations: Asymmetric uptake with normal or almost normal putamen activity in one hemisphere and with a more marked reduction in the contralateral putamen. patient was last seen by [name] on 09/24/2015.  With the course of time, 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  The patient is now on Exelon patch.  Still on carbidopa/levodopa 25/100, 3 po tid (10:30am/2:30pm/6:30pm) and carbidopa/levodopa 25/100 CR qhs.  Wife asks about potentially decreasing this.  She does state that when he was in a nursing facility in 2016, she asked then to discontinue it and he had an emergence of tremor so it was restarted.     Specific Symptoms:  Tremor: Yes.  , just occasionally (not sure if one or both hands).  Noted rarely with eating rather than rest tremor.   Voice: doesn't verbally talk much with family Sleep: sleeps in hospital bed in same room as wife.  Melatonin gummies, 5mg , 1/2 q hs has  helped, but seemed to be making him a little too sleepy and wife asks me about this today.  She thinks that is why he is overly sleepy today. Postural symptoms:  Yes.  , currently participating in home health physical therapy and that has greatly helped.  Falls?  Rare because someone always with him and if falls the fall is assisted. Urinary Incontinence:  Does well during day but wife cannot get him up at night and so uses diapers.  Caregivers present for 7 days a week since 2016 and they are there 13 hours in the daytime but not there at night. Trouble with ADL's:  Yes.   (he is assisted with dressing, bathing but does brush own teeth, able to feed himself)  Trouble buttoning clothing: Yes.   Memory changes:  Yes.   (wife states that he was dx with dementia perhaps in 2010; wife asks about potentially discontinuing his Exelon) Hallucinations:  Yes.  (mostly when first wakes up in the AM and will see horses, etc out the window.  They are not scary or disturbing)  01/19/17 update:  Patient returns today for follow-up, accompanied by his wife as well as a caregiver who provide most of the history.  I have also reviewed records that are available.  Last visit, his wife wanted to try to see if decreasing levodopa would help.  He tried to decrease his daytime levodopa from 3 tablets 3 times per day to 2 tablets 3  times per day.  However, when his wife tried this, she noticed more tremor and more difficulty walking.  She ended up going back up to carbidopa/levodopa 25/100, 3 tablets 3 times per day in addition to his carbidopa/levodopa 25/100 CR at bedtime.  He is also on the Exelon patch.  Patient wife couldn't take care of him so put him in SNF but he was only there for 12 days and wife didn't think this was best place for him so she brought him home and she has care 13 hours/day (daytime).  She has a hospital bed for the patient.     ALLERGIES:   Allergies  Allergen Reactions  . Amantadines Other (See  Comments)    Weakness in legs and hallucinations  . Fentanyl Other (See Comments)    Confusion and disorientation   . Gabapentin Other (See Comments)    Weakness in legs and hallucinations  . Oxycodone Other (See Comments)    Confusion and disorientation   . Phenergan [Promethazine Hcl] Other (See Comments)    Confusion and disorientation   . Vancomycin Itching, Rash and Other (See Comments)    Red man's syndrome   . Ciprofloxacin Diarrhea and Rash  . Augmentin [Amoxicillin-Pot Clavulanate]     Has patient had a PCN reaction causing immediate rash, facial/tongue/throat swelling, SOB or lightheadedness with hypotension: no Has patient had a PCN reaction causing severe rash involving mucus membranes or skin necrosis: no Has patient had a PCN reaction that required hospitalization no Has patient had a PCN reaction occurring within the last 10 years: no If all of the above answers are "NO", then may proceed with Cephalosporin use.    CURRENT MEDICATIONS:  Outpatient Encounter Prescriptions as of 01/19/2017  Medication Sig  . acetaminophen (TYLENOL) 325 MG tablet Take 2 tablets (650 mg total) by mouth every 6 (six) hours as needed for mild pain (or Fever >/= 101).  . Biotin 3 MG TABS Take 6 mg by mouth every evening.  . carbidopa-levodopa (SINEMET IR) 25-100 MG per tablet Take 3 tablets by mouth 3 (three) times daily.  . Carbidopa-Levodopa ER (SINEMET CR) 25-100 MG tablet controlled release Take 1 tablet by mouth at bedtime.  . celecoxib (CELEBREX) 200 MG capsule Take 200 mg by mouth 2 (two) times daily.  . cholecalciferol (VITAMIN D) 1000 UNITS tablet Take 2,000 Units by mouth daily.   . DULoxetine (CYMBALTA) 60 MG capsule Take 60 mg by mouth 2 (two) times daily.  . EXELON 13.3 MG/24HR PT24 Place 13.3 mg onto the skin daily.   . Multiple Vitamin (MULTIVITAMIN) tablet Take 1 tablet by mouth daily.  . naproxen sodium (ANAPROX) 220 MG tablet Take 220 mg by mouth 2 (two) times daily with a  meal.  . TURMERIC PO Take by mouth.   No facility-administered encounter medications on file as of 01/19/2017.     PAST MEDICAL HISTORY:   Past Medical History:  Diagnosis Date  . Arthritis   . Bowel obstruction ([place]) 12/15/2011  . Dementia   . Depression   . History of colon cancer   . Impaired mobility   . Parkinson disease ([place])   . Sepsis ([place]) 05/20/2014    PAST SURGICAL HISTORY:   Past Surgical History:  Procedure Laterality Date  . BACK SURGERY     x 5  . CATARACT EXTRACTION, BILATERAL    . COLON CANCER RESECTION    . EUS N/A 06/18/2014  . FROZEN SHOULDER    . HERNIA REPAIR    .  LUMBAR FUSION    . SPINAL CORD STIMULATOR IMPLANT    . TOTAL SHOULDER ARTHROPLASTY Left   . VASECTOMY      SOCIAL HISTORY:   Social History   Social History  . Marital status: Married    Spouse name: N/A  . Number of children: 3  . Years of education: N/A   Occupational History  .  Retired   Social History Main Topics  . Smoking status: Former Research scientist (life sciences)  . Smokeless tobacco: Never Used  . Alcohol use No  . Drug use: No  . Sexual activity: No   Other Topics Concern  . Not on file   Social History Narrative  . No narrative on file    FAMILY HISTORY:   Family Status  Relation Status  . Mother Deceased  . Father Deceased  . Sister Alive  . Brother Alive  . MGM Deceased  . MGF Deceased  . PGM Deceased  . PGF Deceased  . Brother Deceased  . Brother Deceased  . Brother Deceased    ROS:  A complete 10 system review of systems was obtained and was unremarkable apart from what is mentioned above.  PHYSICAL EXAMINATION:    VITALS:   Vitals:   01/19/17 1059  BP: 116/60  Pulse: 74  SpO2: 95%    GEN:  The patient appears stated age and is in NAD.  Pt is very somnolent and sleeps much of the visit.  He is arousable and will follow simple commands.   HEENT:  Normocephalic, atraumatic.  The mucous membranes are moist. The superficial temporal arteries are without  ropiness or tenderness. CV:  RRR Lungs:  CTAB Neck/HEME:  There are no carotid bruits bilaterally.  Neurological examination:  Orientation:  Montreal Cognitive Assessment  10/04/2016  Visuospatial/ Executive (0/5) 0  Naming (0/3) 2  Attention: Read list of digits (0/2) 2  Attention: Read list of letters (0/1) 0  Attention: Serial 7 subtraction starting at 100 (0/3) 0  Language: Repeat phrase (0/2) 1  Language : Fluency (0/1) 0  Abstraction (0/2) 0  Delayed Recall (0/5) 0  Orientation (0/6) 0  Total 5  Adjusted Score (based on education) 5   Cranial nerves: There is good facial symmetry. He has facial hypomimia.  He is more conversant today and his speech is fluent and clear. Soft palate rises symmetrically and there is no tongue deviation. Hearing appears to be somewhat decreased to conversational tone. Sensation: Sensation is intact to light touch throughout Motor: Strength is at least antigravity x 4. Deep tendon reflexes: Deep tendon reflexes are 1/4 at the bilateral biceps, triceps, brachioradialis, patella and absent at the bilateral achilles. Plantar responses are downgoing bilaterally.  Movement examination: Tone: There is slightly increased in both upper extremities, right greater than left. Some gegenhalten noted Abnormal movements: rare RUE resting tremor Coordination:   He is slow with all RAMs but not necessarily decremation.  Foot taps are slower on the right than the left. Gait and Station: The patient was 2 person max assist to stand. Cannot ambulate.  ASSESSMENT/PLAN:  1.  Parkinsonism  -Sounds like the patient began to have a problem in 2009.  His DaT scan in 2014 was weakly positive, stating "likely abnormal image grade 1."  [name] felt that this was most likely cortical basal ganglionic degeneration, although the symmetry of findings would make this somewhat unusual.  CT of the brain was done in 2016 demonstrating white matter disease.  Exact type of parkinsonian  syndrome is likely irrelevant at this time given level of dementia.     - Attempts at decreasing the patient's levodopa were unsuccessful.  He will remain on carbidopa/levodopa 25/100 3 po tid and carbidopa/levodopa 25/100 CR q hs.   2.  Severe dementia  -Wife not sure that exelon patch helpful but at this point, not sure that it is helpful to try and rock the boat to d/c.  -wife tried to put patient in SNF but only in for 12 days and wife brought back home to take care of in home.  Wife has 13 hour/day caregiving  -talked to wife/pt/caregiver about activites to do with patient in the home to keep him active.  3.  Insomnia  -Patient did well with melatonin and was previously having day/night reversal.  However, he seems to be having a hangover effect even if he gets 2.5 mg of melatonin and is sleeping more during the day.  Told his wife to try to find the 1 mg gummies or sublingual melatonin.  4.   F/u 6-8 months.  Much greater than 50% of this visit was spent in counseling and coordinating care.  Total face to face time:  35 min  Cc:  Briscoe Deutscher, DO

## 2017-01-18 DIAGNOSIS — R1312 Dysphagia, oropharyngeal phase: Secondary | ICD-10-CM | POA: Diagnosis not present

## 2017-01-18 DIAGNOSIS — R261 Paralytic gait: Secondary | ICD-10-CM | POA: Diagnosis not present

## 2017-01-18 DIAGNOSIS — I10 Essential (primary) hypertension: Secondary | ICD-10-CM | POA: Diagnosis not present

## 2017-01-18 DIAGNOSIS — R41841 Cognitive communication deficit: Secondary | ICD-10-CM | POA: Diagnosis not present

## 2017-01-18 DIAGNOSIS — M6281 Muscle weakness (generalized): Secondary | ICD-10-CM | POA: Diagnosis not present

## 2017-01-18 DIAGNOSIS — G20 Parkinson's disease: Secondary | ICD-10-CM | POA: Diagnosis not present

## 2017-01-19 ENCOUNTER — Encounter: Payer: Self-pay | Admitting: Neurology

## 2017-01-19 ENCOUNTER — Ambulatory Visit (INDEPENDENT_AMBULATORY_CARE_PROVIDER_SITE_OTHER): Payer: Medicare Other | Admitting: Neurology

## 2017-01-19 VITALS — BP 116/60 | HR 74

## 2017-01-19 DIAGNOSIS — F0280 Dementia in other diseases classified elsewhere without behavioral disturbance: Secondary | ICD-10-CM | POA: Diagnosis not present

## 2017-01-19 DIAGNOSIS — G20 Parkinson's disease: Secondary | ICD-10-CM | POA: Diagnosis not present

## 2017-01-23 DIAGNOSIS — G20 Parkinson's disease: Secondary | ICD-10-CM | POA: Diagnosis not present

## 2017-01-23 DIAGNOSIS — I10 Essential (primary) hypertension: Secondary | ICD-10-CM | POA: Diagnosis not present

## 2017-01-23 DIAGNOSIS — R41841 Cognitive communication deficit: Secondary | ICD-10-CM | POA: Diagnosis not present

## 2017-01-23 DIAGNOSIS — M6281 Muscle weakness (generalized): Secondary | ICD-10-CM | POA: Diagnosis not present

## 2017-01-23 DIAGNOSIS — R1312 Dysphagia, oropharyngeal phase: Secondary | ICD-10-CM | POA: Diagnosis not present

## 2017-01-23 DIAGNOSIS — R261 Paralytic gait: Secondary | ICD-10-CM | POA: Diagnosis not present

## 2017-01-24 DIAGNOSIS — I788 Other diseases of capillaries: Secondary | ICD-10-CM | POA: Diagnosis not present

## 2017-01-24 DIAGNOSIS — Z85828 Personal history of other malignant neoplasm of skin: Secondary | ICD-10-CM | POA: Diagnosis not present

## 2017-01-24 DIAGNOSIS — L738 Other specified follicular disorders: Secondary | ICD-10-CM | POA: Diagnosis not present

## 2017-01-25 DIAGNOSIS — R1312 Dysphagia, oropharyngeal phase: Secondary | ICD-10-CM | POA: Diagnosis not present

## 2017-01-25 DIAGNOSIS — R41841 Cognitive communication deficit: Secondary | ICD-10-CM | POA: Diagnosis not present

## 2017-01-25 DIAGNOSIS — I10 Essential (primary) hypertension: Secondary | ICD-10-CM | POA: Diagnosis not present

## 2017-01-25 DIAGNOSIS — R261 Paralytic gait: Secondary | ICD-10-CM | POA: Diagnosis not present

## 2017-01-25 DIAGNOSIS — G20 Parkinson's disease: Secondary | ICD-10-CM | POA: Diagnosis not present

## 2017-01-25 DIAGNOSIS — M6281 Muscle weakness (generalized): Secondary | ICD-10-CM | POA: Diagnosis not present

## 2017-01-26 DIAGNOSIS — M6281 Muscle weakness (generalized): Secondary | ICD-10-CM | POA: Diagnosis not present

## 2017-01-26 DIAGNOSIS — R41841 Cognitive communication deficit: Secondary | ICD-10-CM | POA: Diagnosis not present

## 2017-01-26 DIAGNOSIS — R1312 Dysphagia, oropharyngeal phase: Secondary | ICD-10-CM | POA: Diagnosis not present

## 2017-01-26 DIAGNOSIS — I10 Essential (primary) hypertension: Secondary | ICD-10-CM | POA: Diagnosis not present

## 2017-01-26 DIAGNOSIS — G20 Parkinson's disease: Secondary | ICD-10-CM | POA: Diagnosis not present

## 2017-01-26 DIAGNOSIS — R261 Paralytic gait: Secondary | ICD-10-CM | POA: Diagnosis not present

## 2017-01-27 DIAGNOSIS — R1312 Dysphagia, oropharyngeal phase: Secondary | ICD-10-CM | POA: Diagnosis not present

## 2017-01-27 DIAGNOSIS — M6281 Muscle weakness (generalized): Secondary | ICD-10-CM | POA: Diagnosis not present

## 2017-01-27 DIAGNOSIS — R41841 Cognitive communication deficit: Secondary | ICD-10-CM | POA: Diagnosis not present

## 2017-01-27 DIAGNOSIS — I10 Essential (primary) hypertension: Secondary | ICD-10-CM | POA: Diagnosis not present

## 2017-01-27 DIAGNOSIS — G20 Parkinson's disease: Secondary | ICD-10-CM | POA: Diagnosis not present

## 2017-01-27 DIAGNOSIS — R261 Paralytic gait: Secondary | ICD-10-CM | POA: Diagnosis not present

## 2017-01-30 DIAGNOSIS — R1312 Dysphagia, oropharyngeal phase: Secondary | ICD-10-CM | POA: Diagnosis not present

## 2017-01-30 DIAGNOSIS — M6281 Muscle weakness (generalized): Secondary | ICD-10-CM | POA: Diagnosis not present

## 2017-01-30 DIAGNOSIS — G20 Parkinson's disease: Secondary | ICD-10-CM | POA: Diagnosis not present

## 2017-01-30 DIAGNOSIS — R261 Paralytic gait: Secondary | ICD-10-CM | POA: Diagnosis not present

## 2017-01-30 DIAGNOSIS — R41841 Cognitive communication deficit: Secondary | ICD-10-CM | POA: Diagnosis not present

## 2017-01-30 DIAGNOSIS — I10 Essential (primary) hypertension: Secondary | ICD-10-CM | POA: Diagnosis not present

## 2017-01-31 DIAGNOSIS — R41841 Cognitive communication deficit: Secondary | ICD-10-CM | POA: Diagnosis not present

## 2017-01-31 DIAGNOSIS — G20 Parkinson's disease: Secondary | ICD-10-CM | POA: Diagnosis not present

## 2017-01-31 DIAGNOSIS — R261 Paralytic gait: Secondary | ICD-10-CM | POA: Diagnosis not present

## 2017-01-31 DIAGNOSIS — M6281 Muscle weakness (generalized): Secondary | ICD-10-CM | POA: Diagnosis not present

## 2017-01-31 DIAGNOSIS — I10 Essential (primary) hypertension: Secondary | ICD-10-CM | POA: Diagnosis not present

## 2017-01-31 DIAGNOSIS — R1312 Dysphagia, oropharyngeal phase: Secondary | ICD-10-CM | POA: Diagnosis not present

## 2017-02-01 ENCOUNTER — Telehealth: Payer: Self-pay | Admitting: Family Medicine

## 2017-02-01 DIAGNOSIS — I10 Essential (primary) hypertension: Secondary | ICD-10-CM | POA: Diagnosis not present

## 2017-02-01 DIAGNOSIS — R261 Paralytic gait: Secondary | ICD-10-CM | POA: Diagnosis not present

## 2017-02-01 DIAGNOSIS — G20 Parkinson's disease: Secondary | ICD-10-CM | POA: Diagnosis not present

## 2017-02-01 DIAGNOSIS — M6281 Muscle weakness (generalized): Secondary | ICD-10-CM | POA: Diagnosis not present

## 2017-02-01 DIAGNOSIS — R41841 Cognitive communication deficit: Secondary | ICD-10-CM | POA: Diagnosis not present

## 2017-02-01 DIAGNOSIS — R1312 Dysphagia, oropharyngeal phase: Secondary | ICD-10-CM | POA: Diagnosis not present

## 2017-02-01 NOTE — Telephone Encounter (Signed)
Spoke with Remo Lipps and gave verbal orders.

## 2017-02-01 NOTE — Telephone Encounter (Signed)
Remo Lipps from [facility] called to get orders for OT for 1x a week for 1 week and 2x a week for 5 weeks.  This was adjusted due to the time since initial request (3 weeks ago) to address patient's ADL independence, strength and minimize fall risk. Call to advise, okay to leave a detailed message on phone.

## 2017-02-03 DIAGNOSIS — R41841 Cognitive communication deficit: Secondary | ICD-10-CM | POA: Diagnosis not present

## 2017-02-03 DIAGNOSIS — R1312 Dysphagia, oropharyngeal phase: Secondary | ICD-10-CM | POA: Diagnosis not present

## 2017-02-03 DIAGNOSIS — G20 Parkinson's disease: Secondary | ICD-10-CM | POA: Diagnosis not present

## 2017-02-03 DIAGNOSIS — M6281 Muscle weakness (generalized): Secondary | ICD-10-CM | POA: Diagnosis not present

## 2017-02-03 DIAGNOSIS — I10 Essential (primary) hypertension: Secondary | ICD-10-CM | POA: Diagnosis not present

## 2017-02-03 DIAGNOSIS — R261 Paralytic gait: Secondary | ICD-10-CM | POA: Diagnosis not present

## 2017-02-06 DIAGNOSIS — R41841 Cognitive communication deficit: Secondary | ICD-10-CM | POA: Diagnosis not present

## 2017-02-06 DIAGNOSIS — G20 Parkinson's disease: Secondary | ICD-10-CM | POA: Diagnosis not present

## 2017-02-06 DIAGNOSIS — R1312 Dysphagia, oropharyngeal phase: Secondary | ICD-10-CM | POA: Diagnosis not present

## 2017-02-06 DIAGNOSIS — R261 Paralytic gait: Secondary | ICD-10-CM | POA: Diagnosis not present

## 2017-02-06 DIAGNOSIS — M6281 Muscle weakness (generalized): Secondary | ICD-10-CM | POA: Diagnosis not present

## 2017-02-06 DIAGNOSIS — I10 Essential (primary) hypertension: Secondary | ICD-10-CM | POA: Diagnosis not present

## 2017-02-07 DIAGNOSIS — R1312 Dysphagia, oropharyngeal phase: Secondary | ICD-10-CM | POA: Diagnosis not present

## 2017-02-07 DIAGNOSIS — I10 Essential (primary) hypertension: Secondary | ICD-10-CM | POA: Diagnosis not present

## 2017-02-07 DIAGNOSIS — G20 Parkinson's disease: Secondary | ICD-10-CM | POA: Diagnosis not present

## 2017-02-07 DIAGNOSIS — R41841 Cognitive communication deficit: Secondary | ICD-10-CM | POA: Diagnosis not present

## 2017-02-07 DIAGNOSIS — R261 Paralytic gait: Secondary | ICD-10-CM | POA: Diagnosis not present

## 2017-02-07 DIAGNOSIS — M6281 Muscle weakness (generalized): Secondary | ICD-10-CM | POA: Diagnosis not present

## 2017-02-08 DIAGNOSIS — G20 Parkinson's disease: Secondary | ICD-10-CM | POA: Diagnosis not present

## 2017-02-08 DIAGNOSIS — R41841 Cognitive communication deficit: Secondary | ICD-10-CM | POA: Diagnosis not present

## 2017-02-08 DIAGNOSIS — R261 Paralytic gait: Secondary | ICD-10-CM | POA: Diagnosis not present

## 2017-02-08 DIAGNOSIS — M6281 Muscle weakness (generalized): Secondary | ICD-10-CM | POA: Diagnosis not present

## 2017-02-08 DIAGNOSIS — I10 Essential (primary) hypertension: Secondary | ICD-10-CM | POA: Diagnosis not present

## 2017-02-08 DIAGNOSIS — R1312 Dysphagia, oropharyngeal phase: Secondary | ICD-10-CM | POA: Diagnosis not present

## 2017-02-09 ENCOUNTER — Telehealth: Payer: Self-pay | Admitting: Family Medicine

## 2017-02-09 DIAGNOSIS — M6281 Muscle weakness (generalized): Secondary | ICD-10-CM | POA: Diagnosis not present

## 2017-02-09 DIAGNOSIS — R1312 Dysphagia, oropharyngeal phase: Secondary | ICD-10-CM | POA: Diagnosis not present

## 2017-02-09 DIAGNOSIS — G20 Parkinson's disease: Secondary | ICD-10-CM | POA: Diagnosis not present

## 2017-02-09 DIAGNOSIS — I10 Essential (primary) hypertension: Secondary | ICD-10-CM | POA: Diagnosis not present

## 2017-02-09 DIAGNOSIS — R261 Paralytic gait: Secondary | ICD-10-CM | POA: Diagnosis not present

## 2017-02-09 DIAGNOSIS — R41841 Cognitive communication deficit: Secondary | ICD-10-CM | POA: Diagnosis not present

## 2017-02-09 NOTE — Telephone Encounter (Signed)
Amber from [facility] called to get verification if Dr. Juleen China would be the attending physician of care for the patient while in hospice. Please call the number provided as soon as possible so that hospice can update the records.

## 2017-02-10 DIAGNOSIS — R1312 Dysphagia, oropharyngeal phase: Secondary | ICD-10-CM | POA: Diagnosis not present

## 2017-02-10 DIAGNOSIS — M6281 Muscle weakness (generalized): Secondary | ICD-10-CM | POA: Diagnosis not present

## 2017-02-10 DIAGNOSIS — R261 Paralytic gait: Secondary | ICD-10-CM | POA: Diagnosis not present

## 2017-02-10 DIAGNOSIS — G20 Parkinson's disease: Secondary | ICD-10-CM | POA: Diagnosis not present

## 2017-02-10 DIAGNOSIS — R41841 Cognitive communication deficit: Secondary | ICD-10-CM | POA: Diagnosis not present

## 2017-02-10 DIAGNOSIS — I10 Essential (primary) hypertension: Secondary | ICD-10-CM | POA: Diagnosis not present

## 2017-02-10 NOTE — Telephone Encounter (Signed)
Please advise 

## 2017-02-10 NOTE — Telephone Encounter (Signed)
Spoke with Museum/gallery conservator at [employer] and

## 2017-02-10 NOTE — Telephone Encounter (Signed)
Amber called back to advise that she is following up. They have a nurse scheduled tentatively to go out to the home this afternoon, but cannot go without your say-so. She asks for a response asap.

## 2017-02-10 NOTE — Telephone Encounter (Signed)
Spoke with Museum/gallery conservator at [employer]. Gave verbal for the nurse to go out to evaluate to see if he is eligible for Hospice care. They will send [place] a form if needed to sign.

## 2017-02-10 NOTE — Telephone Encounter (Signed)
Okayed.

## 2017-02-13 DIAGNOSIS — F0280 Dementia in other diseases classified elsewhere without behavioral disturbance: Secondary | ICD-10-CM | POA: Diagnosis not present

## 2017-02-13 DIAGNOSIS — G20 Parkinson's disease: Secondary | ICD-10-CM | POA: Diagnosis not present

## 2017-02-13 DIAGNOSIS — F339 Major depressive disorder, recurrent, unspecified: Secondary | ICD-10-CM | POA: Diagnosis not present

## 2017-02-13 DIAGNOSIS — I10 Essential (primary) hypertension: Secondary | ICD-10-CM | POA: Diagnosis not present

## 2017-02-13 DIAGNOSIS — H3530 Unspecified macular degeneration: Secondary | ICD-10-CM | POA: Diagnosis not present

## 2017-02-14 DIAGNOSIS — I10 Essential (primary) hypertension: Secondary | ICD-10-CM | POA: Diagnosis not present

## 2017-02-14 DIAGNOSIS — F0280 Dementia in other diseases classified elsewhere without behavioral disturbance: Secondary | ICD-10-CM | POA: Diagnosis not present

## 2017-02-14 DIAGNOSIS — F339 Major depressive disorder, recurrent, unspecified: Secondary | ICD-10-CM | POA: Diagnosis not present

## 2017-02-14 DIAGNOSIS — H3530 Unspecified macular degeneration: Secondary | ICD-10-CM | POA: Diagnosis not present

## 2017-02-14 DIAGNOSIS — G20 Parkinson's disease: Secondary | ICD-10-CM | POA: Diagnosis not present

## 2017-02-15 ENCOUNTER — Telehealth: Payer: Self-pay | Admitting: Family Medicine

## 2017-02-15 NOTE — Telephone Encounter (Signed)
MEDICATION: DULoxetine (CYMBALTA) 60 MG capsule                           celecoxib (CELEBREX) 200 MG capsule PHARMACY:    [facility], [place] [phone] (Phone) [phone] (Fax)    IS THIS A 90 DAY SUPPLY : Celebrex is 90  IS PATIENT OUT OF MEDICTAION: Y out of both  IF NOT; HOW MUCH IS LEFT:   LAST APPOINTMENT DATE: 12/20/16  NEXT APPOINTMENT DATE: N/A  OTHER COMMENTS:    **Let patient know to contact pharmacy at the end of the day to make sure medication is ready. **  ** Please notify patient to allow 48-72 hours to process**  **Encourage patient to contact the pharmacy for refills or they can request refills through [facility]**

## 2017-02-15 NOTE — Telephone Encounter (Signed)
Are you ok to fill these prescriptions?

## 2017-02-16 DIAGNOSIS — F339 Major depressive disorder, recurrent, unspecified: Secondary | ICD-10-CM | POA: Diagnosis not present

## 2017-02-16 DIAGNOSIS — F0280 Dementia in other diseases classified elsewhere without behavioral disturbance: Secondary | ICD-10-CM | POA: Diagnosis not present

## 2017-02-16 DIAGNOSIS — I10 Essential (primary) hypertension: Secondary | ICD-10-CM | POA: Diagnosis not present

## 2017-02-16 DIAGNOSIS — G20 Parkinson's disease: Secondary | ICD-10-CM | POA: Diagnosis not present

## 2017-02-16 DIAGNOSIS — H3530 Unspecified macular degeneration: Secondary | ICD-10-CM | POA: Diagnosis not present

## 2017-02-16 MED ORDER — CELECOXIB 200 MG PO CAPS: 200.0000 mg | ORAL_CAPSULE | Freq: Two times a day (BID) | ORAL | 1 refills | Status: AC

## 2017-02-16 MED ORDER — DULOXETINE HCL 60 MG PO CPEP: 60.0000 mg | ORAL_CAPSULE | Freq: Two times a day (BID) | ORAL | 1 refills | Status: AC

## 2017-02-16 NOTE — Telephone Encounter (Signed)
Yes, refill. Make sure he has an appt at some point.

## 2017-02-16 NOTE — Telephone Encounter (Signed)
Both RX have been sent to the pharmacy and patient's wife notified. I have scheduled him to come in next Tuesday for an appointment.

## 2017-02-16 NOTE — Addendum Note (Signed)
Addended by: Durwin Glaze on: 02/16/2017 11:07 AM   Modules accepted: Orders

## 2017-02-21 DIAGNOSIS — H3530 Unspecified macular degeneration: Secondary | ICD-10-CM | POA: Diagnosis not present

## 2017-02-21 DIAGNOSIS — F339 Major depressive disorder, recurrent, unspecified: Secondary | ICD-10-CM | POA: Diagnosis not present

## 2017-02-21 DIAGNOSIS — F0280 Dementia in other diseases classified elsewhere without behavioral disturbance: Secondary | ICD-10-CM | POA: Diagnosis not present

## 2017-02-21 DIAGNOSIS — G20 Parkinson's disease: Secondary | ICD-10-CM | POA: Diagnosis not present

## 2017-02-21 DIAGNOSIS — I10 Essential (primary) hypertension: Secondary | ICD-10-CM | POA: Diagnosis not present

## 2017-02-22 ENCOUNTER — Ambulatory Visit (INDEPENDENT_AMBULATORY_CARE_PROVIDER_SITE_OTHER): Admitting: Family Medicine

## 2017-02-22 ENCOUNTER — Encounter: Payer: Self-pay | Admitting: Family Medicine

## 2017-02-22 VITALS — BP 122/74 | HR 83 | Temp 98.4°F

## 2017-02-22 DIAGNOSIS — F0280 Dementia in other diseases classified elsewhere without behavioral disturbance: Secondary | ICD-10-CM

## 2017-02-22 DIAGNOSIS — R059 Cough, unspecified: Secondary | ICD-10-CM

## 2017-02-22 DIAGNOSIS — R05 Cough: Secondary | ICD-10-CM | POA: Diagnosis not present

## 2017-02-22 DIAGNOSIS — G20A1 Parkinson's disease without dyskinesia, without mention of fluctuations: Secondary | ICD-10-CM

## 2017-02-22 DIAGNOSIS — G20 Parkinson's disease: Secondary | ICD-10-CM | POA: Diagnosis not present

## 2017-02-22 DIAGNOSIS — K869 Disease of pancreas, unspecified: Secondary | ICD-10-CM | POA: Diagnosis not present

## 2017-02-22 DIAGNOSIS — Z515 Encounter for palliative care: Secondary | ICD-10-CM | POA: Diagnosis not present

## 2017-02-22 DIAGNOSIS — H6123 Impacted cerumen, bilateral: Secondary | ICD-10-CM | POA: Diagnosis not present

## 2017-02-22 MED ORDER — AZITHROMYCIN 250 MG PO TABS: ORAL_TABLET | ORAL | 0 refills | Status: AC

## 2017-02-22 MED ORDER — AZITHROMYCIN 250 MG PO TABS: ORAL_TABLET | ORAL | 0 refills | Status: DC

## 2017-02-22 NOTE — Progress Notes (Signed)
Paul Callahan is a 83 y.o. male is here for follow up.  History of Present Illness:   Water quality scientist, CMA, acting as scribe for Dr. Juleen China.  HPI:   1. Cough. For one week. Stable. No wheezing, complaints of CP, or overt sputum production. Mild rhinitis.    2. Bilateral impacted cerumen. Wife and caregiver ask for lavage today.   5. Encounter for hospice care. Paperwork completed. End stage dementia, pancreatic lesion.   Health Maintenance Due  Topic Date Due  . TETANUS/TDAP  12/03/1942  . PNA vac Low Risk Adult (2 of 2 - PCV13) 11/28/2015  . INFLUENZA VACCINE  02/01/2017   PMHx, SurgHx, SocialHx, FamHx, Medications, and Allergies were reviewed in the Visit Navigator and updated as appropriate.   Patient Active Problem List   Diagnosis Date Noted  . Dysphagia 08/18/2014  . Essential hypertension 08/18/2014  . Failure to thrive in adult   . Atelectasis 08/17/2014  . Pancreatic lesion 08/17/2014  . Pancreatic mass 05/21/2014  . Transaminitis 05/18/2014  . Dementia without behavioral disturbance   . 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 Abnormal nuclear stress test in 2009 - subsequent cath revealed normal coronaries w/ EF >60%, no WMA 09/28/2012  . Parkinsonism ([place]) 09/27/2012  . Physical deconditioning 12/22/2011   Social History  Substance Use Topics  . Smoking status: Former Research scientist (life sciences)  . Smokeless tobacco: Never Used  . Alcohol use No   Current Medications and Allergies:   Current Outpatient Prescriptions:  .  acetaminophen (TYLENOL) 325 MG tablet, Take 2 tablets (650 mg total) by mouth every 6 (six) hours as needed for mild pain (or Fever >/= 101)., Disp: 60 tablet, Rfl: 0 .  Biotin 3 MG TABS, Take 6 mg by mouth every evening., Disp: , Rfl:  .  carbidopa-levodopa (SINEMET IR) 25-100 MG per tablet, Take 3 tablets by mouth 3 (three) times daily., Disp: , Rfl:  .  Carbidopa-Levodopa ER (SINEMET CR) 25-100 MG tablet controlled release, Take 1 tablet by mouth at  bedtime., Disp: 90 tablet, Rfl: 1 .  celecoxib (CELEBREX) 200 MG capsule, Take 1 capsule (200 mg total) by mouth 2 (two) times daily., Disp: 60 capsule, Rfl: 1 .  cholecalciferol (VITAMIN D) 1000 UNITS tablet, Take 2,000 Units by mouth daily. , Disp: , Rfl:  .  DULoxetine (CYMBALTA) 60 MG capsule, Take 1 capsule (60 mg total) by mouth 2 (two) times daily., Disp: 60 capsule, Rfl: 1 .  EXELON 13.3 MG/24HR PT24, Place 13.3 mg onto the skin daily. , Disp: , Rfl: 11 .  Multiple Vitamin (MULTIVITAMIN) tablet, Take 1 tablet by mouth daily., Disp: , Rfl:  .  naproxen sodium (ANAPROX) 220 MG tablet, Take 220 mg by mouth 2 (two) times daily with a meal., Disp: , Rfl:  .  TURMERIC PO, Take by mouth., Disp: , Rfl:  .  azithromycin (ZITHROMAX) 250 MG tablet, Two po day 1 and then one po each day until gone., Disp: 6 tablet, Rfl: 0   Allergies  Allergen Reactions  . Amantadines Other (See Comments)    Weakness in legs and hallucinations  . Fentanyl Other (See Comments)    Confusion and disorientation   . Gabapentin Other (See Comments)    Weakness in legs and hallucinations  . Oxycodone Other (See Comments)    Confusion and disorientation   . Phenergan [Promethazine Hcl] Other (See Comments)    Confusion and disorientation   . Vancomycin Itching, Rash and Other (See Comments)    Red  man's syndrome   . Ciprofloxacin Diarrhea and Rash  . Augmentin [Amoxicillin-Pot Clavulanate]     Has patient had a PCN reaction causing immediate rash, facial/tongue/throat swelling, SOB or lightheadedness with hypotension: no Has patient had a PCN reaction causing severe rash involving mucus membranes or skin necrosis: no Has patient had a PCN reaction that required hospitalization no Has patient had a PCN reaction occurring within the last 10 years: no If all of the above answers are "NO", then may proceed with Cephalosporin use.   Review of Systems   Pertinent items are noted in the HPI. Otherwise, ROS is  negative.  Vitals:   Vitals:   02/22/17 1125  BP: 122/74  Pulse: 83  Temp: 98.4 F (36.9 C)  TempSrc: Oral  SpO2: 95%     There is no height or weight on file to calculate BMI. Physical Exam:   Physical Exam  Constitutional: He appears well-developed and well-nourished.  In wheelchair, ignores me most of the time.  HENT:  Head: Normocephalic and atraumatic.  Cardiovascular: Normal rate and regular rhythm.   Pulmonary/Chest: Effort normal. No respiratory distress. He has no wheezes.  Central lung congestion, cough.  Abdominal: Soft.  Nursing note and vitals reviewed.   Assessment and Plan:   [name] was seen today for follow-up.  Diagnoses and all orders for this visit:  Cough Comments: With high aspiration risk. Okay safety net Rx Zpak.  Orders: -     azithromycin (ZITHROMAX) 250 MG tablet; Two po day 1 and then one po each day until gone.  Bilateral impacted cerumen Comments: Lavage in usual manner without complications and resolution of impaction.  Dementia due to Parkinson's disease without behavioral disturbance [facility])  Pancreatic lesion  Encounter for hospice care Comments: Paperwork completed.  . Reviewed expectations re: course of current medical issues. . Discussed self-management of symptoms. . Outlined signs and symptoms indicating need for more acute intervention. . Patient verbalized understanding and all questions were answered. Marland Kitchen Health Maintenance issues including appropriate healthy diet, exercise, and smoking avoidance were discussed with patient. . See orders for this visit as documented in the electronic medical record. . Patient received an After Visit Summary.  CMA served as Education administrator during this visit. History, Physical, and Plan performed by medical provider. The above documentation has been reviewed and is accurate and complete. Briscoe Deutscher, D.O.  Briscoe Deutscher, DO [place] [place] 02/26/2017  Future Appointments Date Time  Provider [place]  07/20/2017 11:15 AM Tat, Eustace Quail, DO [facility] None

## 2017-02-26 DIAGNOSIS — Z515 Encounter for palliative care: Secondary | ICD-10-CM | POA: Insufficient documentation

## 2017-02-28 DIAGNOSIS — I10 Essential (primary) hypertension: Secondary | ICD-10-CM | POA: Diagnosis not present

## 2017-02-28 DIAGNOSIS — F339 Major depressive disorder, recurrent, unspecified: Secondary | ICD-10-CM | POA: Diagnosis not present

## 2017-02-28 DIAGNOSIS — F0280 Dementia in other diseases classified elsewhere without behavioral disturbance: Secondary | ICD-10-CM | POA: Diagnosis not present

## 2017-02-28 DIAGNOSIS — G20 Parkinson's disease: Secondary | ICD-10-CM | POA: Diagnosis not present

## 2017-02-28 DIAGNOSIS — H3530 Unspecified macular degeneration: Secondary | ICD-10-CM | POA: Diagnosis not present

## 2017-03-02 DIAGNOSIS — F339 Major depressive disorder, recurrent, unspecified: Secondary | ICD-10-CM | POA: Diagnosis not present

## 2017-03-02 DIAGNOSIS — G20 Parkinson's disease: Secondary | ICD-10-CM | POA: Diagnosis not present

## 2017-03-02 DIAGNOSIS — I10 Essential (primary) hypertension: Secondary | ICD-10-CM | POA: Diagnosis not present

## 2017-03-02 DIAGNOSIS — F0280 Dementia in other diseases classified elsewhere without behavioral disturbance: Secondary | ICD-10-CM | POA: Diagnosis not present

## 2017-03-02 DIAGNOSIS — H3530 Unspecified macular degeneration: Secondary | ICD-10-CM | POA: Diagnosis not present

## 2017-03-03 DIAGNOSIS — F339 Major depressive disorder, recurrent, unspecified: Secondary | ICD-10-CM | POA: Diagnosis not present

## 2017-03-03 DIAGNOSIS — F0280 Dementia in other diseases classified elsewhere without behavioral disturbance: Secondary | ICD-10-CM | POA: Diagnosis not present

## 2017-03-03 DIAGNOSIS — H3530 Unspecified macular degeneration: Secondary | ICD-10-CM | POA: Diagnosis not present

## 2017-03-03 DIAGNOSIS — G20 Parkinson's disease: Secondary | ICD-10-CM | POA: Diagnosis not present

## 2017-03-03 DIAGNOSIS — I10 Essential (primary) hypertension: Secondary | ICD-10-CM | POA: Diagnosis not present

## 2017-03-04 DIAGNOSIS — G20 Parkinson's disease: Secondary | ICD-10-CM | POA: Diagnosis not present

## 2017-03-04 DIAGNOSIS — F339 Major depressive disorder, recurrent, unspecified: Secondary | ICD-10-CM | POA: Diagnosis not present

## 2017-03-04 DIAGNOSIS — I10 Essential (primary) hypertension: Secondary | ICD-10-CM | POA: Diagnosis not present

## 2017-03-04 DIAGNOSIS — H3530 Unspecified macular degeneration: Secondary | ICD-10-CM | POA: Diagnosis not present

## 2017-03-04 DIAGNOSIS — F0280 Dementia in other diseases classified elsewhere without behavioral disturbance: Secondary | ICD-10-CM | POA: Diagnosis not present

## 2017-03-09 DIAGNOSIS — I10 Essential (primary) hypertension: Secondary | ICD-10-CM | POA: Diagnosis not present

## 2017-03-09 DIAGNOSIS — H3530 Unspecified macular degeneration: Secondary | ICD-10-CM | POA: Diagnosis not present

## 2017-03-09 DIAGNOSIS — F0280 Dementia in other diseases classified elsewhere without behavioral disturbance: Secondary | ICD-10-CM | POA: Diagnosis not present

## 2017-03-09 DIAGNOSIS — F339 Major depressive disorder, recurrent, unspecified: Secondary | ICD-10-CM | POA: Diagnosis not present

## 2017-03-09 DIAGNOSIS — G20 Parkinson's disease: Secondary | ICD-10-CM | POA: Diagnosis not present

## 2017-03-16 DIAGNOSIS — H3530 Unspecified macular degeneration: Secondary | ICD-10-CM | POA: Diagnosis not present

## 2017-03-16 DIAGNOSIS — I10 Essential (primary) hypertension: Secondary | ICD-10-CM | POA: Diagnosis not present

## 2017-03-16 DIAGNOSIS — F0280 Dementia in other diseases classified elsewhere without behavioral disturbance: Secondary | ICD-10-CM | POA: Diagnosis not present

## 2017-03-16 DIAGNOSIS — G20 Parkinson's disease: Secondary | ICD-10-CM | POA: Diagnosis not present

## 2017-03-16 DIAGNOSIS — F339 Major depressive disorder, recurrent, unspecified: Secondary | ICD-10-CM | POA: Diagnosis not present

## 2017-03-23 DIAGNOSIS — H3530 Unspecified macular degeneration: Secondary | ICD-10-CM | POA: Diagnosis not present

## 2017-03-23 DIAGNOSIS — F339 Major depressive disorder, recurrent, unspecified: Secondary | ICD-10-CM | POA: Diagnosis not present

## 2017-03-23 DIAGNOSIS — G20 Parkinson's disease: Secondary | ICD-10-CM | POA: Diagnosis not present

## 2017-03-23 DIAGNOSIS — F0280 Dementia in other diseases classified elsewhere without behavioral disturbance: Secondary | ICD-10-CM | POA: Diagnosis not present

## 2017-03-23 DIAGNOSIS — I10 Essential (primary) hypertension: Secondary | ICD-10-CM | POA: Diagnosis not present

## 2017-03-29 ENCOUNTER — Telehealth: Payer: Self-pay | Admitting: Family Medicine

## 2017-03-29 DIAGNOSIS — F0280 Dementia in other diseases classified elsewhere without behavioral disturbance: Secondary | ICD-10-CM | POA: Diagnosis not present

## 2017-03-29 DIAGNOSIS — G20 Parkinson's disease: Secondary | ICD-10-CM | POA: Diagnosis not present

## 2017-03-29 DIAGNOSIS — I10 Essential (primary) hypertension: Secondary | ICD-10-CM | POA: Diagnosis not present

## 2017-03-29 DIAGNOSIS — F339 Major depressive disorder, recurrent, unspecified: Secondary | ICD-10-CM | POA: Diagnosis not present

## 2017-03-29 DIAGNOSIS — H3530 Unspecified macular degeneration: Secondary | ICD-10-CM | POA: Diagnosis not present

## 2017-03-29 NOTE — Telephone Encounter (Signed)
Patient has high BP. Lindsey,Nurse.   2 weeks ago 146/82, last week 160/100, today 150/92.  Stable otherwise.  Ty,  -LL

## 2017-03-30 NOTE — Telephone Encounter (Signed)
FYI

## 2017-03-31 DIAGNOSIS — I10 Essential (primary) hypertension: Secondary | ICD-10-CM | POA: Diagnosis not present

## 2017-03-31 DIAGNOSIS — H3530 Unspecified macular degeneration: Secondary | ICD-10-CM | POA: Diagnosis not present

## 2017-03-31 DIAGNOSIS — G20 Parkinson's disease: Secondary | ICD-10-CM | POA: Diagnosis not present

## 2017-03-31 DIAGNOSIS — F339 Major depressive disorder, recurrent, unspecified: Secondary | ICD-10-CM | POA: Diagnosis not present

## 2017-03-31 DIAGNOSIS — F0280 Dementia in other diseases classified elsewhere without behavioral disturbance: Secondary | ICD-10-CM | POA: Diagnosis not present

## 2017-04-03 DIAGNOSIS — H3530 Unspecified macular degeneration: Secondary | ICD-10-CM | POA: Diagnosis not present

## 2017-04-03 DIAGNOSIS — F339 Major depressive disorder, recurrent, unspecified: Secondary | ICD-10-CM | POA: Diagnosis not present

## 2017-04-03 DIAGNOSIS — I10 Essential (primary) hypertension: Secondary | ICD-10-CM | POA: Diagnosis not present

## 2017-04-03 DIAGNOSIS — F0280 Dementia in other diseases classified elsewhere without behavioral disturbance: Secondary | ICD-10-CM | POA: Diagnosis not present

## 2017-04-03 DIAGNOSIS — G20 Parkinson's disease: Secondary | ICD-10-CM | POA: Diagnosis not present

## 2017-04-06 DIAGNOSIS — G20 Parkinson's disease: Secondary | ICD-10-CM | POA: Diagnosis not present

## 2017-04-06 DIAGNOSIS — H3530 Unspecified macular degeneration: Secondary | ICD-10-CM | POA: Diagnosis not present

## 2017-04-06 DIAGNOSIS — I10 Essential (primary) hypertension: Secondary | ICD-10-CM | POA: Diagnosis not present

## 2017-04-06 DIAGNOSIS — F0280 Dementia in other diseases classified elsewhere without behavioral disturbance: Secondary | ICD-10-CM | POA: Diagnosis not present

## 2017-04-06 DIAGNOSIS — F339 Major depressive disorder, recurrent, unspecified: Secondary | ICD-10-CM | POA: Diagnosis not present

## 2017-04-08 DIAGNOSIS — H3530 Unspecified macular degeneration: Secondary | ICD-10-CM | POA: Diagnosis not present

## 2017-04-08 DIAGNOSIS — I10 Essential (primary) hypertension: Secondary | ICD-10-CM | POA: Diagnosis not present

## 2017-04-08 DIAGNOSIS — G20 Parkinson's disease: Secondary | ICD-10-CM | POA: Diagnosis not present

## 2017-04-08 DIAGNOSIS — F339 Major depressive disorder, recurrent, unspecified: Secondary | ICD-10-CM | POA: Diagnosis not present

## 2017-04-08 DIAGNOSIS — F0280 Dementia in other diseases classified elsewhere without behavioral disturbance: Secondary | ICD-10-CM | POA: Diagnosis not present

## 2017-04-10 DIAGNOSIS — G20 Parkinson's disease: Secondary | ICD-10-CM | POA: Diagnosis not present

## 2017-04-10 DIAGNOSIS — F0280 Dementia in other diseases classified elsewhere without behavioral disturbance: Secondary | ICD-10-CM | POA: Diagnosis not present

## 2017-04-10 DIAGNOSIS — I10 Essential (primary) hypertension: Secondary | ICD-10-CM | POA: Diagnosis not present

## 2017-04-10 DIAGNOSIS — H3530 Unspecified macular degeneration: Secondary | ICD-10-CM | POA: Diagnosis not present

## 2017-04-10 DIAGNOSIS — F339 Major depressive disorder, recurrent, unspecified: Secondary | ICD-10-CM | POA: Diagnosis not present

## 2017-04-11 DIAGNOSIS — F0280 Dementia in other diseases classified elsewhere without behavioral disturbance: Secondary | ICD-10-CM | POA: Diagnosis not present

## 2017-04-11 DIAGNOSIS — H3530 Unspecified macular degeneration: Secondary | ICD-10-CM | POA: Diagnosis not present

## 2017-04-11 DIAGNOSIS — F339 Major depressive disorder, recurrent, unspecified: Secondary | ICD-10-CM | POA: Diagnosis not present

## 2017-04-11 DIAGNOSIS — G20 Parkinson's disease: Secondary | ICD-10-CM | POA: Diagnosis not present

## 2017-04-11 DIAGNOSIS — I10 Essential (primary) hypertension: Secondary | ICD-10-CM | POA: Diagnosis not present

## 2017-04-13 DIAGNOSIS — F0280 Dementia in other diseases classified elsewhere without behavioral disturbance: Secondary | ICD-10-CM | POA: Diagnosis not present

## 2017-04-13 DIAGNOSIS — G20 Parkinson's disease: Secondary | ICD-10-CM | POA: Diagnosis not present

## 2017-04-13 DIAGNOSIS — F339 Major depressive disorder, recurrent, unspecified: Secondary | ICD-10-CM | POA: Diagnosis not present

## 2017-04-13 DIAGNOSIS — H3530 Unspecified macular degeneration: Secondary | ICD-10-CM | POA: Diagnosis not present

## 2017-04-13 DIAGNOSIS — I10 Essential (primary) hypertension: Secondary | ICD-10-CM | POA: Diagnosis not present

## 2017-04-14 DIAGNOSIS — F339 Major depressive disorder, recurrent, unspecified: Secondary | ICD-10-CM | POA: Diagnosis not present

## 2017-04-14 DIAGNOSIS — I10 Essential (primary) hypertension: Secondary | ICD-10-CM | POA: Diagnosis not present

## 2017-04-14 DIAGNOSIS — H3530 Unspecified macular degeneration: Secondary | ICD-10-CM | POA: Diagnosis not present

## 2017-04-14 DIAGNOSIS — F0280 Dementia in other diseases classified elsewhere without behavioral disturbance: Secondary | ICD-10-CM | POA: Diagnosis not present

## 2017-04-14 DIAGNOSIS — G20 Parkinson's disease: Secondary | ICD-10-CM | POA: Diagnosis not present

## 2017-04-15 DIAGNOSIS — F0280 Dementia in other diseases classified elsewhere without behavioral disturbance: Secondary | ICD-10-CM | POA: Diagnosis not present

## 2017-04-15 DIAGNOSIS — F339 Major depressive disorder, recurrent, unspecified: Secondary | ICD-10-CM | POA: Diagnosis not present

## 2017-04-15 DIAGNOSIS — I10 Essential (primary) hypertension: Secondary | ICD-10-CM | POA: Diagnosis not present

## 2017-04-15 DIAGNOSIS — H3530 Unspecified macular degeneration: Secondary | ICD-10-CM | POA: Diagnosis not present

## 2017-04-15 DIAGNOSIS — G20 Parkinson's disease: Secondary | ICD-10-CM | POA: Diagnosis not present

## 2017-04-18 DIAGNOSIS — I10 Essential (primary) hypertension: Secondary | ICD-10-CM | POA: Diagnosis not present

## 2017-04-18 DIAGNOSIS — F339 Major depressive disorder, recurrent, unspecified: Secondary | ICD-10-CM | POA: Diagnosis not present

## 2017-04-18 DIAGNOSIS — G20 Parkinson's disease: Secondary | ICD-10-CM | POA: Diagnosis not present

## 2017-04-18 DIAGNOSIS — F0280 Dementia in other diseases classified elsewhere without behavioral disturbance: Secondary | ICD-10-CM | POA: Diagnosis not present

## 2017-04-18 DIAGNOSIS — H3530 Unspecified macular degeneration: Secondary | ICD-10-CM | POA: Diagnosis not present

## 2017-04-20 DIAGNOSIS — I10 Essential (primary) hypertension: Secondary | ICD-10-CM | POA: Diagnosis not present

## 2017-04-20 DIAGNOSIS — H3530 Unspecified macular degeneration: Secondary | ICD-10-CM | POA: Diagnosis not present

## 2017-04-20 DIAGNOSIS — F0280 Dementia in other diseases classified elsewhere without behavioral disturbance: Secondary | ICD-10-CM | POA: Diagnosis not present

## 2017-04-20 DIAGNOSIS — F339 Major depressive disorder, recurrent, unspecified: Secondary | ICD-10-CM | POA: Diagnosis not present

## 2017-04-20 DIAGNOSIS — G20 Parkinson's disease: Secondary | ICD-10-CM | POA: Diagnosis not present

## 2017-04-21 DIAGNOSIS — I10 Essential (primary) hypertension: Secondary | ICD-10-CM | POA: Diagnosis not present

## 2017-04-21 DIAGNOSIS — G20 Parkinson's disease: Secondary | ICD-10-CM | POA: Diagnosis not present

## 2017-04-21 DIAGNOSIS — H3530 Unspecified macular degeneration: Secondary | ICD-10-CM | POA: Diagnosis not present

## 2017-04-21 DIAGNOSIS — F339 Major depressive disorder, recurrent, unspecified: Secondary | ICD-10-CM | POA: Diagnosis not present

## 2017-04-21 DIAGNOSIS — F0280 Dementia in other diseases classified elsewhere without behavioral disturbance: Secondary | ICD-10-CM | POA: Diagnosis not present

## 2017-04-23 DIAGNOSIS — F0280 Dementia in other diseases classified elsewhere without behavioral disturbance: Secondary | ICD-10-CM | POA: Diagnosis not present

## 2017-04-23 DIAGNOSIS — H3530 Unspecified macular degeneration: Secondary | ICD-10-CM | POA: Diagnosis not present

## 2017-04-23 DIAGNOSIS — F339 Major depressive disorder, recurrent, unspecified: Secondary | ICD-10-CM | POA: Diagnosis not present

## 2017-04-23 DIAGNOSIS — I10 Essential (primary) hypertension: Secondary | ICD-10-CM | POA: Diagnosis not present

## 2017-04-23 DIAGNOSIS — G20 Parkinson's disease: Secondary | ICD-10-CM | POA: Diagnosis not present

## 2017-04-24 DIAGNOSIS — G20 Parkinson's disease: Secondary | ICD-10-CM | POA: Diagnosis not present

## 2017-04-24 DIAGNOSIS — I10 Essential (primary) hypertension: Secondary | ICD-10-CM | POA: Diagnosis not present

## 2017-04-24 DIAGNOSIS — F0280 Dementia in other diseases classified elsewhere without behavioral disturbance: Secondary | ICD-10-CM | POA: Diagnosis not present

## 2017-04-24 DIAGNOSIS — H3530 Unspecified macular degeneration: Secondary | ICD-10-CM | POA: Diagnosis not present

## 2017-04-24 DIAGNOSIS — F339 Major depressive disorder, recurrent, unspecified: Secondary | ICD-10-CM | POA: Diagnosis not present

## 2017-04-27 ENCOUNTER — Telehealth: Payer: Self-pay

## 2017-04-27 NOTE — Telephone Encounter (Signed)
Funeral home called in reference to death certificate for patient. Patient is getting cremated and funeral needs death certificate signed ASAP. Please advise.

## 2017-04-27 NOTE — Telephone Encounter (Signed)
I have requested death note from hospice.  I have also informed the funeral company that our office is waiting for cause of death before Dr. Juleen China can sign the death certificate.

## 2017-04-28 ENCOUNTER — Telehealth: Payer: Self-pay

## 2017-04-28 NOTE — Telephone Encounter (Signed)
Death certificate for patient has been filled out and signed by Dr. Juleen China and faxed back to funeral service.

## 2017-04-28 NOTE — Telephone Encounter (Signed)
Funeral Home called to check for updates on death certificate. Funeral Home needs as soon as possible to expedite cremation. Please advise.

## 2017-04-28 NOTE — Telephone Encounter (Signed)
Death certificate has been faxed to funeral home.

## 2017-05-04 DEATH — deceased

## 2017-05-05 ENCOUNTER — Telehealth: Payer: Self-pay

## 2017-05-05 NOTE — Telephone Encounter (Signed)
Received official copy of death certificate for patient.  It was filled out, signed, and mailed to [facility].  Copy was also sent to scan.

## 2017-07-20 ENCOUNTER — Ambulatory Visit: Payer: Medicare Other | Admitting: Neurology

## 2017-12-28 IMAGING — CT CT HEAD W/O CM
4 of 5 series · 15 of 47 positions shown, 17 images · non-contrast
Comparison: 05/18/2014

CLINICAL DATA: Altered mental status

EXAM:
CT HEAD WITHOUT CONTRAST
TECHNIQUE: Contiguous axial images were obtained from the base of the skull
through the vertex without intravenous contrast.

[Series 2: head w/o · axial · non-contrast · 0.45mm/px · z∈[-179,-79]mm · 6 of 29 slices shown, 8 images]
[im 5/29  brain]
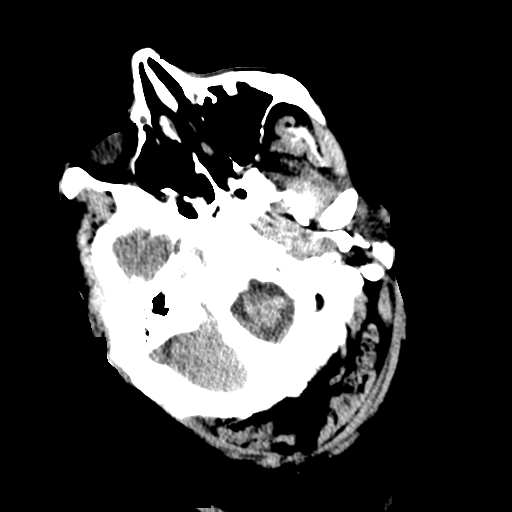
[im 5/29  bone]
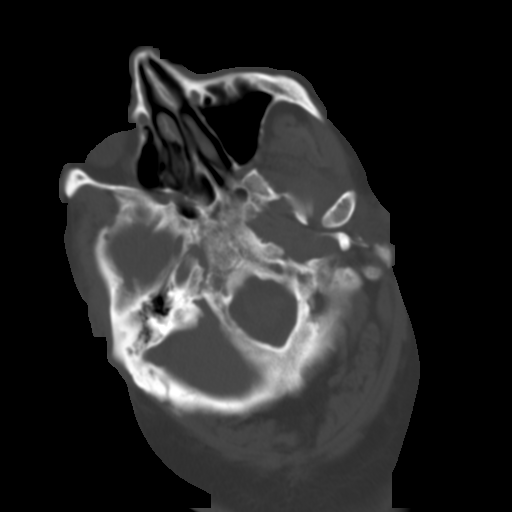
[im 9/29  brain]
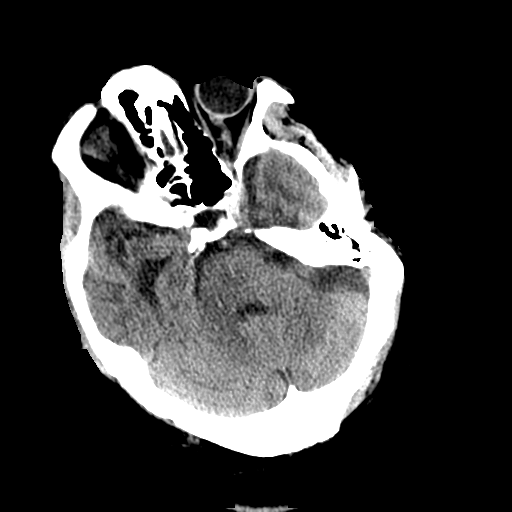
[im 13/29  brain]
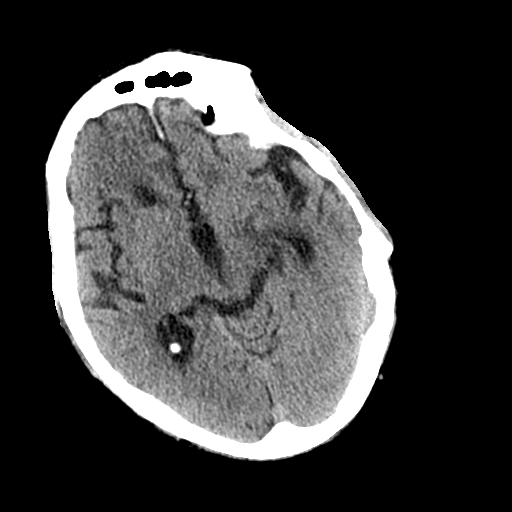
[im 17/29  brain]
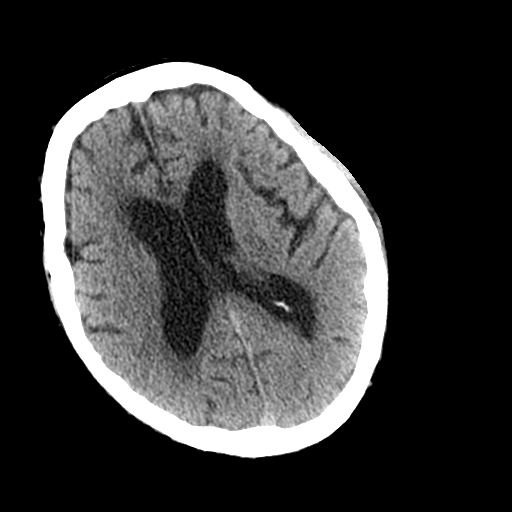
[im 21/29  brain]
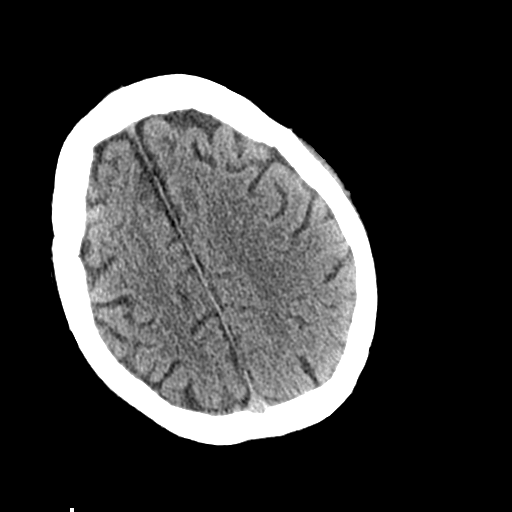
[im 21/29  bone]
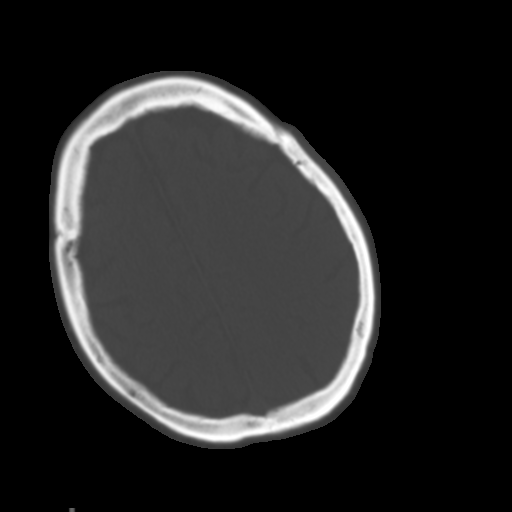
[im 25/29  brain]
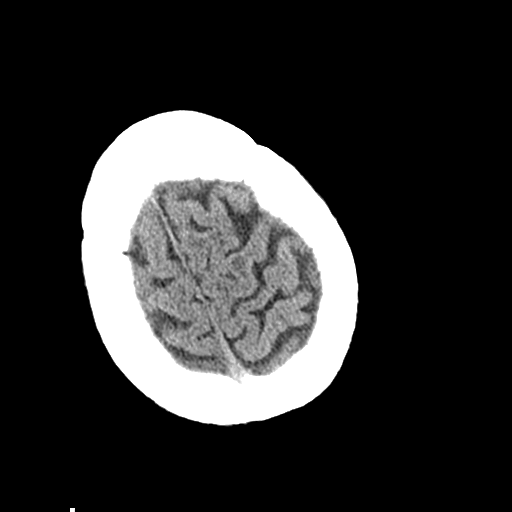

[Series 4: coronal · coronal · 0.36mm/px · 3 of 66 slices shown]
[im 22/66  brain]
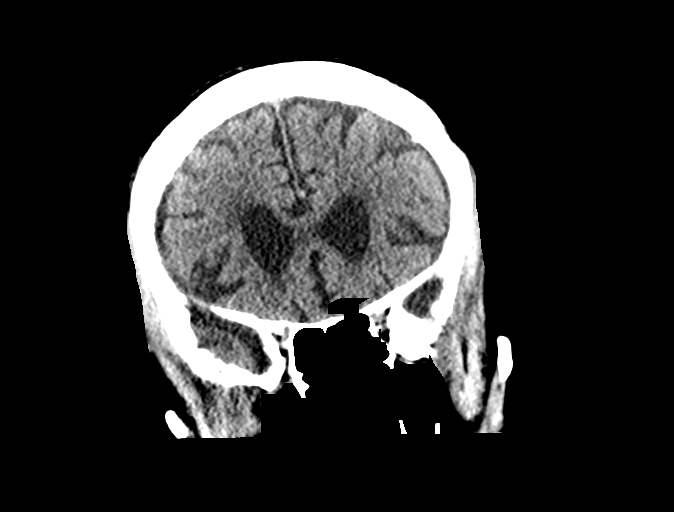
[im 29/66  brain]
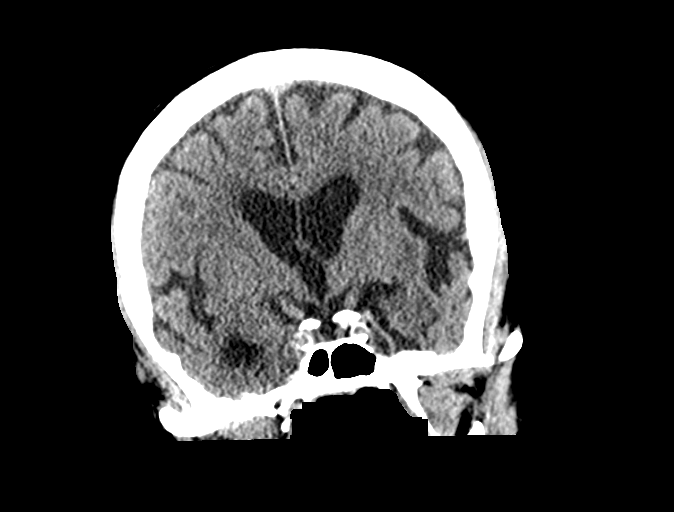
[im 37/66  brain]
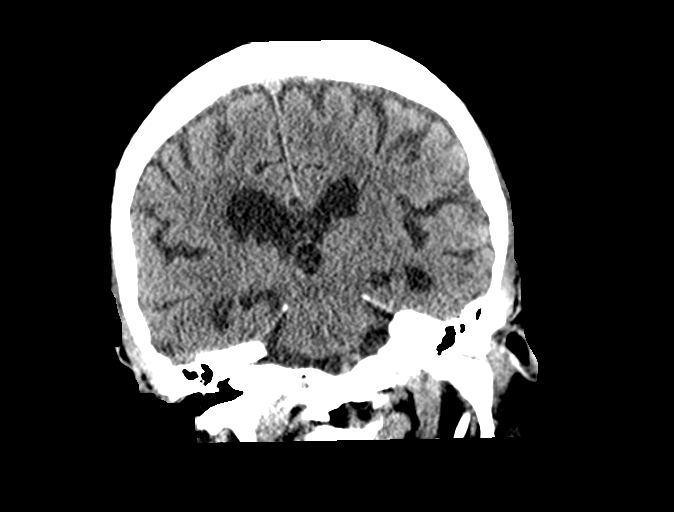

[Series 5: sagittal · sagittal · 0.34mm/px · 3 of 54 slices shown]
[im 21/54  brain]
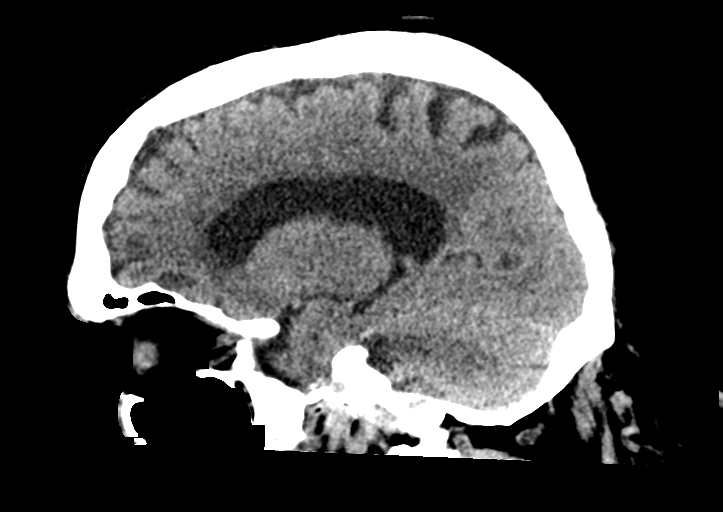
[im 27/54  brain]
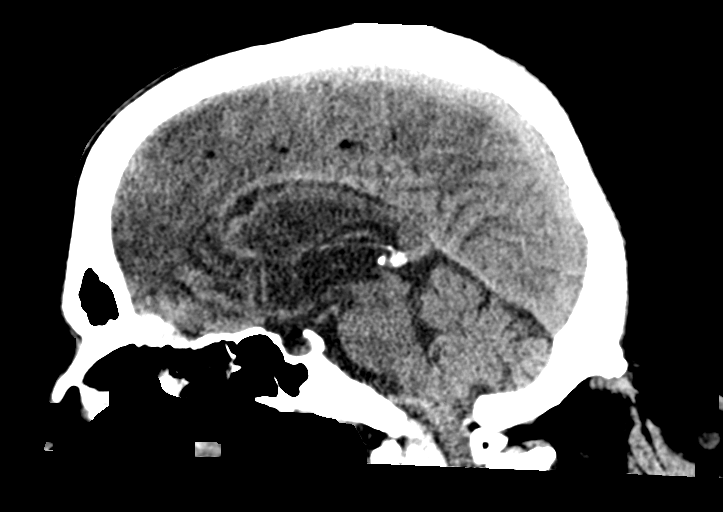
[im 33/54  brain]
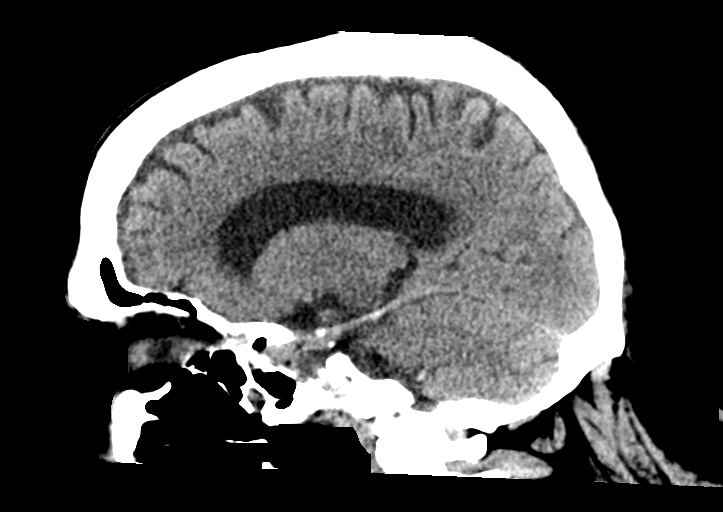

[Series 7: ang.axials · axial · 0.42mm/px · z∈[-217,-185]mm · 3 of 48 slices shown]
[im 4/48  brain]
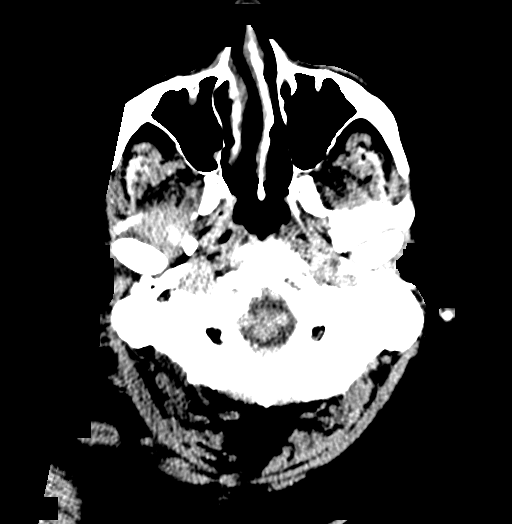
[im 11/48  brain]
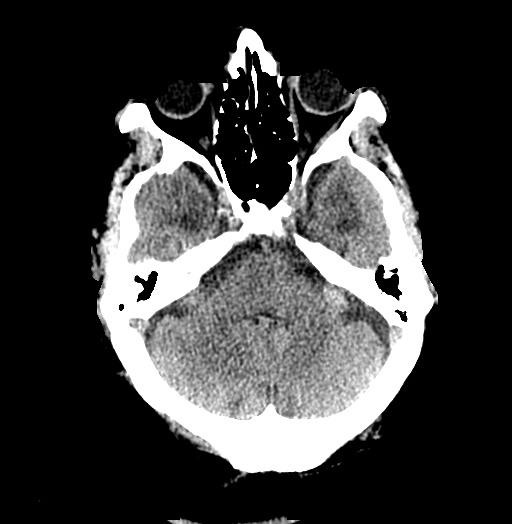
[im 15/48  brain]
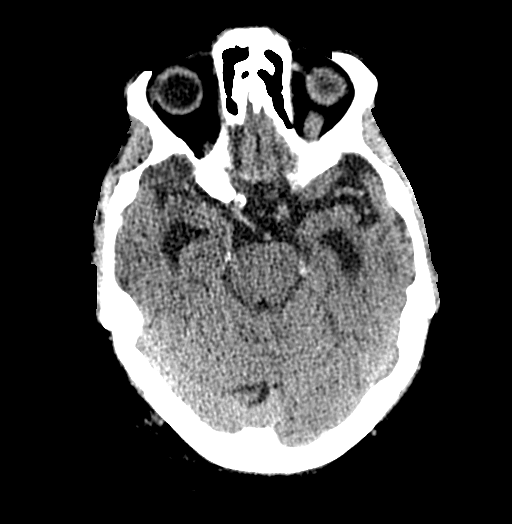

[15 of 47 positions shown; findings below may reference images not displayed]

FINDINGS: Brain: No evidence of acute infarction, hemorrhage, extra-axial
collection, ventriculomegaly, or mass effect. Generalized cerebral
atrophy. Periventricular white matter low attenuation likely
secondary to microangiopathy.

Vascular: Cerebrovascular atherosclerotic calcifications are noted.

Skull: Negative for fracture or focal lesion.

Sinuses/Orbits: Visualized portions of the orbits are unremarkable.
Visualized portions of the paranasal sinuses and mastoid air cells
are unremarkable.

Other: None.
IMPRESSION: 1. No acute intracranial pathology.
2. Chronic microvascular disease and cerebral atrophy.
